# Patient Record
Sex: Male | Born: 1945 | Race: White | Hispanic: No | Marital: Married | Smoking: Never smoker
Health system: Southern US, Community
[De-identification: ages and names within clinical notes are randomized; demographics above are authoritative.]

## PROBLEM LIST (undated history)

## (undated) DIAGNOSIS — E039 Hypothyroidism, unspecified: Secondary | ICD-10-CM

## (undated) DIAGNOSIS — E079 Disorder of thyroid, unspecified: Secondary | ICD-10-CM

## (undated) DIAGNOSIS — M1990 Unspecified osteoarthritis, unspecified site: Secondary | ICD-10-CM

## (undated) DIAGNOSIS — E7800 Pure hypercholesterolemia, unspecified: Secondary | ICD-10-CM

## (undated) DIAGNOSIS — C679 Malignant neoplasm of bladder, unspecified: Secondary | ICD-10-CM

## (undated) DIAGNOSIS — I10 Essential (primary) hypertension: Secondary | ICD-10-CM

---
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
## 1982-08-30 HISTORY — PX: HERNIA REPAIR: SHX51

## 2012-08-30 HISTORY — PX: PENILE PROSTHESIS IMPLANT: SHX240

## 2012-08-30 HISTORY — PX: EYE SURGERY: SHX253

## 2016-08-30 DIAGNOSIS — C801 Malignant (primary) neoplasm, unspecified: Secondary | ICD-10-CM

## 2016-08-30 HISTORY — DX: Malignant (primary) neoplasm, unspecified: C80.1

## 2016-08-30 HISTORY — PX: TRANSURETHRAL RESECTION OF BLADDER TUMOR: SHX2575

## 2019-04-20 LAB — LIPID PANEL
Cholesterol: 152 (ref 0–200)
HDL: 42 (ref 35–70)
LDL Cholesterol: 86
Triglycerides: 138 (ref 40–160)

## 2019-04-20 LAB — HEPATIC FUNCTION PANEL
ALT: 13 (ref 10–40)
AST: 15 (ref 14–40)
Alkaline Phosphatase: 71 (ref 25–125)
Bilirubin, Total: 0.3

## 2019-04-20 LAB — BASIC METABOLIC PANEL
BUN: 17 (ref 4–21)
Creatinine: 1 (ref 0.6–1.3)
Glucose: 100
Potassium: 5.3 (ref 3.4–5.3)
Sodium: 142 (ref 137–147)

## 2019-04-20 LAB — CBC AND DIFFERENTIAL
HCT: 45 (ref 41–53)
Hemoglobin: 14.8 (ref 13.5–17.5)
Platelets: 137 — AB (ref 150–399)
WBC: 5.3

## 2019-04-20 LAB — TSH: TSH: 3.23 (ref 0.41–5.90)

## 2019-04-20 LAB — HEMOGLOBIN A1C: Hemoglobin A1C: 5.8

## 2019-04-26 DIAGNOSIS — N5231 Erectile dysfunction following radical prostatectomy: Secondary | ICD-10-CM | POA: Insufficient documentation

## 2019-04-26 DIAGNOSIS — E669 Obesity, unspecified: Secondary | ICD-10-CM | POA: Insufficient documentation

## 2019-04-26 DIAGNOSIS — E66811 Obesity, class 1: Secondary | ICD-10-CM | POA: Insufficient documentation

## 2019-04-30 NOTE — Progress Notes (Signed)
Uric acid: 6.0

## 2019-05-02 ENCOUNTER — Telehealth: Payer: Self-pay

## 2019-05-02 NOTE — Telephone Encounter (Signed)

## 2019-05-03 ENCOUNTER — Encounter: Payer: Self-pay | Admitting: Family Medicine

## 2019-05-03 ENCOUNTER — Ambulatory Visit (INDEPENDENT_AMBULATORY_CARE_PROVIDER_SITE_OTHER): Payer: Medicare Other | Admitting: Family Medicine

## 2019-05-03 VITALS — BP 130/80 | HR 62 | Ht 72.0 in | Wt 244.0 lb

## 2019-05-03 DIAGNOSIS — D696 Thrombocytopenia, unspecified: Secondary | ICD-10-CM | POA: Insufficient documentation

## 2019-05-03 DIAGNOSIS — Z1211 Encounter for screening for malignant neoplasm of colon: Secondary | ICD-10-CM | POA: Insufficient documentation

## 2019-05-03 DIAGNOSIS — M109 Gout, unspecified: Secondary | ICD-10-CM | POA: Insufficient documentation

## 2019-05-03 DIAGNOSIS — Z23 Encounter for immunization: Secondary | ICD-10-CM | POA: Insufficient documentation

## 2019-05-03 DIAGNOSIS — Z8546 Personal history of malignant neoplasm of prostate: Secondary | ICD-10-CM | POA: Diagnosis not present

## 2019-05-03 NOTE — Progress Notes (Signed)
Established Patient Office Visit  Subjective:  Patient ID: [name], male    DOB: Oct 02, 1945  Age: 73 y.o. MRN: [id number]  CC:  Chief Complaint  Patient presents with  . Establish Care    HPI [name] presents for establishment of care and follow-up for his gout, thrombocytopenia and need for colon cancer screening.  Prostate cancer diagnosed back in 2014 and treated with prostatectomy.  He recurrent covered well.  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  He is scheduled to see a physician in [place] who specializes in this issue.  Patient also has a pump that works well for him.  He has a history of hypertension well-controlled with carvedilol.  History of hypothyroidism controlled with levothyroxine.  He does take this medication in the morning on a fasting stomach.  His physician had ordered labs that I was able to view.  Small drop in the platelets were noted.  Patient has no issues with bleeding.  He is taking allopurinol without issue for his gout.  Has had no gouty attacks since starting this medication.  History reviewed. No pertinent past medical history.  History reviewed. No pertinent surgical history.  History reviewed. No pertinent family history.  Social History   Socioeconomic History  . Marital status: Married    Spouse name: Not on file  . Number of children: Not on file  . Years of education: Not on file  . Highest education level: Not on file  Occupational History  . Not on file  Social Needs  . Financial resource strain: Not on file  . Food insecurity    Worry: Not on file    Inability: Not on file  . Transportation needs    Medical: Not on file    Non-medical: Not on file  Tobacco Use  . Smoking status: Never Smoker  . Smokeless tobacco: Never Used  Substance and Sexual Activity  . Alcohol use: Not on file  . Drug use: Not on file  . Sexual  activity: Not on file  Lifestyle  . Physical activity    Days per week: Not on file    Minutes per session: Not on file  . Stress: Not on file  Relationships  . Social Herbalist on phone: Not on file    Gets together: Not on file    Attends religious service: Not on file    Active member of club or organization: Not on file    Attends meetings of clubs or organizations: Not on file    Relationship status: Not on file  . Intimate partner violence    Fear of current or ex partner: Not on file    Emotionally abused: Not on file    Physically abused: Not on file    Forced sexual activity: Not on file  Other Topics Concern  . Not on file  Social History Narrative  . Not on file    Outpatient Medications Prior to Visit  Medication Sig Dispense Refill  . allopurinol (ZYLOPRIM) 100 MG tablet Take 1 tablet by mouth 2 (two) times daily.    Marland Kitchen aspirin 81 MG chewable tablet Chew 1 tablet by mouth daily.    . carvedilol (COREG) 25 MG tablet Take 1 tablet by mouth 2 (two) times daily.    . Coenzyme Q10 (COQ10 PO) Take by mouth.    . folic acid (FOLVITE) 1 MG tablet  Take 1 tablet by mouth daily.    . indomethacin (INDOCIN) 25 MG capsule Take by mouth as needed.    Marland Kitchen levothyroxine (SYNTHROID) 75 MCG tablet Take 1 tablet by mouth daily.     No facility-administered medications prior to visit.     No Known Allergies  ROS Review of Systems  Constitutional: Negative.   HENT: Negative.   Respiratory: Negative.   Cardiovascular: Negative.   Gastrointestinal: Negative.  Negative for anal bleeding and blood in stool.  Endocrine: Negative for polyphagia and polyuria.  Genitourinary: Negative for decreased urine volume, difficulty urinating and hematuria.  Skin: Negative for pallor and rash.  Allergic/Immunologic: Negative for immunocompromised state.  Hematological: Does not bruise/bleed easily.  Psychiatric/Behavioral: Negative.       Objective:    Physical Exam   Constitutional: He is oriented to person, place, and time. He appears well-developed and well-nourished. No distress.  HENT:  Head: Normocephalic and atraumatic.  Right Ear: External ear normal.  Left Ear: External ear normal.  Mouth/Throat: Oropharynx is clear and moist. No oropharyngeal exudate.  Eyes: Pupils are equal, round, and reactive to light. Conjunctivae are normal. Right eye exhibits no discharge. Left eye exhibits no discharge. No scleral icterus.  Neck: Neck supple. No JVD present. No tracheal deviation present. No thyromegaly present.  Cardiovascular: Normal rate, regular rhythm and normal heart sounds.  Pulmonary/Chest: Effort normal and breath sounds normal. No stridor.  Abdominal: Bowel sounds are normal.  Musculoskeletal:        General: No edema.  Lymphadenopathy:    He has no cervical adenopathy.  Neurological: He is alert and oriented to person, place, and time.  Skin: Skin is warm and dry. He is not diaphoretic.  Psychiatric: He has a normal mood and affect.    BP 130/80   Pulse 62   Ht 6' (1.829 m)   Wt 244 lb (110.7 kg)   SpO2 97%   BMI 33.09 kg/m  Wt Readings from Last 3 Encounters:  05/03/19 244 lb (110.7 kg)   BP Readings from Last 3 Encounters:  05/03/19 130/80   Guideline developer:  UpToDate (see UpToDate for funding source) Date Released: June 2014  Health Maintenance Due  Topic Date Due  . Hepatitis C Screening  Jun 04, 1946  . TETANUS/TDAP  02/02/1965  . COLONOSCOPY  02/03/1996  . PNA vac Low Risk Adult (1 of 2 - PCV13) 02/03/2011    There are no preventive care reminders to display for this patient.  Lab Results  Component Value Date   TSH 3.23 04/20/2019   Lab Results  Component Value Date   WBC 5.3 04/20/2019   HGB 14.8 04/20/2019   HCT 45 04/20/2019   PLT 137 (A) 04/20/2019   Lab Results  Component Value Date   NA 142 04/20/2019   K 5.3 04/20/2019   BUN 17 04/20/2019   CREATININE 1.0 04/20/2019   ALKPHOS 71 04/20/2019    AST 15 04/20/2019   ALT 13 04/20/2019   Lab Results  Component Value Date   CHOL 152 04/20/2019   Lab Results  Component Value Date   HDL 42 04/20/2019   Lab Results  Component Value Date   LDLCALC 86 04/20/2019   Lab Results  Component Value Date   TRIG 138 04/20/2019   No results found for: [facility] Lab Results  Component Value Date   HGBA1C 5.8 04/20/2019      Assessment & Plan:   Problem List Items Addressed This Visit  Other   Thrombocytopenia ([place])   Relevant Orders   CBC   Gout - Primary   Relevant Orders   Uric acid   Screen for colon cancer   Relevant Orders   Ambulatory referral to Gastroenterology   Personal history of prostate cancer   Relevant Orders   PSA   Need for influenza vaccination   Relevant Orders   Flu Vaccine QUAD High Dose(Fluad) (Completed)      No orders of the defined types were placed in this encounter.   Follow-up: Return in about 3 months (around 08/02/2019).

## 2019-05-04 LAB — CBC
HCT: 43.1 % (ref 39.0–52.0)
Hemoglobin: 14.5 g/dL (ref 13.0–17.0)
MCHC: 33.6 g/dL (ref 30.0–36.0)
MCV: 90.5 fl (ref 78.0–100.0)
Platelets: 148 10*3/uL — ABNORMAL LOW (ref 150.0–400.0)
RBC: 4.76 Mil/uL (ref 4.22–5.81)
RDW: 15.5 % (ref 11.5–15.5)
WBC: 5.3 10*3/uL (ref 4.0–10.5)

## 2019-05-04 LAB — URIC ACID: Uric Acid, Serum: 6.4 mg/dL (ref 4.0–7.8)

## 2019-05-04 LAB — PSA: PSA: 0 ng/mL — ABNORMAL LOW (ref 0.10–4.00)

## 2019-05-09 ENCOUNTER — Other Ambulatory Visit: Payer: Self-pay

## 2019-05-09 MED ORDER — LEVOTHYROXINE SODIUM 75 MCG PO TABS: 75.0000 ug | ORAL_TABLET | Freq: Every day | ORAL | 2 refills | Status: DC

## 2019-05-09 MED ORDER — CARVEDILOL 25 MG PO TABS: 25.0000 mg | ORAL_TABLET | Freq: Two times a day (BID) | ORAL | 2 refills | Status: DC

## 2019-05-09 MED ORDER — ALLOPURINOL 100 MG PO TABS: 100.0000 mg | ORAL_TABLET | Freq: Two times a day (BID) | ORAL | 2 refills | Status: DC

## 2019-05-09 MED ORDER — FOLIC ACID 1 MG PO TABS: 1.0000 mg | ORAL_TABLET | Freq: Every day | ORAL | 2 refills | Status: DC

## 2019-05-17 ENCOUNTER — Telehealth: Payer: Self-pay

## 2019-05-17 MED ORDER — ROSUVASTATIN CALCIUM 10 MG PO TABS: 10.0000 mg | ORAL_TABLET | Freq: Every day | ORAL | 1 refills | Status: DC

## 2019-05-17 NOTE — Telephone Encounter (Signed)
Rx changed & sent to [facility] on [address].

## 2019-05-17 NOTE — Telephone Encounter (Signed)
Copied from [place] [id number]. Topic: General - Other >> May 17, 2019  3:40 PM Mcneil, Ja-Kwan wrote: Reason for CRM: Pt stated he needs a new Rx for Rosuvastatin 10 MG that he takes once daily sent to [facility].

## 2019-05-17 NOTE — Telephone Encounter (Signed)
Patient wanting script sent to [place], [place] [address], pharmacy instead. Please advise.

## 2019-06-18 ENCOUNTER — Other Ambulatory Visit: Payer: Self-pay

## 2019-06-18 DIAGNOSIS — Z20822 Contact with and (suspected) exposure to covid-19: Secondary | ICD-10-CM

## 2019-06-20 LAB — NOVEL CORONAVIRUS, NAA: SARS-CoV-2, NAA: NOT DETECTED

## 2019-09-02 ENCOUNTER — Emergency Department (HOSPITAL_COMMUNITY)
Admission: EM | Admit: 2019-09-02 | Discharge: 2019-09-02 | Discharge status: Against Medical Advice | Payer: Medicare Other | Attending: Emergency Medicine | Admitting: Emergency Medicine

## 2019-09-02 ENCOUNTER — Other Ambulatory Visit: Payer: Self-pay

## 2019-09-02 ENCOUNTER — Encounter (HOSPITAL_COMMUNITY): Payer: Self-pay | Admitting: Emergency Medicine

## 2019-09-02 DIAGNOSIS — Z5321 Procedure and treatment not carried out due to patient leaving prior to being seen by health care provider: Secondary | ICD-10-CM | POA: Insufficient documentation

## 2019-09-02 DIAGNOSIS — R319 Hematuria, unspecified: Secondary | ICD-10-CM | POA: Diagnosis present

## 2019-09-02 HISTORY — DX: Pure hypercholesterolemia, unspecified: E78.00

## 2019-09-02 HISTORY — DX: Essential (primary) hypertension: I10

## 2019-09-02 HISTORY — DX: Disorder of thyroid, unspecified: E07.9

## 2019-09-02 NOTE — ED Notes (Signed)
No response for room x3 

## 2019-09-02 NOTE — ED Notes (Signed)
Prior to inserting foley catheter pt was able to void unable able measure or obtain urine sample. Bladder scan preformed post void.

## 2019-09-02 NOTE — ED Notes (Signed)
Pt states he will wait for now in lobby.

## 2019-09-02 NOTE — ED Triage Notes (Signed)
C/o intermittent hematuria and urinary retention x 2 weeks. Worse this morning. Pt has appt with urologist on Friday.

## 2019-09-07 DIAGNOSIS — N3040 Irradiation cystitis without hematuria: Secondary | ICD-10-CM | POA: Insufficient documentation

## 2019-09-28 ENCOUNTER — Ambulatory Visit: Payer: Medicare Other

## 2019-10-05 ENCOUNTER — Encounter (HOSPITAL_BASED_OUTPATIENT_CLINIC_OR_DEPARTMENT_OTHER): Payer: Medicare Other | Admitting: Internal Medicine

## 2019-10-05 ENCOUNTER — Other Ambulatory Visit: Payer: Self-pay

## 2019-10-05 DIAGNOSIS — N3011 Interstitial cystitis (chronic) with hematuria: Secondary | ICD-10-CM | POA: Diagnosis not present

## 2019-10-05 DIAGNOSIS — N3041 Irradiation cystitis with hematuria: Secondary | ICD-10-CM | POA: Insufficient documentation

## 2019-10-05 DIAGNOSIS — Z8546 Personal history of malignant neoplasm of prostate: Secondary | ICD-10-CM | POA: Diagnosis not present

## 2019-10-05 NOTE — Progress Notes (Addendum)
HIRO, VIPOND ([id number]) Visit Report for 10/05/2019 Chief Complaint Document Details Patient Name: Date of Service: [name], [name] 10/05/2019 1:15 PM Medical Record [id number] Patient Account Number: [id number] Date of Birth/Sex: Treating RN: [birth year] (74 y.o. 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 M) Primary Care Provider: [name] Other Clinician: Referring Provider: Treating Provider/Extender:Letetia Romanello, [name], [name] in Treatment: 0 History of Present Illness HPI Description: ADMISSION 10/05/2019 This is a 74 year old man who was diagnosed with prostate cancer in 2000 and underwent a radical prostatectomy. Because of elevated prostate-specific antigen he required salvage radiation in 2004. His radiation therapy was at [facility] in [place] at the time of this dictation I do not have the exact treatment specifications. 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 According to the patient his problem began 60 days ago. He developed gross hematuria and more problematic than that blood clots with urinary retention. He  follows with Dr. Warrick Parisian at [facility] urology. I believe he required Foley catheter insertion for a period of time although in the last few weeks he has not noticed any further bleeding. He is incontinent for the most part during the day and wears incontinence garments. He does not have any dysuria. The patient has been worked up with a CT urogram on 09/20/2019. 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 There is intermediate nonocclusive filling defects within the left mid ureter nonobstructing bilateral renal calculi. The patient has had urodynamic studies he has a overactivity with a decent bladder capacity. The patient's last cystoscopy was on 07/04/2019 which did not show any evidence of stones or foreign bodies. Bilateral ureteral orifices were noted in the usual anatomic position without abnormalities. 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 The patient was also seen by Dr. Zigmund Daniel at the [facility] wound care and hyperbaric center. He was recommended for hyperbaric oxygen therapy over the patient lives in [place] and wishes to come here as it is much closer to his home. The patient does not have any known history of coronary artery disease or significant pulmonary disease. He is a non-smoker. Past medical history includes prostate CA is noted radiation cystitis is noted. hypertension and hyperlipidemia Electronic Signature(s) Signed: 10/05/2019 5:45:44 PM By: [name] MD Entered By: [name] on 10/05/2019 15:46:58 -------------------------------------------------------------------------------- Physical Exam Details Patient Name: Date of Service: [name], [name] 10/05/2019 1:15 PM Medical Record [id number] Patient Account Number: [id number] Date of Birth/Sex: Treating RN: 1945/09/06 (74 y.o. M) Primary Care  Provider: [name] Other Clinician: Referring Provider: Treating Provider/Extender:Tyriana Helmkamp, [name], [name] in Treatment: 0 Constitutional Patient is hypertensive.. Pulse regular and within target range for patient.Marland Kitchen Respirations regular, non-labored and within target range.. Temperature is normal and within the target range for the patient.Marland Kitchen Appears in no distress. Respiratory  work of breathing is normal. Cardiovascular Heart rhythm and rate regular, without murmur or gallop.. Genitourinary (GU) Bladder is not distended. Psychiatric appears at normal baseline. Electronic Signature(s) Signed: 10/05/2019 5:45:44 PM By: [name] MD Entered By: [name] on 10/05/2019 15:43:07 -------------------------------------------------------------------------------- Physician Orders Details Patient Name: Date of Service: [name], [name] 10/05/2019 1:15 PM Medical Record [id number] Patient Account Number: [id number] Date of Birth/Sex: Treating RN: 06-Oct-1945 (74 y.o. [name] Primary Care Provider: [name] Other Clinician: Referring Provider: Treating Provider/Extender:Getsemani Lindon, [name], [name] in Treatment: 0 Verbal / Phone Orders: No Diagnosis Coding Follow-up Appointments Other: - We will contact you to start Hyperbarics once we get approval from insurance Hyperbaric Oxygen Therapy Evaluate for HBO Therapy Indication: - Radiation Cystitis If appropriate for treatment, begin HBOT per protocol: 2.5 ATA for 90 Minutes with 2 Five (5) Minute Air Breaks Total Number of Treatments: - 40 One treatments per day (delivered Monday through Friday unless otherwise specified in Special Instructions below): Antihistamine 30 minutes prior to HBO Treatment, difficulty clearing ears. Electronic Signature(s) Signed: 10/05/2019 5:45:44 PM By: [name] MD Signed: 10/05/2019 5:49:59 PM By: Kela Millin Entered By: Kela Millin on 10/05/2019 14:54:33 -------------------------------------------------------------------------------- Problem List Details Patient Name: Date of Service: [name], [name] 10/05/2019 1:15 PM Medical Record [id number] Patient Account Number: [id number] Date of Birth/Sex: Treating RN: Apr 06, 1946 (74 y.o. M) Primary Care Provider: [name] Other Clinician: Referring Provider: Treating Provider/Extender:Arryanna Holquin, [name], [name] in Treatment: 0 Active Problems ICD-10 Evaluated Encounter Code Description Active Date Today Diagnosis N30.41 Irradiation cystitis with hematuria 10/05/2019 No Yes Z51.0 Encounter for antineoplastic radiation therapy 10/05/2019 No Yes Z85.46 Personal history of malignant neoplasm of prostate 10/05/2019 No Yes Inactive Problems Resolved Problems Electronic Signature(s) Signed: 10/05/2019 5:45:44 PM By: [name] MD Entered By: [name] on 10/05/2019 15:20:54 -------------------------------------------------------------------------------- Progress Note Details Patient Name: Date of Service: [name], [name] 10/05/2019 1:15 PM Medical Record [id number] Patient Account Number: [id number] Date of Birth/Sex: Treating RN: 1946-08-17 (74 y.o. 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 Because of elevated prostate-specific antigen he required salvage radiation in 2004. His radiation therapy was at [facility] in [place] at the time of this dictation I do not have the exact treatment  specifications. 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 According to the patient his problem began 60 days ago. He developed gross hematuria and more problematic than that blood clots with urinary retention. He follows with Dr. Warrick Parisian at [facility] urology. I believe he required Foley catheter insertion for a period of time although in the last few weeks he has not noticed any further bleeding. He is incontinent for the most part during the day and wears incontinence garments. He does not have any dysuria. The patient has been worked up with a CT urogram on 09/20/2019. 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 There is intermediate nonocclusive filling defects within the left mid ureter nonobstructing bilateral renal  calculi. The patient has had urodynamic studies he has a overactivity with a decent bladder capacity. The patient's last cystoscopy was on 07/04/2019 which did not show any evidence of stones or foreign bodies. Bilateral ureteral orifices were noted in the usual anatomic position without abnormalities. 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 The patient was also seen by Dr. Zigmund Daniel at the [facility] wound care and hyperbaric center. He was recommended for hyperbaric oxygen therapy over the patient lives in [place] and wishes to come here as it is much closer to his home. The patient does not have any known history of coronary artery disease or significant pulmonary disease. He is a non-smoker. Past medical history includes prostate CA is noted radiation cystitis is noted. hypertension and hyperlipidemia Patient History Information obtained from  Patient. Allergies No Known Allergies Family History Heart Disease - Father, Stroke - Mother, No family history of Cancer, Hereditary Spherocytosis, Hypertension, Kidney Disease, Lung Disease, Seizures, Thyroid Problems, Tuberculosis. Social History Never smoker, Marital Status - Married, Alcohol Use - Rarely, Caffeine Use - Never. Medical History Eyes Denies history of Cataracts, Glaucoma, Optic Neuritis Ear/Nose/Mouth/Throat Denies history of Chronic sinus problems/congestion, Middle ear problems Hematologic/Lymphatic Denies history of Anemia, Hemophilia, Human Immunodeficiency Virus, Lymphedema, Sickle Cell Disease Respiratory Denies history of Aspiration, Asthma, Chronic Obstructive Pulmonary Disease (COPD), Pneumothorax, Sleep Apnea, Tuberculosis Cardiovascular Patient has history of Hypertension Denies history of Angina, Arrhythmia, Congestive Heart Failure, Coronary Artery Disease, Deep Vein Thrombosis, Hypotension, Myocardial Infarction, Peripheral Arterial Disease, Peripheral Venous Disease, Phlebitis, Vasculitis Gastrointestinal Denies history of Cirrhosis , Colitis, Crohnoos, Hepatitis A, Hepatitis B, Hepatitis C Endocrine Denies history of Type I Diabetes, Type II Diabetes Genitourinary Denies history of End Stage Renal Disease Immunological Denies history of Lupus Erythematosus, Raynaudoos, Scleroderma Integumentary (Skin) Denies history of History of Burn Musculoskeletal Denies history of Gout, Rheumatoid Arthritis, Osteoarthritis, Osteomyelitis Neurologic Denies history of Dementia, Neuropathy, Quadriplegia, Paraplegia, Seizure Disorder Oncologic Patient has history of Received Radiation - 2003 Psychiatric Denies history of Anorexia/bulimia, Confinement Anxiety Review of Systems (ROS) Constitutional Symptoms (General Health) Denies complaints or symptoms of Fatigue, Fever, Chills, Marked Weight Change. Eyes Complains or has symptoms of Glasses /  Contacts. Denies complaints or symptoms of Dry Eyes, Vision Changes. Ear/Nose/Mouth/Throat Denies complaints or symptoms of Chronic sinus problems or rhinitis. Respiratory Denies complaints or symptoms of Chronic or frequent coughs, Shortness of Breath. Cardiovascular Denies complaints or symptoms of Chest pain. Gastrointestinal Denies complaints or symptoms of Frequent diarrhea, Nausea, Vomiting. Endocrine Denies complaints or symptoms of Heat/cold intolerance. Genitourinary Denies complaints or symptoms of Frequent urination. Integumentary (Skin) Denies complaints or symptoms of Wounds. Musculoskeletal Denies complaints or symptoms of Muscle Pain, Muscle Weakness. Neurologic Denies complaints or symptoms of Numbness/parasthesias. Psychiatric Denies complaints or symptoms of Claustrophobia, Suicidal. Objective Constitutional Patient is hypertensive.. Pulse regular and within target range for patient.Marland Kitchen Respirations regular, non-labored and within target range.. Temperature is normal and within the target range for the patient.Marland Kitchen Appears in no distress. Vitals Time Taken: 1:50 PM, Height: 72 in, Source: Stated, Weight: 240 lbs, Source: Stated, BMI: 32.5, Temperature: 98.5 F, Pulse: 60 bpm, Respiratory Rate: 18 breaths/min, Blood Pressure: 159/64 mmHg. Respiratory work of breathing is normal. Cardiovascular Heart rhythm and rate regular, without murmur or gallop.. Genitourinary (GU) Bladder is not distended. Psychiatric appears at normal baseline. Assessment Active Problems ICD-10 Irradiation cystitis with hematuria Encounter for antineoplastic radiation therapy Personal history of malignant neoplasm of prostate HBO Evaluation Hx of  prior radiation This patient has a history of a radical prostatectomy in 2000 for prostate cancer. He developed an elevated PSA and required salvage radiation in 2004. At the time of this dictation we do not have the specifics of his radiation  history which were done in [place]. Location of STRN Patient has radiation cystitis reasonably completely evaluated by his urologist Dr.Matvey Tsivian at [facility]. He has been referred for that purpose for hyperbaric oxygen therapy. Description of symptoms According to the patient he developed gross hematuria about 60 days ago. More problematic than that the he had blood clots with urinary retention. He required Foley catheter insertion for a period of time but that since been removed. The patient does not have any dysuria. He wears incontinence garments for incontinent voiding but that is not a new phenomenon CT scan results CT of the abdomen and pelvis done on 09/06/2019 showed circumferential bladder wall thickening without focality which could be related to radiation cystitis. Also noted nonobstructing bilateral renal calculi Other tests Patient underwent a cystoscopy on 07/04/2019. 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 There was no evidence of any stones or foreign bodies. Bilateral ureteral orifices were in the usual anatomic position with clear yellow urine. Plan of care/Summary This patient had recent onset of gross hematuria with blood clots and urinary obstruction. He has been evaluated with a cystoscopy and CT scan of the abdomen and pelvis [urogram] he has been referred for hyperbaric oxygen. We plan to initiate treatment with 2.5 atm 90-minute treatments with 5-minute air breaks for 40 treatments. Goal of this will be to eliminate gross hematuria and resultant symptoms of urinary obstruction. The patient voids incontinently and wears incontinence garments it is doubtful that we will see any improvement there. Plan Follow-up Appointments: Other: - We will contact you to start Hyperbarics once we get approval from insurance Hyperbaric Oxygen Therapy: Evaluate for HBO Therapy Indication: - Radiation Cystitis If appropriate for treatment, begin HBOT  per protocol: 2.5 ATA for 90 Minutes with 2 Five (5) Minute Air Breaks Total Number of Treatments: - 40 One treatments per day (delivered Monday through Friday unless otherwise specified in Special Instructions below): Antihistamine 30 minutes prior to HBO Treatment, difficulty clearing ears. 1. The patient is a good candidate for hyperbaric oxygen therapy for radiation cystitis of recent onset. He has been reasonably well evaluated with CT urogram and cystoscopy. He does not have another source of his symptomatology. 2. As such I recommend hyperbaric oxygen therapy at 2.5 atm 100% oxygen for 90-minute treatments x40 3. I have discussed possible side effects of hyperbaric oxygen including tympanic membrane barotrauma, minor of refractive issues for vision, rare complications including oxygen toxicity. The patient wishes to proceed 4. Goal of therapy will be eradication of hematuria. The patient voids incontinently so it will be difficult to determine urinary frequency and other lower urinary tract voiding symptomatology. 5. He will need a chest x-ray if he is not had one within the last 6 months. As he has no limitations and no history of coronary artery disease I do not necessarily think an EKG is necessary 6. The patient will continue to be followed closely by urology at [facility]. The consult to initiate hyperbaric oxygen treatment was initiated at their request I spent 35 minutes on the review of available records, face to face evaluation and preparation of this record Electronic Signature(s) Signed: 10/09/2019 5:11:57 PM By: [name] MD Previous Signature: 10/09/2019 8:06:10 AM Version By: [name]  MD Previous Signature: 10/05/2019 5:45:44 PM Version By: [name] MD Entered By: [name] on 10/09/2019 17:11:57 -------------------------------------------------------------------------------- HxROS Details Patient Name: Date of Service: [name], [name] 10/05/2019 1:15  PM Medical Record [id number] Patient Account Number: [id number] Date of Birth/Sex: Treating RN: 1946/07/30 (73 y.o. Oval Linsey Primary Care Provider: [name] Other Clinician: Referring Provider: Treating Provider/Extender:Brizeida Mcmurry, [name], [name] in Treatment: 0 Information Obtained From Patient Constitutional Symptoms (General Health) Complaints and Symptoms: Negative for: Fatigue; Fever; Chills; Marked Weight Change Eyes Complaints and Symptoms: Positive for: Glasses / Contacts Negative for: Dry Eyes; Vision Changes Medical History: Negative for: Cataracts; Glaucoma; Optic Neuritis Ear/Nose/Mouth/Throat Complaints and Symptoms: Negative for: Chronic sinus problems or rhinitis Medical History: Negative for: Chronic sinus problems/congestion; Middle ear problems Respiratory Complaints and Symptoms: Negative for: Chronic or frequent coughs; Shortness of Breath Medical History: Negative for: Aspiration; Asthma; Chronic Obstructive Pulmonary Disease (COPD); Pneumothorax; Sleep Apnea; Tuberculosis Cardiovascular Complaints and Symptoms: Negative for: Chest pain Medical History: Positive for: Hypertension Negative for: Angina; Arrhythmia; Congestive Heart Failure; Coronary Artery Disease; Deep Vein Thrombosis; Hypotension; Myocardial Infarction; Peripheral Arterial Disease; Peripheral Venous Disease; Phlebitis; Vasculitis Gastrointestinal Complaints and Symptoms: Negative for: Frequent diarrhea; Nausea; Vomiting Medical History: Negative for: Cirrhosis ; Colitis; Crohns; Hepatitis A; Hepatitis B; Hepatitis C Endocrine Complaints and Symptoms: Negative for: Heat/cold intolerance Medical History: Negative for: Type I Diabetes; Type II Diabetes Genitourinary Complaints and Symptoms: Negative for: Frequent urination Medical History: Negative for: End Stage Renal Disease Integumentary (Skin) Complaints and Symptoms: Negative for:  Wounds Medical History: Negative for: History of Burn Musculoskeletal Complaints and Symptoms: Negative for: Muscle Pain; Muscle Weakness Medical History: Negative for: Gout; Rheumatoid Arthritis; Osteoarthritis; Osteomyelitis Neurologic Complaints and Symptoms: Negative for: Numbness/parasthesias Medical History: Negative for: Dementia; Neuropathy; Quadriplegia; Paraplegia; Seizure Disorder Psychiatric Complaints and Symptoms: Negative for: Claustrophobia; Suicidal Medical History: Negative for: Anorexia/bulimia; Confinement Anxiety Hematologic/Lymphatic Medical History: Negative for: Anemia; Hemophilia; Human Immunodeficiency Virus; Lymphedema; Sickle Cell Disease Immunological Medical History: Negative for: Lupus Erythematosus; Raynauds; Scleroderma Oncologic Medical History: Positive for: Received Radiation - 2003 Immunizations Pneumococcal Vaccine: Received Pneumococcal Vaccination: No Implantable Devices None Family and Social History Cancer: No; Heart Disease: Yes - Father; Hereditary Spherocytosis: No; Hypertension: No; Kidney Disease: No; Lung Disease: No; Seizures: No; Stroke: Yes - Mother; Thyroid Problems: No; Tuberculosis: No; Never smoker; Marital Status - Married; Alcohol Use: Rarely; Caffeine Use: Never; Financial Concerns: No; Food, Clothing or Shelter Needs: No; Support System Lacking: No; Transportation Concerns: No Electronic Signature(s) Signed: 10/05/2019 5:25:54 PM By: [name] RN Signed: 10/05/2019 5:45:44 PM By: [name] MD Entered By: [name] on 10/05/2019 13:58:17 -------------------------------------------------------------------------------- SuperBill Details Patient Name: Date of Service: [name], [name] 10/05/2019 Medical Record [id number] Patient Account Number: [id number] Date of Birth/Sex: Treating RN: Dec 16, 1945 (74 y.o. [name] Primary Care Provider: [name] Other Clinician: Referring Provider:  Treating Provider/Extender:Zayveon Raschke, [name], [name] in Treatment: 0 Diagnosis Coding ICD-10 Codes Code Description N30.41 Irradiation cystitis with hematuria Z51.0 Encounter for antineoplastic radiation therapy Z85.46 Personal history of malignant neoplasm of prostate Facility Procedures CPT4 Code: [id number] Description: [place] VISIT-LEV 3 EST PT Modifier: Quantity: 1 Physician Procedures CPT4 Code: [id number] Description: WC PHYS LEVEL 3 NEW PT ICD-10 Diagnosis Description N30.41 Irradiation cystitis with hematuria Z51.0 Encounter for antineoplastic radiation therapy Z85.46 Personal history of malignant neoplasm of prost Modifier: ate Quantity: 1 Electronic Signature(s) Signed: 10/05/2019 5:45:44 PM By: [name] MD Entered By: [name] on 10/05/2019 15:45:45

## 2019-10-05 NOTE — Progress Notes (Signed)
[name], [name] ([id number]) Visit Report for 10/05/2019 Abuse/Suicide Risk Screen Details Patient Name: Date of Service: [name], [name] 10/05/2019 1:15 PM Medical Record [id number] Patient Account Number: [id number] Date of Birth/Sex: Treating RN: 05-04-1946 (73 y.o. [name]) [name] Primary Care Shakema Surita: [name] Other Clinician: Referring Quincy Boy: Treating Shavy Beachem/Extender:[name], [name], [name] in Treatment: 0 Abuse/Suicide Risk Screen Items Answer ABUSE RISK SCREEN: Has anyone close to you tried to hurt or harm you recentlyo No Do you feel uncomfortable with anyone in your familyo No Has anyone forced you do things that you didnt want to doo No Electronic Signature(s) Signed: 10/05/2019 5:25:54 PM By: [name] RN Entered By: [name] on 10/05/2019 13:58:28 -------------------------------------------------------------------------------- Activities of Daily Living Details Patient Name: Date of Service: [name], [name] 10/05/2019 1:15 PM Medical Record [id number] Patient Account Number: [id number] Date of Birth/Sex: Treating RN: 29-Jan-1946 (73 y.o. [name]) [name] Primary Care Naleigha Raimondi: [name] Other Clinician: Referring Hovanes Hymas: Treating Boneta Standre/Extender:[name], [name], [name] in Treatment: 0 Activities of Daily Living Items Answer Activities of Daily Living (Please select one for each item) Drive Automobile Completely Able Take Medications Completely Able Use Telephone Completely Able Care for Appearance Completely Able Use Toilet Completely Able Bath / Shower Completely Able Dress Self Completely Able Feed Self Completely Able Walk Completely Able Get In / Out Bed Completely Able Housework Completely Able Prepare Meals Completely Able Handle Money Completely Able Shop for Self Completely Able Electronic Signature(s) Signed: 10/05/2019 5:25:54 PM By: [name] RN Entered By: [name] on 10/05/2019  13:58:52 -------------------------------------------------------------------------------- Education Screening Details Patient Name: Date of Service: [name], [name] 10/05/2019 1:15 PM Medical Record [id number] Patient Account Number: [id number] Date of Birth/Sex: Treating RN: 27-Jul-1946 (73 y.o. [name]) [name] Primary Care Collin Rengel: [name] Other Clinician: Referring Aldeen Riga: Treating Lucrecia Mcphearson/Extender:[name], [name], [name] in Treatment: 0 Learning Preferences/Education Level/Primary Language Learning Preference: Explanation Highest Education Level: College or Above Preferred Language: English Cognitive Barrier Language Barrier: No Translator Needed: No Memory Deficit: No Emotional Barrier: No Cultural/Religious Beliefs Affecting Medical Care: No Physical Barrier Impaired Vision: Yes Glasses Impaired Hearing: No Decreased Hand dexterity: No Knowledge/Comprehension Knowledge Level: High Comprehension Level: High Ability to understand written High instructions: Ability to understand verbal High instructions: Motivation Anxiety Level: Calm Cooperation: Cooperative Education Importance: Acknowledges Need Interest in Health Problems: Asks Questions Perception: Coherent Willingness to Engage in Self- High Management Activities: Readiness to Engage in Self- High Management Activities: Electronic Signature(s) Signed: 10/05/2019 5:25:54 PM By: [name] RN Entered By: [name] on 10/05/2019 13:59:26 -------------------------------------------------------------------------------- Fall Risk Assessment Details Patient Name: Date of Service: [name], [name] 10/05/2019 1:15 PM Medical Record [id number] Patient Account Number: [id number] Date of Birth/Sex: Treating RN: [birth year] (73 y.o. [name]) [name] Primary Care Nema Oatley: [name] Other Clinician: Referring Greg Eckrich: Treating Indira Sorenson/Extender:[name], [name],  [name] in Treatment: 0 Fall Risk Assessment Items Have you had 2 or more falls in the last 12 monthso 0 No Have you had any fall that resulted in injury in the last 12 monthso 0 No FALLS RISK SCREEN History of falling - immediate or within 3 months 0 No Secondary diagnosis (Do you have 2 or more medical diagnoseso) 0 No Ambulatory aid None/bed rest/wheelchair/nurse 0 No Crutches/cane/walker 0 No Furniture 0 No Intravenous therapy Access/Saline/Heparin Lock 0 No Weak (short steps with or without shuffle, stooped but able to lift head 0 No while walking, may seek support from furniture) Impaired (short steps with shuffle, may have difficulty arising from chair, 0 No head down,  impaired balance) Mental Status Oriented to own ability 0 No Overestimates or forgets limitations 0 No Risk Level: Low Risk Score: 0 Electronic Signature(s) Signed: 10/05/2019 5:25:54 PM By: [name] RN Entered By: [name] on 10/05/2019 13:59:35 -------------------------------------------------------------------------------- Nutrition Risk Screening Details Patient Name: Date of Service: [name], [name] 10/05/2019 1:15 PM Medical Record [id number] Patient Account Number: [id number] Date of Birth/Sex: Treating RN: 09/22/1945 (73 y.o. [name]) [name] Primary Care Sallye Lunz: [name] Other Clinician: Referring Lani Mendiola: Treating Jacob Chamblee/Extender:[name], [name], [name] in Treatment: 0 Height (in): 72 Weight (lbs): 240 Body Mass Index (BMI): 32.5 Nutrition Risk Screening Items Score Screening NUTRITION RISK SCREEN: I have an illness or condition that made me change the kind and/or 0 No amount of food I eat I eat fewer than two meals per day 0 No I eat few fruits and vegetables, or milk products 0 No I have three or more drinks of beer, liquor or wine almost every day 0 No I have tooth or mouth problems that make it hard for me to eat 0 No I don't always have enough  money to buy the food I need 0 No I eat alone most of the time 0 No I take three or more different prescribed or over-the-counter drugs a day 1 Yes 0 No Without wanting to, I have lost or gained 10 pounds in the last six months I am not always physically able to shop, cook and/or feed myself 0 No Nutrition Protocols Good Risk Protocol 0 No interventions needed Moderate Risk Protocol High Risk Proctocol Risk Level: Good Risk Score: 1 Electronic Signature(s) Signed: 10/05/2019 5:25:54 PM By: [name] RN Entered By: [name] on 10/05/2019 14:00:10

## 2019-10-05 NOTE — Progress Notes (Signed)
[name], [name] ([id number]) Visit Report for 10/05/2019 Allergy List Details Patient Name: Date of Service: [name], [name] 10/05/2019 1:15 PM Medical Record [id number] Patient Account Number: [id number] Date of Birth/Sex: Treating RN: 08-24-1946 (73 y.o. [name]) [name] Primary Care Nanci Lakatos: [name] Other Clinician: Referring Nocole Zammit: Treating Zacheriah Stumpe/Extender:[name], [name], [name] in Treatment: 0 Allergies Active Allergies No Known Allergies Allergy Notes Electronic Signature(s) Signed: 10/05/2019 5:25:54 PM By: [name] RN Entered By: [name] on 10/05/2019 13:51:17 -------------------------------------------------------------------------------- [place] Details Patient Name: Date of Service: [name], [name] 10/05/2019 1:15 PM Medical Record [id number] Patient Account Number: [id number] Date of Birth/Sex: Treating RN: 14-Feb-1946 (73 y.o. [name]) [name] Primary Care Darcella Shiffman: [name] Other Clinician: Referring Reginaldo Hazard: Treating Ausencio Vaden/Extender:[name], [name], [name] in Treatment: 0 Visit Information Patient Arrived: Ambulatory Arrival Time: 13:49 Accompanied By: self Transfer Assistance: None Patient Identification Verified: Yes Secondary Verification Process Completed: Yes Patient Requires Transmission-Based No Precautions: Patient Has Alerts: No Electronic Signature(s) Signed: 10/05/2019 5:25:54 PM By: [name] RN Entered By: [name] on 10/05/2019 13:50:25 -------------------------------------------------------------------------------- Clinic Level of Care Assessment Details Patient Name: Date of Service: [name], [name] 10/05/2019 1:15 PM Medical Record [id number] Patient Account Number: [id number] Date of Birth/Sex: Treating RN: [birth year] (74 y.o. [name] Primary Care Breccan Galant: [name] Other Clinician: Referring Charnette Younkin: Treating Dyshawn Cangelosi/Extender:[name],  [name], [name] in Treatment: 0 Clinic Level of Care Assessment Items TOOL 4 Quantity Score X - Use when only an EandM is performed on FOLLOW-UP visit 1 0 ASSESSMENTS - Nursing Assessment / Reassessment X - Reassessment of Co-morbidities (includes updates in patient status) 1 10 X - Reassessment of Adherence to Treatment Plan 1 5 ASSESSMENTS - Wound and Skin Assessment / Reassessment []  - Simple Wound Assessment / Reassessment - one wound 0 []  - Complex Wound Assessment / Reassessment - multiple wounds 0 []  - Dermatologic / Skin Assessment (not related to wound area) 0 ASSESSMENTS - Focused Assessment []  - Circumferential Edema Measurements - multi extremities 0 []  - Nutritional Assessment / Counseling / Intervention 0 []  - Lower Extremity Assessment (monofilament, tuning fork, pulses) 0 []  - Peripheral Arterial Disease Assessment (using hand held doppler) 0 ASSESSMENTS - Ostomy and/or Continence Assessment and Care X - Incontinence Assessment and Management 1 10 []  - Ostomy Care Assessment and Management (repouching, etc.) 0 PROCESS - Coordination of Care []  - Simple Patient / Family Education for ongoing care 0 X - Complex (extensive) Patient / Family Education for ongoing care 1 20 X - Staff obtains Programmer, systems, Records, Test Results / Process Orders 1 10 []  - Staff telephones HHA, Nursing Homes / Clarify orders / etc 0 []  - Routine Transfer to another Facility (non-emergent condition) 0 []  - Routine Hospital Admission (non-emergent condition) 0 X - New Admissions / Biomedical engineer / Ordering NPWT, Apligraf, etc. 1 15 []  - Emergency Hospital Admission (emergent condition) 0 []  - Simple Discharge Coordination 0 X - Complex (extensive) Discharge Coordination 1 15 PROCESS - Special Needs []  - Pediatric / Minor Patient Management 0 []  - Isolation Patient Management 0 []  - Hearing / Language / Visual special needs 0 []  - Assessment of Community assistance  (transportation, D/C planning, etc.) 0 []  - Additional assistance / Altered mentation 0 []  - Support Surface(s) Assessment (bed, cushion, seat, etc.) 0 INTERVENTIONS - Wound Cleansing / Measurement []  - Simple Wound Cleansing - one wound 0 []  - Complex Wound Cleansing - multiple wounds 0 []  - Wound Imaging (photographs - any number of wounds) 0 []  -  Wound Tracing (instead of photographs) 0 []  - Simple Wound Measurement - one wound 0 []  - Complex Wound Measurement - multiple wounds 0 INTERVENTIONS - Wound Dressings []  - Small Wound Dressing one or multiple wounds 0 []  - Medium Wound Dressing one or multiple wounds 0 []  - Large Wound Dressing one or multiple wounds 0 []  - Application of Medications - topical 0 []  - Application of Medications - injection 0 INTERVENTIONS - Miscellaneous []  - External ear exam 0 []  - Specimen Collection (cultures, biopsies, blood, body fluids, etc.) 0 []  - Specimen(s) / Culture(s) sent or taken to Lab for analysis 0 []  - Patient Transfer (multiple staff / Civil Service fast streamer / Similar devices) 0 []  - Simple Staple / Suture removal (25 or less) 0 []  - Complex Staple / Suture removal (26 or more) 0 []  - Hypo / Hyperglycemic Management (close monitor of Blood Glucose) 0 []  - Ankle / Brachial Index (ABI) - do not check if billed separately 0 X - Vital Signs 1 5 Has the patient been seen at the hospital within the last three years: Yes Total Score: 90 Level Of Care: New/Established - Level 3 Electronic Signature(s) Signed: 10/05/2019 5:49:59 PM By: Kela Millin Entered By: Kela Millin on 10/05/2019 14:48:08 -------------------------------------------------------------------------------- Multi-Disciplinary Care Plan Details Patient Name: Date of Service: [name], [name] 10/05/2019 1:15 PM Medical Record [id number] Patient Account Number: [id number] Date of Birth/Sex: Treating RN: 1946/02/05 (74 y.o. [name] Primary Care Desirie Minteer: [name] Other Clinician: Referring Misha Antonini: Treating Marlyss Cissell/Extender:[name], [name], [name] in Treatment: 0 Active Inactive HBO Nursing Diagnoses: Discomfort related to temperature and humidity changes inside hyperbaric chamber Potential for barotraumas to ears, sinuses, teeth, and lungs or cerebral gas embolism related to changes in atmospheric pressure inside hyperbaric oxygen chamber Potential for oxygen toxicity seizures related to delivery of 100% oxygen at an increased atmospheric pressure Goals: Patient will tolerate the hyperbaric oxygen therapy treatment Date Initiated: 10/05/2019 Target Resolution Date: 11/09/2019 Goal Status: Active Patient/caregiver will verbalize understanding of HBO goals, rationale, procedures and potential hazards Date Initiated: 10/05/2019 Target Resolution Date: 11/09/2019 Goal Status: Active Interventions: Assess for signs and symptoms related to adverse events, including but not limited to confinement anxiety, pneumothorax, oxygen toxicity and baurotrauma Assess patient's knowledge and expectations regarding hyperbaric medicine and provide education related to the hyperbaric environment, goals of treatment and prevention of adverse events Notes: Electronic Signature(s) Signed: 10/05/2019 5:49:59 PM By: Kela Millin Entered By: Kela Millin on 10/05/2019 13:58:53 -------------------------------------------------------------------------------- Patient/Caregiver Education Details Patient Name: Date of Service: [name] 2/5/2021andnbsp1:15 PM Medical Record [id number] Patient Account Number: [id number] Date of Birth/Gender: Treating RN: Jan 04, 1946 (74 y.o. [name] Primary Care Physician: [name] Other Clinician: Referring Physician: Treating Physician/Extender:[name], [name], [name] in Treatment: 0 Education Assessment Education Provided To: Patient Education Topics  Provided Hyperbaric Oxygenation: Methods: Demonstration, Explain/Verbal Responses: State content correctly Electronic Signature(s) Signed: 10/05/2019 5:49:59 PM By: Kela Millin Entered By: Kela Millin on 10/05/2019 14:45:09 -------------------------------------------------------------------------------- Vitals Details Patient Name: Date of Service: KESHONE, YETTER 10/05/2019 1:15 PM Medical Record [id number] Patient Account Number: [id number] Date of Birth/Sex: Treating RN: 1946/03/31 (73 y.o. [name]) [name] Primary Care Eston Heslin: [name] Other Clinician: Referring Dajah Fischman: Treating Lesia Monica/Extender:[name], [name], [name] in Treatment: 0 Vital Signs Time Taken: 13:50 Temperature (F): 98.5 Height (in): 72 Pulse (bpm): 60 Source: Stated Respiratory Rate (breaths/min): 18 Weight (lbs): 240 Blood Pressure (mmHg): 159/64 Source: Stated Reference Range: 80 - 120 mg / dl Body Mass Index (BMI): 32.5  Electronic Signature(s) Signed: 10/05/2019 5:25:54 PM By: [name] RN Entered By: [name] on 10/05/2019 13:51:07

## 2019-10-06 ENCOUNTER — Ambulatory Visit: Payer: Medicare Other | Attending: Internal Medicine

## 2019-10-06 DIAGNOSIS — Z23 Encounter for immunization: Secondary | ICD-10-CM | POA: Insufficient documentation

## 2019-10-06 NOTE — Progress Notes (Signed)
   Covid-19 Vaccination Clinic  Name:  [name]    MRN: [id number] DOB: 09/09/1945  10/06/2019  [name] was observed post Covid-19 immunization for 15 minutes without incidence. He was provided with Vaccine Information Sheet and instruction to access the V-Safe system.   [name] was instructed to call 911 with any severe reactions post vaccine: Marland Kitchen Difficulty breathing  . Swelling of your face and throat  . A fast heartbeat  . A bad rash all over your body  . Dizziness and weakness    Immunizations Administered    Name Date Dose VIS Date Route   Pfizer COVID-19 Vaccine 10/06/2019  5:11 PM 0.3 mL 08/10/2019 Intramuscular   Manufacturer: [place]   Lot: [id number]   [place]: [id number]

## 2019-10-11 ENCOUNTER — Ambulatory Visit
Admission: RE | Admit: 2019-10-11 | Discharge: 2019-10-11 | Disposition: A | Discharge status: Home / Self Care | Payer: Medicare Other | Source: Ambulatory Visit | Attending: Internal Medicine | Admitting: Internal Medicine

## 2019-10-11 ENCOUNTER — Other Ambulatory Visit: Payer: Self-pay

## 2019-10-11 DIAGNOSIS — N3041 Irradiation cystitis with hematuria: Secondary | ICD-10-CM

## 2019-10-12 ENCOUNTER — Ambulatory Visit (INDEPENDENT_AMBULATORY_CARE_PROVIDER_SITE_OTHER): Payer: Medicare Other | Admitting: Family Medicine

## 2019-10-12 ENCOUNTER — Encounter: Payer: Self-pay | Admitting: Family Medicine

## 2019-10-12 VITALS — BP 122/60 | HR 58 | Temp 96.2°F | Ht 72.0 in | Wt 248.6 lb

## 2019-10-12 DIAGNOSIS — E039 Hypothyroidism, unspecified: Secondary | ICD-10-CM | POA: Diagnosis not present

## 2019-10-12 DIAGNOSIS — B351 Tinea unguium: Secondary | ICD-10-CM | POA: Diagnosis not present

## 2019-10-12 DIAGNOSIS — D696 Thrombocytopenia, unspecified: Secondary | ICD-10-CM | POA: Diagnosis not present

## 2019-10-12 DIAGNOSIS — M109 Gout, unspecified: Secondary | ICD-10-CM | POA: Diagnosis not present

## 2019-10-12 DIAGNOSIS — E785 Hyperlipidemia, unspecified: Secondary | ICD-10-CM | POA: Insufficient documentation

## 2019-10-12 DIAGNOSIS — E7800 Pure hypercholesterolemia, unspecified: Secondary | ICD-10-CM

## 2019-10-12 LAB — CBC
HCT: 41.3 % (ref 38.5–50.0)
Hemoglobin: 14 g/dL (ref 13.2–17.1)
MCH: 30.6 pg (ref 27.0–33.0)
MCHC: 33.9 g/dL (ref 32.0–36.0)
MCV: 90.4 fL (ref 80.0–100.0)
MPV: 12.8 fL — ABNORMAL HIGH (ref 7.5–12.5)
Platelets: 140 10*3/uL (ref 140–400)
RBC: 4.57 10*6/uL (ref 4.20–5.80)
RDW: 14.1 % (ref 11.0–15.0)
WBC: 5 10*3/uL (ref 3.8–10.8)

## 2019-10-12 NOTE — Progress Notes (Signed)
Established Patient Office Visit  Subjective:  Patient ID: [name], male    DOB: 1946/03/27  Age: 74 y.o. MRN: [id number]  CC:  Chief Complaint  Patient presents with  . Follow-up    medicine refills and possible bloodwork, tdap    HPI [name] presents for follow-up of his thrombocytopenia, gout, hypothyroidism.  Gout has been well controlled with low-dose allopurinol.  He has had no other attacks.  He is taking his levothyroxine in the morning an hour before eating.  Seen urology specialist in [place] for chronic hematuria associated with radiation cystitis.  Getting ready to start 5 weeks of daily hyper Barrick therapy for his bladder.  Podiatry had diagnosed onychomycosis and let him know that there would be a treatment for it.  Status post first Covid vaccine.  Past Medical History:  Diagnosis Date  . High cholesterol   . Hypertension   . Thyroid disease     History reviewed. No pertinent surgical history.  History reviewed. No pertinent family history.  Social History   Socioeconomic History  . Marital status: Married    Spouse name: Not on file  . Number of children: Not on file  . Years of education: Not on file  . Highest education level: Not on file  Occupational History  . Not on file  Tobacco Use  . Smoking status: Never Smoker  . Smokeless tobacco: Never Used  Substance and Sexual Activity  . Alcohol use: Not Currently  . Drug use: Never  . Sexual activity: Not on file  Other Topics Concern  . Not on file  Social History Narrative  . Not on file   Social Determinants of Health   Financial Resource Strain:   . Difficulty of Paying Living Expenses: Not on file  Food Insecurity:   . Worried About Charity fundraiser in the Last Year: Not on file  . Ran Out of Food in the Last Year: Not on file  Transportation Needs:   . Lack of Transportation (Medical): Not on file  . Lack of Transportation (Non-Medical): Not on file  Physical Activity:     . Days of Exercise per Week: Not on file  . Minutes of Exercise per Session: Not on file  Stress:   . Feeling of Stress : Not on file  Social Connections:   . Frequency of Communication with Friends and Family: Not on file  . Frequency of Social Gatherings with Friends and Family: Not on file  . Attends Religious Services: Not on file  . Active Member of Clubs or Organizations: Not on file  . Attends Archivist Meetings: Not on file  . Marital Status: Not on file  Intimate Partner Violence:   . Fear of Current or Ex-Partner: Not on file  . Emotionally Abused: Not on file  . Physically Abused: Not on file  . Sexually Abused: Not on file    Outpatient Medications Prior to Visit  Medication Sig Dispense Refill  . allopurinol (ZYLOPRIM) 100 MG tablet Take 1 tablet (100 mg total) by mouth 2 (two) times daily. 180 tablet 2  . aspirin 81 MG chewable tablet Chew 1 tablet by mouth daily.    . carvedilol (COREG) 25 MG tablet Take 1 tablet (25 mg total) by mouth 2 (two) times daily. 180 tablet 2  . Coenzyme Q10 (COQ10 PO) Take by mouth.    . folic acid (FOLVITE) 1 MG tablet Take 1 tablet (1 mg total) by mouth daily. [place]  tablet 2  . indomethacin (INDOCIN) 25 MG capsule Take by mouth as needed.    Marland Kitchen levothyroxine (SYNTHROID) 75 MCG tablet Take 1 tablet (75 mcg total) by mouth daily. 90 tablet 2  . rosuvastatin (CRESTOR) 10 MG tablet Take 1 tablet (10 mg total) by mouth daily. 90 tablet 1   No facility-administered medications prior to visit.    No Known Allergies  ROS Review of Systems  Constitutional: Negative.   Respiratory: Negative.   Cardiovascular: Negative.   Gastrointestinal: Negative.   Genitourinary: Negative for hematuria.  Neurological: Negative for light-headedness.  Psychiatric/Behavioral: Negative.       Objective:    Physical Exam  Constitutional: He is oriented to person, place, and time. He appears well-developed and well-nourished. No distress.   HENT:  Head: Normocephalic and atraumatic.  Right Ear: External ear normal.  Left Ear: External ear normal.  Eyes: Conjunctivae are normal. Right eye exhibits no discharge. Left eye exhibits no discharge. No scleral icterus.  Pulmonary/Chest: Effort normal.  Neurological: He is alert and oriented to person, place, and time.  Skin: Skin is warm and dry. He is not diaphoretic.  Psychiatric: He has a normal mood and affect. His behavior is normal.    BP 122/60   Pulse (!) 58   Temp (!) 96.2 F (35.7 C) (Tympanic)   Ht 6' (1.829 m)   Wt 248 lb 9.6 oz (112.8 kg)   SpO2 96%   BMI 33.72 kg/m  Wt Readings from Last 3 Encounters:  10/12/19 248 lb 9.6 oz (112.8 kg)  05/03/19 244 lb (110.7 kg)     Health Maintenance Due  Topic Date Due  . Hepatitis C Screening  09-04-1945  . TETANUS/TDAP  02/02/1965  . PNA vac Low Risk Adult (1 of 2 - PCV13) 02/03/2011    There are no preventive care reminders to display for this patient.  Lab Results  Component Value Date   TSH 3.23 04/20/2019   Lab Results  Component Value Date   WBC 5.3 05/03/2019   HGB 14.5 05/03/2019   HCT 43.1 05/03/2019   MCV 90.5 05/03/2019   PLT 148.0 (L) 05/03/2019   Lab Results  Component Value Date   NA 142 04/20/2019   K 5.3 04/20/2019   BUN 17 04/20/2019   CREATININE 1.0 04/20/2019   ALKPHOS 71 04/20/2019   AST 15 04/20/2019   ALT 13 04/20/2019   Lab Results  Component Value Date   CHOL 152 04/20/2019   Lab Results  Component Value Date   HDL 42 04/20/2019   Lab Results  Component Value Date   LDLCALC 86 04/20/2019   Lab Results  Component Value Date   TRIG 138 04/20/2019   No results found for: CHOLHDL Lab Results  Component Value Date   HGBA1C 5.8 04/20/2019      Assessment & Plan:   Problem List Items Addressed This Visit      Endocrine   Hypothyroidism     Musculoskeletal and Integument   Onychomycosis     Other   Thrombocytopenia (HCC)   Relevant Orders   CBC    Gout - Primary   Elevated cholesterol      No orders of the defined types were placed in this encounter.   Follow-up: Return in about 3 months (around 01/09/2020).  Return in 3 months we will check his cholesterol, allopurinol, TSH and discuss possible treatment of onychomycosis.  Libby Maw, MD

## 2019-10-15 ENCOUNTER — Ambulatory Visit: Payer: Medicare Other

## 2019-10-15 ENCOUNTER — Encounter (HOSPITAL_BASED_OUTPATIENT_CLINIC_OR_DEPARTMENT_OTHER): Payer: Medicare Other | Attending: Internal Medicine | Admitting: Internal Medicine

## 2019-10-15 ENCOUNTER — Other Ambulatory Visit: Payer: Self-pay

## 2019-10-15 DIAGNOSIS — N3041 Irradiation cystitis with hematuria: Secondary | ICD-10-CM | POA: Diagnosis not present

## 2019-10-15 NOTE — Progress Notes (Signed)
[name], [name] ([id number]) Visit Report for 10/15/2019 Arrival Information Details Patient Name: Date of Service: [name], [name] 10/15/2019 8:00 AM Medical Record [id number] Patient Account Number: [id number] Date of Birth/Sex: Treating RN: 01-Nov-1945 (74 y.o. [name], [name] Primary Care [name]: [name] Other Clinician: Mikeal [name] Referring [name]: Treating [name] in Treatment: 1 Visit Information History Since Last Visit Added or deleted any medications: No Patient Arrived: Ambulatory Any new allergies or adverse reactions: No Arrival Time: 07:45 Had a fall or experienced change in No Accompanied By: self activities of daily living that may affect Transfer Assistance: None risk of falls: Patient Identification Verified: Yes Signs or symptoms of abuse/neglect since last No Secondary Verification Process Yes visito Completed: Hospitalized since last visit: No Patient Requires Transmission-Based No Implantable device outside of the clinic excluding No Precautions: cellular tissue based products placed in the center Patient Has Alerts: No since last visit: Pain Present Now: No Electronic Signature(s) Signed: 10/15/2019 4:24:03 PM By: [name] EMT/HBOT Entered By: [name] on 10/15/2019 09:12:55 -------------------------------------------------------------------------------- Encounter Discharge Information Details Patient Name: Date of Service: [name], [name] 10/15/2019 8:00 AM Medical Record [id number] Patient Account Number: [id number] Date of Birth/Sex: Treating RN: 11-Mar-1946 (74 y.o. [name] Primary Care [name]: [name] Other Clinician: Mikeal [name] Referring [name]: Treating [name]/Extender:[name], [place] [name] in Treatment: 1 Encounter Discharge Information Items Discharge Condition: Stable Ambulatory Status: Ambulatory Discharge  Destination: Home Transportation: Private Auto Accompanied By: self Schedule Follow-up Appointment: Yes Clinical Summary of Care: Patient Declined Electronic Signature(s) Signed: 10/15/2019 4:24:03 PM By: [name] EMT/HBOT Entered By: [name] on 10/15/2019 11:33:31 -------------------------------------------------------------------------------- Patient/Caregiver Education Details Patient Name: Date of Service: [name], [name] 2/15/2021andnbsp8:00 AM Medical Record [id number] Patient Account Number: [id number] Date of Birth/Gender: Treating RN: Oct 13, 1945 (73 y.o. [name] Primary Care Physician: [name] Other Clinician: Mikeal [name] Referring Physician: Treating Physician/Extender:[name], [place] [name] in Treatment: 1 Education Assessment Education Provided To: Patient Education Topics Provided Hyperbaric Oxygenation: Methods: Explain/Verbal Responses: State content correctly Electronic Signature(s) Signed: 10/15/2019 4:24:03 PM By: [name] EMT/HBOT Entered By: [name] on 10/15/2019 11:33:07 -------------------------------------------------------------------------------- Vitals Details Patient Name: Date of Service: [name] 10/15/2019 8:00 AM Medical Record [id number] Patient Account Number: [id number] Date of Birth/Sex: Treating RN: 11-20-1945 (74 y.o. [name] Primary Care Dylin Ihnen: [name] Other Clinician: Mikeal [name] Referring Tanayah Squitieri: Treating Reef Achterberg/Extender:[name], [place] [name] in Treatment: 1 Vital Signs Time Taken: 07:50 Temperature (F): 98.2 Height (in): 72 Pulse (bpm): 53 Weight (lbs): 240 Respiratory Rate (breaths/min): 16 Body Mass Index (BMI): 32.5 Blood Pressure (mmHg): 114/91 Reference Range: 80 - 120 mg / dl Electronic Signature(s) Signed: 10/15/2019 4:24:03 PM By: [name] EMT/HBOT Entered By: [name] on 10/15/2019  09:13:16

## 2019-10-15 NOTE — Progress Notes (Signed)
DONYA, SHAHAN ([id number]) Visit Report for 10/15/2019 SuperBill Details Patient Name: Date of Service: [name], [name] 10/15/2019 Medical Record [id number] Patient Account Number: [id number] Date of Birth/Sex: Treating RN: 03-16-1946 (74 y.o. 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

## 2019-10-15 NOTE — Progress Notes (Addendum)
[name], [name] ([id number]) Visit Report for 10/15/2019 HBO Details Patient Name: Date of Service: [name], [name] 10/15/2019 8:00 AM Medical Record [id number] Patient Account Number: [id number] Date of Birth/Sex: Treating RN: [birth year] (74 y.o. [name] Primary Care Stephenson Cichy: [name] Other Clinician: [name] Referring Synethia Endicott: Treating Haylei Cobin/Extender:[name], [name], [name] HBO Treatment Course Details Treatment Course Number: 1 Ordering Vihan Santagata: [name] Total Treatments Ordered: 40 HBO Treatment Start Date: 10/15/2019 HBO Indication: Late Effect of Radiation HBO Treatment Details Treatment Number: 1 Patient Type: Outpatient Chamber Type: Monoplace Chamber Serial #: [id number] Treatment Protocol: 2.5 ATA with 90 minutes oxygen, with two 5 minute air breaks Treatment Details Compression Rate Down: 1.0 psi / minute De-Compression Rate Up: 2.0 psi / minute Air breaks and CompressTx Pressure breathing periods DecompressDecompress Begins Reached (leave unused spaces Begins Ends blank) Chamber Pressure (ATA)1 2.5 2.5 2.5 2.5 2.5 --2.5 1 Clock Time (24 hr) 08:12 08:27 [id number] 10:19 Treatment Length: 127 (minutes) Treatment Segments: 4 Vital Signs Capillary Blood Glucose Reference Range: 80 - 120 mg / dl HBO Diabetic Blood Glucose Intervention Range: <131 mg/dl or >249 mg/dl Time Vitals Blood Respiratory Capillary Blood Glucose Pulse Action Type: Pulse: Temperature: Taken: Pressure: Rate: Glucose (mg/dl): Meter #: Oximetry (%) Taken: Pre 07:50 114/91 53 16 98.2 Post 10:21 158/79 57 15 98.5 Treatment Response Treatment Toleration: Well Treatment Completion Treatment Completed without Adverse Event Status: Keeven Matty Notes The patient tolerates hyperbarics well. His first dive today. Physical exam clear air entry bilaterally. Cardiac heart sounds were normal. He had some cerumen in the right  ear that precluded visualization of the tympanic membrane but the left ear was clear. Again he had no issues Physician HBO Attestation: I certify that I supervised this HBO treatment in accordance with Medicare guidelines. A trained Yes emergency response team is readily available per hospital policies and procedures. Continue HBOT as ordered. Yes Electronic Signature(s) Signed: 10/15/2019 6:16:05 PM By: [name] MD Previous Signature: 10/15/2019 4:24:03 PM Version By: [name] EMT/HBOT Entered By: [name] on 10/15/2019 18:12:32 -------------------------------------------------------------------------------- HBO Safety Checklist Details Patient Name: Date of Service: [name], [name] 10/15/2019 8:00 AM Medical Record [id number] Patient Account Number: [id number] Date of Birth/Sex: Treating RN: Mar 04, 1946 (74 y.o. [name], [name] Primary Care Jarvis Knodel: [name] Other Clinician: [name] Referring Neamiah Sciarra: Treating Martika Egler/Extender:[name], [name], [name] HBO Safety Checklist Items Safety Checklist Consent Form Signed Patient voided / foley secured and emptied When did you last eato n/a Last dose of injectable or oral agent n/a NA Ostomy pouch emptied and vented if applicable NA All implantable devices assessed, documented and approved NA Intravenous access site secured and place Valuables secured Linens and cotton and cotton/polyester blend (less than 51% polyester) Personal oil-based products / skin lotions / body lotions removed NA Wigs or hairpieces removed NA Smoking or tobacco materials removed Books / newspapers / magazines / loose paper removed Cologne, aftershave, perfume and deodorant removed Jewelry removed (may wrap wedding band) NA Make-up removed Hair care products removed NA Battery operated devices (external) removed NA Heating patches and chemical warmers removed NA Titanium eyewear  removed NA Nail polish cured greater than 10 hours NA Casting material cured greater than 10 hours NA Hearing aids removed NA Loose dentures or partials removed NA Prosthetics have been removed Patient demonstrates correct use of air break device (if applicable) Patient concerns have been addressed Patient grounding bracelet on and cord attached to chamber Specifics for Inpatients (complete in addition to  above) Medication sheet sent with patient Intravenous medications needed or due during therapy sent with patient Drainage tubes (e.g. nasogastric tube or chest tube secured and vented) Endotracheal or Tracheotomy tube secured Cuff deflated of air and inflated with saline Airway suctioned Electronic Signature(s) Signed: 10/15/2019 9:14:08 AM By: [name] EMT/HBOT Entered By: [name] on 10/15/2019 09:14:08

## 2019-10-16 ENCOUNTER — Encounter (HOSPITAL_BASED_OUTPATIENT_CLINIC_OR_DEPARTMENT_OTHER): Payer: Medicare Other | Admitting: Internal Medicine

## 2019-10-16 DIAGNOSIS — N3041 Irradiation cystitis with hematuria: Secondary | ICD-10-CM | POA: Diagnosis not present

## 2019-10-16 NOTE — Progress Notes (Signed)
[name], [name] ([id number]) Visit Report for 10/16/2019 Arrival Information Details Patient Name: Date of Service: [name], [name] 10/16/2019 8:00 AM Medical Record [id number] Patient Account Number: [id number] Date of Birth/Sex: Treating RN: 1946-03-10 (73 y.o. [name]) [name] Primary Care [name]: [name] Other Clinician: Mikeal [name] Referring [name]: Treating [name]/Extender:[name], [name] Visit Information History Since Last Visit Added or deleted any medications: No Patient Arrived: Ambulatory Any new allergies or adverse reactions: No Arrival Time: 07:45 Had a fall or experienced change in No Accompanied By: self activities of daily living that may affect Transfer Assistance: None risk of falls: Patient Identification Verified: Yes Signs or symptoms of abuse/neglect since last No Secondary Verification Process Yes visito Completed: Hospitalized since last visit: No Patient Requires Transmission-Based No Implantable device outside of the clinic excluding No Precautions: cellular tissue based products placed in the center Patient Has Alerts: No since last visit: Pain Present Now: No Electronic Signature(s) Signed: 10/16/2019 4:25:10 PM By: [name] EMT/HBOT Entered By: [name] on 10/16/2019 08:34:00 -------------------------------------------------------------------------------- Encounter Discharge Information Details Patient Name: Date of Service: [name], [name] 10/16/2019 8:00 AM Medical Record [id number] Patient Account Number: [id number] Date of Birth/Sex: Treating RN: Jan 30, 1946 (73 y.o. [name]) [name] Primary Care [name]: [name] Other Clinician: Mikeal [name] Referring Genola Yuille: Treating [name]/Extender:[name], [name] Encounter Discharge Information Items Discharge Condition: Stable Ambulatory Status: Ambulatory Discharge  Destination: Home Transportation: Private Auto Accompanied By: self Schedule Follow-up Appointment: Yes Clinical Summary of Care: Patient Declined Electronic Signature(s) Signed: 10/16/2019 4:25:10 PM By: [name] EMT/HBOT Entered By: [name] on 10/16/2019 11:38:48 -------------------------------------------------------------------------------- Patient/Caregiver Education Details Patient Name: Date of Service: [name], [name] 2/16/2021andnbsp8:00 AM Medical Record [id number] Patient Account Number: [id number] Date of Birth/Gender: Treating RN: 1946-05-26 (73 y.o. [name] Primary Care Physician: [name] Other Clinician: Mikeal [name] Referring Physician: Treating Physician/Extender:[name], [name] Education Assessment Education Provided To: Patient Education Topics Provided Hyperbaric Oxygenation: Methods: Explain/Verbal Responses: State content correctly Electronic Signature(s) Signed: 10/16/2019 4:25:10 PM By: [name] EMT/HBOT Entered By: [name] on 10/16/2019 11:38:25 -------------------------------------------------------------------------------- Vitals Details Patient Name: Date of Service: [name] 10/16/2019 8:00 AM Medical Record [id number] Patient Account Number: [id number] Date of Birth/Sex: Treating RN: Apr 26, 1946 (73 y.o. [name]) [name] Primary Care Anderson Middlebrooks: [name] Other Clinician: Mikeal [name] Referring Tanysha Quant: Treating Equan Cogbill/Extender:[name], [name] Vital Signs Time Taken: 07:50 Temperature (F): 98.1 Height (in): 72 Pulse (bpm): 55 Weight (lbs): 240 Respiratory Rate (breaths/min): 16 Body Mass Index (BMI): 32.5 Blood Pressure (mmHg): 138/75 Reference Range: 80 - 120 mg / dl Electronic Signature(s) Signed: 10/16/2019 4:25:10 PM By: [name] EMT/HBOT Entered By: [name] on 10/16/2019  08:34:52

## 2019-10-16 NOTE — Progress Notes (Signed)
CHANSON, ASCHOFF ([id number]) Visit Report for 10/16/2019 SuperBill Details Patient Name: Date of Service: [name], [name] 10/16/2019 Medical Record [id number] Patient Account Number: [id number] Date of Birth/Sex: Treating RN: 08/01/1946 (73 y.o. 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

## 2019-10-16 NOTE — Progress Notes (Addendum)
MITCHEAL, RABBITT ([id number]) Visit Report for 10/16/2019 HBO Details Patient Name: Date of Service: OTHIE, [name] 10/16/2019 8:00 AM Medical Record [id number] Patient Account Number: [id number] Date of Birth/Sex: Treating RN: March 25, 1946 (74 y.o. [name]) [name] Primary Care Tyshay Adee: [name] Other Clinician: [name] Referring Hasna Stefanik: Treating Dorreen Valiente/Extender:[name], [name], [name] HBO Treatment Course Details Treatment Course Number: 1 Ordering Alice Burnside: [name] Total Treatments Ordered: 40 HBO Treatment Start Date: 10/15/2019 HBO Indication: Late Effect of Radiation HBO Treatment Details Treatment Number: 2 Patient Type: Outpatient Chamber Type: Monoplace Chamber Serial #: [id number] Treatment Protocol: 2.5 ATA with 90 minutes oxygen, with two 5 minute air breaks Treatment Details Compression Rate Down: 2.0 psi / minute De-Compression Rate Up: 2.0 psi / minute Air breaks and CompressTx Pressure breathing periods DecompressDecompress Begins Reached (leave unused spaces Begins Ends blank) Chamber Pressure (ATA)1 2.5 2.5 2.5 2.5 2.5 --2.5 1 Clock Time (24 hr) 08:05 08:17 [id number] 10:09 Treatment Length: 124 (minutes) Treatment Segments: 4 Vital Signs Capillary Blood Glucose Reference Range: 80 - 120 mg / dl HBO Diabetic Blood Glucose Intervention Range: <131 mg/dl or >249 mg/dl Time Vitals Blood Respiratory Capillary Blood Glucose Pulse Action Type: Pulse: Temperature: Taken: Pressure: Rate: Glucose (mg/dl): Meter #: Oximetry (%) Taken: Pre 07:50 138/75 55 16 98.1 Post 10:11 141/77 54 14 98.3 Treatment Response Treatment Toleration: Well Treatment Completion Treatment Completed without Adverse Event Status: Jourdin Gens Notes No concerns with treatment given Physician HBO Attestation: I certify that I supervised this HBO treatment in accordance with Medicare guidelines. A  trained Yes Yes emergency response team is readily available per hospital policies and procedures. Continue HBOT as ordered. Yes Electronic Signature(s) Signed: 10/16/2019 5:18:57 PM By: [name] MD Previous Signature: 10/16/2019 4:25:10 PM Version By: [name] EMT/HBOT Entered By: [name] on 10/16/2019 17:03:52 -------------------------------------------------------------------------------- HBO Safety Checklist Details Patient Name: Date of Service: [name], [name] 10/16/2019 8:00 AM Medical Record [id number] Patient Account Number: [id number] Date of Birth/Sex: Treating RN: Oct 11, 1945 (74 y.o. [name]) [name] Primary Care Jovaughn Wojtaszek: [name] Other Clinician: [name] Referring Yvonna Brun: Treating Laressa Bolinger/Extender:[name], [name], [name] HBO Safety Checklist Items Safety Checklist Consent Form Signed Patient voided / foley secured and emptied When did you last eato n/a Last dose of injectable or oral agent n/a NA Ostomy pouch emptied and vented if applicable NA All implantable devices assessed, documented and approved NA Intravenous access site secured and place Valuables secured Linens and cotton and cotton/polyester blend (less than 51% polyester) Personal oil-based products / skin lotions / body lotions removed NA Wigs or hairpieces removed NA Smoking or tobacco materials removed Books / newspapers / magazines / loose paper removed Cologne, aftershave, perfume and deodorant removed Jewelry removed (may wrap wedding band) NA Make-up removed Hair care products removed NA Battery operated devices (external) removed NA Heating patches and chemical warmers removed NA Titanium eyewear removed NA Nail polish cured greater than 10 hours NA Casting material cured greater than 10 hours NA Hearing aids removed NA Loose dentures or partials removed NA Prosthetics have been removed Patient demonstrates correct use  of air break device (if applicable) Patient concerns have been addressed Patient grounding bracelet on and cord attached to chamber Specifics for Inpatients (complete in addition to above) Medication sheet sent with patient Intravenous medications needed or due during therapy sent with patient Drainage tubes (e.g. nasogastric tube or chest tube secured and vented) Endotracheal or Tracheotomy tube secured Cuff deflated of air and inflated with saline  Airway suctioned Electronic Signature(s) Signed: 10/16/2019 8:35:53 AM By: [name] EMT/HBOT Entered By: [name] on 10/16/2019 08:35:53

## 2019-10-17 ENCOUNTER — Other Ambulatory Visit: Payer: Self-pay

## 2019-10-17 ENCOUNTER — Encounter (HOSPITAL_BASED_OUTPATIENT_CLINIC_OR_DEPARTMENT_OTHER): Payer: Medicare Other | Admitting: Physician Assistant

## 2019-10-17 DIAGNOSIS — N3041 Irradiation cystitis with hematuria: Secondary | ICD-10-CM | POA: Diagnosis not present

## 2019-10-17 NOTE — Progress Notes (Signed)
[name], [name] ([id number]) Visit Report for 10/17/2019 Arrival Information Details Patient Name: Date of Service: [name], [name] 10/17/2019 8:00 AM Medical Record [id number] Patient Account Number: [id number] Date of Birth/Sex: Treating RN: 14-Jan-1946 (74 y.o. [name] Primary Care Beldon Nowling: [name] Other Clinician: [name] Referring Damichael Hofman: Treating Lerlene Treadwell/Extender:Stone III, [name], [name] Visit Information History Since Last Visit Added or deleted any medications: No Patient Arrived: Ambulatory Any new allergies or adverse reactions: No Arrival Time: 07:50 Had a fall or experienced change in No Accompanied By: self activities of daily living that may affect Transfer Assistance: None risk of falls: Patient Identification Verified: Yes Signs or symptoms of abuse/neglect since last No Secondary Verification Process Yes visito Completed: Hospitalized since last visit: No Patient Requires Transmission-Based No Implantable device outside of the clinic excluding No Precautions: cellular tissue based products placed in the center Patient Has Alerts: No since last visit: Pain Present Now: No Electronic Signature(s) Signed: 10/17/2019 4:27:12 PM By: [name] EMT/HBOT Entered By: [name] on 10/17/2019 08:19:52 -------------------------------------------------------------------------------- Encounter Discharge Information Details Patient Name: Date of Service: [name], [name] 10/17/2019 8:00 AM Medical Record [id number] Patient Account Number: [id number] Date of Birth/Sex: Treating RN: Dec 07, 1945 (74 y.o. [name] Primary Care Jailyn Leeson: [name] Other Clinician: [name] Referring Imara Standiford: Treating Breana Litts/Extender:Stone III, [name], [name] Encounter Discharge Information Items Discharge Condition: Stable Ambulatory Status: Ambulatory Discharge  Destination: Home Transportation: Private Auto Accompanied By: self Schedule Follow-up Appointment: Yes Clinical Summary of Care: Patient Declined Electronic Signature(s) Signed: 10/17/2019 4:27:12 PM By: [name] EMT/HBOT Entered By: [name] on 10/17/2019 11:11:23 -------------------------------------------------------------------------------- Patient/Caregiver Education Details Patient Name: Date of Service: [name], [name] 2/17/2021andnbsp8:00 AM Medical Record [id number] Patient Account Number: [id number] Date of Birth/Gender: Treating RN: 11-03-1945 (73 y.o. [name] Primary Care Physician: [name] Other Clinician: [name] Referring Physician: Treating Physician/Extender:Stone III, [name], [name] Education Assessment Education Provided To: Patient Education Topics Provided Hyperbaric Oxygenation: Methods: Explain/Verbal Responses: State content correctly Electronic Signature(s) Signed: 10/17/2019 4:27:12 PM By: [name] EMT/HBOT Entered By: [name] on 10/17/2019 11:11:10 -------------------------------------------------------------------------------- Vitals Details Patient Name: Date of Service: [name] 10/17/2019 8:00 AM Medical Record [id number] Patient Account Number: [id number] Date of Birth/Sex: Treating RN: [birth year] (74 y.o. [name] Primary Care Durwood Dittus: [name] Other Clinician: [name] Referring Nataliyah Packham: Treating Devony Mcgrady/Extender:Stone III, [name], [name] Vital Signs Time Taken: 07:55 Temperature (F): 98 Height (in): 72 Pulse (bpm): 50 Weight (lbs): 240 Respiratory Rate (breaths/min): 17 Body Mass Index (BMI): 32.5 Blood Pressure (mmHg): 143/75 Reference Range: 80 - 120 mg / dl Electronic Signature(s) Signed: 10/17/2019 4:27:12 PM By: [name] EMT/HBOT Entered By: [name] on 10/17/2019  08:20:06

## 2019-10-17 NOTE — Progress Notes (Addendum)
DARLE, NEWBERG ([id number]) Visit Report for 10/17/2019 HBO Details Patient Name: Date of Service: [name], [name] 10/17/2019 8:00 AM Medical Record [id number] Patient Account Number: [id number] Date of Birth/Sex: Treating RN: Aug 05, 1946 (74 y.o. [name] Primary Care Avid Guillette: [name] Other Clinician: [name] Referring Clemencia Helzer: Treating Dajon Rowe/Extender:Stone III, [name], [name] HBO Treatment Course Details Treatment Course Number: 1 Ordering Dalisa Forrer: [name] Total Treatments Ordered: 40 HBO Treatment Start Date: 10/15/2019 HBO Indication: Late Effect of Radiation HBO Treatment Details Treatment Number: 3 Patient Type: Outpatient Chamber Type: Monoplace Chamber Serial #: [id number] Treatment Protocol: 2.5 ATA with 90 minutes oxygen, with two 5 minute air breaks Treatment Details Compression Rate Down: 2.0 psi / minute De-Compression Rate Up: 2.0 psi / minute Air breaks and CompressTx Pressure breathing periods DecompressDecompress Begins Reached (leave unused spaces Begins Ends blank) Chamber Pressure (ATA)1 2.5 2.5 2.5 2.5 2.5 --2.5 1 Clock Time (24 hr) 08:05 08:17 [id number] 10:09 Treatment Length: 124 (minutes) Treatment Segments: 4 Vital Signs Capillary Blood Glucose Reference Range: 80 - 120 mg / dl HBO Diabetic Blood Glucose Intervention Range: <131 mg/dl or >249 mg/dl Time Vitals Blood Respiratory Capillary Blood Glucose Pulse Action Type: Pulse: Temperature: Taken: Pressure: Rate: Glucose (mg/dl): Meter #: Oximetry (%) Taken: Pre 07:55 143/75 50 17 98 Post 10:11 153/74 55 15 98.4 Treatment Response Treatment Toleration: Well Treatment Completion Treatment Completed without Adverse Event Status: Physician HBO Attestation: I certify that I supervised this HBO treatment in accordance with Medicare guidelines. A trained Yes emergency response team is readily available  per hospital policies and procedures. Continue HBOT as ordered. Yes Electronic Signature(s) Signed: 10/18/2019 12:37:21 PM By: Worthy Keeler PA-C Previous Signature: 10/17/2019 4:27:12 PM Version By: [name] EMT/HBOT Entered By: Worthy Keeler on 10/18/2019 11:45:14 -------------------------------------------------------------------------------- HBO Safety Checklist Details Patient Name: Date of Service: [name], [name] 10/17/2019 8:00 AM Medical Record [id number] Patient Account Number: [id number] Date of Birth/Sex: Treating RN: 17-Jan-1946 (74 y.o. [name] Primary Care Lovett Coffin: [name] Other Clinician: [name] Referring Malania Gawthrop: Treating Otis Burress/Extender:Stone III, [name], [name] HBO Safety Checklist Items Safety Checklist Consent Form Signed Patient voided / foley secured and emptied When did you last eato n/a Last dose of injectable or oral agent n/a NA Ostomy pouch emptied and vented if applicable NA All implantable devices assessed, documented and approved NA Intravenous access site secured and place Valuables secured Linens and cotton and cotton/polyester blend (less than 51% polyester) Personal oil-based products / skin lotions / body lotions removed NA Wigs or hairpieces removed NA Smoking or tobacco materials removed Books / newspapers / magazines / loose paper removed Cologne, aftershave, perfume and deodorant removed Jewelry removed (may wrap wedding band) NA Make-up removed Hair care products removed NA Battery operated devices (external) removed NA Heating patches and chemical warmers removed NA Titanium eyewear removed NA Nail polish cured greater than 10 hours NA Casting material cured greater than 10 hours NA Hearing aids removed NA Loose dentures or partials removed NA Prosthetics have been removed Patient demonstrates correct use of air break device (if applicable) Patient concerns have  been addressed Patient grounding bracelet on and cord attached to chamber Specifics for Inpatients (complete in addition to above) Medication sheet sent with patient Intravenous medications needed or due during therapy sent with patient Drainage tubes (e.g. nasogastric tube or chest tube secured and vented) Endotracheal or Tracheotomy tube secured Cuff deflated of air and inflated with saline Airway suctioned Electronic Signature(s)  Signed: 10/17/2019 8:20:54 AM By: [name] EMT/HBOT Entered By: [name] on 10/17/2019 08:20:54

## 2019-10-18 ENCOUNTER — Encounter (HOSPITAL_BASED_OUTPATIENT_CLINIC_OR_DEPARTMENT_OTHER): Payer: Medicare Other | Admitting: Internal Medicine

## 2019-10-18 NOTE — Progress Notes (Signed)
[name], [name] ([id number]) Visit Report for 10/17/2019 Problem List Details Patient Name: Date of Service: [name], [name] 10/17/2019 8:00 AM Medical Record [id number] Patient Account Number: [id number] Date of Birth/Sex: Treating RN: [birth year] (74 y.o. 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

## 2019-10-19 ENCOUNTER — Encounter (HOSPITAL_BASED_OUTPATIENT_CLINIC_OR_DEPARTMENT_OTHER): Payer: Medicare Other | Admitting: Internal Medicine

## 2019-10-19 ENCOUNTER — Other Ambulatory Visit: Payer: Self-pay

## 2019-10-19 DIAGNOSIS — N3041 Irradiation cystitis with hematuria: Secondary | ICD-10-CM | POA: Diagnosis not present

## 2019-10-19 NOTE — Progress Notes (Signed)
[name], [name] ([id number]) Visit Report for 10/19/2019 Arrival Information Details Patient Name: Date of Service: [name], [name] 10/19/2019 8:00 AM Medical Record [id number] Patient Account Number: [id number] Date of Birth/Sex: Treating RN: 10-05-1945 (74 y.o. [name] Primary Care Destin Kittler: [name] Other Clinician: [name] Referring Keishana Klinger: Treating Romano Stigger/Extender:[name], [name], [name] in Treatment: 2 Visit Information History Since Last Visit Added or deleted any medications: No Patient Arrived: Ambulatory Any new allergies or adverse reactions: No Arrival Time: 07:55 Had a fall or experienced change in No Accompanied By: self activities of daily living that may affect Transfer Assistance: None risk of falls: Patient Identification Verified: Yes Signs or symptoms of abuse/neglect since last No Secondary Verification Process Yes visito Completed: Hospitalized since last visit: No Patient Requires Transmission-Based No Implantable device outside of the clinic excluding No Precautions: cellular tissue based products placed in the center Patient Has Alerts: No since last visit: Pain Present Now: No Electronic Signature(s) Signed: 10/19/2019 5:06:31 PM By: [name] EMT/HBOT Entered By: [name] on 10/19/2019 08:26:56 -------------------------------------------------------------------------------- Encounter Discharge Information Details Patient Name: Date of Service: [name], [name] 10/19/2019 8:00 AM Medical Record [id number] Patient Account Number: [id number] Date of Birth/Sex: Treating RN: 1945/12/07 (74 y.o. [name] Primary Care Bernadetta Roell: [name] Other Clinician: [name] Referring Mardella Nuckles: Treating Netasha Wehrli/Extender:[name], [name], [name] in Treatment: 2 Encounter Discharge Information Items Discharge Condition: Stable Ambulatory Status: Ambulatory Discharge  Destination: Home Transportation: Private Auto Accompanied By: self Schedule Follow-up Appointment: Yes Clinical Summary of Care: Patient Declined Electronic Signature(s) Signed: 10/19/2019 5:06:31 PM By: [name] EMT/HBOT Entered By: [name] on 10/19/2019 10:32:11 -------------------------------------------------------------------------------- Patient/Caregiver Education Details Patient Name: Date of Service: [name], [name] 2/19/2021andnbsp8:00 AM Medical Record [id number] Patient Account Number: [id number] Date of Birth/Gender: Treating RN: 02/27/1946 (73 y.o. [name] Primary Care Physician: [name] Other Clinician: [name] Referring Physician: Treating Physician/Extender:[name], [name], [name] in Treatment: 2 Education Assessment Education Provided To: Patient Education Topics Provided Hyperbaric Oxygenation: Methods: Explain/Verbal Responses: State content correctly Electronic Signature(s) Signed: 10/19/2019 5:06:31 PM By: [name] EMT/HBOT Entered By: [name] on 10/19/2019 10:31:58 -------------------------------------------------------------------------------- Vitals Details Patient Name: Date of Service: [name], [name] 10/19/2019 8:00 AM Medical Record [id number] Patient Account Number: [id number] Date of Birth/Sex: Treating RN: Nov 16, 1945 (74 y.o. [name] Primary Care Silviano Neuser: [name] Other Clinician: [name] Referring Lamesha Tibbits: Treating Mariangela Heldt/Extender:[name], [name], [name] in Treatment: 2 Vital Signs Time Taken: 08:00 Temperature (F): 98.4 Height (in): 72 Pulse (bpm): 52 Weight (lbs): 240 Respiratory Rate (breaths/min): 16 Body Mass Index (BMI): 32.5 Blood Pressure (mmHg): 143/71 Reference Range: 80 - 120 mg / dl Electronic Signature(s) Signed: 10/19/2019 5:06:31 PM By: [name] EMT/HBOT Entered By: [name] on 10/19/2019  08:27:12

## 2019-10-19 NOTE — Progress Notes (Addendum)
ANVIT, PASQUAL ([id number]) Visit Report for 10/19/2019 HBO Details Patient Name: Date of Service: KACI, LASSLEY 10/19/2019 8:00 AM Medical Record [id number] Patient Account Number: [id number] Date of Birth/Sex: Treating RN: 1945/10/09 (74 y.o. [name] Primary Care Valta Dillon: [name] Other Clinician: [name] Referring Marelin Tat: Treating Alexa Golebiewski/Extender:[name], [name], [name] in Treatment: 2 HBO Treatment Course Details Treatment Course Number: 1 Ordering Reshaun Briseno: [name] Total Treatments Ordered: 40 HBO Treatment Start Date: 10/15/2019 HBO Indication: Late Effect of Radiation HBO Treatment Details Treatment Number: 4 Patient Type: Outpatient Chamber Type: Monoplace Chamber Serial #: [id number] Treatment Protocol: 2.5 ATA with 90 minutes oxygen, with two 5 minute air breaks Treatment Details Compression Rate Down: 2.0 psi / minute De-Compression Rate Up: 2.0 psi / minute Air breaks and CompressTx Pressure breathing periods DecompressDecompress Begins Reached (leave unused spaces Begins Ends blank) Chamber Pressure (ATA)1 2.5 2.5 2.5 2.5 2.5 --2.5 1 Clock Time (24 hr) 08:04 08:16 [id number] 10:08 Treatment Length: 124 (minutes) Treatment Segments: 4 Vital Signs Capillary Blood Glucose Reference Range: 80 - 120 mg / dl HBO Diabetic Blood Glucose Intervention Range: <131 mg/dl or >249 mg/dl Time Vitals Blood Respiratory Capillary Blood Glucose Pulse Action Type: Pulse: Temperature: Taken: Pressure: Rate: Glucose (mg/dl): Meter #: Oximetry (%) Taken: Pre 08:00 143/71 52 16 98.4 Post 10:10 161/70 55 14 98 Treatment Response Treatment Toleration: Well Treatment Completion Treatment Completed without Adverse Event Status: Chonda Baney Notes No concerns with treatment given Physician HBO Attestation: I certify that I supervised this HBO treatment in accordance with Medicare guidelines. A  trained Yes Yes emergency response team is readily available per hospital policies and procedures. Continue HBOT as ordered. Yes Electronic Signature(s) Signed: 10/19/2019 6:29:59 PM By: [name] MD Previous Signature: 10/19/2019 5:06:31 PM Version By: [name] EMT/HBOT Entered By: [name] on 10/19/2019 18:22:56 -------------------------------------------------------------------------------- HBO Safety Checklist Details Patient Name: Date of Service: LEODIS, DILEONARDO 10/19/2019 8:00 AM Medical Record [id number] Patient Account Number: [id number] Date of Birth/Sex: Treating RN: 11-06-1945 (74 y.o. [name] Primary Care Graycen Degan: [name] Other Clinician: [name] Referring Jamela Cumbo: Treating Jeffree Cazeau/Extender:[name], [name], [name] in Treatment: 2 HBO Safety Checklist Items Safety Checklist Consent Form Signed Patient voided / foley secured and emptied When did you last eato n/a Last dose of injectable or oral agent n/a NA Ostomy pouch emptied and vented if applicable NA All implantable devices assessed, documented and approved NA Intravenous access site secured and place Valuables secured Linens and cotton and cotton/polyester blend (less than 51% polyester) Personal oil-based products / skin lotions / body lotions removed NA Wigs or hairpieces removed NA Smoking or tobacco materials removed Books / newspapers / magazines / loose paper removed Cologne, aftershave, perfume and deodorant removed Jewelry removed (may wrap wedding band) NA Make-up removed Hair care products removed NA Battery operated devices (external) removed NA Heating patches and chemical warmers removed NA Titanium eyewear removed NA Nail polish cured greater than 10 hours NA Casting material cured greater than 10 hours NA Hearing aids removed NA Loose dentures or partials removed NA Prosthetics have been removed Patient demonstrates correct  use of air break device (if applicable) Patient concerns have been addressed Patient grounding bracelet on and cord attached to chamber Specifics for Inpatients (complete in addition to above) Medication sheet sent with patient Intravenous medications needed or due during therapy sent with patient Drainage tubes (e.g. nasogastric tube or chest tube secured and vented) Endotracheal or Tracheotomy tube secured Cuff deflated of air and inflated with saline  Airway suctioned Electronic Signature(s) Signed: 10/19/2019 8:27:51 AM By: [name] EMT/HBOT Entered By: [name] on 10/19/2019 08:27:50

## 2019-10-19 NOTE — Progress Notes (Signed)
MARV, BOCKUS ([id number]) Visit Report for 10/19/2019 SuperBill Details Patient Name: Date of Service: [name], APA 10/19/2019 Medical Record [id number] Patient Account Number: [id number] Date of Birth/Sex: Treating RN: 1946-05-10 (74 y.o. 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

## 2019-10-22 ENCOUNTER — Encounter (HOSPITAL_BASED_OUTPATIENT_CLINIC_OR_DEPARTMENT_OTHER): Payer: Medicare Other | Admitting: Internal Medicine

## 2019-10-22 ENCOUNTER — Other Ambulatory Visit: Payer: Self-pay

## 2019-10-22 DIAGNOSIS — N3041 Irradiation cystitis with hematuria: Secondary | ICD-10-CM | POA: Diagnosis not present

## 2019-10-22 NOTE — Progress Notes (Signed)
ESAI, THAUT ([id number]) Visit Report for 10/22/2019 HBO Safety Checklist Details Patient Name: Date of Service: KOLBI, SHETTY 10/22/2019 8:00 AM Medical Record [id number] Patient Account Number: [id number] Date of Birth/Sex: Treating RN: 05-08-1946 (74 y.o. [name], [name] Primary Care Terrica Duecker: [name] Other Clinician: [name] Referring Estee Yohe: Treating Alylah Blakney/Extender:[name], [name], [name] in Treatment: 2 HBO Safety Checklist Items Safety Checklist Consent Form Signed Patient voided / foley secured and emptied When did you last eato n/a Last dose of injectable or oral agent n/a NA Ostomy pouch emptied and vented if applicable NA All implantable devices assessed, documented and approved NA Intravenous access site secured and place Valuables secured Linens and cotton and cotton/polyester blend (less than 51% polyester) Personal oil-based products / skin lotions / body lotions removed NA Wigs or hairpieces removed NA Smoking or tobacco materials removed Books / newspapers / magazines / loose paper removed Cologne, aftershave, perfume and deodorant removed Jewelry removed (may wrap wedding band) NA Make-up removed Hair care products removed NA Battery operated devices (external) removed NA Heating patches and chemical warmers removed NA Titanium eyewear removed NA Nail polish cured greater than 10 hours NA Casting material cured greater than 10 hours NA Hearing aids removed NA Loose dentures or partials removed NA Prosthetics have been removed Patient demonstrates correct use of air break device (if applicable) Patient concerns have been addressed Patient grounding bracelet on and cord attached to chamber Specifics for Inpatients (complete in addition to above) Medication sheet sent with patient Intravenous medications needed or due during therapy sent with patient Drainage tubes (e.g. nasogastric tube or chest tube secured  and vented) Endotracheal or Tracheotomy tube secured Cuff deflated of air and inflated with saline Airway suctioned Electronic Signature(s) Signed: 10/22/2019 8:45:07 AM By: [name] EMT/HBOT Entered By: [name] on 10/22/2019 08:45:07

## 2019-10-22 NOTE — Progress Notes (Signed)
[name], [name] ([id number]) Visit Report for 10/22/2019 Arrival Information Details Patient Name: Date of Service: [name], [name] 10/22/2019 8:00 AM Medical Record [id number] Patient Account Number: [id number] Date of Birth/Sex: Treating RN: 01-02-1946 (74 y.o. [name], [name] Primary Care Kambrie Eddleman: [name] Other Clinician: [name] Referring Kaylan Friedmann: Treating Tarita Deshmukh/Extender:[name], [name], [name] in Treatment: 2 Visit Information History Since Last Visit Added or deleted any medications: No Patient Arrived: Ambulatory Any new allergies or adverse reactions: No Arrival Time: 07:55 Had a fall or experienced change in No Accompanied By: self activities of daily living that may affect Transfer Assistance: None risk of falls: Patient Identification Verified: Yes Signs or symptoms of abuse/neglect since last No Secondary Verification Process Yes visito Completed: Hospitalized since last visit: No Patient Requires Transmission-Based No Implantable device outside of the clinic excluding No Precautions: cellular tissue based products placed in the center Patient Has Alerts: No since last visit: Pain Present Now: No Electronic Signature(s) Signed: 10/22/2019 4:33:06 PM By: [name] EMT/HBOT Entered By: [name] on 10/22/2019 08:44:07 -------------------------------------------------------------------------------- Encounter Discharge Information Details Patient Name: Date of Service: [name], [name] 10/22/2019 8:00 AM Medical Record [id number] Patient Account Number: [id number] Date of Birth/Sex: Treating RN: 03-22-1946 (74 y.o. [name] Primary Care Malinda Mayden: [name] Other Clinician: [name] Referring Karlo Goeden: Treating Kaidan Spengler/Extender:[name], [name], [name] in Treatment: 2 Encounter Discharge Information Items Discharge Condition: Stable Ambulatory Status: Ambulatory Discharge  Destination: Home Transportation: Private Auto Accompanied By: self Schedule Follow-up Appointment: Yes Clinical Summary of Care: Patient Declined Electronic Signature(s) Signed: 10/22/2019 4:33:06 PM By: [name] EMT/HBOT Entered By: [name] on 10/22/2019 12:52:36 -------------------------------------------------------------------------------- Patient/Caregiver Education Details Patient Name: Date of Service: [name], [name] 2/22/2021andnbsp8:00 AM Medical Record [id number] Patient Account Number: [id number] Date of Birth/Gender: Treating RN: 1946-02-09 (73 y.o. [name] Primary Care Physician: [name] Other Clinician: [name] Referring Physician: Treating Physician/Extender:[name], [name], [name] in Treatment: 2 Education Assessment Education Provided To: Patient Education Topics Provided Hyperbaric Oxygenation: Methods: Explain/Verbal Responses: State content correctly Electronic Signature(s) Signed: 10/22/2019 4:33:06 PM By: [name] EMT/HBOT Entered By: [name] on 10/22/2019 12:52:22 -------------------------------------------------------------------------------- Vitals Details Patient Name: Date of Service: [name], [name] 10/22/2019 8:00 AM Medical Record [id number] Patient Account Number: [id number] Date of Birth/Sex: Treating RN: [birth year] (73 y.o. [name] Primary Care Aiesha Leland: [name] Other Clinician: [name] Referring Khiry Pasquariello: Treating Aryaa Bunting/Extender:[name], [name], [name] in Treatment: 2 Vital Signs Time Taken: 08:00 Temperature (F): 98.2 Height (in): 72 Pulse (bpm): 52 Weight (lbs): 240 Respiratory Rate (breaths/min): 14 Body Mass Index (BMI): 32.5 Blood Pressure (mmHg): 147/74 Reference Range: 80 - 120 mg / dl Electronic Signature(s) Signed: 10/22/2019 4:33:06 PM By: [name] EMT/HBOT Entered By: [name] on 10/22/2019  08:44:31

## 2019-10-23 ENCOUNTER — Encounter (HOSPITAL_BASED_OUTPATIENT_CLINIC_OR_DEPARTMENT_OTHER): Payer: Medicare Other | Admitting: Internal Medicine

## 2019-10-23 DIAGNOSIS — N3041 Irradiation cystitis with hematuria: Secondary | ICD-10-CM | POA: Diagnosis not present

## 2019-10-23 NOTE — Progress Notes (Addendum)
TARRANT, NYGAARD ([id number]) Visit Report for 10/23/2019 HBO Details Patient Name: Date of Service: [name], [name] 10/23/2019 8:00 AM Medical Record [id number] Patient Account Number: [id number] Date of Birth/Sex: Treating RN: [birth year] (73 y.o. [name]) [name] Primary Care Eh Sesay: [name] Other Clinician: [name] Referring Marton Malizia: Treating Keirsten Matuska/Extender:[name], [name], [name] in Treatment: 2 HBO Treatment Course Details Treatment Course Number: 1 Ordering Curly Mackowski: [name] Total Treatments Ordered: 40 HBO Treatment Start Date: 10/15/2019 HBO Indication: Late Effect of Radiation HBO Treatment Details Treatment Number: 6 Patient Type: Outpatient Chamber Type: Monoplace Chamber Serial #: [id number] Treatment Protocol: 2.5 ATA with 90 minutes oxygen, with two 5 minute air breaks Treatment Details Compression Rate Down: 2.0 psi / minute De-Compression Rate Up: 2.0 psi / minute Air breaks and CompressTx Pressure breathing periods DecompressDecompress Begins Reached (leave unused spaces Begins Ends blank) Chamber Pressure (ATA)1 2.5 2.5 2.5 2.5 2.5 --2.5 1 Clock Time (24 hr) 08:26 08:38 [id number] 10:30 Treatment Length: 124 (minutes) Treatment Segments: 4 Vital Signs Capillary Blood Glucose Reference Range: 80 - 120 mg / dl HBO Diabetic Blood Glucose Intervention Range: <131 mg/dl or >249 mg/dl Time Vitals Blood Respiratory Capillary Blood Glucose Pulse Action Type: Pulse: Temperature: Taken: Pressure: Rate: Glucose (mg/dl): Meter #: Oximetry (%) Taken: Pre 08:10 144/59 52 15 98.5 Post 10:32 148/68 55 16 98 Treatment Response Treatment Toleration: Well Treatment Completion Treatment Completed without Adverse Event Status: Arlyn Buerkle Notes No concerns with treatment given Physician HBO Attestation: I certify that I supervised this HBO treatment in accordance with Medicare guidelines. A  trained Yes Yes emergency response team is readily available per hospital policies and procedures. Continue HBOT as ordered. Yes Electronic Signature(s) Signed: 10/23/2019 6:00:34 PM By: [name] MD Previous Signature: 10/23/2019 5:16:59 PM Version By: [name] EMT/HBOT Entered By: [name] on 10/23/2019 17:26:51 -------------------------------------------------------------------------------- HBO Safety Checklist Details Patient Name: Date of Service: [name], [name] 10/23/2019 8:00 AM Medical Record [id number] Patient Account Number: [id number] Date of Birth/Sex: Treating RN: 1946/04/06 (73 y.o. [name]) [name] Primary Care Everet Flagg: [name] Other Clinician: [name] Referring Durga Saldarriaga: Treating Cleven Jansma/Extender:[name], [name], [name] in Treatment: 2 HBO Safety Checklist Items Safety Checklist Consent Form Signed Patient voided / foley secured and emptied When did you last eato n/a Last dose of injectable or oral agent n/a NA Ostomy pouch emptied and vented if applicable NA All implantable devices assessed, documented and approved NA Intravenous access site secured and place Valuables secured Linens and cotton and cotton/polyester blend (less than 51% polyester) Personal oil-based products / skin lotions / body lotions removed NA Wigs or hairpieces removed NA Smoking or tobacco materials removed Books / newspapers / magazines / loose paper removed Cologne, aftershave, perfume and deodorant removed Jewelry removed (may wrap wedding band) NA Make-up removed Hair care products removed NA Battery operated devices (external) removed NA Heating patches and chemical warmers removed NA Titanium eyewear removed NA Nail polish cured greater than 10 hours NA Casting material cured greater than 10 hours NA Hearing aids removed NA Loose dentures or partials removed NA Prosthetics have been removed Patient demonstrates correct use  of air break device (if applicable) Patient concerns have been addressed Patient grounding bracelet on and cord attached to chamber Specifics for Inpatients (complete in addition to above) Medication sheet sent with patient Intravenous medications needed or due during therapy sent with patient Drainage tubes (e.g. nasogastric tube or chest tube secured and vented) Endotracheal or Tracheotomy tube secured Cuff deflated of air and inflated with saline  Airway suctioned Electronic Signature(s) Signed: 10/23/2019 8:32:19 AM By: [name] EMT/HBOT Entered By: [name] on 10/23/2019 08:32:18

## 2019-10-23 NOTE — Progress Notes (Signed)
DRELON, PRINTY ([id number]) Visit Report for 10/23/2019 SuperBill Details Patient Name: Date of Service: [name], [name] 10/23/2019 Medical Record [id number] Patient Account Number: [id number] Date of Birth/Sex: Treating RN: May 31, 1946 (73 y.o. 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

## 2019-10-23 NOTE — Progress Notes (Signed)
LEELYND, ROMUALDO ([id number]) Visit Report for 10/23/2019 Arrival Information Details Patient Name: Date of Service: ERMIL, SAXMAN 10/23/2019 8:00 AM Medical Record [id number] Patient Account Number: [id number] Date of Birth/Sex: Treating RN: Jan 11, 1946 (73 y.o. [name]) [name] Primary Care Macari Zalesky: [name] Other Clinician: [name] Referring Fard Borunda: Treating Mailynn Everly/Extender:[name], [name], [name] in Treatment: 2 Visit Information History Since Last Visit Added or deleted any medications: No Patient Arrived: Ambulatory Any new allergies or adverse reactions: No Arrival Time: 08:05 Had a fall or experienced change in No Accompanied By: self activities of daily living that may affect Transfer Assistance: None risk of falls: Patient Identification Verified: Yes Signs or symptoms of abuse/neglect since last No Secondary Verification Process Yes visito Completed: Hospitalized since last visit: No Patient Requires Transmission-Based No Implantable device outside of the clinic excluding No Precautions: cellular tissue based products placed in the center Patient Has Alerts: No since last visit: Pain Present Now: No Electronic Signature(s) Signed: 10/23/2019 5:16:59 PM By: [name] EMT/HBOT Entered By: [name] on 10/23/2019 08:31:16 -------------------------------------------------------------------------------- Encounter Discharge Information Details Patient Name: Date of Service: WORDEN, SCHULTS 10/23/2019 8:00 AM Medical Record [id number] Patient Account Number: [id number] Date of Birth/Sex: Treating RN: 09/22/1945 (73 y.o. [name]) [name] Primary Care Dameon Soltis: [name] Other Clinician: [name] Referring Erie Sica: Treating Zailyn Rowser/Extender:[name], [name], [name] in Treatment: 2 Encounter Discharge Information Items Discharge Condition: Stable Ambulatory Status: Ambulatory Discharge  Destination: Home Transportation: Private Auto Accompanied By: self Schedule Follow-up Appointment: Yes Clinical Summary of Care: Patient Declined Electronic Signature(s) Signed: 10/23/2019 5:16:59 PM By: [name] EMT/HBOT Entered By: [name] on 10/23/2019 11:35:36 -------------------------------------------------------------------------------- Patient/Caregiver Education Details Patient Name: Date of Service: Sloane, Vikas 2/23/2021andnbsp8:00 AM Medical Record [id number] Patient Account Number: [id number] Date of Birth/Gender: Treating RN: Sep 18, 1945 (73 y.o. Oval Linsey Primary Care Physician: [name] Other Clinician: [name] Referring Physician: Treating Physician/Extender:[name], [name], [name] in Treatment: 2 Education Assessment Education Provided To: Patient Education Topics Provided Hyperbaric Oxygenation: Methods: Explain/Verbal Responses: State content correctly Electronic Signature(s) Signed: 10/23/2019 5:16:59 PM By: [name] EMT/HBOT Entered By: [name] on 10/23/2019 11:35:21 -------------------------------------------------------------------------------- Vitals Details Patient Name: Date of Service: [name] 10/23/2019 8:00 AM Medical Record [id number] Patient Account Number: [id number] Date of Birth/Sex: Treating RN: [birth year] (73 y.o. [name]) [name] Primary Care Belinda Bringhurst: [name] Other Clinician: [name] Referring Peaches Vanoverbeke: Treating Valencia Kassa/Extender:[name], [name], [name] in Treatment: 2 Vital Signs Time Taken: 08:10 Temperature (F): 98.5 Height (in): 72 Pulse (bpm): 52 Weight (lbs): 240 Respiratory Rate (breaths/min): 15 Body Mass Index (BMI): 32.5 Blood Pressure (mmHg): 144/59 Reference Range: 80 - 120 mg / dl Electronic Signature(s) Signed: 10/23/2019 5:16:59 PM By: [name] EMT/HBOT Entered By: [name] on 10/23/2019  08:31:37

## 2019-10-24 ENCOUNTER — Encounter (HOSPITAL_BASED_OUTPATIENT_CLINIC_OR_DEPARTMENT_OTHER): Payer: Medicare Other | Admitting: Physician Assistant

## 2019-10-24 ENCOUNTER — Other Ambulatory Visit: Payer: Self-pay

## 2019-10-24 DIAGNOSIS — N3041 Irradiation cystitis with hematuria: Secondary | ICD-10-CM | POA: Diagnosis not present

## 2019-10-24 NOTE — Progress Notes (Signed)
KLINT, KAAIHUE ([id number]) Visit Report for 10/24/2019 Arrival Information Details Patient Name: Date of Service: [name], [name] 10/24/2019 8:00 AM Medical Record [id number] Patient Account Number: [id number] Date of Birth/Sex: Treating RN: 07/12/1946 (74 y.o. [name] Primary Care Sunset Joshi: [name] Other Clinician: [name] Referring Lealon Vanputten: Treating Mariadel Mruk/Extender:Stone III, [name], [name] in Treatment: 2 Visit Information History Since Last Visit Added or deleted any medications: No Patient Arrived: Ambulatory Any new allergies or adverse reactions: No Arrival Time: 07:50 Had a fall or experienced change in No Accompanied By: self activities of daily living that may affect Transfer Assistance: None risk of falls: Patient Identification Verified: Yes Signs or symptoms of abuse/neglect since last No Secondary Verification Process Yes visito Completed: Hospitalized since last visit: No Patient Requires Transmission-Based No Implantable device outside of the clinic excluding No Precautions: cellular tissue based products placed in the center Patient Has Alerts: No since last visit: Pain Present Now: No Electronic Signature(s) Signed: 10/24/2019 5:09:01 PM By: [name] EMT/HBOT Entered By: [name] on 10/24/2019 08:25:01 -------------------------------------------------------------------------------- Encounter Discharge Information Details Patient Name: Date of Service: [name], [name] 10/24/2019 8:00 AM Medical Record [id number] Patient Account Number: [id number] Date of Birth/Sex: Treating RN: [birth year] (74 y.o. [name] Primary Care Khalfani Weideman: [name] Other Clinician: [name] Referring Neftaly Swiss: Treating Ki Luckman/Extender:Stone III, [name], [name] in Treatment: 2 Encounter Discharge Information Items Discharge Condition: Stable Ambulatory Status: Ambulatory Discharge  Destination: Home Transportation: Private Auto Accompanied By: self Schedule Follow-up Appointment: Yes Clinical Summary of Care: Patient Declined Electronic Signature(s) Signed: 10/24/2019 5:09:01 PM By: [name] EMT/HBOT Entered By: [name] on 10/24/2019 11:16:10 -------------------------------------------------------------------------------- Patient/Caregiver Education Details Patient Name: Date of Service: [name], [name] 2/24/2021andnbsp8:00 AM Medical Record [id number] Patient Account Number: [id number] Date of Birth/Gender: Treating RN: May 25, 1946 (74 y.o. [name] Primary Care Physician: [name] Other Clinician: [name] Referring Physician: Treating Physician/Extender:Stone III, [name], [name] in Treatment: 2 Education Assessment Education Provided To: Patient Education Topics Provided Hyperbaric Oxygenation: Methods: Explain/Verbal Responses: State content correctly Electronic Signature(s) Signed: 10/24/2019 5:09:01 PM By: [name] EMT/HBOT Entered By: [name] on 10/24/2019 11:15:52 -------------------------------------------------------------------------------- Vitals Details Patient Name: Date of Service: [name] 10/24/2019 8:00 AM Medical Record [id number] Patient Account Number: [id number] Date of Birth/Sex: Treating RN: 20-Oct-1945 (74 y.o. [name] Primary Care Marda Breidenbach: [name] Other Clinician: [name] Referring Nina Mondor: Treating Delyla Sandeen/Extender:Stone III, [name], [name] in Treatment: 2 Vital Signs Time Taken: 07:55 Temperature (F): 97.8 Height (in): 72 Pulse (bpm): 65 Weight (lbs): 240 Respiratory Rate (breaths/min): 14 Body Mass Index (BMI): 32.5 Blood Pressure (mmHg): 124/57 Reference Range: 80 - 120 mg / dl Electronic Signature(s) Signed: 10/24/2019 5:09:01 PM By: [name] EMT/HBOT Entered By: [name] on 10/24/2019  08:25:20

## 2019-10-24 NOTE — Progress Notes (Addendum)
PREM, JACOBUCCI ([id number]) Visit Report for 10/24/2019 HBO Details Patient Name: Date of Service: [name], [name] 10/24/2019 8:00 AM Medical Record [id number] Patient Account Number: [id number] Date of Birth/Sex: Treating RN: December 03, 1945 (74 y.o. [name] Primary Care [name]: [name] Other Clinician: [name] Referring [name]: Treating [name]/Extender:Stone III, [name], [name] in Treatment: 2 HBO Treatment Course Details Treatment Course Number: 1 Ordering [name]: [name] Total Treatments Ordered: 40 HBO Treatment Start Date: 10/15/2019 HBO Indication: Late Effect of Radiation HBO Treatment Details Treatment Number: 7 Patient Type: Outpatient Chamber Type: Monoplace Chamber Serial #: [id number] Treatment Protocol: 2.5 ATA with 90 minutes oxygen, with two 5 minute air breaks Treatment Details Compression Rate Down: 2.0 psi / minute De-Compression Rate Up: 2.0 psi / minute Air breaks and CompressTx Pressure breathing periods DecompressDecompress Begins Reached (leave unused spaces Begins Ends blank) Chamber Pressure (ATA)1 2.5 2.5 2.5 2.5 2.5 --2.5 1 Clock Time (24 hr) 08:02 08:14 [id number] 10:06 Treatment Length: 124 (minutes) Treatment Segments: 4 Vital Signs Capillary Blood Glucose Reference Range: 80 - 120 mg / dl HBO Diabetic Blood Glucose Intervention Range: <131 mg/dl or >249 mg/dl Time Vitals Blood Respiratory Capillary Blood Glucose Pulse Action Type: Pulse: Temperature: Taken: Pressure: Rate: Glucose (mg/dl): Meter #: Oximetry (%) Taken: Pre 07:55 124/57 65 14 97.8 Post 10:08 139/72 54 14 98.2 Treatment Response Treatment Toleration: Well Treatment Completion Treatment Completed without Adverse Event Status: Physician HBO Attestation: I certify that I supervised this HBO treatment in accordance with Medicare guidelines. A trained Yes emergency response team is readily available  per hospital policies and procedures. Continue HBOT as ordered. Yes Electronic Signature(s) Signed: 10/24/2019 5:46:44 PM By: Worthy Keeler PA-C Previous Signature: 10/24/2019 5:09:01 PM Version By: [name] EMT/HBOT Entered By: Worthy Keeler on 10/24/2019 17:46:44 -------------------------------------------------------------------------------- HBO Safety Checklist Details Patient Name: Date of Service: [name], [name] 10/24/2019 8:00 AM Medical Record [id number] Patient Account Number: [id number] Date of Birth/Sex: Treating RN: 11-16-1945 (74 y.o. [name] Primary Care Jaree Trinka: [name] Other Clinician: [name] Referring Richie Vadala: Treating Shelagh Rayman/Extender:Stone III, [name], [name] in Treatment: 2 HBO Safety Checklist Items Safety Checklist Consent Form Signed Patient voided / foley secured and emptied When did you last eato n/a Last dose of injectable or oral agent n/a NA Ostomy pouch emptied and vented if applicable NA All implantable devices assessed, documented and approved NA Intravenous access site secured and place Valuables secured Linens and cotton and cotton/polyester blend (less than 51% polyester) Personal oil-based products / skin lotions / body lotions removed NA Wigs or hairpieces removed NA Smoking or tobacco materials removed Books / newspapers / magazines / loose paper removed Cologne, aftershave, perfume and deodorant removed Jewelry removed (may wrap wedding band) NA Make-up removed Hair care products removed NA Battery operated devices (external) removed NA Heating patches and chemical warmers removed NA Titanium eyewear removed NA Nail polish cured greater than 10 hours NA Casting material cured greater than 10 hours NA Hearing aids removed NA Loose dentures or partials removed NA Prosthetics have been removed Patient demonstrates correct use of air break device (if applicable) Patient concerns have  been addressed Patient grounding bracelet on and cord attached to chamber Specifics for Inpatients (complete in addition to above) Medication sheet sent with patient Intravenous medications needed or due during therapy sent with patient Drainage tubes (e.g. nasogastric tube or chest tube secured and vented) Endotracheal or Tracheotomy tube secured Cuff deflated of air and inflated with saline Airway suctioned Electronic Signature(s)  Signed: 10/24/2019 8:25:59 AM By: [name] EMT/HBOT Entered By: [name] on 10/24/2019 08:25:59

## 2019-10-24 NOTE — Progress Notes (Signed)
CURTEZ, HUBLEY ([id number]) Visit Report for 10/24/2019 Problem List Details Patient Name: Date of Service: [name], [name] 10/24/2019 8:00 AM Medical Record [id number] Patient Account Number: [id number] Date of Birth/Sex: Treating RN: 1945/10/22 (74 y.o. 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

## 2019-10-25 ENCOUNTER — Encounter (HOSPITAL_BASED_OUTPATIENT_CLINIC_OR_DEPARTMENT_OTHER): Payer: Medicare Other | Admitting: Internal Medicine

## 2019-10-25 DIAGNOSIS — N3041 Irradiation cystitis with hematuria: Secondary | ICD-10-CM | POA: Diagnosis not present

## 2019-10-25 NOTE — Progress Notes (Signed)
BABACAR, BELL ([id number]) Visit Report for 10/25/2019 Arrival Information Details Patient Name: Date of Service: [name], [name] 10/25/2019 8:00 AM Medical Record [id number] Patient Account Number: [id number] Date of Birth/Sex: Treating RN: [birth year] (74 y.o. [name], [name] Primary Care Harless Molinari: [name] Other Clinician: [name] Referring Elgar Scoggins: Treating Callista Hoh/Extender:[name], [name], [name] in Treatment: 2 Visit Information History Since Last Visit Added or deleted any medications: No Patient Arrived: Ambulatory Any new allergies or adverse reactions: No Arrival Time: 07:55 Had a fall or experienced change in No Accompanied By: self activities of daily living that may affect Transfer Assistance: None risk of falls: Patient Identification Verified: Yes Signs or symptoms of abuse/neglect since last No Secondary Verification Process Yes visito Completed: Hospitalized since last visit: No Patient Requires Transmission-Based No Implantable device outside of the clinic excluding No Precautions: cellular tissue based products placed in the center Patient Has Alerts: No since last visit: Pain Present Now: No Electronic Signature(s) Signed: 10/25/2019 5:25:06 PM By: [name] EMT/HBOT Entered By: [name] on 10/25/2019 08:14:58 -------------------------------------------------------------------------------- Encounter Discharge Information Details Patient Name: Date of Service: [name], [name] 10/25/2019 8:00 AM Medical Record [id number] Patient Account Number: [id number] Date of Birth/Sex: Treating RN: 12-Nov-1945 (74 y.o. [name] Primary Care Maleeyah Mccaughey: [name] Other Clinician: [name] Referring Athenia Rys: Treating Soua Caltagirone/Extender:[name], [name], [name] in Treatment: 2 Encounter Discharge Information Items Discharge Condition: Stable Ambulatory Status: Ambulatory Discharge  Destination: Home Transportation: Private Auto Accompanied By: self Schedule Follow-up Appointment: Yes Clinical Summary of Care: Patient Declined Electronic Signature(s) Signed: 10/25/2019 5:25:06 PM By: [name] EMT/HBOT Entered By: [name] on 10/25/2019 10:44:48 -------------------------------------------------------------------------------- Patient/Caregiver Education Details Patient Name: Date of Service: [name], [name] 2/25/2021andnbsp8:00 AM Medical Record [id number] Patient Account Number: [id number] Date of Birth/Gender: Treating RN: 05-May-1946 (73 y.o. [name] Primary Care Physician: [name] Other Clinician: [name] Referring Physician: Treating Physician/Extender:[name], [name], [name] in Treatment: 2 Education Assessment Education Provided To: Patient Education Topics Provided Hyperbaric Oxygenation: Methods: Explain/Verbal Responses: State content correctly Electronic Signature(s) Signed: 10/25/2019 5:25:06 PM By: [name] EMT/HBOT Entered By: [name] on 10/25/2019 10:44:36 -------------------------------------------------------------------------------- Vitals Details Patient Name: Date of Service: [name] 10/25/2019 8:00 AM Medical Record [id number] Patient Account Number: [id number] Date of Birth/Sex: Treating RN: 1946-04-09 (73 y.o. [name], [name] Primary Care Sriya Kroeze: [name] Other Clinician: [name] Referring Chrisean Kloth: Treating Francesca Strome/Extender:[name], [name], [name] in Treatment: 2 Vital Signs Time Taken: 08:00 Temperature (F): 98.1 Height (in): 72 Pulse (bpm): 52 Weight (lbs): 240 Respiratory Rate (breaths/min): 17 Body Mass Index (BMI): 32.5 Blood Pressure (mmHg): 146/62 Reference Range: 80 - 120 mg / dl Electronic Signature(s) Signed: 10/25/2019 5:25:06 PM By: [name] EMT/HBOT Entered By: [name] on 10/25/2019  08:15:13

## 2019-10-25 NOTE — Progress Notes (Signed)
[name], [name] ([id number]) Visit Report for 10/25/2019 SuperBill Details Patient Name: Date of Service: [name], [name] 10/25/2019 Medical Record [id number] Patient Account Number: [id number] Date of Birth/Sex: Treating RN: Dec 07, 1945 (74 y.o. 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

## 2019-10-25 NOTE — Progress Notes (Addendum)
[name], [name] ([id number]) Visit Report for 10/25/2019 HBO Details Patient Name: Date of Service: [name], [name] 10/25/2019 8:00 AM Medical Record [id number] Patient Account Number: [id number] Date of Birth/Sex: Treating RN: 13-Mar-1946 (74 y.o. [name], [name] Primary Care Nuria Phebus: [name] Other Clinician: [name] Referring Tangela Dolliver: Treating Anquinette Pierro/Extender:[name], [name], [name] in Treatment: 2 HBO Treatment Course Details Treatment Course Number: 1 Ordering [name] Kirk: [name] Total Treatments Ordered: 40 HBO Treatment Start Date: 10/15/2019 HBO Indication: Late Effect of Radiation HBO Treatment Details Treatment Number: 8 Patient Type: Outpatient Chamber Type: Monoplace Chamber Serial #: [id number] Treatment Protocol: 2.5 ATA with 90 minutes oxygen, with two 5 minute air breaks Treatment Details Compression Rate Down: 2.0 psi / minute De-Compression Rate Up: 2.0 psi / minute Air breaks and CompressTx Pressure breathing periods DecompressDecompress Begins Reached (leave unused spaces Begins Ends blank) Chamber Pressure (ATA)1 2.5 2.5 2.5 2.5 2.5 --2.5 1 Clock Time (24 hr) 08:06 08:18 [id number] 10:10 Treatment Length: 124 (minutes) Treatment Segments: 4 Vital Signs Capillary Blood Glucose Reference Range: 80 - 120 mg / dl HBO Diabetic Blood Glucose Intervention Range: <131 mg/dl or >249 mg/dl Time Vitals Blood Respiratory Capillary Blood Glucose Pulse Action Type: Pulse: Temperature: Taken: Pressure: Rate: Glucose (mg/dl): Meter #: Oximetry (%) Taken: Pre 08:00 146/62 52 17 98.1 Post 10:12 163/73 55 15 97.8 Treatment Response Treatment Toleration: Well Treatment Completion Treatment Completed without Adverse Event Status: [name] Notes No concerns with treatment given Physician HBO Attestation: I certify that I supervised this HBO treatment in accordance with Medicare guidelines. A  trained Yes Yes emergency response team is readily available per hospital policies and procedures. Continue HBOT as ordered. Yes Electronic Signature(s) Signed: 10/25/2019 5:41:09 PM By: [name] MD Previous Signature: 10/25/2019 5:25:06 PM Version By: [name] EMT/HBOT Entered By: [name] on 10/25/2019 17:38:13 -------------------------------------------------------------------------------- HBO Safety Checklist Details Patient Name: Date of Service: [name] 10/25/2019 8:00 AM Medical Record [id number] Patient Account Number: [id number] Date of Birth/Sex: Treating RN: 05/22/1946 (74 y.o. [name], [name] Primary Care Coretha Creswell: [name] Other Clinician: [name] Referring Detroit Frieden: Treating Smita Lesh/Extender:[name], [name], [name] in Treatment: 2 HBO Safety Checklist Items Safety Checklist Consent Form Signed Patient voided / foley secured and emptied When did you last eato n/a Last dose of injectable or oral agent n/a NA Ostomy pouch emptied and vented if applicable NA All implantable devices assessed, documented and approved NA Intravenous access site secured and place Valuables secured Linens and cotton and cotton/polyester blend (less than 51% polyester) Personal oil-based products / skin lotions / body lotions removed NA Wigs or hairpieces removed NA Smoking or tobacco materials removed Books / newspapers / magazines / loose paper removed Cologne, aftershave, perfume and deodorant removed Jewelry removed (may wrap wedding band) NA Make-up removed Hair care products removed NA Battery operated devices (external) removed NA Heating patches and chemical warmers removed NA Titanium eyewear removed NA Nail polish cured greater than 10 hours NA Casting material cured greater than 10 hours NA Hearing aids removed NA Loose dentures or partials removed NA Prosthetics have been removed Patient demonstrates correct use  of air break device (if applicable) Patient concerns have been addressed Patient grounding bracelet on and cord attached to chamber Specifics for Inpatients (complete in addition to above) Medication sheet sent with patient Intravenous medications needed or due during therapy sent with patient Drainage tubes (e.g. nasogastric tube or chest tube secured and vented) Endotracheal or Tracheotomy tube secured Cuff deflated of air and inflated with saline  Airway suctioned Electronic Signature(s) Signed: 10/25/2019 8:15:49 AM By: [name] EMT/HBOT Entered By: [name] on 10/25/2019 08:15:49

## 2019-10-26 ENCOUNTER — Other Ambulatory Visit: Payer: Self-pay

## 2019-10-26 ENCOUNTER — Encounter (HOSPITAL_BASED_OUTPATIENT_CLINIC_OR_DEPARTMENT_OTHER): Payer: Medicare Other | Admitting: Internal Medicine

## 2019-10-26 DIAGNOSIS — N3041 Irradiation cystitis with hematuria: Secondary | ICD-10-CM | POA: Diagnosis not present

## 2019-10-26 NOTE — Progress Notes (Signed)
XERXES, NASON ([id number]) Visit Report for 10/26/2019 SuperBill Details Patient Name: Date of Service: [name], [name] 10/26/2019 Medical Record [id number] Patient Account Number: [id number] Date of Birth/Sex: Treating RN: 1945-11-17 (74 y.o. 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

## 2019-10-26 NOTE — Progress Notes (Signed)
HALEN, KILBY ([id number]) Visit Report for 10/26/2019 Arrival Information Details Patient Name: Date of Service: [name], [name] 10/26/2019 8:00 AM Medical Record [id number] Patient Account Number: [id number] Date of Birth/Sex: Treating RN: April 10, 1946 (74 y.o. [name] Primary Care Lear Carstens: [name] Other Clinician: [name] Referring Reinaldo Helt: Treating Gleen Ripberger/Extender:[name], [name], [name] in Treatment: 3 Visit Information History Since Last Visit Added or deleted any medications: No Patient Arrived: Ambulatory Any new allergies or adverse reactions: No Arrival Time: 07:50 Had a fall or experienced change in No Accompanied By: self activities of daily living that may affect Transfer Assistance: None risk of falls: Patient Identification Verified: Yes Signs or symptoms of abuse/neglect since last No Secondary Verification Process Yes visito Completed: Hospitalized since last visit: No Patient Requires Transmission-Based No Implantable device outside of the clinic excluding No Precautions: cellular tissue based products placed in the center Patient Has Alerts: No since last visit: Pain Present Now: No Electronic Signature(s) Signed: 10/26/2019 5:12:28 PM By: [name] EMT/HBOT Entered By: [name] on 10/26/2019 08:16:58 -------------------------------------------------------------------------------- Encounter Discharge Information Details Patient Name: Date of Service: [name], [name] 10/26/2019 8:00 AM Medical Record [id number] Patient Account Number: [id number] Date of Birth/Sex: Treating RN: 11-25-1945 (74 y.o. [name] Primary Care Yatzary Merriweather: [name] Other Clinician: [name] Referring Arrin Pintor: Treating Maudine Kluesner/Extender:[name], [name], [name] in Treatment: 3 Encounter Discharge Information Items Discharge Condition: Stable Ambulatory Status: Ambulatory Discharge  Destination: Home Transportation: Private Auto Accompanied By: self Schedule Follow-up Appointment: Yes Clinical Summary of Care: Patient Declined Electronic Signature(s) Signed: 10/26/2019 5:12:28 PM By: [name] EMT/HBOT Entered By: [name] on 10/26/2019 11:21:42 -------------------------------------------------------------------------------- Patient/Caregiver Education Details Patient Name: Date of Service: [name], [name] 2/26/2021andnbsp8:00 AM Medical Record [id number] Patient Account Number: [id number] Date of Birth/Gender: Treating RN: [birth year] (73 y.o. [name] Primary Care Physician: [name] Other Clinician: [name] Referring Physician: Treating Physician/Extender:[name], [name], [name] in Treatment: 3 Education Assessment Education Provided To: Patient Education Topics Provided Hyperbaric Oxygenation: Methods: Explain/Verbal Responses: State content correctly Electronic Signature(s) Signed: 10/26/2019 5:12:28 PM By: [name] EMT/HBOT Entered By: [name] on 10/26/2019 11:20:50 -------------------------------------------------------------------------------- Vitals Details Patient Name: Date of Service: [name] 10/26/2019 8:00 AM Medical Record [id number] Patient Account Number: [id number] Date of Birth/Sex: Treating RN: April 09, 1946 (73 y.o. [name] Primary Care Renad Jenniges: [name] Other Clinician: [name] Referring Shell Blanchette: Treating Cinzia Devos/Extender:[name], [name], [name] in Treatment: 3 Vital Signs Time Taken: 07:55 Temperature (F): 97.9 Height (in): 72 Pulse (bpm): 57 Weight (lbs): 240 Respiratory Rate (breaths/min): 18 Body Mass Index (BMI): 32.5 Blood Pressure (mmHg): 153/69 Reference Range: 80 - 120 mg / dl Electronic Signature(s) Signed: 10/26/2019 5:12:28 PM By: [name] EMT/HBOT Entered By: [name] on 10/26/2019  08:17:19

## 2019-10-26 NOTE — Progress Notes (Addendum)
ABDALLA, ADORNETTO ([id number]) Visit Report for 10/26/2019 HBO Details Patient Name: Date of Service: [name], [name] 10/26/2019 8:00 AM Medical Record [id number] Patient Account Number: [id number] Date of Birth/Sex: Treating RN: 10-28-1945 (74 y.o. [name] Primary Care Yuta Cipollone: [name] Other Clinician: [name] Referring Maryagnes Carrasco: Treating Dayanira Giovannetti/Extender:[name], [name], [name] in Treatment: 3 HBO Treatment Course Details Treatment Course Number: 1 Ordering Rob Mciver: [name] Total Treatments Ordered: 40 HBO Treatment Start Date: 10/15/2019 HBO Indication: Late Effect of Radiation HBO Treatment Details Treatment Number: 9 Patient Type: Outpatient Chamber Type: Monoplace Chamber Serial #: [id number] Treatment Protocol: 2.5 ATA with 90 minutes oxygen, with two 5 minute air breaks Treatment Details Compression Rate Down: 2.0 psi / minute De-Compression Rate Up: 2.0 psi / minute Air breaks and CompressTx Pressure breathing periods DecompressDecompress Begins Reached (leave unused spaces Begins Ends blank) Chamber Pressure (ATA)1 2.5 2.5 2.5 2.5 2.5 --2.5 1 Clock Time (24 hr) 08:03 08:15 08:4508:5009:2009:25--09:55 10:07 Treatment Length: 124 (minutes) Treatment Segments: 4 Vital Signs Capillary Blood Glucose Reference Range: 80 - 120 mg / dl HBO Diabetic Blood Glucose Intervention Range: <131 mg/dl or >249 mg/dl Time Vitals Blood Respiratory Capillary Blood Glucose Pulse Action Type: Pulse: Temperature: Taken: Pressure: Rate: Glucose (mg/dl): Meter #: Oximetry (%) Taken: Pre 07:55 153/69 57 18 97.9 Post 10:10 150/82 55 17 98.4 Treatment Response Treatment Toleration: Well Treatment Completion Treatment Completed without Adverse Event Status: Coleta Grosshans Notes No concerns with treatment given Physician HBO Attestation: I certify that I supervised this HBO treatment in accordance with Medicare guidelines. A  trained Yes Yes emergency response team is readily available per hospital policies and procedures. Continue HBOT as ordered. Yes Electronic Signature(s) Signed: 10/26/2019 5:26:28 PM By: [name] MD Previous Signature: 10/26/2019 5:12:28 PM Version By: [name] EMT/HBOT Entered By: [name] on 10/26/2019 17:24:09 -------------------------------------------------------------------------------- HBO Safety Checklist Details Patient Name: Date of Service: [name], [name] 10/26/2019 8:00 AM Medical Record [id number] Patient Account Number: [id number] Date of Birth/Sex: Treating RN: 04-18-1946 (74 y.o. [name] Primary Care Collins Kerby: [name] Other Clinician: [name] Referring George Haggart: Treating Sneha Willig/Extender:[name], [name], [name] in Treatment: 3 HBO Safety Checklist Items Safety Checklist Consent Form Signed Patient voided / foley secured and emptied When did you last eato n/a Last dose of injectable or oral agent n/a NA Ostomy pouch emptied and vented if applicable NA All implantable devices assessed, documented and approved NA Intravenous access site secured and place Valuables secured Linens and cotton and cotton/polyester blend (less than 51% polyester) Personal oil-based products / skin lotions / body lotions removed NA Wigs or hairpieces removed NA Smoking or tobacco materials removed Books / newspapers / magazines / loose paper removed Cologne, aftershave, perfume and deodorant removed Jewelry removed (may wrap wedding band) NA Make-up removed Hair care products removed NA Battery operated devices (external) removed NA Heating patches and chemical warmers removed NA Titanium eyewear removed NA Nail polish cured greater than 10 hours NA Casting material cured greater than 10 hours NA Hearing aids removed NA Loose dentures or partials removed NA Prosthetics have been removed Patient demonstrates correct  use of air break device (if applicable) Patient concerns have been addressed Patient grounding bracelet on and cord attached to chamber Specifics for Inpatients (complete in addition to above) Medication sheet sent with patient Intravenous medications needed or due during therapy sent with patient Drainage tubes (e.g. nasogastric tube or chest tube secured and vented) Endotracheal or Tracheotomy tube secured Cuff deflated of air and inflated with saline  Airway suctioned Electronic Signature(s) Signed: 10/26/2019 8:18:01 AM By: [name] EMT/HBOT Entered By: [name] on 10/26/2019 08:18:00

## 2019-10-29 ENCOUNTER — Other Ambulatory Visit: Payer: Self-pay

## 2019-10-29 ENCOUNTER — Encounter (HOSPITAL_BASED_OUTPATIENT_CLINIC_OR_DEPARTMENT_OTHER): Payer: Medicare Other | Attending: Internal Medicine | Admitting: Internal Medicine

## 2019-10-29 DIAGNOSIS — Z510 Encounter for antineoplastic radiation therapy: Secondary | ICD-10-CM | POA: Diagnosis not present

## 2019-10-29 DIAGNOSIS — K520 Gastroenteritis and colitis due to radiation: Secondary | ICD-10-CM | POA: Insufficient documentation

## 2019-10-29 DIAGNOSIS — Z8546 Personal history of malignant neoplasm of prostate: Secondary | ICD-10-CM | POA: Diagnosis not present

## 2019-10-29 DIAGNOSIS — N3041 Irradiation cystitis with hematuria: Secondary | ICD-10-CM | POA: Diagnosis present

## 2019-10-29 NOTE — Progress Notes (Signed)
[name], [name] ([id number]) Visit Report for 10/29/2019 HBO Details Patient Name: Date of Service: [name], [name] 10/29/2019 8:00 AM Medical Record [id number] Patient Account Number: [id number] Date of Birth/Sex: Treating RN: 1946-06-22 (74 y.o. [name], [name] Primary Care Aniceto Kyser: [name] Other Clinician: Referring Hamna Asa: Treating Kyrra Prada/Extender:[name], [name], [name] in Treatment: 3 HBO Treatment Course Details Treatment Course Number: 1 Ordering Monifah Freehling: [name] Total Treatments Ordered: 40 HBO Treatment Start Date: 10/15/2019 HBO Indication: Late Effect of Radiation HBO Treatment Details Treatment Number: 10 Patient Type: Outpatient Chamber Type: Monoplace Chamber Serial #: [id number] Treatment Protocol: 2.5 ATA with 90 minutes oxygen, with two 5 minute air breaks Treatment Details Compression Rate Down: 2.5 psi / minute De-Compression Rate Up: 2.5 psi / minute Air breaks and CompressTx Pressure breathing periods DecompressDecompress Begins Reached (leave unused spaces Begins Ends blank) Chamber Pressure (ATA)1 2.5 2.5 2.5 2.5 2.5 --2.5 1 Clock Time (24 hr) 07:58 08:09 [id number] 09:59 Treatment Length: 121 (minutes) Treatment Segments: 4 Vital Signs Capillary Blood Glucose Reference Range: 80 - 120 mg / dl HBO Diabetic Blood Glucose Intervention Range: <131 mg/dl or >249 mg/dl Time Vitals Blood Respiratory Capillary Blood Glucose Pulse Action Type: Pulse: Temperature: Taken: Pressure: Rate: Glucose (mg/dl): Meter #: Oximetry (%) Taken: Pre 07:47 137/93 48 18 98.4 Post 10:01 145/67 45 16 98 Treatment Response Treatment Toleration: Well Treatment Completion Treatment Completed without Adverse Event Status: Jenaro Souder Notes No concerns with treatment given Physician HBO Attestation: I certify that I supervised this HBO treatment in accordance with Medicare guidelines. A trained Yes Yes emergency response  team is readily available per hospital policies and procedures. Continue HBOT as ordered. Yes Electronic Signature(s) Signed: 10/29/2019 5:45:55 PM By: [name] MD Entered By: [name] on 10/29/2019 16:42:35 -------------------------------------------------------------------------------- HBO Safety Checklist Details Patient Name: Date of Service: [name], [name] 10/29/2019 8:00 AM Medical Record [id number] Patient Account Number: [id number] Date of Birth/Sex: Treating RN: 01-May-1946 (74 y.o. [name], [name] Primary Care Ludia Gartland: [name] Other Clinician: Referring Tekela Garguilo: Treating Artrell Lawless/Extender:[name], [name], [name] in Treatment: 3 HBO Safety Checklist Items Safety Checklist Consent Form Signed Patient voided / foley secured and emptied When did you last eato this morning Last dose of injectable or oral agent n/a NA Ostomy pouch emptied and vented if applicable NA All implantable devices assessed, documented and approved NA Intravenous access site secured and place Valuables secured Linens and cotton and cotton/polyester blend (less than 51% polyester) Personal oil-based products / skin lotions / body lotions removed NA Wigs or hairpieces removed NA Smoking or tobacco materials removed Books / newspapers / magazines / loose paper removed Cologne, aftershave, perfume and deodorant removed Jewelry removed (may wrap wedding band) NA Make-up removed NA Hair care products removed NA Battery operated devices (external) removed NA Heating patches and chemical warmers removed Titanium eyewear removed NA Nail polish cured greater than 10 hours NA Casting material cured greater than 10 hours NA Hearing aids removed NA Loose dentures or partials removed NA Prosthetics have been removed Patient demonstrates correct use of air break device (if applicable) Patient concerns have been addressed Patient grounding bracelet on and cord attached  to chamber Specifics for Inpatients (complete in addition to above) Medication sheet sent with patient Intravenous medications needed or due during therapy sent with patient Drainage tubes (e.g. nasogastric tube or chest tube secured and vented) Endotracheal or Tracheotomy tube secured Cuff deflated of air and inflated with saline Airway suctioned Electronic Signature(s) Signed: 10/29/2019 5:15:23 PM By: Deon Pilling Entered By:  Deon Pilling on 10/29/2019 09:02:20

## 2019-10-29 NOTE — Progress Notes (Signed)
CEPEDA, FRANQUI ([id number]) Visit Report for 10/29/2019 Arrival Information Details Patient Name: Date of Service: [name], [name] 10/29/2019 8:00 AM Medical Record [id number] Patient Account Number: [id number] Date of Birth/Sex: Treating RN: 1946-05-20 (74 y.o. [name], [name] Primary Care Cloy Cozzens: [name] Other Clinician: Referring Zigmund Linse: Treating Callan Yontz/Extender:[name], [name], [name] in Treatment: 3 Visit Information History Since Last Visit Added or deleted any medications: No Patient Arrived: Ambulatory Any new allergies or adverse reactions: No Arrival Time: 07:45 Had a fall or experienced change in No Accompanied By: self activities of daily living that may affect Transfer Assistance: None risk of falls: Patient Identification Verified: Yes Signs or symptoms of abuse/neglect since last No Secondary Verification Process Yes visito Completed: Hospitalized since last visit: No Patient Requires Transmission-Based No Implantable device outside of the clinic excluding No Precautions: cellular tissue based products placed in the center Patient Has Alerts: No since last visit: Pain Present Now: No Electronic Signature(s) Signed: 10/29/2019 5:15:23 PM By: Deon Pilling Entered By: Deon Pilling on 10/29/2019 09:01:10 -------------------------------------------------------------------------------- Encounter Discharge Information Details Patient Name: Date of Service: [name], [name] 10/29/2019 8:00 AM Medical Record [id number] Patient Account Number: [id number] Date of Birth/Sex: Treating RN: 19-Nov-1945 (74 y.o. [name] Primary Care Aydden Cumpian: [name] Other Clinician: Referring Sanjna Haskew: Treating Zavia Pullen/Extender:[name], [name], [name] in Treatment: 3 Encounter Discharge Information Items Discharge Condition: Stable Ambulatory Status: Ambulatory Discharge Destination: Home Transportation: Private  Auto Accompanied By: self Schedule Follow-up Appointment: Yes Clinical Summary of Care: Electronic Signature(s) Signed: 10/29/2019 5:15:23 PM By: Deon Pilling Entered By: Deon Pilling on 10/29/2019 10:33:51 -------------------------------------------------------------------------------- Patient/Caregiver Education Details Patient Name: Date of Service: [name], [name] 3/1/2021andnbsp8:00 AM Medical Record [id number] Patient Account Number: [id number] Date of Birth/Gender: Treating RN: 07/23/1946 (74 y.o. [name] Primary Care Physician: [name] Other Clinician: Referring Physician: Treating Physician/Extender:[name], [name], [name] in Treatment: 3 Education Assessment Education Provided To: Patient Education Topics Provided Hyperbaric Oxygenation: Handouts: Hyperbaric Oxygen Methods: Explain/Verbal Responses: Reinforcements needed Electronic Signature(s) Signed: 10/29/2019 5:15:23 PM By: Deon Pilling Entered By: Deon Pilling on 10/29/2019 10:33:42 -------------------------------------------------------------------------------- Vitals Details Patient Name: Date of Service: [name], [name] 10/29/2019 8:00 AM Medical Record [id number] Patient Account Number: [id number] Date of Birth/Sex: Treating RN: [birth year] (74 y.o. [name], [name] Primary Care Khiley Lieser: [name] Other Clinician: Referring Zalayah Pizzuto: Treating Adalia Pettis/Extender:[name], [name], [name] in Treatment: 3 Vital Signs Time Taken: 07:47 Temperature (F): 98.4 Height (in): 72 Pulse (bpm): 48 Weight (lbs): 240 Respiratory Rate (breaths/min): 18 Body Mass Index (BMI): 32.5 Blood Pressure (mmHg): 137/93 Reference Range: 80 - 120 mg / dl Electronic Signature(s) Signed: 10/29/2019 5:15:23 PM By: Deon Pilling Entered By: Deon Pilling on 10/29/2019 09:01:25

## 2019-10-29 NOTE — Progress Notes (Signed)
EROL, ODAM ([id number]) Visit Report for 10/29/2019 SuperBill Details Patient Name: Date of Service: [name], [name] 10/29/2019 Medical Record [id number] Patient Account Number: [id number] Date of Birth/Sex: Treating RN: 08-Sep-1945 (74 y.o. 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

## 2019-10-30 ENCOUNTER — Encounter (HOSPITAL_BASED_OUTPATIENT_CLINIC_OR_DEPARTMENT_OTHER): Payer: Medicare Other | Admitting: Internal Medicine

## 2019-10-30 DIAGNOSIS — K520 Gastroenteritis and colitis due to radiation: Secondary | ICD-10-CM | POA: Diagnosis not present

## 2019-10-31 ENCOUNTER — Other Ambulatory Visit: Payer: Self-pay

## 2019-10-31 ENCOUNTER — Encounter (HOSPITAL_BASED_OUTPATIENT_CLINIC_OR_DEPARTMENT_OTHER): Payer: Medicare Other | Admitting: Physician Assistant

## 2019-10-31 ENCOUNTER — Ambulatory Visit: Payer: Medicare Other

## 2019-10-31 ENCOUNTER — Ambulatory Visit: Payer: Medicare Other | Attending: Internal Medicine

## 2019-10-31 DIAGNOSIS — K520 Gastroenteritis and colitis due to radiation: Secondary | ICD-10-CM | POA: Diagnosis not present

## 2019-10-31 DIAGNOSIS — Z23 Encounter for immunization: Secondary | ICD-10-CM

## 2019-10-31 NOTE — Progress Notes (Signed)
[name], [name] ([id number]) Visit Report for 10/30/2019 Arrival Information Details Patient Name: Date of Service: [name], [name] 10/30/2019 8:00 AM Medical Record [id number] Patient Account Number: [id number] Date of Birth/Sex: Treating RN: Mar 07, 1946 (74 y.o. [name] Primary Care Jshon Ibe: [name] Other Clinician: Referring Sheli Dorin: Treating Mena Simonis/Extender:[name], [name], [name] in Treatment: 3 Visit Information History Since Last Visit Added or deleted any medications: No Patient Arrived: Ambulatory Any new allergies or adverse reactions: No Arrival Time: 07:50 Had a fall or experienced change in No Accompanied By: alone activities of daily living that may affect Transfer Assistance: None risk of falls: Patient Identification Verified: Yes Signs or symptoms of abuse/neglect since last No Secondary Verification Process Yes visito Completed: Hospitalized since last visit: No Patient Requires Transmission-Based No Implantable device outside of the clinic excluding No Precautions: cellular tissue based products placed in the center Patient Has Alerts: No since last visit: Pain Present Now: No Electronic Signature(s) Signed: 10/31/2019 5:54:02 PM By: Levan Hurst RN, BSN Entered By: Levan Hurst on 10/30/2019 10:21:14 -------------------------------------------------------------------------------- Encounter Discharge Information Details Patient Name: Date of Service: [name], [name] 10/30/2019 8:00 AM Medical Record [id number] Patient Account Number: [id number] Date of Birth/Sex: Treating RN: 14-Oct-1945 (74 y.o. [name] Primary Care Jacilyn Sanpedro: [name] Other Clinician: Referring Quaniyah Bugh: Treating Jacarra Bobak/Extender:[name], [name], [name] in Treatment: 3 Encounter Discharge Information Items Discharge Condition: Stable Ambulatory Status: Ambulatory Discharge Destination: Home Transportation:  Private Auto Accompanied By: alone Schedule Follow-up Appointment: Yes Clinical Summary of Care: Patient Declined Electronic Signature(s) Signed: 10/31/2019 5:54:02 PM By: Levan Hurst RN, BSN Entered By: Levan Hurst on 10/30/2019 10:25:35 -------------------------------------------------------------------------------- [place] Details Patient Name: Date of Service: [name] 10/30/2019 8:00 AM Medical Record [id number] Patient Account Number: [id number] Date of Birth/Sex: Treating RN: 26-Mar-1946 (74 y.o. [name] Primary Care Lavana Huckeba: [name] Other Clinician: Referring Sherrilynn Gudgel: Treating Bertran Zeimet/Extender:[name], [name], [name] in Treatment: 3 Vital Signs Time Taken: 07:54 Temperature (F): 98.2 Height (in): 72 Pulse (bpm): 51 Weight (lbs): 240 Respiratory Rate (breaths/min): 18 Body Mass Index (BMI): 32.5 Blood Pressure (mmHg): 153/73 Reference Range: 80 - 120 mg / dl Electronic Signature(s) Signed: 10/31/2019 5:54:02 PM By: Levan Hurst RN, BSN Entered By: Levan Hurst on 10/30/2019 10:21:37

## 2019-10-31 NOTE — Progress Notes (Signed)
[name], NICK ([id number]) Visit Report for 10/31/2019 Problem List Details Patient Name: Date of Service: DUPRI, WESTERMAN 10/31/2019 8:00 AM Medical Record [id number] Patient Account Number: [id number] Date of Birth/Sex: Treating RN: [birth year] (74 y.o. 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

## 2019-10-31 NOTE — Progress Notes (Signed)
DVANTE, ROGIER ([id number]) Visit Report for 10/30/2019 SuperBill Details Patient Name: Date of Service: DATAVIOUS, KYGER 10/30/2019 Medical Record [id number] Patient Account Number: [id number] Date of Birth/Sex: Treating RN: 14-Apr-1946 (74 y.o. 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

## 2019-10-31 NOTE — Progress Notes (Signed)
CURLEE, ROZZI ([id number]) Visit Report for 10/31/2019 Arrival Information Details Patient Name: Date of Service: XAI, MUSCATO 10/31/2019 8:00 AM Medical Record [id number] Patient Account Number: [id number] Date of Birth/Sex: Treating RN: 1945/12/30 (74 y.o. [name] Primary Care Bekki Tavenner: [name] Other Clinician: [name] Referring Aysiah Jurado: Treating Graham Doukas/Extender:Stone III, [name], [name] in Treatment: 3 Visit Information History Since Last Visit Added or deleted any medications: No Patient Arrived: Ambulatory Any new allergies or adverse reactions: No Arrival Time: 07:55 Had a fall or experienced change in No Accompanied By: self activities of daily living that may affect Transfer Assistance: None risk of falls: Patient Identification Verified: Yes Signs or symptoms of abuse/neglect since last No Secondary Verification Process Yes visito Completed: Hospitalized since last visit: No Patient Requires Transmission-Based No Implantable device outside of the clinic excluding No Precautions: cellular tissue based products placed in the center Patient Has Alerts: No since last visit: Pain Present Now: No Electronic Signature(s) Signed: 10/31/2019 4:03:04 PM By: [name] EMT/HBOT Entered By: [name] on 10/31/2019 08:38:48 -------------------------------------------------------------------------------- Encounter Discharge Information Details Patient Name: Date of Service: ZEPHYR, OSEGUEDA 10/31/2019 8:00 AM Medical Record [id number] Patient Account Number: [id number] Date of Birth/Sex: Treating RN: 27-May-1946 (74 y.o. [name] Primary Care Tarryn Bogdan: [name] Other Clinician: [name] Referring Erykah Lippert: Treating Josemaria Brining/Extender:Stone III, [name], [name] in Treatment: 3 Encounter Discharge Information Items Discharge Condition: Stable Ambulatory Status: Ambulatory Discharge  Destination: Home Transportation: Private Auto Accompanied By: self Schedule Follow-up Appointment: Yes Clinical Summary of Care: Patient Declined Electronic Signature(s) Signed: 10/31/2019 4:03:04 PM By: [name] EMT/HBOT Entered By: [name] on 10/31/2019 11:34:04 -------------------------------------------------------------------------------- Patient/Caregiver Education Details Patient Name: Date of Service: [name], Fermon 3/3/2021andnbsp8:00 AM Medical Record [id number] Patient Account Number: [id number] Date of Birth/Gender: Treating RN: 04/25/1946 (74 y.o. [name] Primary Care Physician: [name] Other Clinician: [name] Referring Physician: Treating Physician/Extender:Stone III, [name], [name] in Treatment: 3 Education Assessment Education Provided To: Patient Education Topics Provided Hyperbaric Oxygenation: Methods: Explain/Verbal Responses: State content correctly Electronic Signature(s) Signed: 10/31/2019 4:03:04 PM By: [name] EMT/HBOT Entered By: [name] on 10/31/2019 11:33:52 -------------------------------------------------------------------------------- Vitals Details Patient Name: Date of Service: [name] 10/31/2019 8:00 AM Medical Record [id number] Patient Account Number: [id number] Date of Birth/Sex: Treating RN: 1946-07-21 (74 y.o. [name] Primary Care Milaina Sher: [name] Other Clinician: [name] Referring Romello Hoehn: Treating Rudransh Bellanca/Extender:Stone III, [name], [name] in Treatment: 3 Vital Signs Time Taken: 08:00 Temperature (F): 98.2 Height (in): 72 Pulse (bpm): 59 Weight (lbs): 240 Respiratory Rate (breaths/min): 16 Body Mass Index (BMI): 32.5 Blood Pressure (mmHg): 127/71 Reference Range: 80 - 120 mg / dl Electronic Signature(s) Signed: 10/31/2019 4:03:04 PM By: [name] EMT/HBOT Entered By: [name] on 10/31/2019 08:39:04

## 2019-10-31 NOTE — Progress Notes (Addendum)
MAZEN, PHO ([id number]) Visit Report for 10/31/2019 HBO Details Patient Name: Date of Service: [name], [name] 10/31/2019 8:00 AM Medical Record [id number] Patient Account Number: [id number] Date of Birth/Sex: Treating RN: 1946/03/22 (74 y.o. [name] Primary Care Diana Armijo: [name] Other Clinician: [name] Referring Birtha Hatler: Treating Viren Lebeau/Extender:Stone III, [name], [name] in Treatment: 3 HBO Treatment Course Details Treatment Course Number: 1 Ordering Trinidad Ingle: [name] Total Treatments Ordered: 40 HBO Treatment Start Date: 10/15/2019 HBO Indication: Late Effect of Radiation HBO Treatment Details Treatment Number: 12 Patient Type: Outpatient Chamber Type: Monoplace Chamber Serial #: [id number] Treatment Protocol: 2.5 ATA with 90 minutes oxygen, with two 5 minute air breaks Treatment Details Compression Rate Down: 2.0 psi / minute De-Compression Rate Up: 2.0 psi / minute Air breaks and CompressTx Pressure breathing periods DecompressDecompress Begins Reached (leave unused spaces Begins Ends blank) Chamber Pressure (ATA)1 2.5 2.5 2.5 2.5 2.5 --2.5 1 Clock Time (24 hr) 08:10 08:22 [id number] 10:14 Treatment Length: 124 (minutes) Treatment Segments: 4 Vital Signs Capillary Blood Glucose Reference Range: 80 - 120 mg / dl HBO Diabetic Blood Glucose Intervention Range: <131 mg/dl or >249 mg/dl Time Vitals Blood Respiratory Capillary Blood Glucose Pulse Action Type: Pulse: Temperature: Taken: Pressure: Rate: Glucose (mg/dl): Meter #: Oximetry (%) Taken: Pre 08:00 127/71 59 16 98.2 Post 10:17 149/71 53 14 98 Treatment Response Treatment Toleration: Well Treatment Completion Treatment Completed without Adverse Event Status: Physician HBO Attestation: I certify that I supervised this HBO treatment in accordance with Medicare guidelines. A trained Yes emergency response team is readily available per hospital  policies and procedures. Continue HBOT as ordered. Yes Electronic Signature(s) Signed: 10/31/2019 4:18:42 PM By: Worthy Keeler PA-C Previous Signature: 10/31/2019 4:03:04 PM Version By: [name] EMT/HBOT Entered By: Worthy Keeler on 10/31/2019 16:18:41 -------------------------------------------------------------------------------- HBO Safety Checklist Details Patient Name: Date of Service: [name], [name] 10/31/2019 8:00 AM Medical Record [id number] Patient Account Number: [id number] Date of Birth/Sex: Treating RN: 10/15/1945 (74 y.o. [name] Primary Care Tibor Lemmons: [name] Other Clinician: [name] Referring Anessa Charley: Treating Kevin Space/Extender:Stone III, [name], [name] in Treatment: 3 HBO Safety Checklist Items Safety Checklist Consent Form Signed Patient voided / foley secured and emptied When did you last eato n/a Last dose of injectable or oral agent n/a NA Ostomy pouch emptied and vented if applicable NA All implantable devices assessed, documented and approved NA Intravenous access site secured and place Valuables secured Linens and cotton and cotton/polyester blend (less than 51% polyester) Personal oil-based products / skin lotions / body lotions removed NA Wigs or hairpieces removed NA Smoking or tobacco materials removed Books / newspapers / magazines / loose paper removed Cologne, aftershave, perfume and deodorant removed Jewelry removed (may wrap wedding band) NA Make-up removed Hair care products removed NA Battery operated devices (external) removed NA Heating patches and chemical warmers removed NA Titanium eyewear removed NA Nail polish cured greater than 10 hours NA Casting material cured greater than 10 hours NA Hearing aids removed NA Loose dentures or partials removed NA Prosthetics have been removed Patient demonstrates correct use of air break device (if applicable) Patient concerns have been  addressed Patient grounding bracelet on and cord attached to chamber Specifics for Inpatients (complete in addition to above) Medication sheet sent with patient Intravenous medications needed or due during therapy sent with patient Drainage tubes (e.g. nasogastric tube or chest tube secured and vented) Endotracheal or Tracheotomy tube secured Cuff deflated of air and inflated with saline Airway suctioned Electronic Signature(s)  Signed: 10/31/2019 8:39:45 AM By: [name] EMT/HBOT Entered By: [name] on 10/31/2019 08:39:45

## 2019-10-31 NOTE — Progress Notes (Signed)
SANTE, NAGLE ([id number]) Visit Report for 10/30/2019 HBO Details Patient Name: Date of Service: [name], [name] 10/30/2019 8:00 AM Medical Record [id number] Patient Account Number: [id number] Date of Birth/Sex: Treating RN: May 14, 1946 (74 y.o. [name] Primary Care [name]: [name] Other Clinician: Referring [name]: Treating Krystian Ferrentino/Extender:[name], [name], [name] in Treatment: 3 HBO Treatment Course Details Treatment Course Number: 1 Ordering Avarie Tavano: [name] Total Treatments Ordered: 40 HBO Treatment Start Date: 10/15/2019 HBO Indication: Late Effect of Radiation HBO Treatment Details Treatment Number: 11 Patient Type: Outpatient Chamber Type: Monoplace Chamber Serial #: [id number] Treatment Protocol: 2.5 ATA with 90 minutes oxygen, with two 5 minute air breaks Treatment Details Compression Rate Down: 2.0 psi / minute De-Compression Rate Up: 2.0 psi / minute Air breaks and CompressTx Pressure breathing periods DecompressDecompress Begins Reached (leave unused spaces Begins Ends blank) Chamber Pressure (ATA)1 2.5 2.5 2.5 2.5 2.5 --2.5 1 Clock Time (24 hr) 07:58 08:09 08:3908:4409:1509:20--09:50 10:02 Treatment Length: 124 (minutes) Treatment Segments: 4 Vital Signs Capillary Blood Glucose Reference Range: 80 - 120 mg / dl HBO Diabetic Blood Glucose Intervention Range: <131 mg/dl or >249 mg/dl Time Vitals Blood Respiratory Capillary Blood Glucose Pulse Action Type: Pulse: Temperature: Taken: Pressure: Rate: Glucose (mg/dl): Meter #: Oximetry (%) Taken: Pre 07:54 153/73 51 18 98.2 Post 10:05 154/73 45 16 98 Treatment Response Treatment Completion Status: Treatment Completed without Adverse Event Acadia Thammavong Notes No concerns with treatment given Physician HBO Attestation: I certify that I supervised this HBO treatment in accordance with Medicare guidelines. A trained Yes emergency response team is readily available  per hospital policies and procedures. Continue HBOT as ordered. Yes Electronic Signature(s) Signed: 10/30/2019 5:14:23 PM By: [name] MD Entered By: [name] on 10/30/2019 17:11:22 -------------------------------------------------------------------------------- HBO Safety Checklist Details Patient Name: Date of Service: [name], [name] 10/30/2019 8:00 AM Medical Record [id number] Patient Account Number: [id number] Date of Birth/Sex: Treating RN: 10/03/1945 (74 y.o. [name] Primary Care Jalan Bodi: [name] Other Clinician: Referring Janie Strothman: Treating Cage Gupton/Extender:[name], [name], [name] in Treatment: 3 HBO Safety Checklist Items Safety Checklist Consent Form Signed Patient voided / foley secured and emptied When did you last eato 0700 Last dose of injectable or oral agent NA NA Ostomy pouch emptied and vented if applicable NA All implantable devices assessed, documented and approved NA Intravenous access site secured and place Valuables secured Linens and cotton and cotton/polyester blend (less than 51% polyester) Personal oil-based products / skin lotions / body lotions removed Wigs or hairpieces removed Smoking or tobacco materials removed Books / newspapers / magazines / loose paper removed Cologne, aftershave, perfume and deodorant removed Jewelry removed (may wrap wedding band) NA Make-up removed Hair care products removed Battery operated devices (external) removed NA Heating patches and chemical warmers removed NA Titanium eyewear removed NA Nail polish cured greater than 10 hours NA Casting material cured greater than 10 hours NA Hearing aids removed NA Loose dentures or partials removed NA Prosthetics have been removed Patient demonstrates correct use of air break device (if applicable) Patient concerns have been addressed Patient grounding bracelet on and cord attached to chamber Specifics for Inpatients  (complete in addition to above) NA Medication sheet sent with patient NA Intravenous medications needed or due during therapy sent with patient NA Drainage tubes (e.g. nasogastric tube or chest tube secured and vented) NA Endotracheal or Tracheotomy tube secured NA Cuff deflated of air and inflated with saline NA Airway suctioned Electronic Signature(s) Signed: 10/31/2019 5:54:02 PM By: Levan Hurst RN, BSN Entered By:  Levan Hurst on 10/30/2019 10:22:20

## 2019-10-31 NOTE — Progress Notes (Signed)
   Covid-19 Vaccination Clinic  Name:  [name]    MRN: [id number] DOB: Jul 16, 1946  10/31/2019  [name] was observed post Covid-19 immunization for 15 minutes without incident. He was provided with Vaccine Information Sheet and instruction to access the V-Safe system.   [name] was instructed to call 911 with any severe reactions post vaccine: Marland Kitchen Difficulty breathing  . Swelling of face and throat  . A fast heartbeat  . A bad rash all over body  . Dizziness and weakness   Immunizations Administered    Name Date Dose VIS Date Route   Pfizer COVID-19 Vaccine 10/31/2019  1:49 PM 0.3 mL 08/10/2019 Intramuscular   Manufacturer: [place]   Lot: [id number]   [place]: [id number]

## 2019-11-01 ENCOUNTER — Encounter (HOSPITAL_BASED_OUTPATIENT_CLINIC_OR_DEPARTMENT_OTHER): Payer: Medicare Other | Admitting: Internal Medicine

## 2019-11-01 DIAGNOSIS — K520 Gastroenteritis and colitis due to radiation: Secondary | ICD-10-CM | POA: Diagnosis not present

## 2019-11-01 NOTE — Progress Notes (Addendum)
LUAL, [name] Visit Report for 11/01/2019 HBO Details Patient Name: Date of Service: [name], [name] 11/01/2019 8:00 AM Medical Record [id number] Patient Account Number: [id number] Date of Birth/Sex: Treating RN: [birth year] (74 y.o. [name], [name] Primary Care Audie Wieser: [name] Other Clinician: [name] Referring Gorge Almanza: Treating Derika Eckles/Extender:[name], [name], [name] in Treatment: 3 HBO Treatment Course Details Treatment Course Number: 1 Ordering Sabrine Patchen: [name] Total Treatments Ordered: 40 HBO Treatment Start Date: 10/15/2019 HBO Indication: Late Effect of Radiation HBO Treatment Details Treatment Number: 13 Patient Type: Outpatient Chamber Type: Monoplace Chamber Serial #: [id number] Treatment Protocol: 2.5 ATA with 90 minutes oxygen, with two 5 minute air breaks Treatment Details Compression Rate Down: 2.0 psi / minute De-Compression Rate Up: 2.0 psi / minute Air breaks and CompressTx Pressure breathing periods DecompressDecompress Begins Reached (leave unused spaces Begins Ends blank) Chamber Pressure (ATA)1 2.5 2.5 2.5 2.5 2.5 --2.5 1 Clock Time (24 hr) 08:09 08:21 [id number] 10:13 Treatment Length: 124 (minutes) Treatment Segments: 4 Vital Signs Capillary Blood Glucose Reference Range: 80 - 120 mg / dl HBO Diabetic Blood Glucose Intervention Range: <131 mg/dl or >249 mg/dl Time Vitals Blood Respiratory Capillary Blood Glucose Pulse Action Type: Pulse: Temperature: Taken: Pressure: Rate: Glucose (mg/dl): Meter #: Oximetry (%) Taken: Pre 08:05 127/58 51 16 97.8 Post 10:15 147/78 53 15 98.4 Treatment Response Treatment Toleration: Well Treatment Completion Treatment Completed without Adverse Event Status: Ulla Mckiernan Notes No concerns concerns with treatment given Physician HBO Attestation: I certify that I supervised this HBO treatment in accordance with Medicare guidelines. A  trained Yes Yes emergency response team is readily available per hospital policies and procedures. Continue HBOT as ordered. Yes Electronic Signature(s) Signed: 11/01/2019 5:41:03 PM By: [name] MD Previous Signature: 11/01/2019 3:59:23 PM Version By: [name] EMT/HBOT Entered By: [name] on 11/01/2019 17:39:39 -------------------------------------------------------------------------------- HBO Safety Checklist Details Patient Name: Date of Service: [name], [name] 11/01/2019 8:00 AM Medical Record [id number] Patient Account Number: [id number] Date of Birth/Sex: Treating RN: 12-14-1945 (74 y.o. [name], [name] Primary Care Cana Mignano: [name] Other Clinician: [name] Referring Ahlayah Tarkowski: Treating Avital Dancy/Extender:[name], [name], [name] in Treatment: 3 HBO Safety Checklist Items Safety Checklist Consent Form Signed Patient voided / foley secured and emptied When did you last eato n/a Last dose of injectable or oral agent n/a NA Ostomy pouch emptied and vented if applicable NA All implantable devices assessed, documented and approved NA Intravenous access site secured and place Valuables secured Linens and cotton and cotton/polyester blend (less than 51% polyester) Personal oil-based products / skin lotions / body lotions removed NA Wigs or hairpieces removed NA Smoking or tobacco materials removed Books / newspapers / magazines / loose paper removed Cologne, aftershave, perfume and deodorant removed Jewelry removed (may wrap wedding band) NA Make-up removed Hair care products removed NA Battery operated devices (external) removed NA Heating patches and chemical warmers removed NA Titanium eyewear removed NA Nail polish cured greater than 10 hours NA Casting material cured greater than 10 hours NA Hearing aids removed NA Loose dentures or partials removed NA Prosthetics have been removed Patient demonstrates correct use of  air break device (if applicable) Patient concerns have been addressed Patient grounding bracelet on and cord attached to chamber Specifics for Inpatients (complete in addition to above) Medication sheet sent with patient Intravenous medications needed or due during therapy sent with patient Drainage tubes (e.g. nasogastric tube or chest tube secured and vented) Endotracheal or Tracheotomy tube secured Cuff deflated of air and inflated with  saline Airway suctioned Electronic Signature(s) Signed: 11/01/2019 8:14:22 AM By: [name] EMT/HBOT Entered By: [name] on 11/01/2019 08:14:21

## 2019-11-01 NOTE — Progress Notes (Signed)
SIDI, COTTRILL ([id number]) Visit Report for 11/01/2019 SuperBill Details Patient Name: Date of Service: [name], [name] 11/01/2019 Medical Record [id number] Patient Account Number: [id number] Date of Birth/Sex: Treating RN: 05/09/1946 (74 y.o. 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

## 2019-11-01 NOTE — Progress Notes (Signed)
[name], [name] ([id number]) Visit Report for 11/01/2019 Arrival Information Details Patient Name: Date of Service: [name], [name] 11/01/2019 8:00 AM Medical Record [id number] Patient Account Number: [id number] Date of Birth/Sex: Treating RN: June 06, 1946 (74 y.o. [name] [name], [name] [name] Primary Care Lynzie Cliburn: [name] Other Clinician: [name] Referring Haleem Hanner: Treating Arvine Clayburn/Extender:[name], [name], [name] in Treatment: 3 Visit Information History Since Last Visit Added or deleted any medications: No Patient Arrived: Ambulatory Any new allergies or adverse reactions: No Arrival Time: 08:00 Had a fall or experienced change in No Accompanied By: self activities of daily living that may affect Transfer Assistance: None risk of falls: Patient Identification Verified: Yes Signs or symptoms of abuse/neglect since last No Secondary Verification Process Yes visito Completed: Hospitalized since last visit: No Patient Requires Transmission-Based No Implantable device outside of the clinic excluding No Precautions: cellular tissue based products placed in the center Patient Has Alerts: No since last visit: Pain Present Now: No Electronic Signature(s) Signed: 11/01/2019 3:59:23 PM By: [name] EMT/HBOT Entered By: [name] on 11/01/2019 08:13:26 -------------------------------------------------------------------------------- Encounter Discharge Information Details Patient Name: Date of Service: [name], [name] 11/01/2019 8:00 AM Medical Record [id number] Patient Account Number: [id number] Date of Birth/Sex: Treating RN: September 20, 1945 (74 y.o. [name] [name] Primary Care Maddilyn Campus: [name] Other Clinician: [name] Referring Jossalin Chervenak: Treating Naydeen Speirs/Extender:[name], [name], [name] in Treatment: 3 Encounter Discharge Information Items Discharge Condition: Stable Ambulatory Status: Ambulatory Discharge Destination:  Home Transportation: Private Auto Accompanied By: self Schedule Follow-up Appointment: Yes Clinical Summary of Care: Patient Declined Electronic Signature(s) Signed: 11/01/2019 3:59:23 PM By: [name] EMT/HBOT Entered By: [name] on 11/01/2019 10:36:11 -------------------------------------------------------------------------------- Patient/Caregiver Education Details Patient Name: Date of Service: [name], [name] 3/4/2021andnbsp8:00 AM Medical Record [id number] Patient Account Number: [id number] Date of Birth/Gender: Treating RN: 12/28/1945 (73 y.o. [name] [name] Primary Care Physician: [name] Other Clinician: [name] Referring Physician: Treating Physician/Extender:[name], [name], [name] in Treatment: 3 Education Assessment Education Provided To: Patient Education Topics Provided Hyperbaric Oxygenation: Methods: Explain/Verbal Responses: State content correctly Electronic Signature(s) Signed: 11/01/2019 3:59:23 PM By: [name] EMT/HBOT Entered By: [name] on 11/01/2019 10:35:57 -------------------------------------------------------------------------------- Vitals Details Patient Name: Date of Service: [name], [name] 11/01/2019 8:00 AM Medical Record [id number] Patient Account Number: [id number] Date of Birth/Sex: Treating RN: 10/10/1945 (74 y.o. [name] [name], [name] [name] Primary Care Symeon Puleo: [name] Other Clinician: [name] Referring Daviyon Widmayer: Treating Alinda Egolf/Extender:[name], [name], [name] in Treatment: 3 Vital Signs Time Taken: 08:05 Temperature (F): 97.8 Height (in): 72 Pulse (bpm): 51 Weight (lbs): 240 Respiratory Rate (breaths/min): 16 Body Mass Index (BMI): 32.5 Blood Pressure (mmHg): 127/58 Reference Range: 80 - 120 mg / dl Electronic Signature(s) Signed: 11/01/2019 3:59:23 PM By: [name] EMT/HBOT Entered By: [name] on 11/01/2019 08:13:43

## 2019-11-02 ENCOUNTER — Other Ambulatory Visit: Payer: Self-pay

## 2019-11-02 ENCOUNTER — Encounter (HOSPITAL_BASED_OUTPATIENT_CLINIC_OR_DEPARTMENT_OTHER): Payer: Medicare Other | Admitting: Internal Medicine

## 2019-11-02 DIAGNOSIS — K520 Gastroenteritis and colitis due to radiation: Secondary | ICD-10-CM | POA: Diagnosis not present

## 2019-11-02 NOTE — Progress Notes (Signed)
[name] ([id number]) Visit Report for 11/02/2019 Arrival Information Details Patient Name: Date of Service: [name], [name] 11/02/2019 8:00 AM Medical Record [id number] Patient Account Number: [id number] Date of Birth/Sex: Treating RN: [birth year] (74 y.o. [name] Primary Care Rondell Pardon: [name] Other Clinician: [name] Referring Romayne Ticas: Treating Anntionette Madkins/Extender:[name], [name], [name] in Treatment: 4 Visit Information History Since Last Visit Added or deleted any medications: No Patient Arrived: Ambulatory Any new allergies or adverse reactions: No Arrival Time: 07:45 Had a fall or experienced change in No Accompanied By: self activities of daily living that may affect Transfer Assistance: None risk of falls: Patient Identification Verified: Yes Signs or symptoms of abuse/neglect since last No Secondary Verification Process Yes visito Completed: Hospitalized since last visit: No Patient Requires Transmission-Based No Implantable device outside of the clinic excluding No Precautions: cellular tissue based products placed in the center Patient Has Alerts: No since last visit: Pain Present Now: No Electronic Signature(s) Signed: 11/02/2019 4:01:29 PM By: [name] EMT/HBOT Entered By: [name] on 11/02/2019 08:04:17 -------------------------------------------------------------------------------- Encounter Discharge Information Details Patient Name: Date of Service: [name] 11/02/2019 8:00 AM Medical Record [id number] Patient Account Number: [id number] Date of Birth/Sex: Treating RN: May 01, 1946 (74 y.o. [name] Primary Care Suzi Hernan: [name] Other Clinician: [name] Referring Sharine Cadle: Treating Arionna Hoggard/Extender:[name], [name], [name] in Treatment: 4 Encounter Discharge Information Items Discharge Condition: Stable Ambulatory Status: Ambulatory Discharge  Destination: Home Transportation: Private Auto Accompanied By: self Schedule Follow-up Appointment: Yes Clinical Summary of Care: Patient Declined Electronic Signature(s) Signed: 11/02/2019 4:01:29 PM By: [name] EMT/HBOT Entered By: [name] on 11/02/2019 10:57:02 -------------------------------------------------------------------------------- Patient/Caregiver Education Details Patient Name: Date of Service: [name] 3/5/2021andnbsp8:00 AM Medical Record [id number] Patient Account Number: [id number] Date of Birth/Gender: Treating RN: 01-28-1946 (73 y.o. [name] Primary Care Physician: [name] Other Clinician: [name] Referring Physician: Treating Physician/Extender:[name], [name], [name] in Treatment: 4 Education Assessment Education Provided To: Patient Education Topics Provided Hyperbaric Oxygenation: Methods: Explain/Verbal Responses: State content correctly Electronic Signature(s) Signed: 11/02/2019 4:01:29 PM By: [name] EMT/HBOT Entered By: [name] on 11/02/2019 10:56:44 -------------------------------------------------------------------------------- Vitals Details Patient Name: Date of Service: [name] 11/02/2019 8:00 AM Medical Record [id number] Patient Account Number: [id number] Date of Birth/Sex: Treating RN: 1945-10-13 (74 y.o. [name] Primary Care Wilder Kurowski: [name] Other Clinician: [name] Referring Gwenivere Hiraldo: Treating Harvard Zeiss/Extender:[name], [name], [name] in Treatment: 4 Vital Signs Time Taken: 07:50 Temperature (F): 98.6 Height (in): 72 Pulse (bpm): 57 Weight (lbs): 240 Respiratory Rate (breaths/min): 16 Body Mass Index (BMI): 32.5 Blood Pressure (mmHg): 134/67 Reference Range: 80 - 120 mg / dl Electronic Signature(s) Signed: 11/02/2019 4:01:29 PM By: [name] EMT/HBOT Entered By: [name] on 11/02/2019  08:04:34

## 2019-11-02 NOTE — Progress Notes (Signed)
RONDIE, PARTEE ([id number]) Visit Report for 11/02/2019 SuperBill Details Patient Name: Date of Service: RYSHAWN, WIEGAND 11/02/2019 Medical Record [id number] Patient Account Number: [id number] Date of Birth/Sex: Treating RN: 1946-05-31 (74 y.o. 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

## 2019-11-02 NOTE — Progress Notes (Addendum)
[name], [name] ([id number]) Visit Report for 11/02/2019 HBO Details Patient Name: Date of Service: [name], [name] 11/02/2019 8:00 AM Medical Record [id number] Patient Account Number: [id number] Date of Birth/Sex: Treating RN: 1946/02/04 (74 y.o. [name] Primary Care Rayshad Riviello: [name] Other Clinician: [name] Referring Erandi Lemma: Treating Latiesha Harada/Extender:[name], [name], [name] in Treatment: 4 HBO Treatment Course Details Treatment Course Number: 1 Ordering Laraine Samet: [name] Total Treatments Ordered: 40 HBO Treatment Start Date: 10/15/2019 HBO Indication: Late Effect of Radiation HBO Treatment Details Treatment Number: 14 Patient Type: Outpatient Chamber Type: Monoplace Chamber Serial #: [id number] Treatment Protocol: 2.5 ATA with 90 minutes oxygen, with two 5 minute air breaks Treatment Details Compression Rate Down: 2.0 psi / minute De-Compression Rate Up: 2.0 psi / minute Air breaks and CompressTx Pressure breathing periods DecompressDecompress Begins Reached (leave unused spaces Begins Ends blank) Chamber Pressure (ATA)1 2.5 2.5 2.5 2.5 2.5 --2.5 1 Clock Time (24 hr) 07:59 08:11 [id number] 10:03 Treatment Length: 124 (minutes) Treatment Segments: 4 Vital Signs Capillary Blood Glucose Reference Range: 80 - 120 mg / dl HBO Diabetic Blood Glucose Intervention Range: <131 mg/dl or >249 mg/dl Time Vitals Blood Respiratory Capillary Blood Glucose Pulse Action Type: Pulse: Temperature: Taken: Pressure: Rate: Glucose (mg/dl): Meter #: Oximetry (%) Taken: Pre 07:50 134/67 57 16 98.6 Post 10:05 146/68 53 14 98.2 Treatment Response Treatment Toleration: Well Treatment Completion Treatment Completed without Adverse Event Status: Teyah Rossy Notes No concerns with treatment given Physician HBO Attestation: I certify that I supervised this HBO treatment in accordance with Medicare guidelines. A  trained Yes Yes emergency response team is readily available per hospital policies and procedures. Continue HBOT as ordered. Yes Electronic Signature(s) Signed: 11/02/2019 5:21:33 PM By: [name] MD Previous Signature: 11/02/2019 4:01:29 PM Version By: [name] EMT/HBOT Entered By: [name] on 11/02/2019 17:19:31 -------------------------------------------------------------------------------- HBO Safety Checklist Details Patient Name: Date of Service: [name], [name] 11/02/2019 8:00 AM Medical Record [id number] Patient Account Number: [id number] Date of Birth/Sex: Treating RN: [birth year] (74 y.o. [name] Primary Care Jaila Schellhorn: [name] Other Clinician: [name] Referring Satonya Lux: Treating Arelyn Gauer/Extender:[name], [name], [name] in Treatment: 4 HBO Safety Checklist Items Safety Checklist Consent Form Signed Patient voided / foley secured and emptied When did you last eato n/a Last dose of injectable or oral agent n/a NA Ostomy pouch emptied and vented if applicable NA All implantable devices assessed, documented and approved NA Intravenous access site secured and place Valuables secured Linens and cotton and cotton/polyester blend (less than 51% polyester) Personal oil-based products / skin lotions / body lotions removed NA Wigs or hairpieces removed NA Smoking or tobacco materials removed Books / newspapers / magazines / loose paper removed Cologne, aftershave, perfume and deodorant removed Jewelry removed (may wrap wedding band) NA Make-up removed Hair care products removed NA Battery operated devices (external) removed NA Heating patches and chemical warmers removed NA Titanium eyewear removed NA Nail polish cured greater than 10 hours NA Casting material cured greater than 10 hours NA Hearing aids removed NA Loose dentures or partials removed NA Prosthetics have been removed Patient demonstrates correct  use of air break device (if applicable) Patient concerns have been addressed Patient grounding bracelet on and cord attached to chamber Specifics for Inpatients (complete in addition to above) Medication sheet sent with patient Intravenous medications needed or due during therapy sent with patient Drainage tubes (e.g. nasogastric tube or chest tube secured and vented) Endotracheal or Tracheotomy tube secured Cuff deflated of air and inflated with saline  Airway suctioned Electronic Signature(s) Signed: 11/02/2019 8:05:10 AM By: [name] EMT/HBOT Entered By: [name] on 11/02/2019 08:05:09

## 2019-11-05 ENCOUNTER — Encounter (HOSPITAL_BASED_OUTPATIENT_CLINIC_OR_DEPARTMENT_OTHER): Payer: Medicare Other | Admitting: Internal Medicine

## 2019-11-05 ENCOUNTER — Other Ambulatory Visit: Payer: Self-pay

## 2019-11-05 DIAGNOSIS — K520 Gastroenteritis and colitis due to radiation: Secondary | ICD-10-CM | POA: Diagnosis not present

## 2019-11-05 NOTE — Progress Notes (Signed)
[name] ([id number]) Visit Report for 11/05/2019 Arrival Information Details Patient Name: Date of Service: [name], [name] 11/05/2019 8:00 AM Medical Record [id number] Patient Account Number: [id number] Date of Birth/Sex: Treating RN: 1946-07-25 (74 y.o. [name], [name] Primary Care Alassane Kalafut: [name] Other Clinician: [name] Referring Lekendrick Alpern: Treating Belia Febo/Extender:[name], [name], [name] in Treatment: 4 Visit Information History Since Last Visit Added or deleted any medications: No Patient Arrived: Ambulatory Any new allergies or adverse reactions: No Arrival Time: 07:50 Had a fall or experienced change in No Accompanied By: self activities of daily living that may affect Transfer Assistance: None risk of falls: Patient Identification Verified: Yes Signs or symptoms of abuse/neglect since last No Secondary Verification Process Yes visito Completed: Hospitalized since last visit: No Patient Requires Transmission-Based No Implantable device outside of the clinic excluding No Precautions: cellular tissue based products placed in the center Patient Has Alerts: No since last visit: Pain Present Now: No Electronic Signature(s) Signed: 11/05/2019 4:54:41 PM By: [name] EMT/HBOT Entered By: [name] on 11/05/2019 09:12:06 -------------------------------------------------------------------------------- Encounter Discharge Information Details Patient Name: Date of Service: [name], [name] 11/05/2019 8:00 AM Medical Record [id number] Patient Account Number: [id number] Date of Birth/Sex: Treating RN: Apr 20, 1946 (74 y.o. [name] Primary Care Hayslee Casebolt: [name] Other Clinician: [name] Referring Aireana Ryland: Treating Azariel Banik/Extender:[name], [name], [name] in Treatment: 4 Encounter Discharge Information Items Discharge Condition: Stable Ambulatory Status: Ambulatory Discharge  Destination: Home Transportation: Private Auto Accompanied By: self Schedule Follow-up Appointment: Yes Clinical Summary of Care: Patient Declined Electronic Signature(s) Signed: 11/05/2019 4:54:41 PM By: [name] EMT/HBOT Entered By: [name] on 11/05/2019 10:14:24 -------------------------------------------------------------------------------- Patient/Caregiver Education Details Patient Name: Date of Service: [name], [name] 3/8/2021andnbsp8:00 AM Medical Record [id number] Patient Account Number: [id number] Date of Birth/Gender: Treating RN: 1946/03/02 (73 y.o. [name] Primary Care Physician: [name] Other Clinician: [name] Referring Physician: Treating Physician/Extender:[name], [name], [name] in Treatment: 4 Education Assessment Education Provided To: Patient Education Topics Provided Hyperbaric Oxygenation: Methods: Explain/Verbal Responses: State content correctly Electronic Signature(s) Signed: 11/05/2019 4:54:41 PM By: [name] EMT/HBOT Entered By: [name] on 11/05/2019 10:14:08 -------------------------------------------------------------------------------- Vitals Details Patient Name: Date of Service: [name], [name] 11/05/2019 8:00 AM Medical Record [id number] Patient Account Number: [id number] Date of Birth/Sex: Treating RN: September 16, 1945 (74 y.o. [name] Primary Care Nikie Cid: [name] Other Clinician: [name] Referring Danahi Reddish: Treating Tasia Liz/Extender:[name], [name], [name] in Treatment: 4 Vital Signs Time Taken: 07:55 Temperature (F): 98.2 Height (in): 72 Pulse (bpm): 50 Weight (lbs): 240 Respiratory Rate (breaths/min): 16 Body Mass Index (BMI): 32.5 Blood Pressure (mmHg): 124/68 Reference Range: 80 - 120 mg / dl Electronic Signature(s) Signed: 11/05/2019 4:54:41 PM By: [name] EMT/HBOT Entered By: [name] on 11/05/2019 09:12:30

## 2019-11-05 NOTE — Progress Notes (Addendum)
[name] ([id number]) Visit Report for 11/05/2019 HBO Details Patient Name: Date of Service: [name], [name] 11/05/2019 8:00 AM Medical Record [id number] Patient Account Number: [id number] Date of Birth/Sex: Treating RN: October 03, 1945 (74 y.o. [name] Primary Care Cassidy Tabet: [name] Other Clinician: [name] Referring Lattie Cervi: Treating Overton Boggus/Extender:[name], [name], [name] in Treatment: 4 HBO Treatment Course Details Treatment Course Number: 1 Ordering Srihari Shellhammer: [name] Total Treatments Ordered: 40 HBO Treatment Start Date: 10/15/2019 HBO Indication: Late Effect of Radiation HBO Treatment Details Treatment Number: 15 Patient Type: Outpatient Chamber Type: Monoplace Chamber Serial #: [id number] Treatment Protocol: 2.5 ATA with 90 minutes oxygen, with two 5 minute air breaks Treatment Details Compression Rate Down: 2.0 psi / minute De-Compression Rate Up: 2.0 psi / minute Air breaks CompressTx Pressure and breathing DecompressDecompress periods Begins Reached (leave unused Begins Ends spaces blank) Chamber Pressure (ATA)1 2.5 2.52.52.52.5--2.5 1 Clock Time (24 hr) 08:05 - - - - - --08:15 08:23 Treatment Length: 18 (minutes) Treatment Segments: 1 Vital Signs Capillary Blood Glucose Reference Range: 80 - 120 mg / dl HBO Diabetic Blood Glucose Intervention Range: <131 mg/dl or >249 mg/dl Time Vitals Blood Respiratory Capillary Blood Glucose Pulse Action Type: Pulse: Temperature: Taken: Pressure: Rate: Glucose (mg/dl): Meter #: Oximetry (%) Taken: Pre 07:55 124/68 50 16 98.2 Post 08:25 139/78 52 15 97.8 Treatment Response Treatment Toleration: Poor Adverse Events: 1:Barotrauma - Ear Treatment Completion Treatment Aborted/Not Restarted Status: Reason: Adverse Event Treatment Notes Pt compressed in chamber to 10 psi (44fsw) and began experiencing severe inner ear pain. Upon assessment, HBOT decompressed chamber and  notified Jeshawn Melucci. Kalenna Millett Notes Patient was brought up early from treatment complaining of right ear pain. He said he could not equilibrate the pressure. Exam revealed occlusive cerumen in the right ear I could not see his drum. I told him that we would try Afrin tomorrow but if he cannot tolerate this he is likely to need to see ENT Physician HBO Attestation: I certify that I supervised this HBO treatment in accordance with Medicare guidelines. A trained Yes emergency response team is readily available per hospital policies and procedures. Continue HBOT as ordered. Yes Electronic Signature(s) Signed: 11/05/2019 5:29:15 PM By: [name] MD Previous Signature: 11/05/2019 10:10:51 AM Version By: [name] EMT/HBOT Entered By: [name] on 11/05/2019 17:25:41 -------------------------------------------------------------------------------- HBO Safety Checklist Details Patient Name: Date of Service: [name] 11/05/2019 8:00 AM Medical Record [id number] Patient Account Number: [id number] Date of Birth/Sex: Treating RN: 04/20/1946 (74 y.o. [name], [name] Primary Care Cristino Degroff: [name] Other Clinician: [name] Referring Tanyah Debruyne: Treating Darriel Sinquefield/Extender:[name], [name], [name] in Treatment: 4 HBO Safety Checklist Items Safety Checklist Consent Form Signed Patient voided / foley secured and emptied When did you last eato n/a Last dose of injectable or oral agent n/a NA Ostomy pouch emptied and vented if applicable NA All implantable devices assessed, documented and approved NA Intravenous access site secured and place Valuables secured Linens and cotton and cotton/polyester blend (less than 51% polyester) Personal oil-based products / skin lotions / body lotions removed NA Wigs or hairpieces removed NA Smoking or tobacco materials removed Books / newspapers / magazines / loose paper removed Cologne, aftershave, perfume and  deodorant removed Jewelry removed (may wrap wedding band) NA Make-up removed Hair care products removed NA Battery operated devices (external) removed NA Heating patches and chemical warmers removed NA Titanium eyewear removed Nail polish cured greater than 10 hours NA Casting material cured greater than 10 hours NA Hearing aids removed NA  Loose dentures or partials removed NA Prosthetics have been removed NA Patient demonstrates correct use of air break device (if applicable) Patient concerns have been addressed Patient grounding bracelet on and cord attached to chamber Specifics for Inpatients (complete in addition to above) Medication sheet sent with patient Intravenous medications needed or due during therapy sent with patient Drainage tubes (e.g. nasogastric tube or chest tube secured and vented) Endotracheal or Tracheotomy tube secured Cuff deflated of air and inflated with saline Airway suctioned Electronic Signature(s) Signed: 11/05/2019 9:13:06 AM By: [name] EMT/HBOT Entered By: [name] on 11/05/2019 09:13:05

## 2019-11-05 NOTE — Progress Notes (Signed)
IBHAN, MARKUNAS ([id number]) Visit Report for 11/05/2019 SuperBill Details Patient Name: Date of Service: KEELEY, SILVAN 11/05/2019 Medical Record [id number] Patient Account Number: [id number] Date of Birth/Sex: Treating RN: 08/09/1946 (74 y.o. 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

## 2019-11-06 ENCOUNTER — Encounter (HOSPITAL_BASED_OUTPATIENT_CLINIC_OR_DEPARTMENT_OTHER): Payer: Medicare Other | Admitting: Internal Medicine

## 2019-11-06 DIAGNOSIS — K520 Gastroenteritis and colitis due to radiation: Secondary | ICD-10-CM | POA: Diagnosis not present

## 2019-11-06 NOTE — Progress Notes (Addendum)
[name], [name] ([id number]) Visit Report for 11/06/2019 HBO Details Patient Name: Date of Service: [name], [name] 11/06/2019 8:00 AM Medical Record [id number] Patient Account Number: [id number] Date of Birth/Sex: Treating RN: [birth year] (74 y.o. [name]) [name] Primary Care Gwendalynn Eckstrom: [name] Other Clinician: [name] Referring Amare Kontos: Treating Javaria Knapke/Extender:[name], [name], [name] in Treatment: 4 HBO Treatment Course Details Treatment Course Number: 1 Ordering Kamarrion Stfort: [name] Total Treatments Ordered: 40 HBO Treatment Start Date: 10/15/2019 HBO Indication: Late Effect of Radiation HBO Treatment Details Treatment Number: 16 Patient Type: Outpatient Chamber Type: Monoplace Chamber Serial #: [id number] Treatment Protocol: 2.5 ATA with 90 minutes oxygen, with two 5 minute air breaks Treatment Details Compression Rate Down: 1.5 psi / minute De-Compression Rate Up: 2.0 psi / minute Air breaks and CompressTx Pressure breathing periods DecompressDecompress Begins Reached (leave unused spaces Begins Ends blank) Chamber Pressure (ATA)1 2.5 2.5 2.5 2.5 2.5 --2.5 1 Clock Time (24 hr) 08:12 08:25 08:5509:0009:3009:35--10:05 10:17 Treatment Length: 125 (minutes) Treatment Segments: 4 Vital Signs Capillary Blood Glucose Reference Range: 80 - 120 mg / dl HBO Diabetic Blood Glucose Intervention Range: <131 mg/dl or >249 mg/dl Time Vitals Blood Respiratory Capillary Blood Glucose Pulse Action Type: Pulse: Temperature: Taken: Pressure: Rate: Glucose (mg/dl): Meter #: Oximetry (%) Taken: Pre 08:05 155/79 59 17 98.6 Post 10:20 154/88 52 15 98.2 Treatment Response Treatment Toleration: Well Treatment Completion Treatment Completed without Adverse Event Status: Wrangler Penning Notes No concerns with treatment given Physician HBO Attestation: I certify that I supervised this HBO treatment in accordance with Medicare guidelines. A  trained Yes Yes emergency response team is readily available per hospital policies and procedures. Continue HBOT as ordered. Yes Electronic Signature(s) Signed: 11/06/2019 5:48:31 PM By: [name] MD Previous Signature: 11/06/2019 3:29:05 PM Version By: [name] EMT/HBOT Entered By: [name] on 11/06/2019 17:44:25 -------------------------------------------------------------------------------- HBO Safety Checklist Details Patient Name: Date of Service: [name], [name] 11/06/2019 8:00 AM Medical Record [id number] Patient Account Number: [id number] Date of Birth/Sex: Treating RN: [birth year] (74 y.o. [name]) [name] Primary Care Thayer Embleton: [name] Other Clinician: [name] Referring Deanthony Maull: Treating Graham Doukas/Extender:[name], [name], [name] in Treatment: 4 HBO Safety Checklist Items Safety Checklist Consent Form Signed Patient voided / foley secured and emptied When did you last eato n/a Last dose of injectable or oral agent n/a NA Ostomy pouch emptied and vented if applicable NA All implantable devices assessed, documented and approved NA Intravenous access site secured and place Valuables secured Linens and cotton and cotton/polyester blend (less than 51% polyester) Personal oil-based products / skin lotions / body lotions removed NA Wigs or hairpieces removed NA Smoking or tobacco materials removed Books / newspapers / magazines / loose paper removed Cologne, aftershave, perfume and deodorant removed Jewelry removed (may wrap wedding band) NA Make-up removed Hair care products removed NA Battery operated devices (external) removed NA Heating patches and chemical warmers removed NA Titanium eyewear removed NA Nail polish cured greater than 10 hours NA Casting material cured greater than 10 hours NA Hearing aids removed NA Loose dentures or partials removed NA Prosthetics have been removed Patient demonstrates correct use of  air break device (if applicable) Patient concerns have been addressed Patient grounding bracelet on and cord attached to chamber Specifics for Inpatients (complete in addition to above) Medication sheet sent with patient Intravenous medications needed or due during therapy sent with patient Drainage tubes (e.g. nasogastric tube or chest tube secured and vented) Endotracheal or Tracheotomy tube secured Cuff deflated of air and inflated with saline  Airway suctioned Electronic Signature(s) Signed: 11/06/2019 8:19:23 AM By: [name] EMT/HBOT Entered By: [name] on 11/06/2019 08:19:23

## 2019-11-06 NOTE — Progress Notes (Signed)
[name], [name] ([id number]) Visit Report for 11/06/2019 SuperBill Details Patient Name: Date of Service: [name], [name] 11/06/2019 Medical Record [id number] Patient Account Number: [id number] Date of Birth/Sex: Treating RN: [birth year] (73 y.o. 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

## 2019-11-06 NOTE — Progress Notes (Signed)
[name] [name] [name] [name] ([id number]) Visit Report for 11/06/2019 Arrival Information Details Patient Name: Date of Service: [name] [name] [name] [name] 11/06/2019 8:00 AM Medical Record [id number] Patient Account Number: [id number] Date of Birth/Sex: Treating RN: 09/15/1945 (73 y.o. [name] [name]) [name] Primary Care Ailsa Mireles: [name] Other Clinician: [name] Referring Darleny Sem: Treating Fox Salminen/Extender:[name], [name], [name] in Treatment: 4 Visit Information History Since Last Visit Added or deleted any medications: No Patient Arrived: Ambulatory Any new allergies or adverse reactions: No Arrival Time: 08:00 Had a fall or experienced change in No Accompanied By: self activities of daily living that may affect Transfer Assistance: None risk of falls: Patient Identification Verified: Yes Signs or symptoms of abuse/neglect since last No Secondary Verification Process Yes visito Completed: Hospitalized since last visit: No Patient Requires Transmission-Based No Implantable device outside of the clinic excluding No Precautions: cellular tissue based products placed in the center Patient Has Alerts: No since last visit: Pain Present Now: No Electronic Signature(s) Signed: 11/06/2019 3:29:05 PM By: [name] EMT/HBOT Entered By: [name] on 11/06/2019 08:18:25 -------------------------------------------------------------------------------- Encounter Discharge Information Details Patient Name: Date of Service: [name] [name] 11/06/2019 8:00 AM Medical Record [id number] Patient Account Number: [id number] Date of Birth/Sex: Treating RN: Jul 26, 1946 (73 y.o. [name] [name]) [name] Primary Care Fadia Marlar: [name] Other Clinician: [name] Referring Marcellene Shivley: Treating Caitlen Worth/Extender:[name], [name], [name] in Treatment: 4 Encounter Discharge Information Items Discharge Condition: Stable Ambulatory Status: Ambulatory Discharge Destination:  Home Transportation: Private Auto Accompanied By: self Schedule Follow-up Appointment: Yes Clinical Summary of Care: Patient Declined Electronic Signature(s) Signed: 11/06/2019 3:29:05 PM By: [name] EMT/HBOT Entered By: [name] on 11/06/2019 10:30:32 -------------------------------------------------------------------------------- Patient/Caregiver Education Details Patient Name: Date of Service: [name] [name] 3/9/2021andnbsp8:00 AM Medical Record [id number] Patient Account Number: [id number] Date of Birth/Gender: Treating RN: Nov 19, 1945 (73 y.o. [name] [name] Primary Care Physician: [name] Other Clinician: [name] Referring Physician: Treating Physician/Extender:[name], [name], [name] in Treatment: 4 Education Assessment Education Provided To: Patient Education Topics Provided Hyperbaric Oxygenation: Methods: Explain/Verbal Responses: State content correctly Electronic Signature(s) Signed: 11/06/2019 3:29:05 PM By: [name] EMT/HBOT Entered By: [name] on 11/06/2019 10:30:14 -------------------------------------------------------------------------------- Vitals Details Patient Name: Date of Service: [name] [name] 11/06/2019 8:00 AM Medical Record [id number] Patient Account Number: [id number] Date of Birth/Sex: Treating RN: 1945/12/01 (73 y.o. [name] [name]) [name] Primary Care Ensley Blas: [name] Other Clinician: [name] Referring Samreet Edenfield: Treating Veniamin Kincaid/Extender:[name], [name], [name] in Treatment: 4 Vital Signs Time Taken: 08:05 Temperature (F): 98.6 Height (in): 72 Pulse (bpm): 59 Weight (lbs): 240 Respiratory Rate (breaths/min): 17 Body Mass Index (BMI): 32.5 Blood Pressure (mmHg): 155/79 Reference Range: 80 - 120 mg / dl Electronic Signature(s) Signed: 11/06/2019 3:29:05 PM By: [name] EMT/HBOT Entered By: [name] on 11/06/2019 08:18:44

## 2019-11-07 ENCOUNTER — Other Ambulatory Visit: Payer: Self-pay

## 2019-11-07 ENCOUNTER — Encounter (HOSPITAL_BASED_OUTPATIENT_CLINIC_OR_DEPARTMENT_OTHER): Payer: Medicare Other | Admitting: Physician Assistant

## 2019-11-07 DIAGNOSIS — K520 Gastroenteritis and colitis due to radiation: Secondary | ICD-10-CM | POA: Diagnosis not present

## 2019-11-07 NOTE — Progress Notes (Signed)
[name], [name] ([id number]) Visit Report for 11/07/2019 Arrival Information Details Patient Name: Date of Service: [name], [name] 11/07/2019 8:00 AM Medical Record [id number] Patient Account Number: [id number] Date of Birth/Sex: Treating RN: [birth year] (74 y.o. [name] Primary Care Vieno Tarrant: [name] Other Clinician: [name] Referring Ivanna Kocak: Treating Gentle Hoge/Extender:Stone III, [name], [name] in Treatment: 4 Visit Information History Since Last Visit Added or deleted any medications: No Patient Arrived: Ambulatory Any new allergies or adverse reactions: No Arrival Time: 07:50 Had a fall or experienced change in No Accompanied By: self activities of daily living that may affect Transfer Assistance: None risk of falls: Patient Identification Verified: Yes Signs or symptoms of abuse/neglect since last No Secondary Verification Process Yes visito Completed: Hospitalized since last visit: No Patient Requires Transmission-Based No Implantable device outside of the clinic excluding No Precautions: cellular tissue based products placed in the center Patient Has Alerts: No since last visit: Pain Present Now: No Electronic Signature(s) Signed: 11/07/2019 2:27:25 PM By: [name] EMT/HBOT Entered By: [name] on 11/07/2019 08:36:38 -------------------------------------------------------------------------------- Encounter Discharge Information Details Patient Name: Date of Service: [name], [name] 11/07/2019 8:00 AM Medical Record [id number] Patient Account Number: [id number] Date of Birth/Sex: Treating RN: [birth year] (74 y.o. [name] Primary Care Gennie Eisinger: [name] Other Clinician: [name] Referring Anav Lammert: Treating Hussien Greenblatt/Extender:Stone III, [name], [name] in Treatment: 4 Encounter Discharge Information Items Discharge Condition: Stable Ambulatory Status: Ambulatory Discharge  Destination: Home Transportation: Private Auto Accompanied By: self Schedule Follow-up Appointment: Yes Clinical Summary of Care: Patient Declined Electronic Signature(s) Signed: 11/07/2019 2:27:25 PM By: [name] EMT/HBOT Entered By: [name] on 11/07/2019 10:21:06 -------------------------------------------------------------------------------- Patient/Caregiver Education Details Patient Name: Date of Service: [name], [name] 3/10/2021andnbsp8:00 AM Medical Record [id number] Patient Account Number: [id number] Date of Birth/Gender: Treating RN: 1945-12-20 (73 y.o. [name] Primary Care Physician: [name] Other Clinician: [name] Referring Physician: Treating Physician/Extender:Stone III, [name], [name] in Treatment: 4 Education Assessment Education Provided To: Patient Education Topics Provided Hyperbaric Oxygenation: Methods: Explain/Verbal Responses: State content correctly Electronic Signature(s) Signed: 11/07/2019 2:27:25 PM By: [name] EMT/HBOT Entered By: [name] on 11/07/2019 10:20:52 -------------------------------------------------------------------------------- Vitals Details Patient Name: Date of Service: [name], [name] 11/07/2019 8:00 AM Medical Record [id number] Patient Account Number: [id number] Date of Birth/Sex: Treating RN: 1945-12-26 (74 y.o. [name] Primary Care Shaquavia Whisonant: [name] Other Clinician: [name] Referring Izrael Peak: Treating Ustin Cruickshank/Extender:Stone III, [name], [name] in Treatment: 4 Vital Signs Time Taken: 07:55 Temperature (F): 97.5 Height (in): 72 Pulse (bpm): 59 Weight (lbs): 240 Respiratory Rate (breaths/min): 16 Body Mass Index (BMI): 32.5 Blood Pressure (mmHg): 132/65 Reference Range: 80 - 120 mg / dl Electronic Signature(s) Signed: 11/07/2019 2:27:25 PM By: [name] EMT/HBOT Entered By: [name] on 11/07/2019  08:36:51

## 2019-11-07 NOTE — Progress Notes (Addendum)
[name] ([id number]) Visit Report for 11/07/2019 HBO Details Patient Name: Date of Service: [name] 11/07/2019 8:00 AM Medical Record [id number] Patient Account Number: [id number] Date of Birth/Sex: Treating RN: 02/26/1946 (74 y.o. [name] Primary Care Brittnae Aschenbrenner: [name] Other Clinician: [name] Referring Ova Meegan: Treating Norris Bodley/Extender:Stone III, [name], [name] in Treatment: 4 HBO Treatment Course Details Treatment Course Number: 1 Ordering Ruberta Holck: [name] Total Treatments Ordered: 40 HBO Treatment Start Date: 10/15/2019 HBO Indication: Late Effect of Radiation HBO Treatment Details Treatment Number: 17 Patient Type: Outpatient Chamber Type: Monoplace Chamber Serial #: [id number] Treatment Protocol: 2.5 ATA with 90 minutes oxygen, with two 5 minute air breaks Treatment Details Compression Rate Down: 2.0 psi / minute De-Compression Rate Up: 2.0 psi / minute Air breaks and CompressTx Pressure breathing periods DecompressDecompress Begins Reached (leave unused spaces Begins Ends blank) Chamber Pressure (ATA)1 2.5 2.5 2.5 2.5 2.5 --2.5 1 Clock Time (24 hr) 08:06 08:18 [id number] 10:10 Treatment Length: 124 (minutes) Treatment Segments: 4 Vital Signs Capillary Blood Glucose Reference Range: 80 - 120 mg / dl HBO Diabetic Blood Glucose Intervention Range: <131 mg/dl or >249 mg/dl Time Vitals Blood Respiratory Capillary Blood Glucose Pulse Action Type: Pulse: Temperature: Taken: Pressure: Rate: Glucose (mg/dl): Meter #: Oximetry (%) Taken: Pre 07:55 132/65 59 16 97.5 Post 10:12 147/69 54 14 98.2 Treatment Response Treatment Toleration: Well Treatment Completion Treatment Completed without Adverse Event Status: Physician HBO Attestation: I certify that I supervised this HBO treatment in accordance with Medicare guidelines. A trained Yes emergency response team is readily available  per hospital policies and procedures. Continue HBOT as ordered. Yes Electronic Signature(s) Signed: 11/07/2019 5:26:47 PM By: Worthy Keeler PA-C Previous Signature: 11/07/2019 2:27:25 PM Version By: [name] EMT/HBOT Entered By: Worthy Keeler on 11/07/2019 17:26:47 -------------------------------------------------------------------------------- HBO Safety Checklist Details Patient Name: Date of Service: [name], [name] 11/07/2019 8:00 AM Medical Record [id number] Patient Account Number: [id number] Date of Birth/Sex: Treating RN: December 22, 1945 (74 y.o. [name] Primary Care [name]: [name] Other Clinician: [name] Referring [name]: Treating Graceanna Theissen/Extender:Stone III, [name], [name] in Treatment: 4 HBO Safety Checklist Items Safety Checklist Consent Form Signed Patient voided / foley secured and emptied When did you last eato n/a Last dose of injectable or oral agent n/a NA Ostomy pouch emptied and vented if applicable NA All implantable devices assessed, documented and approved NA Intravenous access site secured and place Valuables secured Linens and cotton and cotton/polyester blend (less than 51% polyester) Personal oil-based products / skin lotions / body lotions removed NA Wigs or hairpieces removed NA Smoking or tobacco materials removed Books / newspapers / magazines / loose paper removed Cologne, aftershave, perfume and deodorant removed Jewelry removed (may wrap wedding band) NA Make-up removed Hair care products removed NA Battery operated devices (external) removed NA Heating patches and chemical warmers removed NA Titanium eyewear removed NA Nail polish cured greater than 10 hours NA Casting material cured greater than 10 hours NA Hearing aids removed NA Loose dentures or partials removed NA Prosthetics have been removed Patient demonstrates correct use of air break device (if applicable) Patient concerns have  been addressed Patient grounding bracelet on and cord attached to chamber Specifics for Inpatients (complete in addition to above) Medication sheet sent with patient Intravenous medications needed or due during therapy sent with patient Drainage tubes (e.g. nasogastric tube or chest tube secured and vented) Endotracheal or Tracheotomy tube secured Cuff deflated of air and inflated with saline Airway suctioned Electronic Signature(s)  Signed: 11/07/2019 8:37:25 AM By: [name] EMT/HBOT Entered By: [name] on 11/07/2019 08:37:25

## 2019-11-07 NOTE — Progress Notes (Signed)
LOAY, ILES ([id number]) Visit Report for 11/07/2019 Problem List Details Patient Name: Date of Service: [name], [name] 11/07/2019 8:00 AM Medical Record [id number] Patient Account Number: [id number] Date of Birth/Sex: Treating RN: 31-May-1946 (74 y.o. 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

## 2019-11-08 ENCOUNTER — Encounter (HOSPITAL_BASED_OUTPATIENT_CLINIC_OR_DEPARTMENT_OTHER): Payer: Medicare Other | Admitting: Internal Medicine

## 2019-11-08 DIAGNOSIS — K520 Gastroenteritis and colitis due to radiation: Secondary | ICD-10-CM | POA: Diagnosis not present

## 2019-11-08 NOTE — Progress Notes (Addendum)
[name] ([id number]) Visit Report for 11/08/2019 HBO Details Patient Name: Date of Service: [name], [name] 11/08/2019 8:00 AM Medical Record [id number] Patient Account Number: [id number] Date of Birth/Sex: Treating RN: 03-16-1946 (74 y.o. M) Primary Care Yahsir Wickens: [name] Other Clinician: [name] Referring Analyse Angst: Treating Lynore Coscia/Extender:[name], [name], [name] in Treatment: 4 HBO Treatment Course Details Treatment Course Number: 1 Ordering Ronnell Clinger: [name] Total Treatments Ordered: 40 HBO Treatment Start Date: 10/15/2019 HBO Indication: Late Effect of Radiation HBO Treatment Details Treatment Number: 18 Patient Type: Outpatient Chamber Type: Monoplace Chamber Serial #: [id number] Treatment Protocol: 2.5 ATA with 90 minutes oxygen, with two 5 minute air breaks Treatment Details Compression Rate Down: 2.0 psi / minute De-Compression Rate Up: 2.0 psi / minute Air breaks and CompressTx Pressure breathing periods DecompressDecompress Begins Reached (leave unused spaces Begins Ends blank) Chamber Pressure (ATA)1 2.5 2.5 2.5 2.5 2.5 --2.5 1 Clock Time (24 hr) 08:05 08:17 [id number] 10:09 Treatment Length: 124 (minutes) Treatment Segments: 4 Vital Signs Capillary Blood Glucose Reference Range: 80 - 120 mg / dl HBO Diabetic Blood Glucose Intervention Range: <131 mg/dl or >249 mg/dl Time Vitals Blood Respiratory Capillary Blood Glucose Pulse Action Type: Pulse: Temperature: Taken: Pressure: Rate: Glucose (mg/dl): Meter #: Oximetry (%) Taken: Pre 07:55 135/59 56 17 98.2 Post 10:11 177/87 53 15 97.8 Treatment Response Treatment Toleration: Well Treatment Completion Treatment Completed without Adverse Event Status: Marveline Profeta Notes No concerns with treatment given Physician HBO Attestation: I certify that I supervised this HBO treatment in accordance with Medicare guidelines. A trained Yes Yes emergency  response team is readily available per hospital policies and procedures. Continue HBOT as ordered. Yes Electronic Signature(s) Signed: 11/10/2019 7:03:15 AM By: [name] MD Previous Signature: 11/08/2019 4:27:59 PM Version By: [name] EMT/HBOT Entered By: [name] on 11/09/2019 07:24:56 -------------------------------------------------------------------------------- HBO Safety Checklist Details Patient Name: Date of Service: [name] 11/08/2019 8:00 AM Medical Record [id number] Patient Account Number: [id number] Date of Birth/Sex: Treating RN: 10/17/1945 (74 y.o. M) Primary Care Leenah Seidner: [name] Other Clinician: [name] Referring Venice Liz: Treating Kaylen Nghiem/Extender:[name], [name], [name] in Treatment: 4 HBO Safety Checklist Items Safety Checklist Consent Form Signed Patient voided / foley secured and emptied When did you last eato n/a Last dose of injectable or oral agent n/a NA Ostomy pouch emptied and vented if applicable NA All implantable devices assessed, documented and approved NA Intravenous access site secured and place Valuables secured Linens and cotton and cotton/polyester blend (less than 51% polyester) Personal oil-based products / skin lotions / body lotions removed NA Wigs or hairpieces removed NA Smoking or tobacco materials removed Books / newspapers / magazines / loose paper removed Cologne, aftershave, perfume and deodorant removed Jewelry removed (may wrap wedding band) NA Make-up removed Hair care products removed NA Battery operated devices (external) removed NA Heating patches and chemical warmers removed NA Titanium eyewear removed NA Nail polish cured greater than 10 hours NA Casting material cured greater than 10 hours NA Hearing aids removed NA Loose dentures or partials removed NA Prosthetics have been removed Patient demonstrates correct use of air break device (if  applicable) Patient concerns have been addressed Patient grounding bracelet on and cord attached to chamber Specifics for Inpatients (complete in addition to above) Medication sheet sent with patient Intravenous medications needed or due during therapy sent with patient Drainage tubes (e.g. nasogastric tube or chest tube secured and vented) Endotracheal or Tracheotomy tube secured Cuff deflated of air and inflated with saline Airway suctioned Electronic Signature(s)  Signed: 11/08/2019 8:22:27 AM By: [name] EMT/HBOT Entered By: [name] on 11/08/2019 08:22:26

## 2019-11-08 NOTE — Progress Notes (Signed)
KAINON, DOONEY ([id number]) Visit Report for 11/08/2019 Arrival Information Details Patient Name: Date of Service: STANISLAV, CZERWONKA 11/08/2019 8:00 AM Medical Record [id number] Patient Account Number: [id number] Date of Birth/Sex: Treating RN: Oct 25, 1945 (74 y.o. M) Primary Care Charbel Los: [name] Other Clinician: [name] Referring Stephanine Reas: Treating Claudett Bayly/Extender:[name], [name], [name] in Treatment: 4 Visit Information History Since Last Visit Added or deleted any medications: No Patient Arrived: Ambulatory Any new allergies or adverse reactions: No Arrival Time: 07:50 Had a fall or experienced change in No Accompanied By: self activities of daily living that may affect Transfer Assistance: None risk of falls: Patient Identification Verified: Yes Signs or symptoms of abuse/neglect since last No Secondary Verification Process Yes visito Completed: Hospitalized since last visit: No Patient Requires Transmission-Based No Implantable device outside of the clinic excluding No Precautions: cellular tissue based products placed in the center Patient Has Alerts: No since last visit: Pain Present Now: No Electronic Signature(s) Signed: 11/08/2019 4:27:59 PM By: [name] EMT/HBOT Entered By: [name] on 11/08/2019 08:21:24 -------------------------------------------------------------------------------- Encounter Discharge Information Details Patient Name: Date of Service: ASKIA, ZENK 11/08/2019 8:00 AM Medical Record [id number] Patient Account Number: [id number] Date of Birth/Sex: Treating RN: [birth year] (74 y.o. M) Primary Care Kariya Lavergne: [name] Other Clinician: [name] Referring Keyana Guevara: Treating Keirsten Matuska/Extender:[name], [name], [name] in Treatment: 4 Encounter Discharge Information Items Discharge Condition: Stable Ambulatory Status: Ambulatory Discharge Destination: Home Transportation:  Private Auto Accompanied By: self Schedule Follow-up Appointment: Yes Clinical Summary of Care: Patient Declined Electronic Signature(s) Signed: 11/08/2019 4:27:59 PM By: [name] EMT/HBOT Entered By: [name] on 11/08/2019 11:32:34 -------------------------------------------------------------------------------- Patient/Caregiver Education Details Patient Name: Date of Service: MARKEAL, DELCONTE 3/11/2021andnbsp8:00 AM Medical Record [id number] Patient Account Number: [id number] Date of Birth/Gender: Treating RN: 1946-03-19 (73 y.o. M) Primary Care Physician: [name] Other Clinician: [name] Referring Physician: Treating Physician/Extender:[name], [name], [name] in Treatment: 4 Education Assessment Education Provided To: Patient Education Topics Provided Hyperbaric Oxygenation: Methods: Explain/Verbal Responses: State content correctly Electronic Signature(s) Signed: 11/08/2019 4:27:59 PM By: [name] EMT/HBOT Entered By: [name] on 11/08/2019 11:32:23 -------------------------------------------------------------------------------- Vitals Details Patient Name: Date of Service: ANDONY, CARONA 11/08/2019 8:00 AM Medical Record [id number] Patient Account Number: [id number] Date of Birth/Sex: Treating RN: [birth year] (74 y.o. M) Primary Care Anamae Rochelle: [name] Other Clinician: [name] Referring Rayshawn Visconti: Treating Nicolae Vasek/Extender:[name], [name], [name] in Treatment: 4 Vital Signs Time Taken: 07:55 Temperature (F): 98.2 Height (in): 72 Pulse (bpm): 56 Weight (lbs): 240 Respiratory Rate (breaths/min): 17 Body Mass Index (BMI): 32.5 Blood Pressure (mmHg): 135/59 Reference Range: 80 - 120 mg / dl Electronic Signature(s) Signed: 11/08/2019 4:27:59 PM By: [name] EMT/HBOT Entered By: [name] on 11/08/2019 08:21:42

## 2019-11-09 ENCOUNTER — Other Ambulatory Visit: Payer: Self-pay

## 2019-11-09 ENCOUNTER — Encounter (HOSPITAL_BASED_OUTPATIENT_CLINIC_OR_DEPARTMENT_OTHER): Payer: Medicare Other | Admitting: Internal Medicine

## 2019-11-09 DIAGNOSIS — K520 Gastroenteritis and colitis due to radiation: Secondary | ICD-10-CM | POA: Diagnosis not present

## 2019-11-09 NOTE — Progress Notes (Signed)
[name], [name] ([id number]) Visit Report for 11/09/2019 Arrival Information Details Patient Name: Date of Service: [name], [name] 11/09/2019 8:00 AM Medical Record [id number] Patient Account Number: [id number] Date of Birth/Sex: Treating RN: 04-01-1946 (74 y.o. [name] Primary Care Troy Hartzog: [name] Other Clinician: [name] Referring Beyonka Pitney: Treating Alika Saladin/Extender:[name], [name], [name] in Treatment: 5 Visit Information History Since Last Visit Added or deleted any medications: No Patient Arrived: Ambulatory Any new allergies or adverse reactions: No Arrival Time: 07:50 Had a fall or experienced change in No Accompanied By: self activities of daily living that may affect Transfer Assistance: None risk of falls: Patient Identification Verified: Yes Signs or symptoms of abuse/neglect since last No Secondary Verification Process Yes visito Completed: Hospitalized since last visit: No Patient Requires Transmission-Based No Implantable device outside of the clinic excluding No Precautions: cellular tissue based products placed in the center Patient Has Alerts: No since last visit: Pain Present Now: No Electronic Signature(s) Signed: 11/09/2019 4:47:30 PM By: [name] EMT/HBOT Entered By: [name] on 11/09/2019 08:19:44 -------------------------------------------------------------------------------- Encounter Discharge Information Details Patient Name: Date of Service: [name], [name] 11/09/2019 8:00 AM Medical Record [id number] Patient Account Number: [id number] Date of Birth/Sex: Treating RN: 03/21/1946 (74 y.o. [name] Primary Care Amaria Mundorf: [name] Other Clinician: [name] Referring Harol Shabazz: Treating Irmalee Riemenschneider/Extender:[name], [name], [name] in Treatment: 5 Encounter Discharge Information Items Discharge Condition: Stable Ambulatory Status: Ambulatory Discharge  Destination: Home Transportation: Private Auto Accompanied By: self Schedule Follow-up Appointment: Yes Clinical Summary of Care: Patient Declined Electronic Signature(s) Signed: 11/09/2019 4:47:30 PM By: [name] EMT/HBOT Entered By: [name] on 11/09/2019 14:03:35 -------------------------------------------------------------------------------- Patient/Caregiver Education Details Patient Name: Date of Service: [name], [name] 3/12/2021andnbsp8:00 AM Medical Record [id number] Patient Account Number: [id number] Date of Birth/Gender: Treating RN: January 29, 1946 (73 y.o. [name] Primary Care Physician: [name] Other Clinician: [name] Referring Physician: Treating Physician/Extender:[name], [name], [name] in Treatment: 5 Education Assessment Education Provided To: Patient Education Topics Provided Hyperbaric Oxygenation: Methods: Explain/Verbal Responses: State content correctly Electronic Signature(s) Signed: 11/09/2019 4:47:30 PM By: [name] EMT/HBOT Entered By: [name] on 11/09/2019 14:03:10 -------------------------------------------------------------------------------- Vitals Details Patient Name: Date of Service: [name], [name] 11/09/2019 8:00 AM Medical Record [id number] Patient Account Number: [id number] Date of Birth/Sex: Treating RN: 06-Oct-1945 (74 y.o. [name] Primary Care Terisha Losasso: [name] Other Clinician: [name] Referring Kimmie Doren: Treating Markeia Harkless/Extender:[name], [name], [name] in Treatment: 5 Vital Signs Time Taken: 07:55 Temperature (F): 98.4 Height (in): 72 Pulse (bpm): 56 Weight (lbs): 240 Respiratory Rate (breaths/min): 17 Body Mass Index (BMI): 32.5 Blood Pressure (mmHg): 159/69 Reference Range: 80 - 120 mg / dl Electronic Signature(s) Signed: 11/09/2019 4:47:30 PM By: [name] EMT/HBOT Entered By: [name] on 11/09/2019  08:20:01

## 2019-11-09 NOTE — Progress Notes (Addendum)
[name], [name] ([id number]) Visit Report for 11/09/2019 HBO Details Patient Name: Date of Service: [name], [name] 11/09/2019 8:00 AM Medical Record [id number] Patient Account Number: [id number] Date of Birth/Sex: Treating RN: Nov 15, 1945 (74 y.o. [name] Primary Care Ronald Vinsant: [name] Other Clinician: [name] Referring Lovenia Debruler: Treating Tashiya Souders/Extender:[name], [name], [name] in Treatment: 5 HBO Treatment Course Details Treatment Course Number: 1 Ordering Milagros Middendorf: [name] Total Treatments Ordered: 40 HBO Treatment Start Date: 10/15/2019 HBO Indication: Late Effect of Radiation HBO Treatment Details Treatment Number: 19 Patient Type: Outpatient Chamber Type: Monoplace Chamber Serial #: [id number] Treatment Protocol: 2.5 ATA with 90 minutes oxygen, with two 5 minute air breaks Treatment Details Compression Rate Down: 2.0 psi / minute De-Compression Rate Up: 2.0 psi / minute Air breaks and CompressTx Pressure breathing periods DecompressDecompress Begins Reached (leave unused spaces Begins Ends blank) Chamber Pressure (ATA)1 2.5 2.5 2.5 2.5 2.5 --2.5 1 Clock Time (24 hr) 08:06 08:18 [id number] 10:10 Treatment Length: 124 (minutes) Treatment Segments: 4 Vital Signs Capillary Blood Glucose Reference Range: 80 - 120 mg / dl HBO Diabetic Blood Glucose Intervention Range: <131 mg/dl or >249 mg/dl Time Vitals Blood Respiratory Capillary Blood Glucose Pulse Action Type: Pulse: Temperature: Taken: Pressure: Rate: Glucose (mg/dl): Meter #: Oximetry (%) Taken: Pre 07:55 159/69 56 17 98.4 Post 10:12 167/72 55 14 98 Treatment Response Treatment Toleration: Well Treatment Completion Treatment Completed without Adverse Event Status: Alexxus Sobh Notes No concerns for treatment given. Patient spoke to me about wanting something to remove earwax I suggested Debrox otic over-the-counter. Physician HBO Attestation: I  certify that I supervised this HBO treatment in accordance with Medicare guidelines. A trained Yes emergency response team is readily available per hospital policies and procedures. Continue HBOT as ordered. Yes Electronic Signature(s) Signed: 11/12/2019 8:43:01 AM By: [name] MD Previous Signature: 11/09/2019 4:47:30 PM Version By: [name] EMT/HBOT Entered By: [name] on 11/09/2019 17:27:49 -------------------------------------------------------------------------------- HBO Safety Checklist Details Patient Name: Date of Service: [name], [name] 11/09/2019 8:00 AM Medical Record [id number] Patient Account Number: [id number] Date of Birth/Sex: Treating RN: Jun 18, 1946 (74 y.o. [name] Primary Care Adylynn Hertenstein: [name] Other Clinician: [name] Referring Malakie Balis: Treating Vasti Yagi/Extender:[name], [name], [name] in Treatment: 5 HBO Safety Checklist Items Safety Checklist Consent Form Signed Patient voided / foley secured and emptied When did you last eato n/a Last dose of injectable or oral agent n/a NA Ostomy pouch emptied and vented if applicable NA All implantable devices assessed, documented and approved NA Intravenous access site secured and place Valuables secured Linens and cotton and cotton/polyester blend (less than 51% polyester) Personal oil-based products / skin lotions / body lotions removed NA Wigs or hairpieces removed NA Smoking or tobacco materials removed Books / newspapers / magazines / loose paper removed Cologne, aftershave, perfume and deodorant removed Jewelry removed (may wrap wedding band) NA Make-up removed Hair care products removed NA Battery operated devices (external) removed NA Heating patches and chemical warmers removed NA Titanium eyewear removed NA Nail polish cured greater than 10 hours NA Casting material cured greater than 10 hours NA Hearing aids removed NA Loose dentures  or partials removed NA Prosthetics have been removed Patient demonstrates correct use of air break device (if applicable) Patient concerns have been addressed Patient grounding bracelet on and cord attached to chamber Specifics for Inpatients (complete in addition to above) Medication sheet sent with patient Intravenous medications needed or due during therapy sent with patient Drainage tubes (e.g. nasogastric tube or chest tube secured and  vented) Endotracheal or Tracheotomy tube secured Cuff deflated of air and inflated with saline Airway suctioned Electronic Signature(s) Signed: 11/09/2019 8:20:35 AM By: [name] EMT/HBOT Entered By: [name] on 11/09/2019 08:20:35

## 2019-11-10 NOTE — Progress Notes (Signed)
PACER, WEHR ([id number]) Visit Report for 11/08/2019 SuperBill Details Patient Name: Date of Service: [name], [name] 11/08/2019 Medical Record [id number] Patient Account Number: [id number] Date of Birth/Sex: Treating RN: Jul 19, 1946 (74 y.o. 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

## 2019-11-12 ENCOUNTER — Encounter (HOSPITAL_BASED_OUTPATIENT_CLINIC_OR_DEPARTMENT_OTHER): Payer: Medicare Other | Admitting: Internal Medicine

## 2019-11-12 ENCOUNTER — Other Ambulatory Visit: Payer: Self-pay

## 2019-11-12 DIAGNOSIS — K520 Gastroenteritis and colitis due to radiation: Secondary | ICD-10-CM | POA: Diagnosis not present

## 2019-11-12 NOTE — Progress Notes (Signed)
LIM, JANET ([id number]) Visit Report for 11/12/2019 Arrival Information Details Patient Name: Date of Service: [name], [name] 11/12/2019 8:00 AM Medical Record [id number] Patient Account Number: [id number] Date of Birth/Sex: Treating RN: 1946-07-24 (74 y.o. M) Primary Care Analayah Brooke: [name] Other Clinician: [name] Referring Maisie Hauser: Treating Arsh Feutz/Extender:[name], [name], [name] in Treatment: 5 Visit Information History Since Last Visit Added or deleted any medications: No Patient Arrived: Ambulatory Any new allergies or adverse reactions: No Arrival Time: 08:00 Had a fall or experienced change in No Accompanied By: self activities of daily living that may affect Transfer Assistance: None risk of falls: Patient Identification Verified: Yes Signs or symptoms of abuse/neglect since last No Secondary Verification Process Yes visito Completed: Hospitalized since last visit: No Patient Requires Transmission-Based No Implantable device outside of the clinic excluding No Precautions: cellular tissue based products placed in the center Patient Has Alerts: No since last visit: Pain Present Now: No Electronic Signature(s) Signed: 11/12/2019 5:11:29 PM By: [name] EMT/HBOT Entered By: [name] on 11/12/2019 08:25:23 -------------------------------------------------------------------------------- Encounter Discharge Information Details Patient Name: Date of Service: [name], [name] 11/12/2019 8:00 AM Medical Record [id number] Patient Account Number: [id number] Date of Birth/Sex: Treating RN: 1945/09/12 (74 y.o. [name] Primary Care Semaja Lymon: [name] Other Clinician: [name] Referring Zeeshan Korte: Treating Falisha Osment/Extender:[name], [name], [name] in Treatment: 5 Encounter Discharge Information Items Discharge Condition: Stable Ambulatory Status: Ambulatory Discharge Destination:  Home Transportation: Private Auto Accompanied By: self Schedule Follow-up Appointment: Yes Clinical Summary of Care: Patient Declined Electronic Signature(s) Signed: 11/12/2019 5:11:29 PM By: [name] EMT/HBOT Entered By: [name] on 11/12/2019 11:04:58 -------------------------------------------------------------------------------- Patient/Caregiver Education Details Patient Name: Date of Service: [name], [name] 3/15/2021andnbsp8:00 AM Medical Record [id number] Patient Account Number: [id number] Date of Birth/Gender: Treating RN: [birth year] (74 y.o. [name] Primary Care Physician: [name] Other Clinician: [name] Referring Physician: Treating Physician/Extender:[name], [name], [name] in Treatment: 5 Education Assessment Education Provided To: Patient Education Topics Provided Hyperbaric Oxygenation: Methods: Explain/Verbal Responses: State content correctly Electronic Signature(s) Signed: 11/12/2019 5:11:29 PM By: [name] EMT/HBOT Entered By: [name] on 11/12/2019 11:04:39 -------------------------------------------------------------------------------- Vitals Details Patient Name: Date of Service: [name], [name] 11/12/2019 8:00 AM Medical Record [id number] Patient Account Number: [id number] Date of Birth/Sex: Treating RN: April 16, 1946 (74 y.o. M) Primary Care Acelyn Basham: [name] Other Clinician: [name] Referring Adrienne Trombetta: Treating Mikaiah Stoffer/Extender:[name], [name], [name] in Treatment: 5 Vital Signs Time Taken: 08:05 Temperature (F): 98.3 Height (in): 72 Pulse (bpm): 50 Weight (lbs): 240 Respiratory Rate (breaths/min): 17 Body Mass Index (BMI): 32.5 Blood Pressure (mmHg): 164/82 Reference Range: 80 - 120 mg / dl Electronic Signature(s) Signed: 11/12/2019 5:11:29 PM By: [name] EMT/HBOT Entered By: [name] on 11/12/2019 08:26:31

## 2019-11-12 NOTE — Progress Notes (Addendum)
[name], [name] ([id number]) Visit Report for 11/12/2019 HBO Details Patient Name: Date of Service: [name], [name] 11/12/2019 8:00 AM Medical Record [id number] Patient Account Number: [id number] Date of Birth/Sex: Treating RN: [birth year] (74 y.o. [name] Primary Care [name]: [name] Other Clinician: Mikeal [name] Referring [name]: Treating [name]/Extender:[name], [name], [name] in Treatment: 5 HBO Treatment Course Details Treatment Course Number: 1 Ordering [name]: [name] Total Treatments Ordered: 40 HBO Treatment Start Date: 10/15/2019 HBO Indication: Late Effect of Radiation HBO Treatment Details Treatment Number: 20 Patient Type: Outpatient Chamber Type: Monoplace Chamber Serial #: [id number] Treatment Protocol: 2.5 ATA with 90 minutes oxygen, with two 5 minute air breaks Treatment Details Compression Rate Down: 2.0 psi / minute De-Compression Rate Up: 2.0 psi / minute Air breaks and CompressTx Pressure breathing periods DecompressDecompress Begins Reached (leave unused spaces Begins Ends blank) Chamber Pressure (ATA)1 2.5 2.5 2.5 2.5 2.5 --2.5 1 Clock Time (24 hr) 08:11 08:23 [id number] 10:15 Treatment Length: 124 (minutes) Treatment Segments: 4 Vital Signs Capillary Blood Glucose Reference Range: 80 - 120 mg / dl HBO Diabetic Blood Glucose Intervention Range: <131 mg/dl or >249 mg/dl Time Vitals Blood Respiratory Capillary Blood Glucose Pulse Action Type: Pulse: Temperature: Taken: Pressure: Rate: Glucose (mg/dl): Meter #: Oximetry (%) Taken: Pre 08:05 164/82 50 17 98.3 Post 10:17 154/74 55 15 98 Treatment Response Treatment Toleration: Well Treatment Completion Treatment Completed without Adverse Event Status: [name] Notes No concerns with treatment given Physician HBO Attestation: I certify that I supervised this HBO treatment in accordance with Medicare guidelines. A  trained Yes Yes emergency response team is readily available per hospital policies and procedures. Continue HBOT as ordered. Yes Electronic Signature(s) Signed: 11/13/2019 10:32:20 AM By: [name] Ham MD Previous Signature: 11/12/2019 5:11:29 PM Version By: [name] EMT/HBOT Entered By: [name] on 11/12/2019 17:49:06 -------------------------------------------------------------------------------- HBO Safety Checklist Details Patient Name: Date of Service: [name], [name] 11/12/2019 8:00 AM Medical Record [id number] Patient Account Number: [id number] Date of Birth/Sex: Treating RN: [birth year] (74 y.o. M) Primary Care Dyane Broberg: [name] Other Clinician: Mikeal [name] Referring Acire Tang: Treating [name] Seckinger/Extender:[name], [name], [name] in Treatment: 5 HBO Safety Checklist Items Safety Checklist Consent Form Signed Patient voided / foley secured and emptied When did you last eato n/a Last dose of injectable or oral agent n/a NA Ostomy pouch emptied and vented if applicable NA All implantable devices assessed, documented and approved NA Intravenous access site secured and place Valuables secured Linens and cotton and cotton/polyester blend (less than 51% polyester) Personal oil-based products / skin lotions / body lotions removed NA Wigs or hairpieces removed NA Smoking or tobacco materials removed Books / newspapers / magazines / loose paper removed Cologne, aftershave, perfume and deodorant removed Jewelry removed (may wrap wedding band) NA Make-up removed Hair care products removed NA Battery operated devices (external) removed NA Heating patches and chemical warmers removed NA Titanium eyewear removed NA Nail polish cured greater than 10 hours NA Casting material cured greater than 10 hours NA Hearing aids removed NA Loose dentures or partials removed NA Prosthetics have been removed Patient demonstrates correct use of air break  device (if applicable) Patient concerns have been addressed Patient grounding bracelet on and cord attached to chamber Specifics for Inpatients (complete in addition to above) Medication sheet sent with patient Intravenous medications needed or due during therapy sent with patient Drainage tubes (e.g. nasogastric tube or chest tube secured and vented) Endotracheal or Tracheotomy tube secured Cuff deflated of air and inflated with saline Airway suctioned  Electronic Signature(s) Signed: 11/12/2019 8:27:42 AM By: [name] EMT/HBOT Entered By: [name] on 11/12/2019 08:27:41

## 2019-11-12 NOTE — Progress Notes (Signed)
ONESIMO, SAVINO ([id number]) Visit Report for 11/09/2019 SuperBill Details Patient Name: Date of Service: [name], [name] 11/09/2019 Medical Record [id number] Patient Account Number: [id number] Date of Birth/Sex: Treating RN: 19-Jun-1946 (74 y.o. 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

## 2019-11-13 ENCOUNTER — Encounter (HOSPITAL_BASED_OUTPATIENT_CLINIC_OR_DEPARTMENT_OTHER): Payer: Medicare Other | Admitting: Internal Medicine

## 2019-11-13 DIAGNOSIS — K520 Gastroenteritis and colitis due to radiation: Secondary | ICD-10-CM | POA: Diagnosis not present

## 2019-11-13 NOTE — Progress Notes (Signed)
JONQUEZ, COURIER ([id number]) Visit Report for 11/13/2019 SuperBill Details Patient Name: Date of Service: JAREL, FEULNER 11/13/2019 Medical Record [id number] Patient Account Number: [id number] Date of Birth/Sex: Treating RN: December 04, 1945 (73 y.o. 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

## 2019-11-13 NOTE — Progress Notes (Signed)
[name], [name] ([id number]) Visit Report for 11/13/2019 Arrival Information Details Patient Name: Date of Service: [name], [name] 11/13/2019 8:00 AM Medical Record [id number] Patient Account Number: [id number] Date of Birth/Sex: Treating RN: 04-15-1946 (73 y.o. [name]) [name] Primary Care August Gosser: [name] Other Clinician: [name] Referring Charan Prieto: Treating Orvetta Danielski/Extender:[name], [name], [name] in Treatment: 5 Visit Information History Since Last Visit Added or deleted any medications: No Patient Arrived: Ambulatory Any new allergies or adverse reactions: No Arrival Time: 08:00 Had a fall or experienced change in No Accompanied By: self activities of daily living that may affect Transfer Assistance: None risk of falls: Patient Identification Verified: Yes Signs or symptoms of abuse/neglect since last No Secondary Verification Process Yes visito Completed: Hospitalized since last visit: No Patient Requires Transmission-Based No Implantable device outside of the clinic excluding No Precautions: cellular tissue based products placed in the center Patient Has Alerts: No since last visit: Pain Present Now: No Electronic Signature(s) Signed: 11/13/2019 4:45:30 PM By: [name] EMT/HBOT Entered By: [name] on 11/13/2019 08:27:24 -------------------------------------------------------------------------------- Encounter Discharge Information Details Patient Name: Date of Service: [name], [name] 11/13/2019 8:00 AM Medical Record [id number] Patient Account Number: [id number] Date of Birth/Sex: Treating RN: April 29, 1946 (73 y.o. [name]) [name] Primary Care Ildefonso Keaney: [name] Other Clinician: [name] Referring Dequan Kindred: Treating Dontavion Noxon/Extender:[name], [name], [name] in Treatment: 5 Encounter Discharge Information Items Discharge Condition: Stable Ambulatory Status: Ambulatory Discharge  Destination: Home Transportation: Private Auto Accompanied By: self Schedule Follow-up Appointment: Yes Clinical Summary of Care: Patient Declined Electronic Signature(s) Signed: 11/13/2019 4:45:30 PM By: [name] EMT/HBOT Entered By: [name] on 11/13/2019 10:40:25 -------------------------------------------------------------------------------- Patient/Caregiver Education Details Patient Name: Date of Service: [name], [name] 3/16/2021andnbsp8:00 AM Medical Record [id number] Patient Account Number: [id number] Date of Birth/Gender: Treating RN: 12-15-1945 (73 y.o. Oval Linsey Primary Care Physician: [name] Other Clinician: [name] Referring Physician: Treating Physician/Extender:[name], [name], [name] in Treatment: 5 Education Assessment Education Provided To: Patient Education Topics Provided Hyperbaric Oxygenation: Methods: Explain/Verbal Responses: State content correctly Electronic Signature(s) Signed: 11/13/2019 4:45:30 PM By: [name] EMT/HBOT Entered By: [name] on 11/13/2019 10:40:12 -------------------------------------------------------------------------------- Vitals Details Patient Name: Date of Service: [name] 11/13/2019 8:00 AM Medical Record [id number] Patient Account Number: [id number] Date of Birth/Sex: Treating RN: [birth year] (73 y.o. [name]) [name] Primary Care Sarya Linenberger: [name] Other Clinician: [name] Referring Nitasha Jewel: Treating Cyril Woodmansee/Extender:[name], [name], [name] in Treatment: 5 Vital Signs Time Taken: 08:05 Temperature (F): 97.7 Height (in): 72 Pulse (bpm): 59 Weight (lbs): 240 Respiratory Rate (breaths/min): 14 Body Mass Index (BMI): 32.5 Blood Pressure (mmHg): 144/65 Reference Range: 80 - 120 mg / dl Electronic Signature(s) Signed: 11/13/2019 4:45:30 PM By: [name] EMT/HBOT Entered By: [name] on 11/13/2019  08:27:38

## 2019-11-13 NOTE — Progress Notes (Addendum)
[name], [name] ([id number]) Visit Report for 11/13/2019 HBO Details Patient Name: Date of Service: [name], [name] 11/13/2019 8:00 AM Medical Record [id number] Patient Account Number: [id number] Date of Birth/Sex: Treating RN: 1946-05-10 (73 y.o. [name]) [name] Primary Care [name]: [name] Other Clinician: [name] Referring [name]: Treating [name]/Extender:[name], [name], [name] in Treatment: 5 HBO Treatment Course Details Treatment Course Number: 1 Ordering [name]: [name] Total Treatments Ordered: 40 HBO Treatment Start Date: 10/15/2019 HBO Indication: Late Effect of Radiation HBO Treatment Details Treatment Number: 21 Patient Type: Outpatient Chamber Type: Monoplace Chamber Serial #: [id number] Treatment Protocol: 2.5 ATA with 90 minutes oxygen, with two 5 minute air breaks Treatment Details Compression Rate Down: 2.0 psi / minute De-Compression Rate Up: 2.0 psi / minute Air breaks and CompressTx Pressure breathing periods DecompressDecompress Begins Reached (leave unused spaces Begins Ends blank) Chamber Pressure (ATA)1 2.5 2.5 2.5 2.5 2.5 --2.5 1 Clock Time (24 hr) 08:14 08:26 [id number] 10:18 Treatment Length: 124 (minutes) Treatment Segments: 4 Vital Signs Capillary Blood Glucose Reference Range: 80 - 120 mg / dl HBO Diabetic Blood Glucose Intervention Range: <131 mg/dl or >249 mg/dl Time Vitals Blood Respiratory Capillary Blood Glucose Pulse Action Type: Pulse: Temperature: Taken: Pressure: Rate: Glucose (mg/dl): Meter #: Oximetry (%) Taken: Pre 08:05 144/65 59 14 97.7 Post 10:20 158/80 56 16 98.1 Treatment Response Treatment Toleration: Well Treatment Completion Treatment Completed without Adverse Event Status: [name] Notes no concerns with treatment given Physician HBO Attestation: I certify that I supervised this HBO treatment in accordance with Medicare guidelines. A  trained Yes Yes emergency response team is readily available per hospital policies and procedures. Continue HBOT as ordered. Yes Electronic Signature(s) Signed: 11/13/2019 5:29:05 PM By: [name] MD Previous Signature: 11/13/2019 4:45:30 PM Version By: [name] EMT/HBOT Entered By: [name] on 11/13/2019 17:26:29 -------------------------------------------------------------------------------- HBO Safety Checklist Details Patient Name: Date of Service: [name], [name] 11/13/2019 8:00 AM Medical Record [id number] Patient Account Number: [id number] Date of Birth/Sex: Treating RN: 13-Jun-1946 (73 y.o. [name]) [name] Primary Care Cason Luffman: [name] Other Clinician: [name] Referring Memorie Yokoyama: Treating [name]/Extender:[name], [name], [name] in Treatment: 5 HBO Safety Checklist Items Safety Checklist Consent Form Signed Patient voided / foley secured and emptied When did you last eato n/a Last dose of injectable or oral agent n/a NA Ostomy pouch emptied and vented if applicable NA All implantable devices assessed, documented and approved NA Intravenous access site secured and place Valuables secured Linens and cotton and cotton/polyester blend (less than 51% polyester) Personal oil-based products / skin lotions / body lotions removed NA Wigs or hairpieces removed NA Smoking or tobacco materials removed Books / newspapers / magazines / loose paper removed Cologne, aftershave, perfume and deodorant removed Jewelry removed (may wrap wedding band) NA Make-up removed Hair care products removed NA Battery operated devices (external) removed NA Heating patches and chemical warmers removed NA Titanium eyewear removed NA Nail polish cured greater than 10 hours NA Casting material cured greater than 10 hours NA Hearing aids removed NA Loose dentures or partials removed NA Prosthetics have been removed Patient demonstrates correct use  of air break device (if applicable) Patient concerns have been addressed Patient grounding bracelet on and cord attached to chamber Specifics for Inpatients (complete in addition to above) Medication sheet sent with patient Intravenous medications needed or due during therapy sent with patient Drainage tubes (e.g. nasogastric tube or chest tube secured and vented) Endotracheal or Tracheotomy tube secured Cuff deflated of air and inflated with saline  Airway suctioned Electronic Signature(s) Signed: 11/13/2019 8:28:11 AM By: [name] EMT/HBOT Entered By: [name] on 11/13/2019 08:28:11

## 2019-11-13 NOTE — Progress Notes (Signed)
TILMON, BOSARGE ([id number]) Visit Report for 11/12/2019 SuperBill Details Patient Name: Date of Service: [name], [name] 11/12/2019 Medical Record [id number] Patient Account Number: [id number] Date of Birth/Sex: Treating RN: [birth year] (74 y.o. 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

## 2019-11-14 ENCOUNTER — Ambulatory Visit (HOSPITAL_BASED_OUTPATIENT_CLINIC_OR_DEPARTMENT_OTHER): Payer: Medicare Other | Admitting: Physician Assistant

## 2019-11-14 ENCOUNTER — Other Ambulatory Visit: Payer: Self-pay

## 2019-11-14 ENCOUNTER — Encounter (HOSPITAL_BASED_OUTPATIENT_CLINIC_OR_DEPARTMENT_OTHER): Payer: Self-pay

## 2019-11-14 ENCOUNTER — Encounter (HOSPITAL_BASED_OUTPATIENT_CLINIC_OR_DEPARTMENT_OTHER): Payer: Medicare Other | Admitting: Physician Assistant

## 2019-11-15 ENCOUNTER — Encounter (HOSPITAL_BASED_OUTPATIENT_CLINIC_OR_DEPARTMENT_OTHER): Payer: Medicare Other | Admitting: Internal Medicine

## 2019-11-15 DIAGNOSIS — K520 Gastroenteritis and colitis due to radiation: Secondary | ICD-10-CM | POA: Diagnosis not present

## 2019-11-15 NOTE — Progress Notes (Addendum)
[name] ([name]) Visit Report for 11/15/2019 HBO Details Patient Name: Date of Service: [name], [name] 11/15/2019 8:00 AM Medical Record [id number] Patient Account Number: [id number] Date of Birth/Sex: Treating RN: 14-Mar-1946 (74 y.o. [name], [name] Primary Care [name]: [name] Other Clinician: Mikeal [name] Referring [name]: Treating [name]/Extender:[name], [name] in Treatment: 5 HBO Treatment Course Details Treatment Course Number: 1 Ordering [name]: [name] Total Treatments Ordered: 40 HBO Treatment Start Date: 10/15/2019 HBO Indication: Late Effect of Radiation HBO Treatment Details Treatment Number: 22 Patient Type: Outpatient Chamber Type: Monoplace Chamber Serial #: [id number] Treatment Protocol: 2.5 ATA with 90 minutes oxygen, with two 5 minute air breaks Treatment Details Compression Rate Down: 2.0 psi / minute De-Compression Rate Up: 2.0 psi / minute Air breaks and CompressTx Pressure breathing periods DecompressDecompress Begins Reached (leave unused spaces Begins Ends blank) Chamber Pressure (ATA)1 2.5 2.5 2.5 2.5 2.5 --2.5 1 Clock Time (24 hr) 08:12 08:24 [id number] 10:16 Treatment Length: 124 (minutes) Treatment Segments: 4 Vital Signs Capillary Blood Glucose Reference Range: 80 - 120 mg / dl HBO Diabetic Blood Glucose Intervention Range: <131 mg/dl or >249 mg/dl Time Vitals Blood Respiratory Capillary Blood Glucose Pulse Action Type: Pulse: Temperature: Taken: Pressure: Rate: Glucose (mg/dl): Meter #: Oximetry (%) Taken: Pre 08:05 135/68 59 14 98.3 Post 10:18 175/83 58 16 98 Treatment Response Treatment Toleration: Well Treatment Completion Treatment Completed without Adverse Event Status: [name] Notes no concerns with treatment given. Patient was also seen for HBO evaluation Physician HBO Attestation: I certify that I supervised this HBO treatment in accordance with  Medicare guidelines. A trained Yes Yes emergency response team is readily available per hospital policies and procedures. Continue HBOT as ordered. Yes Electronic Signature(s) Signed: 11/15/2019 7:02:26 PM By: [name] Ham MD Previous Signature: 11/15/2019 4:26:43 PM Version By: [name] EMT/HBOT Entered By: [name] on 11/15/2019 18:58:25 -------------------------------------------------------------------------------- HBO Safety Checklist Details Patient Name: Date of Service: [name], [name] 11/15/2019 8:00 AM Medical Record [id number] Patient Account Number: [id number] Date of Birth/Sex: Treating RN: 1945-11-07 (74 y.o. [name], [name] Primary Care Angelgabriel Willmore: [name] Other Clinician: Mikeal [name] Referring Dellamae Rosamilia: Treating Osby Sweetin/Extender:[name], [name] in Treatment: 5 HBO Safety Checklist Items Safety Checklist Consent Form Signed Patient voided / foley secured and emptied When did you last eato n/a Last dose of injectable or oral agent n/a NA Ostomy pouch emptied and vented if applicable NA All implantable devices assessed, documented and approved NA Intravenous access site secured and place Valuables secured Linens and cotton and cotton/polyester blend (less than 51% polyester) Personal oil-based products / skin lotions / body lotions removed NA Wigs or hairpieces removed NA Smoking or tobacco materials removed Books / newspapers / magazines / loose paper removed Cologne, aftershave, perfume and deodorant removed Jewelry removed (may wrap wedding band) NA Make-up removed Hair care products removed NA Battery operated devices (external) removed NA Heating patches and chemical warmers removed NA Titanium eyewear removed NA Nail polish cured greater than 10 hours NA Casting material cured greater than 10 hours NA Hearing aids removed NA Loose dentures or partials removed NA Prosthetics have been removed Patient  demonstrates correct use of air break device (if applicable) Patient concerns have been addressed Patient grounding bracelet on and cord attached to chamber Specifics for Inpatients (complete in addition to above) Medication sheet sent with patient Intravenous medications needed or due during therapy sent with patient Drainage tubes (e.g. nasogastric tube or chest tube secured and vented) Endotracheal or Tracheotomy tube secured Cuff  deflated of air and inflated with saline Airway suctioned Electronic Signature(s) Signed: 11/15/2019 8:40:41 AM By: [name] EMT/HBOT Entered By: [name] on 11/15/2019 08:40:40

## 2019-11-15 NOTE — Progress Notes (Signed)
TYON, LIBERT ([id number]) Visit Report for 11/15/2019 Physician Orders Details Patient Name: Date of Service: [name], [name] 11/15/2019 11:00 AM Medical Record [id number] Patient Account Number: [id number] Date of Birth/Sex: Treating RN: 07/28/1946 (74 y.o. [name] Primary Care Provider: [name] Other Clinician: Referring Provider: Treating Provider/Extender:Samatha Anspach, [name], [name] in Treatment: 5 Verbal / Phone Orders: No Diagnosis Coding Follow-up Appointments Other: - Hyberbarics daily as scheduled. Hyperbaric Oxygen Therapy Other - Continue HBO as scheduled. Electronic Signature(s) Signed: 11/15/2019 4:55:35 PM By: Deon Pilling Signed: 11/15/2019 7:02:26 PM By: [name] MD Entered By: Deon Pilling on 11/15/2019 10:31:36 -------------------------------------------------------------------------------- Problem List Details Patient Name: Date of Service: [name], [name] 11/15/2019 11:00 AM Medical Record [id number] Patient Account Number: [id number] Date of Birth/Sex: Treating RN: Aug 24, 1946 (74 y.o. 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 [name], [name] Primary Care Provider: [name] Other Clinician: Referring Provider: Treating Provider/Extender:Choya Tornow, [name], [name] in Treatment: 5 Subjective Objective Constitutional Vitals Time Taken: 10:30 AM, Height: 72 in, Weight: 240 lbs, BMI: 32.5, Temperature: 98.3 F, Pulse: 58 bpm, Respiratory Rate: 16 breaths/min, Blood Pressure: 175/83 mmHg. Assessment Active Problems ICD-10 Irradiation cystitis with hematuria Encounter for antineoplastic radiation therapy Personal history of malignant neoplasm of prostate Plan Follow-up Appointments: Other: - Hyberbarics daily as scheduled. Hyperbaric Oxygen Therapy: Other - Continue HBO as scheduled. 1. Patient is undergoing treatment with hyperbaric oxygen currently on treatment 22 out of 40. He is tolerating this well except for 1 day where he had some nasal congestion. 2. He has not had any visible bleeding or evidence of urinary obstruction. He does have urinary frequency urgency and incontinence but this is not changed. 3. The patient seems to be getting good benefit from hyperbaric oxygen. He is satisfied with his recovery from the gross hematuria. Electronic Signature(s) Signed: 11/15/2019 7:02:26 PM By: [name] MD Entered By: [name] on 11/15/2019 12:45:14 -------------------------------------------------------------------------------- SuperBill Details Patient Name: Date of Service: [name], [name] 11/15/2019 Medical Record [id number] Patient Account Number: [id number] Date of Birth/Sex: Treating RN: June 21, 1946 (74 y.o. 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

## 2019-11-15 NOTE — Progress Notes (Signed)
[name] ([id number]) Visit Report for 11/15/2019 Arrival Information Details Patient Name: Date of Service: [name] 11/15/2019 8:00 AM Medical Record [id number] Patient Account Number: [id number] Date of Birth/Sex: Treating RN: 1945-10-09 (74 y.o. [name], [name] Primary Care Lovett Coffin: [name] Other Clinician: [name] Referring Myasia Sinatra: Treating Bomani Oommen/Extender:[name], [name], [name] in Treatment: 5 Visit Information History Since Last Visit Added or deleted any medications: No Patient Arrived: Ambulatory Any new allergies or adverse reactions: No Arrival Time: 08:00 Had a fall or experienced change in No Accompanied By: self activities of daily living that may affect Transfer Assistance: None risk of falls: Patient Identification Verified: Yes Signs or symptoms of abuse/neglect since last No Secondary Verification Process Yes visito Completed: Hospitalized since last visit: No Patient Requires Transmission-Based No Implantable device outside of the clinic excluding No Precautions: cellular tissue based products placed in the center Patient Has Alerts: No since last visit: Pain Present Now: No Electronic Signature(s) Signed: 11/15/2019 4:26:43 PM By: [name] EMT/HBOT Entered By: [name] on 11/15/2019 08:39:52 -------------------------------------------------------------------------------- Encounter Discharge Information Details Patient Name: Date of Service: [name] 11/15/2019 8:00 AM Medical Record [id number] Patient Account Number: [id number] Date of Birth/Sex: Treating RN: 01-Oct-1945 (74 y.o. [name] Primary Care Kaidence Callaway: [name] Other Clinician: [name] Referring Heber Hoog: Treating Marrion Accomando/Extender:[name], [name], [name] in Treatment: 5 Encounter Discharge Information Items Discharge Condition: Stable Ambulatory Status: Ambulatory Discharge  Destination: Home Transportation: Private Auto Accompanied By: self Schedule Follow-up Appointment: Yes Clinical Summary of Care: Patient Declined Electronic Signature(s) Signed: 11/15/2019 4:26:43 PM By: [name] EMT/HBOT Entered By: [name] on 11/15/2019 11:21:57 -------------------------------------------------------------------------------- Patient/Caregiver Education Details Patient Name: Date of Service: [name] 3/18/2021andnbsp8:00 AM Medical Record [id number] Patient Account Number: [id number] Date of Birth/Gender: Treating RN: [birth year] (73 y.o. [name] Primary Care Physician: [name] Other Clinician: [name] Referring Physician: Treating Physician/Extender:[name], [name], [name] in Treatment: 5 Education Assessment Education Provided To: Patient Education Topics Provided Hyperbaric Oxygenation: Methods: Explain/Verbal Responses: State content correctly Electronic Signature(s) Signed: 11/15/2019 4:26:43 PM By: [name] EMT/HBOT Entered By: [name] on 11/15/2019 11:21:43 -------------------------------------------------------------------------------- Vitals Details Patient Name: Date of Service: [name] 11/15/2019 8:00 AM Medical Record [id number] Patient Account Number: [id number] Date of Birth/Sex: Treating RN: 1945/10/22 (74 y.o. [name], [name] Primary Care Vennie Salsbury: [name] Other Clinician: [name] Referring Zeva Leber: Treating Nicasio Barlowe/Extender:[name], [name], [name] in Treatment: 5 Vital Signs Time Taken: 08:05 Temperature (F): 98.3 Height (in): 72 Pulse (bpm): 59 Weight (lbs): 240 Respiratory Rate (breaths/min): 14 Body Mass Index (BMI): 32.5 Blood Pressure (mmHg): 135/68 Reference Range: 80 - 120 mg / dl Electronic Signature(s) Signed: 11/15/2019 4:26:43 PM By: [name] EMT/HBOT Entered By: [name] on 11/15/2019  08:40:06

## 2019-11-15 NOTE — Progress Notes (Signed)
HICKS, KRIZMAN ([id number]) Visit Report for 11/15/2019 Arrival Information Details Patient Name: Date of Service: [name], [name] 11/15/2019 11:00 AM Medical Record [id number] Patient Account Number: [id number] Date of Birth/Sex: Treating RN: 03-29-1946 (74 y.o. [name], [name] Primary Care Karalynn Cottone: [name] Other Clinician: Referring [name]: Treating [name]/Extender:[name], [name]: 5 Visit Information History Since Last Visit Added or deleted any medications: No Patient Arrived: Ambulatory Any new allergies or adverse reactions: No Arrival Time: 10:29 Had a fall or experienced change in No Accompanied By: self activities of daily living that may affect Transfer Assistance: None risk of falls: Patient Identification Verified: Yes Signs or symptoms of abuse/neglect since last No Secondary Verification Process Yes visito Completed: Hospitalized since last visit: No Patient Requires Transmission-Based No Implantable device outside of the clinic excluding No Precautions: cellular tissue based products placed in the center Patient Has Alerts: No since last visit: Pain Present Now: No Electronic Signature(s) Signed: 11/15/2019 4:55:35 PM By: [name] Entered By: [name] on 11/15/2019 10:30:11 -------------------------------------------------------------------------------- Clinic Level of Care Assessment Details Patient Name: Date of Service: [name], [name] 11/15/2019 11:00 AM Medical Record [id number] Patient Account Number: [id number] Date of Birth/Sex: Treating RN: [birth year] (74 y.o. [name], [name] Primary Care Jahquez Steffler: [name] Other Clinician: Referring Icie Kuznicki: Treating [name]/Extender:[name], [name]: 5 Clinic Level of Care Assessment Items TOOL 4 Quantity Score X - Use when only an EandM is performed on FOLLOW-UP visit 1 0 ASSESSMENTS - Nursing  Assessment / Reassessment X - Reassessment of Co-morbidities (includes updates in patient status) 1 10 X - Reassessment of Adherence to [name] Plan 1 5 ASSESSMENTS - Wound and Skin Assessment / Reassessment []  - Simple Wound Assessment / Reassessment - one wound 0 []  - Complex Wound Assessment / Reassessment - multiple wounds 0 []  - Dermatologic / Skin Assessment (not related to wound area) 0 ASSESSMENTS - Focused Assessment []  - Circumferential Edema Measurements - multi extremities 0 X - Nutritional Assessment / Counseling / Intervention 1 10 []  - Lower Extremity Assessment (monofilament, tuning fork, pulses) 0 []  - Peripheral Arterial Disease Assessment (using hand held doppler) 0 ASSESSMENTS - Ostomy and/or Continence Assessment and Care []  - Incontinence Assessment and Management 0 []  - Ostomy Care Assessment and Management (repouching, etc.) 0 PROCESS - Coordination of Care X - Simple Patient / Family Education for ongoing care 1 15 []  - Complex (extensive) Patient / Family Education for ongoing care 0 []  - Staff obtains Programmer, systems, Records, Test Results / Process Orders 0 []  - Staff telephones HHA, Nursing Homes / Clarify orders / etc 0 []  - Routine Transfer to another Facility (non-emergent condition) 0 []  - Routine Hospital Admission (non-emergent condition) 0 []  - New Admissions / Biomedical engineer / Ordering NPWT, Apligraf, etc. 0 []  - Emergency Hospital Admission (emergent condition) 0 X - Simple Discharge Coordination 1 10 []  - Complex (extensive) Discharge Coordination 0 PROCESS - Special Needs []  - Pediatric / Minor Patient Management 0 []  - Isolation Patient Management 0 []  - Hearing / Language / Visual special needs 0 []  - Assessment of Community assistance (transportation, D/C planning, etc.) 0 []  - Additional assistance / Altered mentation 0 []  - Support Surface(s) Assessment (bed, cushion, seat, etc.) 0 INTERVENTIONS - Wound Cleansing / Measurement []  -  Simple Wound Cleansing - one wound 0 []  - Complex Wound Cleansing - multiple wounds 0 []  - Wound Imaging (photographs - any number of wounds) 0 []  - Wound Tracing (instead of photographs)  0 []  - Simple Wound Measurement - one wound 0 []  - Complex Wound Measurement - multiple wounds 0 INTERVENTIONS - Wound Dressings []  - Small Wound Dressing one or multiple wounds 0 []  - Medium Wound Dressing one or multiple wounds 0 []  - Large Wound Dressing one or multiple wounds 0 []  - Application of Medications - topical 0 []  - Application of Medications - injection 0 INTERVENTIONS - Miscellaneous []  - External ear exam 0 []  - Specimen Collection (cultures, biopsies, blood, body fluids, etc.) 0 []  - Specimen(s) / Culture(s) sent or taken to Lab for analysis 0 []  - Patient Transfer (multiple staff / Civil Service fast streamer / Similar devices) 0 []  - Simple Staple / Suture removal (25 or less) 0 []  - Complex Staple / Suture removal (26 or more) 0 []  - Hypo / Hyperglycemic Management (close monitor of Blood Glucose) 0 []  - Ankle / Brachial Index (ABI) - do not check if billed separately 0 X - Vital Signs 1 5 Has the patient been seen at the hospital within the last three years: Yes Total Score: 55 Level Of Care: New/Established - Level 2 Electronic Signature(s) Signed: 11/15/2019 4:55:35 PM By: [name] Entered By: [name] on 11/15/2019 10:32:24 -------------------------------------------------------------------------------- Encounter Discharge Information Details Patient Name: Date of Service: [name], [name] 11/15/2019 11:00 AM Medical Record [id number] Patient Account Number: [id number] Date of Birth/Sex: Treating RN: 1946-03-31 (74 y.o. [name] Primary Care [name]: [name] Other Clinician: Referring [name]: Treating [name]/Extender:[name], [name]: 5 Encounter Discharge Information Items Discharge Condition: Stable Ambulatory  Status: Ambulatory Discharge Destination: Home Transportation: Private Auto Accompanied By: self Schedule Follow-up Appointment: Yes Clinical Summary of Care: Electronic Signature(s) Signed: 11/15/2019 4:55:35 PM By: [name] Entered By: [name] on 11/15/2019 10:32:57 -------------------------------------------------------------------------------- Lower Extremity Assessment Details Patient Name: Date of Service: [name], [name] 11/15/2019 11:00 AM Medical Record [id number] Patient Account Number: [id number] Date of Birth/Sex: Treating RN: March 27, 1946 (74 y.o. [name] Primary Care Chevy Virgo: [name] Other Clinician: Referring Delilah Mulgrew: Treating Laylynn Campanella/Extender:[name], [name]: 5 Electronic Signature(s) Signed: 11/15/2019 4:55:35 PM By: [name] Entered By: [name] on 11/15/2019 10:30:39 -------------------------------------------------------------------------------- [place] Details Patient Name: Date of Service: [name], [name] 11/15/2019 11:00 AM Medical Record [id number] Patient Account Number: [id number] Date of Birth/Sex: Treating RN: [birth year] (74 y.o. [name], [name] Primary Care Bartolo Montanye: [name] Other Clinician: Referring Illyana Schorsch: Treating Edmund Rick/Extender:[name], [name]: 5 Active Inactive HBO Nursing Diagnoses: Discomfort related to temperature and humidity changes inside hyperbaric chamber Potential for barotraumas to ears, sinuses, teeth, and lungs or cerebral gas embolism related to changes in atmospheric pressure inside hyperbaric oxygen chamber Potential for oxygen toxicity seizures related to delivery of 100% oxygen at an increased atmospheric pressure Goals: Patient will tolerate the hyperbaric oxygen therapy [name] Date Initiated: 10/05/2019 Target Resolution Date: 11/30/2019 Goal Status: Active Patient/caregiver  will verbalize understanding of HBO goals, rationale, procedures and potential hazards Date Initiated: 10/05/2019 Target Resolution Date: 11/30/2019 Goal Status: Active Interventions: Assess for signs and symptoms related to adverse events, including but not limited to confinement anxiety, pneumothorax, oxygen toxicity and baurotrauma Assess patient's knowledge and expectations regarding hyperbaric medicine and provide education related to the hyperbaric environment, goals of [name] and prevention of adverse events Notes: Electronic Signature(s) Signed: 11/15/2019 4:55:35 PM By: [name] Entered By: [name] on 11/15/2019 10:31:45 -------------------------------------------------------------------------------- Pain Assessment Details Patient Name: Date of Service: [name], [name] 11/15/2019 11:00 AM Medical Record [id number] Patient  Account Number: [id number] Date of Birth/Sex: Treating RN: September 22, 1945 (74 y.o. [name] Primary Care Crystie Yanko: [name] Other Clinician: Referring Myan Suit: Treating Gaelan Glennon/Extender:[name], [name]: 5 Active Problems Location of Pain Severity and Description of Pain Patient Has Paino No Site Locations Rate the pain. Current Pain Level: 0 Pain Management and Medication Current Pain Management: Medication: No Cold Application: No Rest: No Massage: No Activity: No T.E.N.S.: No Heat Application: No Leg drop or elevation: No Is the Current Pain Management Adequate: Adequate How does your wound impact your activities of daily livingo Sleep: No Bathing: No Appetite: No Relationship With Others: No Bladder Continence: No Emotions: No Bowel Continence: No Work: No Toileting: No Drive: No Dressing: No Hobbies: No Electronic Signature(s) Signed: 11/15/2019 4:55:35 PM By: [name] Entered By: [name] on 11/15/2019  10:30:36 -------------------------------------------------------------------------------- Patient/Caregiver Education Details Patient Name: Date of Service: [name], [name] 3/18/2021andnbsp11:00 AM Medical Record [id number] Patient Account Number: [id number] Date of Birth/Gender: Treating RN: September 24, 1945 (74 y.o. [name] Primary Care Physician: [name] Other Clinician: Referring Physician: Treating Physician/Extender:[name], [name]: 5 Education Assessment Education Provided To: Patient Education Topics Provided Wound/Skin Impairment: Handouts: Skin Care Do's and Dont's Methods: Explain/Verbal Responses: Reinforcements needed Electronic Signature(s) Signed: 11/15/2019 4:55:35 PM By: [name] Entered By: [name] on 11/15/2019 10:32:00 -------------------------------------------------------------------------------- Vitals Details Patient Name: Date of Service: [name], [name] 11/15/2019 11:00 AM Medical Record [id number] Patient Account Number: [id number] Date of Birth/Sex: Treating RN: [birth year] (74 y.o. [name], [name] Primary Care Harjit Leider: [name] Other Clinician: Referring Luccia Reinheimer: Treating Kaija Kovacevic/Extender:[name], [name]: 5 Vital Signs Time Taken: 10:30 Temperature (F): 98.3 Height (in): 72 Pulse (bpm): 58 Weight (lbs): 240 Respiratory Rate (breaths/min): 16 Body Mass Index (BMI): 32.5 Blood Pressure (mmHg): 175/83 Reference Range: 80 - 120 mg / dl Electronic Signature(s) Signed: 11/15/2019 4:55:35 PM By: [name] Entered By: [name] on 11/15/2019 10:30:28

## 2019-11-15 NOTE — Progress Notes (Signed)
NASAIR, IWEN ([id number]) Visit Report for 11/15/2019 SuperBill Details Patient Name: Date of Service: XAYVION, BERQUIST 11/15/2019 Medical Record [id number] Patient Account Number: [id number] Date of Birth/Sex: Treating RN: 1946-02-17 (74 y.o. 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.46 Personal history of malignant neoplasm of prostate Facility Procedures The patient participates with Medicare or their insurance follows the Medicare Facility Guidelines CPT4 Code Description Modifier Quantity [id number] G0277-(Facility Use Only) HBOT, full body chamber, 71min 4 Physician Procedures CPT4 Code Description Modifier Quantity [id number] - WC PHYS HYPERBARIC OXYGEN THERAPY 1 ICD-10 Diagnosis Description N30.41 Irradiation cystitis with hematuria Electronic Signature(s) Signed: 11/15/2019 4:26:43 PM By: [name] EMT/HBOT Signed: 11/15/2019 7:02:26 PM By: [name] MD Entered By: [name] on 11/15/2019 11:21:26

## 2019-11-16 ENCOUNTER — Encounter (HOSPITAL_BASED_OUTPATIENT_CLINIC_OR_DEPARTMENT_OTHER): Payer: Medicare Other | Admitting: Internal Medicine

## 2019-11-16 ENCOUNTER — Other Ambulatory Visit: Payer: Self-pay

## 2019-11-16 DIAGNOSIS — K520 Gastroenteritis and colitis due to radiation: Secondary | ICD-10-CM | POA: Diagnosis not present

## 2019-11-16 NOTE — Progress Notes (Signed)
KASSON, MULHERN ([id number]) Visit Report for 11/16/2019 Arrival Information Details Patient Name: Date of Service: STINSON, RASER 11/16/2019 8:00 AM Medical Record [id number] Patient Account Number: [id number] Date of Birth/Sex: Treating RN: Dec 18, 1945 (74 y.o. [name] Primary Care Angelys Yetman: [name] Other Clinician: [name] Referring Serria Sloma: Treating Mariajose Mow/Extender:[name], [name], [name] in Treatment: 6 Visit Information History Since Last Visit Added or deleted any medications: No Patient Arrived: Ambulatory Any new allergies or adverse reactions: No Arrival Time: 07:45 Had a fall or experienced change in No Accompanied By: self activities of daily living that may affect Transfer Assistance: None risk of falls: Patient Identification Verified: Yes Signs or symptoms of abuse/neglect since last No Secondary Verification Process Yes visito Completed: Hospitalized since last visit: No Patient Requires Transmission-Based No Implantable device outside of the clinic excluding No Precautions: cellular tissue based products placed in the center Patient Has Alerts: No since last visit: Pain Present Now: No Electronic Signature(s) Signed: 11/16/2019 4:38:27 PM By: [name] EMT/HBOT Entered By: [name] on 11/16/2019 09:10:43 -------------------------------------------------------------------------------- Encounter Discharge Information Details Patient Name: Date of Service: AADYN, MCKINSTRY 11/16/2019 8:00 AM Medical Record [id number] Patient Account Number: [id number] Date of Birth/Sex: Treating RN: 07/24/1946 (74 y.o. [name] Primary Care Christoffer Currier: [name] Other Clinician: [name] Referring Cyncere Ruhe: Treating Elber Galyean/Extender:[name], [name], [name] in Treatment: 6 Encounter Discharge Information Items Discharge Condition: Stable Ambulatory Status: Ambulatory Discharge  Destination: Home Transportation: Private Auto Accompanied By: self Schedule Follow-up Appointment: Yes Clinical Summary of Care: Patient Declined Electronic Signature(s) Signed: 11/16/2019 4:38:27 PM By: [name] EMT/HBOT Entered By: [name] on 11/16/2019 14:04:17 -------------------------------------------------------------------------------- Patient/Caregiver Education Details Patient Name: Date of Service: ELLISON, SACHDEVA 3/19/2021andnbsp8:00 AM Medical Record [id number] Patient Account Number: [id number] Date of Birth/Gender: Treating RN: 07-23-1946 (74 y.o. [name] Primary Care Physician: [name] Other Clinician: [name] Referring Physician: Treating Physician/Extender:[name], [name], [name] in Treatment: 6 Education Assessment Education Provided To: Patient Education Topics Provided Hyperbaric Oxygenation: Methods: Explain/Verbal Responses: State content correctly Electronic Signature(s) Signed: 11/16/2019 4:38:27 PM By: [name] EMT/HBOT Entered By: [name] on 11/16/2019 14:04:06 -------------------------------------------------------------------------------- Vitals Details Patient Name: Date of Service: BRITON, TIFFIN 11/16/2019 8:00 AM Medical Record [id number] Patient Account Number: [id number] Date of Birth/Sex: Treating RN: 1946/02/12 (74 y.o. [name] Primary Care Briyanna Billingham: [name] Other Clinician: [name] Referring Calianna Kim: Treating Tryphena Perkovich/Extender:[name], [name], [name] in Treatment: 6 Vital Signs Time Taken: 07:50 Temperature (F): 98.4 Height (in): 72 Pulse (bpm): 53 Weight (lbs): 240 Respiratory Rate (breaths/min): 14 Body Mass Index (BMI): 32.5 Blood Pressure (mmHg): 126/64 Reference Range: 80 - 120 mg / dl Electronic Signature(s) Signed: 11/16/2019 4:38:27 PM By: [name] EMT/HBOT Entered By: [name] on 11/16/2019  09:11:00

## 2019-11-16 NOTE — Progress Notes (Signed)
MANNING, WILBANKS ([id number]) Visit Report for 11/16/2019 SuperBill Details Patient Name: Date of Service: [name], [name] 11/16/2019 Medical Record [id number] Patient Account Number: [id number] Date of Birth/Sex: Treating RN: Jul 03, 1946 (74 y.o. 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 Encounter for antineoplastic radiation therapy Z85.46 Personal history of malignant neoplasm of prostate Facility Procedures The patient participates with Medicare or their insurance follows the Medicare Facility Guidelines CPT4 Code Description Modifier Quantity [id number] G0277-(Facility Use Only) HBOT, full body chamber, 65min 4 Physician Procedures CPT4 Code Description Modifier Quantity [id number] - WC PHYS HYPERBARIC OXYGEN THERAPY 1 ICD-10 Diagnosis Description N30.41 Irradiation cystitis with hematuria Electronic Signature(s) Signed: 11/16/2019 4:38:27 PM By: [name] EMT/HBOT Signed: 11/16/2019 5:09:02 PM By: [name] MD Entered By: [name] on 11/16/2019 14:03:53

## 2019-11-16 NOTE — Progress Notes (Addendum)
BAHE, SANKEY ([id number]) Visit Report for 11/16/2019 HBO Details Patient Name: Date of Service: [name], [name] 11/16/2019 8:00 AM Medical Record [id number] Patient Account Number: [id number] Date of Birth/Sex: Treating RN: [birth year] (74 y.o. [name] Primary Care [name]: [name] Other Clinician: Mikeal [name] Referring [name]: Treating [name]/Extender:[name], [name], [name] in Treatment: 6 HBO Treatment Course Details Treatment Course Number: 1 Ordering Analee Montee: [name] Total Treatments Ordered: 40 HBO Treatment Start Date: 10/15/2019 HBO Indication: Late Effect of Radiation HBO Treatment Details Treatment Number: 23 Patient Type: Outpatient Chamber Type: Monoplace Chamber Serial #: [id number] Treatment Protocol: 2.5 ATA with 90 minutes oxygen, with two 5 minute air breaks Treatment Details Compression Rate Down: 2.0 psi / minute De-Compression Rate Up: 2.0 psi / minute Air breaks and CompressTx Pressure breathing periods DecompressDecompress Begins Reached (leave unused spaces Begins Ends blank) Chamber Pressure (ATA)1 2.5 2.5 2.5 2.5 2.5 --2.5 1 Clock Time (24 hr) 08:05 08:17 [id number] 10:09 Treatment Length: 124 (minutes) Treatment Segments: 4 Vital Signs Capillary Blood Glucose Reference Range: 80 - 120 mg / dl HBO Diabetic Blood Glucose Intervention Range: <131 mg/dl or >249 mg/dl Time Vitals Blood Respiratory Capillary Blood Glucose Pulse Action Type: Pulse: Temperature: Taken: Pressure: Rate: Glucose (mg/dl): Meter #: Oximetry (%) Taken: Pre 07:50 126/64 53 14 98.4 Post 10:11 164/80 56 16 98 Treatment Response Treatment Toleration: Well Treatment Completion Treatment Completed without Adverse Event Status: [name] Notes No concerns with treatment given Physician HBO Attestation: I certify that I supervised this HBO treatment in accordance with Medicare guidelines. A  trained Yes Yes emergency response team is readily available per hospital policies and procedures. Continue HBOT as ordered. Yes Electronic Signature(s) Signed: 11/16/2019 5:09:02 PM By: [name] Ham MD Previous Signature: 11/16/2019 4:38:27 PM Version By: [name] EMT/HBOT Entered By: [name] on 11/16/2019 16:39:11 -------------------------------------------------------------------------------- HBO Safety Checklist Details Patient Name: Date of Service: [name], [name] 11/16/2019 8:00 AM Medical Record [id number] Patient Account Number: [id number] Date of Birth/Sex: Treating RN: [birth year] (74 y.o. [name] Primary Care Byers Grosser: [name] Other Clinician: Mikeal [name] Referring [name]: Treating [name]/Extender:[name], [name], [name] in Treatment: 6 HBO Safety Checklist Items Safety Checklist Consent Form Signed Patient voided / foley secured and emptied When did you last eato n/a Last dose of injectable or oral agent n/a NA Ostomy pouch emptied and vented if applicable NA All implantable devices assessed, documented and approved NA Intravenous access site secured and place Valuables secured Linens and cotton and cotton/polyester blend (less than 51% polyester) Personal oil-based products / skin lotions / body lotions removed NA Wigs or hairpieces removed NA Smoking or tobacco materials removed Books / newspapers / magazines / loose paper removed Cologne, aftershave, perfume and deodorant removed Jewelry removed (may wrap wedding band) NA Make-up removed Hair care products removed NA Battery operated devices (external) removed NA Heating patches and chemical warmers removed NA Titanium eyewear removed NA Nail polish cured greater than 10 hours NA Casting material cured greater than 10 hours NA Hearing aids removed NA Loose dentures or partials removed NA Prosthetics have been removed Patient demonstrates correct  use of air break device (if applicable) Patient concerns have been addressed Patient grounding bracelet on and cord attached to chamber Specifics for Inpatients (complete in addition to above) Medication sheet sent with patient Intravenous medications needed or due during therapy sent with patient Drainage tubes (e.g. nasogastric tube or chest tube secured and vented) Endotracheal or Tracheotomy tube secured Cuff deflated of air and inflated with saline  Airway suctioned Electronic Signature(s) Signed: 11/16/2019 9:12:13 AM By: [name] EMT/HBOT Entered By: [name] on 11/16/2019 09:12:13

## 2019-11-19 ENCOUNTER — Encounter (HOSPITAL_BASED_OUTPATIENT_CLINIC_OR_DEPARTMENT_OTHER): Payer: Medicare Other | Admitting: Internal Medicine

## 2019-11-19 ENCOUNTER — Other Ambulatory Visit: Payer: Self-pay

## 2019-11-19 DIAGNOSIS — K520 Gastroenteritis and colitis due to radiation: Secondary | ICD-10-CM | POA: Diagnosis not present

## 2019-11-19 NOTE — Progress Notes (Addendum)
[name], [name] ([id number]) Visit Report for 11/19/2019 HBO Details Patient Name: Date of Service: [name], [name] 11/19/2019 8:00 AM Medical Record [id number] Patient Account Number: [id number] Date of Birth/Sex: Treating RN: 09-19-1945 (74 y.o. [name] Primary Care Jenene Kauffmann: [name] Other Clinician: [name] Referring Anah Billard: Treating Jovonne Wilton/Extender:[name], [name], [name] in Treatment: 6 HBO Treatment Course Details Treatment Course Number: 1 Ordering Freddi Schrager: [name] Total Treatments Ordered: 40 HBO Treatment Start Date: 10/15/2019 HBO Indication: Late Effect of Radiation HBO Treatment Details Treatment Number: 24 Patient Type: Outpatient Chamber Type: Monoplace Chamber Serial #: [id number] Treatment Protocol: 2.5 ATA with 90 minutes oxygen, with two 5 minute air breaks Treatment Details Compression Rate Down: 2.0 psi / minute De-Compression Rate Up: 2.0 psi / minute Air breaks and CompressTx Pressure breathing periods DecompressDecompress Begins Reached (leave unused spaces Begins Ends blank) Chamber Pressure (ATA)1 2.5 2.5 2.5 2.5 2.5 --2.5 1 Clock Time (24 hr) 08:03 08:15 [id number]:4508:5009:2009:25--09:55 10:07 Treatment Length: 124 (minutes) Treatment Segments: 4 Vital Signs Capillary Blood Glucose Reference Range: 80 - 120 mg / dl HBO Diabetic Blood Glucose Intervention Range: <131 mg/dl or >249 mg/dl Time Vitals Blood Respiratory Capillary Blood Glucose Pulse Action Type: Pulse: Temperature: Taken: Pressure: Rate: Glucose (mg/dl): Meter #: Oximetry (%) Taken: Pre 07:55 139/65 54 16 98.2 Post 10:10 160/81 53 14 98.5 Treatment Response Treatment Toleration: Well Treatment Completion Treatment Completed without Adverse Event Status: Keiyana Stehr Notes No concerns with treatment given Physician HBO Attestation: I certify that I supervised this HBO treatment in accordance with Medicare guidelines. A  trained Yes Yes emergency response team is readily available per hospital policies and procedures. Continue HBOT as ordered. Yes Electronic Signature(s) Signed: 11/19/2019 5:34:39 PM By: [name] MD Previous Signature: 11/19/2019 4:17:21 PM Version By: [name] EMT/HBOT Entered By: [name] on 11/19/2019 17:32:34 -------------------------------------------------------------------------------- HBO Safety Checklist Details Patient Name: Date of Service: [name], [name] 11/19/2019 8:00 AM Medical Record [id number] Patient Account Number: [id number] Date of Birth/Sex: Treating RN: [birth year] (74 y.o. [name], [name] Primary Care Nichele Slawson: [name] Other Clinician: [name] Referring Roald Lukacs: Treating Rhoda Waldvogel/Extender:[name], [name], [name] in Treatment: 6 HBO Safety Checklist Items Safety Checklist Consent Form Signed Patient voided / foley secured and emptied When did you last eato n/a Last dose of injectable or oral agent n/a NA Ostomy pouch emptied and vented if applicable NA All implantable devices assessed, documented and approved NA Intravenous access site secured and place Valuables secured Linens and cotton and cotton/polyester blend (less than 51% polyester) Personal oil-based products / skin lotions / body lotions removed NA Wigs or hairpieces removed NA Smoking or tobacco materials removed Books / newspapers / magazines / loose paper removed Cologne, aftershave, perfume and deodorant removed Jewelry removed (may wrap wedding band) NA Make-up removed Hair care products removed NA Battery operated devices (external) removed NA Heating patches and chemical warmers removed NA Titanium eyewear removed NA Nail polish cured greater than 10 hours NA Casting material cured greater than 10 hours NA Hearing aids removed NA Loose dentures or partials removed NA Prosthetics have been removed Patient demonstrates correct  use of air break device (if applicable) Patient concerns have been addressed Patient grounding bracelet on and cord attached to chamber Specifics for Inpatients (complete in addition to above) Medication sheet sent with patient Intravenous medications needed or due during therapy sent with patient Drainage tubes (e.g. nasogastric tube or chest tube secured and vented) Endotracheal or Tracheotomy tube secured Cuff deflated of air and inflated with saline  Airway suctioned Electronic Signature(s) Signed: 11/19/2019 8:19:14 AM By: [name] EMT/HBOT Entered By: [name] on 11/19/2019 08:19:14

## 2019-11-19 NOTE — Progress Notes (Signed)
[name], [name] ([id number]) Visit Report for 11/19/2019 SuperBill Details Patient Name: Date of Service: [name], [name] 11/19/2019 Medical Record [id number] Patient Account Number: [id number] Date of Birth/Sex: Treating RN: [birth year] (74 y.o. 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 Encounter for antineoplastic radiation therapy Z85.46 Personal history of malignant neoplasm of prostate Facility Procedures The patient participates with Medicare or their insurance follows the Medicare Facility Guidelines CPT4 Code Description Modifier Quantity [id number] G0277-(Facility Use Only) HBOT, full body chamber, 16min 4 Physician Procedures CPT4 Code Description Modifier Quantity [id number] - WC PHYS HYPERBARIC OXYGEN THERAPY 1 ICD-10 Diagnosis Description N30.41 Irradiation cystitis with hematuria Electronic Signature(s) Signed: 11/19/2019 4:17:21 PM By: [name] EMT/HBOT Signed: 11/19/2019 5:34:39 PM By: [name] MD Entered By: [name] on 11/19/2019 12:02:40

## 2019-11-19 NOTE — Progress Notes (Signed)
DEMETRES, DARRAGH ([id number]) Visit Report for 11/19/2019 Arrival Information Details Patient Name: Date of Service: CALLIS, ADAMIC 11/19/2019 8:00 AM Medical Record [id number] Patient Account Number: [id number] Date of Birth/Sex: Treating RN: March 31, 1946 (74 y.o. [name], [name] Primary Care Sohail Capraro: [name] Other Clinician: [name] Referring Alyssah Algeo: Treating Kalliopi Coupland/Extender:[name], [name], [name] in Treatment: 6 Visit Information History Since Last Visit Added or deleted any medications: No Patient Arrived: Ambulatory Any new allergies or adverse reactions: No Arrival Time: 07:50 Had a fall or experienced change in No Accompanied By: self activities of daily living that may affect Transfer Assistance: None risk of falls: Patient Identification Verified: Yes Signs or symptoms of abuse/neglect since last No Secondary Verification Process Yes visito Completed: Hospitalized since last visit: No Patient Requires Transmission-Based No Implantable device outside of the clinic excluding No Precautions: cellular tissue based products placed in the center Patient Has Alerts: No since last visit: Pain Present Now: No Electronic Signature(s) Signed: 11/19/2019 4:17:21 PM By: [name] EMT/HBOT Entered By: [name] on 11/19/2019 08:18:03 -------------------------------------------------------------------------------- Encounter Discharge Information Details Patient Name: Date of Service: BEE, HEFEL 11/19/2019 8:00 AM Medical Record [id number] Patient Account Number: [id number] Date of Birth/Sex: Treating RN: 1946/06/09 (74 y.o. [name] Primary Care Quida Glasser: [name] Other Clinician: [name] Referring Sherian Valenza: Treating Agnieszka Newhouse/Extender:[name], [name], [name] in Treatment: 6 Encounter Discharge Information Items Discharge Condition: Stable Ambulatory Status: Ambulatory Discharge  Destination: Home Transportation: Private Auto Accompanied By: self Schedule Follow-up Appointment: Yes Clinical Summary of Care: Patient Declined Electronic Signature(s) Signed: 11/19/2019 4:17:21 PM By: [name] EMT/HBOT Entered By: [name] on 11/19/2019 12:03:09 -------------------------------------------------------------------------------- Patient/Caregiver Education Details Patient Name: Date of Service: RAWSON, RUPINSKI 3/22/2021andnbsp8:00 AM Medical Record [id number] Patient Account Number: [id number] Date of Birth/Gender: Treating RN: 11-12-1945 (73 y.o. [name] Primary Care Physician: [name] Other Clinician: [name] Referring Physician: Treating Physician/Extender:[name], [name], [name] in Treatment: 6 Education Assessment Education Provided To: Patient Education Topics Provided Hyperbaric Oxygenation: Methods: Explain/Verbal Responses: State content correctly Electronic Signature(s) Signed: 11/19/2019 4:17:21 PM By: [name] EMT/HBOT Entered By: [name] on 11/19/2019 12:02:52 -------------------------------------------------------------------------------- Vitals Details Patient Name: Date of Service: ELAINE, BASORE 11/19/2019 8:00 AM Medical Record [id number] Patient Account Number: [id number] Date of Birth/Sex: Treating RN: [birth year] (73 y.o. [name] Primary Care Willaim Mode: [name] Other Clinician: [name] Referring Tyrus Wilms: Treating Zury Fazzino/Extender:[name], [name], [name] in Treatment: 6 Vital Signs Time Taken: 07:55 Temperature (F): 98.2 Height (in): 72 Pulse (bpm): 54 Weight (lbs): 240 Respiratory Rate (breaths/min): 16 Body Mass Index (BMI): 32.5 Blood Pressure (mmHg): 139/65 Reference Range: 80 - 120 mg / dl Electronic Signature(s) Signed: 11/19/2019 4:17:21 PM By: [name] EMT/HBOT Entered By: [name] on 11/19/2019  08:18:33

## 2019-11-20 ENCOUNTER — Encounter (HOSPITAL_BASED_OUTPATIENT_CLINIC_OR_DEPARTMENT_OTHER): Payer: Medicare Other | Admitting: Internal Medicine

## 2019-11-20 DIAGNOSIS — K520 Gastroenteritis and colitis due to radiation: Secondary | ICD-10-CM | POA: Diagnosis not present

## 2019-11-20 NOTE — Progress Notes (Signed)
AMNER, KNOEDLER ([id number]) Visit Report for 11/20/2019 SuperBill Details Patient Name: Date of Service: [name], [name] 11/20/2019 Medical Record [id number] Patient Account Number: [id number] Date of Birth/Sex: Treating RN: 03-21-1946 (73 y.o. 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 Encounter for antineoplastic radiation therapy Z85.46 Personal history of malignant neoplasm of prostate Facility Procedures The patient participates with Medicare or their insurance follows the Medicare Facility Guidelines CPT4 Code Description Modifier Quantity [id number] G0277-(Facility Use Only) HBOT, full body chamber, 88min 4 Physician Procedures CPT4 Code Description Modifier Quantity [id number] - WC PHYS HYPERBARIC OXYGEN THERAPY 1 ICD-10 Diagnosis Description N30.41 Irradiation cystitis with hematuria Electronic Signature(s) Signed: 11/20/2019 3:59:58 PM By: [name] EMT/HBOT Signed: 11/20/2019 5:53:09 PM By: [name] MD Entered By: [name] on 11/20/2019 10:55:24

## 2019-11-20 NOTE — Progress Notes (Addendum)
[name] ([id number]) Visit Report for 11/20/2019 HBO Details Patient Name: Date of Service: [name] 11/20/2019 8:00 AM Medical Record [id number] Patient Account Number: [id number] Date of Birth/Sex: Treating RN: February 21, 1946 (73 y.o. [name]) [name] Primary Care Travontae Freiberger: [name] Other Clinician: [name] Referring Anyelin Mogle: Treating Kaiser Belluomini/Extender:[name], [name], [name] in Treatment: 6 HBO Treatment Course Details Treatment Course Number: 1 Ordering Jewelz Ricklefs: [name] Total Treatments Ordered: 40 HBO Treatment Start Date: 10/15/2019 HBO Indication: Late Effect of Radiation HBO Treatment Details Treatment Number: 25 Patient Type: Outpatient Chamber Type: Monoplace Chamber Serial #: [id number] Treatment Protocol: 2.5 ATA with 90 minutes oxygen, with two 5 minute air breaks Treatment Details Air breaks and CompressTx Pressure breathing periods DecompressDecompress Begins Reached (leave unused spaces Begins Ends blank) Chamber Pressure (ATA)1 2.5 2.5 2.5 2.5 2.5 --2.5 1 Clock Time (24 hr) 07:59 08:11 [id number] 10:03 Treatment Length: 124 (minutes) Treatment Segments: 4 Vital Signs Capillary Blood Glucose Reference Range: 80 - 120 mg / dl HBO Diabetic Blood Glucose Intervention Range: <131 mg/dl or >249 mg/dl Time Vitals Blood Respiratory Capillary Blood Glucose Pulse Action Type: Pulse: Temperature: Taken: Pressure: Rate: Glucose (mg/dl): Meter #: Oximetry (%) Taken: Pre 07:50 151/76 55 14 98.6 Post 10:05 144/82 51 16 98.1 Treatment Response Treatment Toleration: Well Treatment Completion Treatment Completed without Adverse Event Status: [name] Notes No concerns with treatment given Physician HBO Attestation: I certify that I supervised this HBO treatment in accordance with Medicare guidelines. A trained Yes emergency response team is readily available per hospital policies and  procedures. Continue HBOT as ordered. Yes Electronic Signature(s) Signed: 11/20/2019 5:53:09 PM By: [name] MD Previous Signature: 11/20/2019 3:59:58 PM Version By: [name] EMT/HBOT Entered By: [name] on 11/20/2019 17:51:05 -------------------------------------------------------------------------------- HBO Safety Checklist Details Patient Name: Date of Service: [name] 11/20/2019 8:00 AM Medical Record [id number] Patient Account Number: [id number] Date of Birth/Sex: Treating RN: 1946/02/24 (73 y.o. [name]) [name] Primary Care Dorn Hartshorne: [name] Other Clinician: [name] Referring Sarita Hakanson: Treating Lyndsie Wallman/Extender:[name], [name], [name] in Treatment: 6 HBO Safety Checklist Items Safety Checklist Consent Form Signed Patient voided / foley secured and emptied When did you last eato n/a Last dose of injectable or oral agent n/a NA Ostomy pouch emptied and vented if applicable NA All implantable devices assessed, documented and approved NA Intravenous access site secured and place Valuables secured Linens and cotton and cotton/polyester blend (less than 51% polyester) Personal oil-based products / skin lotions / body lotions removed NA Wigs or hairpieces removed NA Smoking or tobacco materials removed Books / newspapers / magazines / loose paper removed Cologne, aftershave, perfume and deodorant removed Jewelry removed (may wrap wedding band) NA Make-up removed Hair care products removed NA Battery operated devices (external) removed NA Heating patches and chemical warmers removed NA Titanium eyewear removed NA Nail polish cured greater than 10 hours NA Casting material cured greater than 10 hours NA Hearing aids removed NA Loose dentures or partials removed NA Prosthetics have been removed Patient demonstrates correct use of air break device (if applicable) Patient concerns have been addressed Patient  grounding bracelet on and cord attached to chamber Specifics for Inpatients (complete in addition to above) Medication sheet sent with patient Intravenous medications needed or due during therapy sent with patient Drainage tubes (e.g. nasogastric tube or chest tube secured and vented) Endotracheal or Tracheotomy tube secured Cuff deflated of air and inflated with saline Airway suctioned Electronic Signature(s) Signed: 11/20/2019 8:25:09 AM By: [name] EMT/HBOT Entered By: Ronnald Ramp,  Dedrick on 11/20/2019 08:25:09

## 2019-11-20 NOTE — Progress Notes (Signed)
[name], [name] ([id number]) Visit Report for 11/20/2019 Arrival Information Details Patient Name: Date of Service: [name], [name] 11/20/2019 8:00 AM Medical Record [id number] Patient Account Number: [id number] Date of Birth/Sex: Treating RN: 1945-11-09 (73 y.o. [name]) [name] Primary Care Boniface Goffe: [name] Other Clinician: [name] Referring Eliz Nigg: Treating Nahsir Venezia/Extender:[name], [name], [name] in Treatment: 6 Visit Information History Since Last Visit Added or deleted any medications: No Patient Arrived: Ambulatory Any new allergies or adverse reactions: No Arrival Time: 07:45 Had a fall or experienced change in No Accompanied By: self activities of daily living that may affect Transfer Assistance: None risk of falls: Patient Identification Verified: Yes Signs or symptoms of abuse/neglect since last No Secondary Verification Process Yes visito Completed: Hospitalized since last visit: No Patient Requires Transmission-Based No Implantable device outside of the clinic excluding No Precautions: cellular tissue based products placed in the center Patient Has Alerts: No since last visit: Pain Present Now: No Electronic Signature(s) Signed: 11/20/2019 3:59:58 PM By: [name] EMT/HBOT Entered By: [name] on 11/20/2019 08:23:28 -------------------------------------------------------------------------------- Encounter Discharge Information Details Patient Name: Date of Service: [name], [name] 11/20/2019 8:00 AM Medical Record [id number] Patient Account Number: [id number] Date of Birth/Sex: Treating RN: Jul 12, 1946 (73 y.o. [name]) [name] Primary Care Alaia Lordi: [name] Other Clinician: [name] Referring Ben Habermann: Treating Lashika Erker/Extender:[name], [name], [name] in Treatment: 6 Encounter Discharge Information Items Discharge Condition: Stable Ambulatory Status: Ambulatory Discharge  Destination: Home Transportation: Private Auto Accompanied By: self Schedule Follow-up Appointment: Yes Clinical Summary of Care: Patient Declined Electronic Signature(s) Signed: 11/20/2019 3:59:58 PM By: [name] EMT/HBOT Entered By: [name] on 11/20/2019 10:55:52 -------------------------------------------------------------------------------- Patient/Caregiver Education Details Patient Name: Date of Service: [name], [name] 3/23/2021andnbsp8:00 AM Medical Record [id number] Patient Account Number: [id number] Date of Birth/Gender: Treating RN: [birth year] (73 y.o. Oval Linsey Primary Care Physician: [name] Other Clinician: [name] Referring Physician: Treating Physician/Extender:[name], [name], [name] in Treatment: 6 Education Assessment Education Provided To: Patient Education Topics Provided Hyperbaric Oxygenation: Methods: Explain/Verbal Responses: State content correctly Electronic Signature(s) Signed: 11/20/2019 3:59:58 PM By: [name] EMT/HBOT Entered By: [name] on 11/20/2019 10:55:38 -------------------------------------------------------------------------------- Vitals Details Patient Name: Date of Service: [name] 11/20/2019 8:00 AM Medical Record [id number] Patient Account Number: [id number] Date of Birth/Sex: Treating RN: [birth year] (73 y.o. [name]) [name] Primary Care Dezzie Badilla: [name] Other Clinician: [name] Referring Fanchon Papania: Treating Denali Sharma/Extender:[name], [name], [name] in Treatment: 6 Vital Signs Time Taken: 07:50 Temperature (F): 98.6 Height (in): 72 Pulse (bpm): 55 Weight (lbs): 240 Respiratory Rate (breaths/min): 14 Body Mass Index (BMI): 32.5 Blood Pressure (mmHg): 151/76 Reference Range: 80 - 120 mg / dl Electronic Signature(s) Signed: 11/20/2019 3:59:58 PM By: [name] EMT/HBOT Entered By: [name] on 11/20/2019  08:24:33

## 2019-11-21 ENCOUNTER — Encounter (HOSPITAL_BASED_OUTPATIENT_CLINIC_OR_DEPARTMENT_OTHER): Payer: Medicare Other | Admitting: Physician Assistant

## 2019-11-21 ENCOUNTER — Other Ambulatory Visit: Payer: Self-pay

## 2019-11-21 DIAGNOSIS — K520 Gastroenteritis and colitis due to radiation: Secondary | ICD-10-CM | POA: Diagnosis not present

## 2019-11-21 NOTE — Progress Notes (Addendum)
[name], [name] ([id number]) Visit Report for 11/21/2019 HBO Details Patient Name: Date of Service: [name], [name] 11/21/2019 8:00 AM Medical Record [id number] Patient Account Number: [id number] Date of Birth/Sex: Treating RN: 1945/11/26 (74 y.o. M) Primary Care Dmarco Baldus: [name] Other Clinician: [name] Referring Truly Stankiewicz: Treating Lanyia Jewel/Extender:Stone III, [name], [name] in Treatment: 6 HBO Treatment Course Details Treatment Course Number: 1 Ordering Amarius Toto: [name] Total Treatments Ordered: 40 HBO Treatment Start Date: 10/15/2019 HBO Indication: Late Effect of Radiation HBO Treatment Details Treatment Number: 26 Patient Type: Outpatient Chamber Type: Monoplace Chamber Serial #: [id number] Treatment Protocol: 2.5 ATA with 90 minutes oxygen, with two 5 minute air breaks Treatment Details Compression Rate Down: 2.0 psi / minute De-Compression Rate Up: 2.0 psi / minute Air breaks and CompressTx Pressure breathing periods DecompressDecompress Begins Reached (leave unused spaces Begins Ends blank) Chamber Pressure (ATA)1 2.5 2.5 2.5 2.5 2.5 --2.5 1 Clock Time (24 hr) 08:04 08:16 [id number] 10:08 Treatment Length: 124 (minutes) Treatment Segments: 4 Vital Signs Capillary Blood Glucose Reference Range: 80 - 120 mg / dl HBO Diabetic Blood Glucose Intervention Range: <131 mg/dl or >249 mg/dl Time Vitals Blood Respiratory Capillary Blood Glucose Pulse Action Type: Pulse: Temperature: Taken: Pressure: Rate: Glucose (mg/dl): Meter #: Oximetry (%) Taken: Pre 07:55 136/77 56 18 98.1 Post 10:10 141/75 53 15 98.4 Treatment Response Treatment Toleration: Well Treatment Completion Treatment Completed without Adverse Event Status: Physician HBO Attestation: I certify that I supervised this HBO treatment in accordance with Medicare guidelines. A trained Yes emergency response team is readily available per hospital policies  and procedures. Continue HBOT as ordered. Yes Electronic Signature(s) Signed: 11/21/2019 4:52:43 PM By: Worthy Keeler PA-C Previous Signature: 11/21/2019 3:49:18 PM Version By: [name] EMT/HBOT Entered By: Worthy Keeler on 11/21/2019 16:52:42 -------------------------------------------------------------------------------- HBO Safety Checklist Details Patient Name: Date of Service: [name], [name] 11/21/2019 8:00 AM Medical Record [id number] Patient Account Number: [id number] Date of Birth/Sex: Treating RN: Jan 08, 1946 (74 y.o. M) Primary Care Jatorian Renault: [name] Other Clinician: [name] Referring Raivyn Kabler: Treating Kaile Bixler/Extender:Stone III, [name], [name] in Treatment: 6 HBO Safety Checklist Items Safety Checklist Consent Form Signed Patient voided / foley secured and emptied When did you last eato n/a Last dose of injectable or oral agent n/a NA Ostomy pouch emptied and vented if applicable NA All implantable devices assessed, documented and approved NA Intravenous access site secured and place Valuables secured Linens and cotton and cotton/polyester blend (less than 51% polyester) Personal oil-based products / skin lotions / body lotions removed NA Wigs or hairpieces removed NA Smoking or tobacco materials removed Books / newspapers / magazines / loose paper removed Cologne, aftershave, perfume and deodorant removed Jewelry removed (may wrap wedding band) NA Make-up removed Hair care products removed NA Battery operated devices (external) removed NA Heating patches and chemical warmers removed NA Titanium eyewear removed NA Nail polish cured greater than 10 hours NA Casting material cured greater than 10 hours NA Hearing aids removed NA Loose dentures or partials removed NA Prosthetics have been removed Patient demonstrates correct use of air break device (if applicable) Patient concerns have been addressed Patient grounding  bracelet on and cord attached to chamber Specifics for Inpatients (complete in addition to above) Medication sheet sent with patient Intravenous medications needed or due during therapy sent with patient Drainage tubes (e.g. nasogastric tube or chest tube secured and vented) Endotracheal or Tracheotomy tube secured Cuff deflated of air and inflated with saline Airway suctioned Electronic Signature(s) Signed: 11/21/2019 8:10:54 AM  By: [name] EMT/HBOT Entered By: [name] on 11/21/2019 08:10:53

## 2019-11-21 NOTE — Progress Notes (Signed)
MILO, CAPEL ([id number]) Visit Report for 11/21/2019 Arrival Information Details Patient Name: Date of Service: [name], [name] 11/21/2019 8:00 AM Medical Record [id number] Patient Account Number: [id number] Date of Birth/Sex: Treating RN: [birth year] (74 y.o. M) Primary Care Harbert Fitterer: [name] Other Clinician: [name] Referring Kahiau Schewe: Treating Raeya Merritts/Extender:Stone III, [name], [name] in Treatment: 6 Visit Information History Since Last Visit Added or deleted any medications: No Patient Arrived: Ambulatory Any new allergies or adverse reactions: No Arrival Time: 07:50 Had a fall or experienced change in No Accompanied By: self activities of daily living that may affect Transfer Assistance: None risk of falls: Patient Identification Verified: Yes Signs or symptoms of abuse/neglect since last No Secondary Verification Process Yes visito Completed: Hospitalized since last visit: No Patient Requires Transmission-Based No Implantable device outside of the clinic excluding No Precautions: cellular tissue based products placed in the center Patient Has Alerts: No since last visit: Pain Present Now: No Electronic Signature(s) Signed: 11/21/2019 3:49:18 PM By: [name] EMT/HBOT Entered By: [name] on 11/21/2019 08:09:55 -------------------------------------------------------------------------------- Encounter Discharge Information Details Patient Name: Date of Service: [name], [name] 11/21/2019 8:00 AM Medical Record [id number] Patient Account Number: [id number] Date of Birth/Sex: Treating RN: 1946/08/10 (74 y.o. M) Primary Care Marquiz Sotelo: [name] Other Clinician: [name] Referring Jazzmyne Rasnick: Treating Everlina Gotts/Extender:Stone III, [name], [name] in Treatment: 6 Encounter Discharge Information Items Discharge Condition: Stable Ambulatory Status: Ambulatory Discharge Destination: Home Transportation:  Private Auto Accompanied By: self Schedule Follow-up Appointment: Yes Clinical Summary of Care: Patient Declined Electronic Signature(s) Signed: 11/21/2019 3:49:18 PM By: [name] EMT/HBOT Entered By: [name] on 11/21/2019 11:25:07 -------------------------------------------------------------------------------- Patient/Caregiver Education Details Patient Name: Date of Service: [name], [name] 3/24/2021andnbsp8:00 AM Medical Record [id number] Patient Account Number: [id number] Date of Birth/Gender: Treating RN: [birth year] (73 y.o. M) Primary Care Physician: [name] Other Clinician: [name] Referring Physician: Treating Physician/Extender:Stone III, [name], [name] in Treatment: 6 Education Assessment Education Provided To: Patient Education Topics Provided Hyperbaric Oxygenation: Methods: Explain/Verbal Responses: State content correctly Electronic Signature(s) Signed: 11/21/2019 3:49:18 PM By: [name] EMT/HBOT Entered By: [name] on 11/21/2019 11:24:53 -------------------------------------------------------------------------------- Vitals Details Patient Name: Date of Service: [name] 11/21/2019 8:00 AM Medical Record [id number] Patient Account Number: [id number] Date of Birth/Sex: Treating RN: [birth year] (74 y.o. M) Primary Care Eman Rynders: [name] Other Clinician: [name] Referring Masey Scheiber: Treating Jahmai Finelli/Extender:Stone III, [name], [name] in Treatment: 6 Vital Signs Time Taken: 07:55 Temperature (F): 98.1 Height (in): 72 Pulse (bpm): 56 Weight (lbs): 240 Respiratory Rate (breaths/min): 18 Body Mass Index (BMI): 32.5 Blood Pressure (mmHg): 136/77 Reference Range: 80 - 120 mg / dl Electronic Signature(s) Signed: 11/21/2019 3:49:18 PM By: [name] EMT/HBOT Entered By: [name] on 11/21/2019 08:10:19

## 2019-11-21 NOTE — Progress Notes (Signed)
EIZAN, DEBARDELABEN ([id number]) Visit Report for 11/21/2019 Problem List Details Patient Name: Date of Service: [name], [name] 11/21/2019 8:00 AM Medical Record [id number] Patient Account Number: [id number] Date of Birth/Sex: Treating RN: July 03, 1946 (74 y.o. 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 Facility Procedures The patient participates with Medicare or their insurance follows the Medicare Facility Guidelines: CPT4 Code Description Modifier Quantity [id number]-(Facility Use Only) HBOT, full body chamber, 54min 4 Physician Procedures CPT4 Code Description: [id number] - WC PHYS HYPERBARIC OXYGEN THERAPY ICD-10 Diagnosis Description N30.41 Irradiation cystitis with  hematuria Modifier: Quantity: 1 Electronic Signature(s) Signed: 11/21/2019 4:52:49 PM By: Worthy Keeler PA-C Previous Signature: 11/21/2019 3:49:18 PM Version By: [name] EMT/HBOT Entered By: Worthy Keeler on 11/21/2019 16:52:49

## 2019-11-22 ENCOUNTER — Encounter (HOSPITAL_BASED_OUTPATIENT_CLINIC_OR_DEPARTMENT_OTHER): Payer: Medicare Other | Admitting: Internal Medicine

## 2019-11-23 ENCOUNTER — Encounter (HOSPITAL_BASED_OUTPATIENT_CLINIC_OR_DEPARTMENT_OTHER): Payer: Medicare Other | Admitting: Internal Medicine

## 2019-11-23 ENCOUNTER — Other Ambulatory Visit: Payer: Self-pay

## 2019-11-23 DIAGNOSIS — K520 Gastroenteritis and colitis due to radiation: Secondary | ICD-10-CM | POA: Diagnosis not present

## 2019-11-23 NOTE — Progress Notes (Signed)
TREAVON, [name] ([id number]) Visit Report for 11/23/2019 Arrival Information Details Patient Name: Date of Service: [name], [name] 11/23/2019 8:00 AM Medical Record [id number] Patient Account Number: [id number] Date of Birth/Sex: Treating RN: 25-Jun-1946 (74 y.o. M) Primary Care [name]: [name] Other Clinician: Mikeal [name] Referring [name]: Treating [name]/Extender:[name], [name], [name] in Treatment: 7 Visit Information History Since Last Visit Added or deleted any medications: No Patient Arrived: Ambulatory Any new allergies or adverse reactions: No Arrival Time: 07:45 Had a fall or experienced change in No Accompanied By: self activities of daily living that may affect Transfer Assistance: None risk of falls: Patient Identification Verified: Yes Signs or symptoms of abuse/neglect since last No Secondary Verification Process Yes visito Completed: Hospitalized since last visit: No Patient Requires Transmission-Based No Implantable device outside of the clinic excluding No Precautions: cellular tissue based products placed in the center Patient Has Alerts: No since last visit: Pain Present Now: No Electronic Signature(s) Signed: 11/23/2019 4:01:45 PM By: [name] EMT/HBOT Entered By: [name] on 11/23/2019 08:20:07 -------------------------------------------------------------------------------- Encounter Discharge Information Details Patient Name: Date of Service: [name], [name] 11/23/2019 8:00 AM Medical Record [id number] Patient Account Number: [id number] Date of Birth/Sex: Treating RN: 07-18-1946 (74 y.o. M) Primary Care [name]: [name] Other Clinician: Mikeal [name] Referring [name]: Treating [name]/Extender:[name], [name], [name] in Treatment: 7 Encounter Discharge Information Items Discharge Condition: Stable Ambulatory Status: Ambulatory Discharge Destination: Home Transportation:  Private Auto Accompanied By: self Schedule Follow-up Appointment: Yes Clinical Summary of Care: Patient Declined Electronic Signature(s) Signed: 11/23/2019 4:01:45 PM By: [name] EMT/HBOT Entered By: [name] on 11/23/2019 12:46:29 -------------------------------------------------------------------------------- Patient/Caregiver Education Details Patient Name: Date of Service: [name] 3/26/2021andnbsp8:00 AM Medical Record [id number] Patient Account Number: [id number] Date of Birth/Gender: Treating RN: 1946/07/21 (73 y.o. M) Primary Care Physician: [name] Other Clinician: Mikeal [name] Referring Physician: Treating Physician/Extender:[name], [name], [name] in Treatment: 7 Education Assessment Education Provided To: Patient Education Topics Provided Hyperbaric Oxygenation: Methods: Explain/Verbal Responses: State content correctly Electronic Signature(s) Signed: 11/23/2019 4:01:45 PM By: [name] EMT/HBOT Entered By: [name] on 11/23/2019 12:46:16 -------------------------------------------------------------------------------- Vitals Details Patient Name: Date of Service: [name] 11/23/2019 8:00 AM Medical Record [id number] Patient Account Number: [id number] Date of Birth/Sex: Treating RN: 04/13/1946 (74 y.o. M) Primary Care [name]: [name] Other Clinician: Mikeal [name] Referring [name]: Treating [name]/Extender:[name], [name], [name] in Treatment: 7 Vital Signs Time Taken: 07:50 Temperature (F): 98 Height (in): 72 Pulse (bpm): 54 Weight (lbs): 240 Respiratory Rate (breaths/min): 17 Body Mass Index (BMI): 32.5 Blood Pressure (mmHg): 147/65 Reference Range: 80 - 120 mg / dl Electronic Signature(s) Signed: 11/23/2019 4:01:45 PM By: [name] EMT/HBOT Entered By: [name] on 11/23/2019 08:20:29

## 2019-11-23 NOTE — Progress Notes (Addendum)
MARTINO, BAJOR ([id number]) Visit Report for 11/23/2019 HBO Details Patient Name: Date of Service: [name], [name] 11/23/2019 8:00 AM Medical Record [id number] Patient Account Number: [id number] Date of Birth/Sex: Treating RN: May 04, 1946 (74 y.o. M) Primary Care [name]: [name] Other Clinician: [name] Referring Tevita Gomer: Treating Jyden Kromer/Extender:[name], [name], [name] in Treatment: 7 HBO Treatment Course Details Treatment Course Number: 1 Ordering Rossanna Spitzley: [name] Total Treatments Ordered: 40 HBO Treatment Start Date: 10/15/2019 HBO Indication: Late Effect of Radiation HBO Treatment Details Treatment Number: 27 Patient Type: Outpatient Chamber Type: Monoplace Chamber Serial #: [id number] Treatment Protocol: 2.5 ATA with 90 minutes oxygen, with two 5 minute air breaks Treatment Details Compression Rate Down: 2.0 psi / minute De-Compression Rate Up: 2.0 psi / minute Air breaks and CompressTx Pressure breathing periods DecompressDecompress Begins Reached (leave unused spaces Begins Ends blank) Chamber Pressure (ATA)1 2.5 2.5 2.5 2.5 2.5 --2.5 1 Clock Time (24 hr) 08:00 08:12 [id number] 10:04 Treatment Length: 124 (minutes) Treatment Segments: 4 Vital Signs Capillary Blood Glucose Reference Range: 80 - 120 mg / dl HBO Diabetic Blood Glucose Intervention Range: <131 mg/dl or >249 mg/dl Time Vitals Blood Respiratory Capillary Blood Glucose Pulse Action Type: Pulse: Temperature: Taken: Pressure: Rate: Glucose (mg/dl): Meter #: Oximetry (%) Taken: Pre 07:50 147/65 54 17 98 Post 10:07 181/73 55 18 98.2 Treatment Response Treatment Toleration: Well Treatment Completion Treatment Completed without Adverse Event Status: Milady Fleener Notes No concerns with treatment given Physician HBO Attestation: I certify that I supervised this HBO treatment in accordance with Medicare guidelines. A trained Yes Yes emergency  response team is readily available per hospital policies and procedures. Continue HBOT as ordered. Yes Electronic Signature(s) Signed: 11/24/2019 8:02:09 AM By: [name] MD Previous Signature: 11/23/2019 4:01:45 PM Version By: [name] EMT/HBOT Entered By: [name] on 11/23/2019 17:38:32 -------------------------------------------------------------------------------- HBO Safety Checklist Details Patient Name: Date of Service: [name], [name] 11/23/2019 8:00 AM Medical Record [id number] Patient Account Number: [id number] Date of Birth/Sex: Treating RN: [birth year] (74 y.o. M) Primary Care Brynlei Klausner: [name] Other Clinician: [name] Referring Maddex Garlitz: Treating Maleak Brazzel/Extender:[name], [name], [name] in Treatment: 7 HBO Safety Checklist Items Safety Checklist Consent Form Signed Patient voided / foley secured and emptied When did you last eato n/a Last dose of injectable or oral agent n/a NA Ostomy pouch emptied and vented if applicable NA All implantable devices assessed, documented and approved NA Intravenous access site secured and place Valuables secured Linens and cotton and cotton/polyester blend (less than 51% polyester) Personal oil-based products / skin lotions / body lotions removed NA Wigs or hairpieces removed NA Smoking or tobacco materials removed Books / newspapers / magazines / loose paper removed Cologne, aftershave, perfume and deodorant removed Jewelry removed (may wrap wedding band) NA Make-up removed Hair care products removed NA Battery operated devices (external) removed NA Heating patches and chemical warmers removed NA Titanium eyewear removed NA Nail polish cured greater than 10 hours NA Casting material cured greater than 10 hours NA Hearing aids removed NA Loose dentures or partials removed NA Prosthetics have been removed Patient demonstrates correct use of air break device (if  applicable) Patient concerns have been addressed Patient grounding bracelet on and cord attached to chamber Specifics for Inpatients (complete in addition to above) Medication sheet sent with patient Intravenous medications needed or due during therapy sent with patient Drainage tubes (e.g. nasogastric tube or chest tube secured and vented) Endotracheal or Tracheotomy tube secured Cuff deflated of air and inflated with saline Airway suctioned Electronic Signature(s)  Signed: 11/23/2019 8:21:05 AM By: [name] EMT/HBOT Entered By: [name] on 11/23/2019 08:21:04

## 2019-11-24 NOTE — Progress Notes (Signed)
ARYASH, VANOUS ([id number]) Visit Report for 11/23/2019 SuperBill Details Patient Name: Date of Service: [name], [name] 11/23/2019 Medical Record [id number] Patient Account Number: [id number] Date of Birth/Sex: Treating RN: 24-Sep-1945 (74 y.o. 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.46 Personal history of malignant neoplasm of prostate Facility Procedures The patient participates with Medicare or their insurance follows the Medicare Facility Guidelines CPT4 Code Description Modifier Quantity [id number] G0277-(Facility Use Only) HBOT, full body chamber, 43min 4 Physician Procedures CPT4 Code Description Modifier Quantity [id number] - WC PHYS HYPERBARIC OXYGEN THERAPY 1 ICD-10 Diagnosis Description N30.41 Irradiation cystitis with hematuria Electronic Signature(s) Signed: 11/23/2019 4:01:45 PM By: [name] EMT/HBOT Signed: 11/24/2019 8:02:09 AM By: [name] MD Entered By: [name] on 11/23/2019 12:45:59

## 2019-11-26 ENCOUNTER — Other Ambulatory Visit: Payer: Self-pay

## 2019-11-26 ENCOUNTER — Encounter (HOSPITAL_BASED_OUTPATIENT_CLINIC_OR_DEPARTMENT_OTHER): Payer: Medicare Other | Admitting: Internal Medicine

## 2019-11-26 DIAGNOSIS — K520 Gastroenteritis and colitis due to radiation: Secondary | ICD-10-CM | POA: Diagnosis not present

## 2019-11-26 NOTE — Progress Notes (Addendum)
EFFIE, KOCHANSKI ([id number]) Visit Report for 11/26/2019 HBO Details Patient Name: Date of Service: [name], [name] 11/26/2019 8:00 AM Medical Record [id number] Patient Account Number: [id number] Date of Birth/Sex: Treating RN: 1946/07/20 (74 y.o. M) Primary Care Lucely Leard: [name] Other Clinician: [name] Referring Doniesha Landau: Treating Rodriquez Thorner/Extender:[name], [name], [name] in Treatment: 7 HBO Treatment Course Details Treatment Course Number: 1 Ordering Andera Cranmer: [name] Total Treatments Ordered: 40 HBO Treatment Start Date: 10/15/2019 HBO Indication: Late Effect of Radiation HBO Treatment Details Treatment Number: 28 Patient Type: Outpatient Chamber Type: Monoplace Chamber Serial #: [id number] Treatment Protocol: 2.5 ATA with 90 minutes oxygen, with two 5 minute air breaks Treatment Details Compression Rate Down: 2.0 psi / minute De-Compression Rate Up: 2.0 psi / minute Air breaks and CompressTx Pressure breathing periods DecompressDecompress Begins Reached (leave unused spaces Begins Ends blank) Chamber Pressure (ATA)1 2.5 2.5 2.5 2.5 2.5 --2.5 1 Clock Time (24 hr) 08:10 08:22 [id number] 10:14 Treatment Length: 124 (minutes) Treatment Segments: 4 Vital Signs Capillary Blood Glucose Reference Range: 80 - 120 mg / dl HBO Diabetic Blood Glucose Intervention Range: <131 mg/dl or >249 mg/dl Time Vitals Blood Respiratory Capillary Blood Glucose Pulse Action Type: Pulse: Temperature: Taken: Pressure: Rate: Glucose (mg/dl): Meter #: Oximetry (%) Taken: Pre 08:05 146/82 58 17 97.6 Post 10:17 156/75 55 15 97.6 Treatment Response Treatment Toleration: Well Treatment Completion Treatment Completed without Adverse Event Status: King Pinzon Notes No concerns with treatment given Physician HBO Attestation: I certify that I supervised this HBO treatment in accordance with Medicare guidelines. A trained Yes Yes emergency  response team is readily available per hospital policies and procedures. Continue HBOT as ordered. Yes Electronic Signature(s) Signed: 11/26/2019 5:31:36 PM By: [name] MD Entered By: [name] on 11/26/2019 17:21:35 -------------------------------------------------------------------------------- HBO Safety Checklist Details Patient Name: Date of Service: [name], [name] 11/26/2019 8:00 AM Medical Record [id number] Patient Account Number: [id number] Date of Birth/Sex: Treating RN: [birth year] (74 y.o. M) Primary Care Hiro Vipond: [name] Other Clinician: [name] Referring Frayda Egley: Treating Trigg [name]/Extender:[name], [name], [name] in Treatment: 7 HBO Safety Checklist Items Safety Checklist Consent Form Signed Patient voided / foley secured and emptied When did you last eato n/a Last dose of injectable or oral agent n/a NA Ostomy pouch emptied and vented if applicable NA All implantable devices assessed, documented and approved NA Intravenous access site secured and place Valuables secured Linens and cotton and cotton/polyester blend (less than 51% polyester) Personal oil-based products / skin lotions / body lotions removed NA Wigs or hairpieces removed NA Smoking or tobacco materials removed Books / newspapers / magazines / loose paper removed Cologne, aftershave, perfume and deodorant removed Jewelry removed (may wrap wedding band) NA Make-up removed Hair care products removed NA Battery operated devices (external) removed NA Heating patches and chemical warmers removed NA Titanium eyewear removed NA Nail polish cured greater than 10 hours NA Casting material cured greater than 10 hours NA Hearing aids removed NA Loose dentures or partials removed NA Prosthetics have been removed Patient demonstrates correct use of air break device (if applicable) Patient concerns have been addressed Patient grounding bracelet on and cord  attached to chamber Specifics for Inpatients (complete in addition to above) Medication sheet sent with patient Intravenous medications needed or due during therapy sent with patient Drainage tubes (e.g. nasogastric tube or chest tube secured and vented) Endotracheal or Tracheotomy tube secured Cuff deflated of air and inflated with saline Airway suctioned Electronic Signature(s) Signed: 11/26/2019 9:16:50 AM By: [name] EMT/HBOT Entered By:  [name] on 11/26/2019 09:16:49

## 2019-11-27 ENCOUNTER — Encounter (HOSPITAL_BASED_OUTPATIENT_CLINIC_OR_DEPARTMENT_OTHER): Payer: Medicare Other | Admitting: Internal Medicine

## 2019-11-27 DIAGNOSIS — K520 Gastroenteritis and colitis due to radiation: Secondary | ICD-10-CM | POA: Diagnosis not present

## 2019-11-27 NOTE — Progress Notes (Addendum)
[name], [name] ([id number]) Visit Report for 11/27/2019 HBO Details Patient Name: Date of Service: [name], [name] 11/27/2019 8:00 AM Medical Record [id number] Patient Account Number: [id number] Date of Birth/Sex: Treating RN: Oct 07, 1945 (74 y.o. M) Primary Care Adley Castello: [name] Other Clinician: [name] Referring Jarelyn Bambach: Treating Shealynn Saulnier/Extender:[name], [name], [name] in Treatment: 7 HBO Treatment Course Details Treatment Course Number: 1 Ordering Janai Brannigan: [name] Total Treatments Ordered: 40 HBO Treatment Start Date: 10/15/2019 HBO Indication: Late Effect of Radiation HBO Treatment Details Treatment Number: 29 Patient Type: Outpatient Chamber Type: Monoplace Chamber Serial #: [id number] Treatment Protocol: 2.5 ATA with 90 minutes oxygen, with two 5 minute air breaks Treatment Details Compression Rate Down: 2.0 psi / minute De-Compression Rate Up: 2.0 psi / minute Air breaks and CompressTx Pressure breathing periods DecompressDecompress Begins Reached (leave unused spaces Begins Ends blank) Chamber Pressure (ATA)1 2.5 2.5 2.5 2.5 2.5 --2.5 1 Clock Time (24 hr) 08:03 08:15 [id number]:4508:5009:2009:25--09:55 10:07 Treatment Length: 124 (minutes) Treatment Segments: 4 Vital Signs Capillary Blood Glucose Reference Range: 80 - 120 mg / dl HBO Diabetic Blood Glucose Intervention Range: <131 mg/dl or >249 mg/dl Time Vitals Blood Respiratory Capillary Blood Glucose Pulse Action Type: Pulse: Temperature: Taken: Pressure: Rate: Glucose (mg/dl): Meter #: Oximetry (%) Taken: Pre 07:50 139/60 55 15 98.6 Post 10:10 156/79 51 15 98.4 Treatment Response Treatment Toleration: Well Treatment Completion Treatment Completed without Adverse Event Status: Edris Friedt Notes no concerns with treatment Physician HBO Attestation: I certify that I supervised this HBO treatment in accordance with Medicare guidelines. A trained Yes Yes emergency response  team is readily available per hospital policies and procedures. Continue HBOT as ordered. Yes Electronic Signature(s) Signed: 12/03/2019 7:44:34 AM By: [name] MD Entered By: [name] on 11/28/2019 05:34:32 -------------------------------------------------------------------------------- HBO Safety Checklist Details Patient Name: Date of Service: [name], [name] 11/27/2019 8:00 AM Medical Record [id number] Patient Account Number: [id number] Date of Birth/Sex: Treating RN: [birth year] (74 y.o. M) Primary Care Dasan Hardman: [name] Other Clinician: [name] Referring Cobey Raineri: Treating Alejos Reinhardt/Extender:[name], [name], [name] in Treatment: 7 HBO Safety Checklist Items Safety Checklist Consent Form Signed Patient voided / foley secured and emptied When did you last eato n/a Last dose of injectable or oral agent n/a NA Ostomy pouch emptied and vented if applicable NA All implantable devices assessed, documented and approved NA Intravenous access site secured and place Valuables secured Linens and cotton and cotton/polyester blend (less than 51% polyester) Personal oil-based products / skin lotions / body lotions removed NA Wigs or hairpieces removed NA Smoking or tobacco materials removed Books / newspapers / magazines / loose paper removed Cologne, aftershave, perfume and deodorant removed Jewelry removed (may wrap wedding band) NA Make-up removed Hair care products removed NA Battery operated devices (external) removed NA Heating patches and chemical warmers removed NA Titanium eyewear removed NA Nail polish cured greater than 10 hours NA Casting material cured greater than 10 hours NA Hearing aids removed NA Loose dentures or partials removed NA Prosthetics have been removed Patient demonstrates correct use of air break device (if applicable) Patient concerns have been addressed Patient grounding bracelet on and cord attached to  chamber Specifics for Inpatients (complete in addition to above) Medication sheet sent with patient Intravenous medications needed or due during therapy sent with patient Drainage tubes (e.g. nasogastric tube or chest tube secured and vented) Endotracheal or Tracheotomy tube secured Cuff deflated of air and inflated with saline Airway suctioned Electronic Signature(s) Signed: 11/27/2019 8:18:40 AM By: [name] EMT/HBOT Entered By: Ronnald Ramp,  Dedrick on 11/27/2019 08:18:40

## 2019-11-28 ENCOUNTER — Other Ambulatory Visit: Payer: Self-pay

## 2019-11-28 ENCOUNTER — Encounter (HOSPITAL_BASED_OUTPATIENT_CLINIC_OR_DEPARTMENT_OTHER): Payer: Medicare Other | Admitting: Physician Assistant

## 2019-11-28 DIAGNOSIS — K520 Gastroenteritis and colitis due to radiation: Secondary | ICD-10-CM | POA: Diagnosis not present

## 2019-11-28 NOTE — Progress Notes (Signed)
[name], [name] ([id number]) Visit Report for 11/28/2019 Problem List Details Patient Name: Date of Service: [name], [name] 11/28/2019 8:00 AM Medical Record [id number] Patient Account Number: [id number] Date of Birth/Sex: Treating RN: [birth year] (74 y.o. 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.0 Encounter for antineoplastic radiation therapy Z85.46 Personal history of malignant neoplasm of prostate Facility Procedures The patient participates with Medicare or their insurance follows the Medicare Facility Guidelines: CPT4 Code Description Modifier Quantity [id number] G0277-(Facility Use Only) HBOT, full body chamber, 58min 4 Physician Procedures CPT4 Code Description: [id number] - WC PHYS HYPERBARIC OXYGEN THERAPY ICD-10 Diagnosis Description  N30.41 Irradiation cystitis with hematuria Modifier: Quantity: 1 Electronic Signature(s) Signed: 11/28/2019 10:03:04 PM By: Worthy Keeler PA-C Previous Signature: 11/28/2019 4:28:52 PM Version By: [name] EMT/HBOT Entered By: Worthy Keeler on 11/28/2019 22:03:04

## 2019-11-28 NOTE — Progress Notes (Signed)
[name] ([id number]) Visit Report for 11/28/2019 Arrival Information Details Patient Name: Date of Service: [name], [name] 11/28/2019 8:00 AM Medical Record [id number] Patient Account Number: [id number] Date of Birth/Sex: Treating RN: [birth year] (74 y.o. [name] Primary Care Krishawn Vanderweele: [name] Other Clinician: [name] Referring Mckenzee Beem: Treating Lexx Monte/Extender:Stone III, [name], [name] in Treatment: 7 Visit Information History Since Last Visit Added or deleted any medications: No Patient Arrived: Ambulatory Any new allergies or adverse reactions: No Arrival Time: 07:55 Had a fall or experienced change in No Accompanied By: self activities of daily living that may affect Transfer Assistance: None risk of falls: Patient Identification Verified: Yes Signs or symptoms of abuse/neglect since last No Secondary Verification Process Yes visito Completed: Hospitalized since last visit: No Patient Requires Transmission-Based No Implantable device outside of the clinic excluding No Precautions: cellular tissue based products placed in the center Patient Has Alerts: No since last visit: Pain Present Now: No Electronic Signature(s) Signed: 11/28/2019 4:28:52 PM By: [name] EMT/HBOT Entered By: [name] on 11/28/2019 10:39:57 -------------------------------------------------------------------------------- Encounter Discharge Information Details Patient Name: Date of Service: [name], [name] 11/28/2019 8:00 AM Medical Record [id number] Patient Account Number: [id number] Date of Birth/Sex: Treating RN: [birth year] (74 y.o. [name] Primary Care Avelardo Reesman: [name] Other Clinician: [name] Referring Loris Seelye: Treating Eugena Rhue/Extender:Stone III, [name], [name] in Treatment: 7 Encounter Discharge Information Items Discharge Condition: Stable Ambulatory Status: Ambulatory Discharge  Destination: Home Transportation: Private Auto Accompanied By: self Schedule Follow-up Appointment: Yes Clinical Summary of Care: Patient Declined Electronic Signature(s) Signed: 11/28/2019 4:28:52 PM By: [name] EMT/HBOT Entered By: [name] on 11/28/2019 10:42:43 -------------------------------------------------------------------------------- Patient/Caregiver Education Details Patient Name: Date of Service: [name], [name] 3/31/2021andnbsp8:00 AM Medical Record [id number] Patient Account Number: [id number] Date of Birth/Gender: Treating RN: 1945-11-15 (73 y.o. [name] Primary Care Physician: [name] Other Clinician: [name] Referring Physician: Treating Physician/Extender:Stone III, [name], [name] in Treatment: 7 Education Assessment Education Provided To: Patient Education Topics Provided Hyperbaric Oxygenation: Methods: Explain/Verbal Responses: State content correctly Electronic Signature(s) Signed: 11/28/2019 4:28:52 PM By: [name] EMT/HBOT Entered By: [name] on 11/28/2019 10:42:31 -------------------------------------------------------------------------------- Vitals Details Patient Name: Date of Service: [name] 11/28/2019 8:00 AM Medical Record [id number] Patient Account Number: [id number] Date of Birth/Sex: Treating RN: [birth year] (74 y.o. [name] Primary Care Seena Ritacco: [name] Other Clinician: [name] Referring Trixie Maclaren: Treating Reford Olliff/Extender:Stone III, [name], [name] in Treatment: 7 Vital Signs Time Taken: 08:00 Temperature (F): 98.2 Height (in): 72 Pulse (bpm): 56 Weight (lbs): 240 Respiratory Rate (breaths/min): 17 Body Mass Index (BMI): 32.5 Blood Pressure (mmHg): 122/63 Reference Range: 80 - 120 mg / dl Electronic Signature(s) Signed: 11/28/2019 4:28:52 PM By: [name] EMT/HBOT Entered By: [name] on 11/28/2019  10:40:41

## 2019-11-28 NOTE — Progress Notes (Signed)
[name] ([id number]) Visit Report for 11/27/2019 Arrival Information Details Patient Name: Date of Service: [name], [name] 11/27/2019 8:00 AM Medical Record [id number] Patient Account Number: [id number] Date of Birth/Sex: Treating RN: 08-09-1946 (74 y.o. M) Primary Care Sharnese Heath: [name] Other Clinician: [name] Referring Linsie Lupo: Treating Kaydyn Chism/Extender:[name], [name], [name] in Treatment: 7 Visit Information History Since Last Visit Added or deleted any medications: No Patient Arrived: Ambulatory Any new allergies or adverse reactions: No Arrival Time: 07:50 Had a fall or experienced change in No Accompanied By: self activities of daily living that may affect Transfer Assistance: None risk of falls: Patient Identification Verified: Yes Signs or symptoms of abuse/neglect since last No Secondary Verification Process Yes visito Completed: Hospitalized since last visit: No Patient Requires Transmission-Based No Implantable device outside of the clinic excluding No Precautions: cellular tissue based products placed in the center Patient Has Alerts: No since last visit: Pain Present Now: No Electronic Signature(s) Signed: 11/28/2019 4:28:52 PM By: [name] EMT/HBOT Entered By: [name] on 11/27/2019 08:16:30 -------------------------------------------------------------------------------- Encounter Discharge Information Details Patient Name: Date of Service: [name] 11/27/2019 8:00 AM Medical Record [id number] Patient Account Number: [id number] Date of Birth/Sex: Treating RN: May 11, 1946 (74 y.o. M) Primary Care Shron Ozer: [name] Other Clinician: [name] Referring Herley Bernardini: Treating Xochil Shanker/Extender:[name], [name], [name] in Treatment: 7 Encounter Discharge Information Items Discharge Condition: Stable Ambulatory Status: Ambulatory Discharge Destination: Home Transportation:  Private Auto Accompanied By: self Schedule Follow-up Appointment: Yes Clinical Summary of Care: Patient Declined Electronic Signature(s) Signed: 11/28/2019 4:28:52 PM By: [name] EMT/HBOT Entered By: [name] on 11/27/2019 13:44:39 -------------------------------------------------------------------------------- Patient/Caregiver Education Details Patient Name: Date of Service: [name], [name] 3/30/2021andnbsp8:00 AM Medical Record [id number] Patient Account Number: [id number] Date of Birth/Gender: Treating RN: May 29, 1946 (73 y.o. M) Primary Care Physician: [name] Other Clinician: [name] Referring Physician: Treating Physician/Extender:[name], [name], [name] in Treatment: 7 Education Assessment Education Provided To: Patient Education Topics Provided Hyperbaric Oxygenation: Methods: Explain/Verbal Responses: State content correctly Electronic Signature(s) Signed: 11/28/2019 4:28:52 PM By: [name] EMT/HBOT Entered By: [name] on 11/27/2019 13:44:28 -------------------------------------------------------------------------------- Vitals Details Patient Name: Date of Service: [name], [name] 11/27/2019 8:00 AM Medical Record [id number] Patient Account Number: [id number] Date of Birth/Sex: Treating RN: 1946/05/31 (74 y.o. M) Primary Care Loribeth Katich: [name] Other Clinician: [name] Referring Jb Dulworth: Treating Savreen Gebhardt/Extender:[name], [name], [name] in Treatment: 7 Vital Signs Time Taken: 07:50 Temperature (F): 98.6 Height (in): 72 Pulse (bpm): 55 Weight (lbs): 240 Respiratory Rate (breaths/min): 15 Body Mass Index (BMI): 32.5 Blood Pressure (mmHg): 139/60 Reference Range: 80 - 120 mg / dl Electronic Signature(s) Signed: 11/28/2019 4:28:52 PM By: [name] EMT/HBOT Entered By: [name] on 11/27/2019 08:17:24

## 2019-11-28 NOTE — Progress Notes (Addendum)
[name], [name] ([id number]) Visit Report for 11/28/2019 HBO Details Patient Name: Date of Service: [name], [name] 11/28/2019 8:00 AM Medical Record [id number] Patient Account Number: [id number] Date of Birth/Sex: Treating RN: 22-Apr-1946 (74 y.o. [name] Primary Care Joyanne Eddinger: [name] Other Clinician: [name] Referring Senon Nixon: Treating Miyako Oelke/Extender:Stone III, [name], [name] in Treatment: 7 HBO Treatment Course Details Treatment Course Number: 1 Ordering Jermany Rimel: [name] Total Treatments Ordered: 40 HBO Treatment Start Date: 10/15/2019 HBO Indication: Late Effect of Radiation HBO Treatment Details Treatment Number: 30 Patient Type: Outpatient Chamber Type: Monoplace Chamber Serial #: [id number] Treatment Protocol: 2.5 ATA with 90 minutes oxygen, with two 5 minute air breaks Treatment Details Compression Rate Down: 2.0 psi / minute De-Compression Rate Up: 2.0 psi / minute Air breaks and CompressTx Pressure breathing periods DecompressDecompress Begins Reached (leave unused spaces Begins Ends blank) Chamber Pressure (ATA)1 2.5 2.5 2.5 2.5 2.5 --2.5 1 Clock Time (24 hr) 08:08 08:20 [id number] 10:12 Treatment Length: 124 (minutes) Treatment Segments: 4 Vital Signs Capillary Blood Glucose Reference Range: 80 - 120 mg / dl HBO Diabetic Blood Glucose Intervention Range: <131 mg/dl or >249 mg/dl Time Vitals Blood Respiratory Capillary Blood Glucose Pulse Action Type: Pulse: Temperature: Taken: Pressure: Rate: Glucose (mg/dl): Meter #: Oximetry (%) Taken: Pre 08:00 122/63 56 17 98.2 Post 10:15 160/70 52 14 97.8 Treatment Response Treatment Toleration: Well Treatment Completion Treatment Completed without Adverse Event Status: Physician HBO Attestation: I certify that I supervised this HBO treatment in accordance with Medicare guidelines. A trained Yes emergency response team is readily available  per hospital policies and procedures. Continue HBOT as ordered. Yes Electronic Signature(s) Signed: 11/28/2019 10:02:55 PM By: Worthy Keeler PA-C Previous Signature: 11/28/2019 4:28:52 PM Version By: [name] EMT/HBOT Entered By: Worthy Keeler on 11/28/2019 22:02:55 -------------------------------------------------------------------------------- HBO Safety Checklist Details Patient Name: Date of Service: [name], [name] 11/28/2019 8:00 AM Medical Record [id number] Patient Account Number: [id number] Date of Birth/Sex: Treating RN: 07-Apr-1946 (74 y.o. [name] Primary Care Renada Cronin: [name] Other Clinician: [name] Referring Tarnisha Kachmar: Treating Kandiss Ihrig/Extender:Stone III, [name], [name] in Treatment: 7 HBO Safety Checklist Items Safety Checklist Consent Form Signed Patient voided / foley secured and emptied When did you last eato n/a Last dose of injectable or oral agent n/a NA Ostomy pouch emptied and vented if applicable NA All implantable devices assessed, documented and approved NA Intravenous access site secured and place Valuables secured Linens and cotton and cotton/polyester blend (less than 51% polyester) Personal oil-based products / skin lotions / body lotions removed NA Wigs or hairpieces removed NA Smoking or tobacco materials removed Books / newspapers / magazines / loose paper removed Cologne, aftershave, perfume and deodorant removed Jewelry removed (may wrap wedding band) NA Make-up removed Hair care products removed NA Battery operated devices (external) removed NA Heating patches and chemical warmers removed NA Titanium eyewear removed NA Nail polish cured greater than 10 hours NA Casting material cured greater than 10 hours NA Hearing aids removed NA Loose dentures or partials removed NA Prosthetics have been removed Patient demonstrates correct use of air break device (if applicable) Patient concerns have  been addressed Patient grounding bracelet on and cord attached to chamber Specifics for Inpatients (complete in addition to above) Medication sheet sent with patient Intravenous medications needed or due during therapy sent with patient Drainage tubes (e.g. nasogastric tube or chest tube secured and vented) Endotracheal or Tracheotomy tube secured Cuff deflated of air and inflated with saline Airway suctioned Electronic Signature(s)  Signed: 11/28/2019 10:41:17 AM By: [name] EMT/HBOT Entered By: [name] on 11/28/2019 10:41:17

## 2019-11-28 NOTE — Progress Notes (Signed)
INFINITE, NAYLOR ([id number]) Visit Report for 11/26/2019 SuperBill Details Patient Name: Date of Service: [name], [name] 11/26/2019 Medical Record [id number] Patient Account Number: [id number] Date of Birth/Sex: Treating RN: [birth year] (74 y.o. 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 Encounter for antineoplastic radiation therapy Z85.46 Personal history of malignant neoplasm of prostate Facility Procedures The patient participates with Medicare or their insurance follows the Medicare Facility Guidelines CPT4 Code Description Modifier Quantity [id number] G0277-(Facility Use Only) HBOT, full body chamber, 34min 4 Physician Procedures CPT4 Code Description Modifier Quantity [id number] - WC PHYS HYPERBARIC OXYGEN THERAPY 1 ICD-10 Diagnosis Description N30.41 Irradiation cystitis with hematuria Electronic Signature(s) Signed: 11/26/2019 5:31:36 PM By: [name] MD Signed: 11/28/2019 4:28:52 PM By: [name] EMT/HBOT Entered By: [name] on 11/26/2019 11:29:15

## 2019-11-28 NOTE — Progress Notes (Signed)
[name] ([id number]) Visit Report for 11/26/2019 Arrival Information Details Patient Name: Date of Service: [name], [name] 11/26/2019 8:00 AM Medical Record [id number] Patient Account Number: [id number] Date of Birth/Sex: Treating RN: 1945/12/24 (74 y.o. M) Primary Care Shrey Boike: [name] Other Clinician: [name] Referring Charlis Harner: Treating Shiron Whetsel/Extender:[name], [name], [name] in Treatment: 7 Visit Information History Since Last Visit Added or deleted any medications: No Patient Arrived: Ambulatory Any new allergies or adverse reactions: No Arrival Time: 08:00 Had a fall or experienced change in No Accompanied By: self activities of daily living that may affect Transfer Assistance: None risk of falls: Patient Identification Verified: Yes Signs or symptoms of abuse/neglect since last No Secondary Verification Process Yes visito Completed: Hospitalized since last visit: No Patient Requires Transmission-Based No Implantable device outside of the clinic excluding No Precautions: cellular tissue based products placed in the center Patient Has Alerts: No since last visit: Pain Present Now: No Electronic Signature(s) Signed: 11/28/2019 4:28:52 PM By: [name] EMT/HBOT Entered By: [name] on 11/26/2019 09:14:57 -------------------------------------------------------------------------------- Encounter Discharge Information Details Patient Name: Date of Service: [name] 11/26/2019 8:00 AM Medical Record [id number] Patient Account Number: [id number] Date of Birth/Sex: Treating RN: 01-06-1946 (74 y.o. M) Primary Care Reiko Vinje: [name] Other Clinician: [name] Referring Nerine Pulse: Treating Stephan Draughn/Extender:[name], [name], [name] in Treatment: 7 Encounter Discharge Information Items Discharge Condition: Stable Ambulatory Status: Ambulatory Discharge Destination: Home Transportation:  Private Auto Accompanied By: self Schedule Follow-up Appointment: Yes Clinical Summary of Care: Patient Declined Electronic Signature(s) Signed: 11/28/2019 4:28:52 PM By: [name] EMT/HBOT Entered By: [name] on 11/26/2019 11:29:44 -------------------------------------------------------------------------------- Patient/Caregiver Education Details Patient Name: Date of Service: [name] 3/29/2021andnbsp8:00 AM Medical Record [id number] Patient Account Number: [id number] Date of Birth/Gender: Treating RN: 08/22/1946 (73 y.o. M) Primary Care Physician: [name] Other Clinician: [name] Referring Physician: Treating Physician/Extender:[name], [name], [name] in Treatment: 7 Education Assessment Education Provided To: Patient Education Topics Provided Hyperbaric Oxygenation: Methods: Explain/Verbal Responses: State content correctly Electronic Signature(s) Signed: 11/28/2019 4:28:52 PM By: [name] EMT/HBOT Entered By: [name] on 11/26/2019 11:29:31 -------------------------------------------------------------------------------- Vitals Details Patient Name: Date of Service: [name] 11/26/2019 8:00 AM Medical Record [id number] Patient Account Number: [id number] Date of Birth/Sex: Treating RN: 25-Nov-1945 (74 y.o. M) Primary Care Nephi Savage: [name] Other Clinician: [name] Referring Valentina Alcoser: Treating Nyomi Howser/Extender:[name], [name], [name] in Treatment: 7 Vital Signs Time Taken: 08:05 Temperature (F): 97.6 Height (in): 72 Pulse (bpm): 58 Weight (lbs): 240 Respiratory Rate (breaths/min): 17 Body Mass Index (BMI): 32.5 Blood Pressure (mmHg): 146/82 Reference Range: 80 - 120 mg / dl Electronic Signature(s) Signed: 11/28/2019 4:28:52 PM By: [name] EMT/HBOT Entered By: [name] on 11/26/2019 09:15:13

## 2019-11-29 ENCOUNTER — Encounter (HOSPITAL_BASED_OUTPATIENT_CLINIC_OR_DEPARTMENT_OTHER): Payer: Medicare Other | Admitting: Internal Medicine

## 2019-11-30 ENCOUNTER — Other Ambulatory Visit: Payer: Self-pay

## 2019-11-30 ENCOUNTER — Encounter (HOSPITAL_BASED_OUTPATIENT_CLINIC_OR_DEPARTMENT_OTHER): Payer: Medicare Other | Attending: Internal Medicine | Admitting: Physician Assistant

## 2019-11-30 DIAGNOSIS — N3041 Irradiation cystitis with hematuria: Secondary | ICD-10-CM | POA: Insufficient documentation

## 2019-11-30 DIAGNOSIS — Y842 Radiological procedure and radiotherapy as the cause of abnormal reaction of the patient, or of later complication, without mention of misadventure at the time of the procedure: Secondary | ICD-10-CM | POA: Diagnosis not present

## 2019-11-30 DIAGNOSIS — Z8546 Personal history of malignant neoplasm of prostate: Secondary | ICD-10-CM | POA: Diagnosis not present

## 2019-11-30 NOTE — Progress Notes (Addendum)
KASSEN, FINE ([id number]) Visit Report for 11/30/2019 HBO Details Patient Name: Date of Service: [name], [name] 11/30/2019 2:00 PM Medical Record [id number] Patient Account Number: [id number] Date of Birth/Sex: Treating RN: [birth year] (74 y.o. Male) Primary Care Mckoy Bhakta: [name] Other Clinician: [name] Referring Obaloluwa Delatte: Treating Taejah Ohalloran/Extender:Stone III, [name], [name] in Treatment: 8 HBO Treatment Course Details Treatment Course Number: 1 Ordering Irianna Gilday: [name] Total Treatments Ordered: 40 HBO Treatment Start Date: 10/15/2019 HBO Indication: Late Effect of Radiation HBO Treatment Details Treatment Number: 31 Patient Type: Outpatient Chamber Type: Monoplace Chamber Serial #: [id number] Treatment Protocol: 2.5 ATA with 90 minutes oxygen, with two 5 minute air breaks Treatment Details Compression Rate Down: 2.0 psi / minute De-Compression Rate Up: 2.0 psi / minute Air breaks and CompressTx Pressure breathing periods DecompressDecompress Begins Reached (leave unused spaces Begins Ends blank) Chamber Pressure (ATA)1 2.5 2.5 2.5 2.5 2.5 --2.5 1 Clock Time (24 hr) 14:44 14:56 [id number] 16:48 Treatment Length: 124 (minutes) Treatment Segments: 4 Vital Signs Capillary Blood Glucose Reference Range: 80 - 120 mg / dl HBO Diabetic Blood Glucose Intervention Range: <131 mg/dl or >249 mg/dl Time Vitals Blood Respiratory Capillary Blood Glucose Pulse Action Type: Pulse: Temperature: Taken: Pressure: Rate: Glucose (mg/dl): Meter #: Oximetry (%) Taken: Pre 14:20 140/54 53 16 97.8 Post 16:50 147/74 53 14 98.2 Treatment Response Treatment Toleration: Well Treatment Completion Treatment Completed without Adverse Event Status: Electronic Signature(s) Signed: 12/03/2019 3:38:17 PM By: [name] EMT/HBOT Signed: 12/05/2019 7:02:55 PM By: Worthy Keeler PA-C Entered By: [name] on 11/30/2019  16:57:27 -------------------------------------------------------------------------------- HBO Safety Checklist Details Patient Name: Date of Service: [name] 11/30/2019 2:00 PM Medical Record [id number] Patient Account Number: [id number] Date of Birth/Sex: Treating RN: [birth year] (74 y.o. Male) Primary Care Ryeleigh Santore: [name] Other Clinician: [name] Referring Theresa Dohrman: Treating Emani Morad/Extender:Stone III, [name], [name] in Treatment: 8 HBO Safety Checklist Items Safety Checklist Consent Form Signed Patient voided / foley secured and emptied When did you last eato n/a Last dose of injectable or oral agent n/a NA Ostomy pouch emptied and vented if applicable NA All implantable devices assessed, documented and approved NA Intravenous access site secured and place Valuables secured Linens and cotton and cotton/polyester blend (less than 51% polyester) Personal oil-based products / skin lotions / body lotions removed NA Wigs or hairpieces removed NA Smoking or tobacco materials removed Books / newspapers / magazines / loose paper removed Cologne, aftershave, perfume and deodorant removed Jewelry removed (may wrap wedding band) NA Make-up removed Hair care products removed NA Battery operated devices (external) removed NA Heating patches and chemical warmers removed NA Titanium eyewear removed NA Nail polish cured greater than 10 hours NA Casting material cured greater than 10 hours NA Hearing aids removed NA Loose dentures or partials removed NA Prosthetics have been removed Patient demonstrates correct use of air break device (if applicable) Patient concerns have been addressed Patient grounding bracelet on and cord attached to chamber Specifics for Inpatients (complete in addition to above) Medication sheet sent with patient Intravenous medications needed or due during therapy sent with patient Drainage tubes (e.g. nasogastric tube or  chest tube secured and vented) Endotracheal or Tracheotomy tube secured Cuff deflated of air and inflated with saline Airway suctioned Electronic Signature(s) Signed: 11/30/2019 3:20:47 PM By: [name] EMT/HBOT Entered By: [name] on 11/30/2019 15:20:47

## 2019-12-03 ENCOUNTER — Other Ambulatory Visit: Payer: Self-pay

## 2019-12-03 ENCOUNTER — Ambulatory Visit: Payer: Medicare Other | Admitting: Podiatry

## 2019-12-03 ENCOUNTER — Encounter (HOSPITAL_BASED_OUTPATIENT_CLINIC_OR_DEPARTMENT_OTHER): Payer: Medicare Other | Admitting: Internal Medicine

## 2019-12-03 DIAGNOSIS — N3041 Irradiation cystitis with hematuria: Secondary | ICD-10-CM | POA: Diagnosis not present

## 2019-12-03 NOTE — Progress Notes (Signed)
[name] ([id number]) Visit Report for 11/30/2019 Arrival Information Details Patient Name: Date of Service: [name], [name] 11/30/2019 2:00 PM Medical Record [id number] Patient Account Number: [id number] Date of Birth/Sex: Treating RN: Jan 17, 1946 (74 y.o. M) Primary Care Adylene Dlugosz: [name] Other Clinician: [name] Referring Glenmore Karl: Treating Lavilla Delamora/Extender:Stone III, [name], [name] in Treatment: 8 Visit Information History Since Last Visit Added or deleted any medications: No Patient Arrived: Ambulatory Any new allergies or adverse reactions: No Arrival Time: 14:15 Had a fall or experienced change in No Accompanied By: self activities of daily living that may affect Transfer Assistance: None risk of falls: Patient Identification Verified: Yes Signs or symptoms of abuse/neglect since last No Secondary Verification Process Yes visito Completed: Hospitalized since last visit: No Patient Requires Transmission-Based No Implantable device outside of the clinic excluding No Precautions: cellular tissue based products placed in the center Patient Has Alerts: No since last visit: Pain Present Now: No Electronic Signature(s) Signed: 12/03/2019 3:38:17 PM By: [name] EMT/HBOT Entered By: [name] on 11/30/2019 15:20:02 -------------------------------------------------------------------------------- Encounter Discharge Information Details Patient Name: Date of Service: [name] 11/30/2019 2:00 PM Medical Record [id number] Patient Account Number: [id number] Date of Birth/Sex: Treating RN: September 01, 1945 (74 y.o. M) Primary Care Ondra Deboard: [name] Other Clinician: [name] Referring Rosabell Geyer: Treating Guillaume Weninger/Extender:Stone III, [name], [name] in Treatment: 8 Encounter Discharge Information Items Discharge Condition: Stable Ambulatory Status: Ambulatory Discharge Destination: Home Transportation:  Private Auto Accompanied By: self Schedule Follow-up Appointment: Yes Clinical Summary of Care: Patient Declined Electronic Signature(s) Signed: 12/03/2019 3:38:17 PM By: [name] EMT/HBOT Entered By: [name] on 11/30/2019 16:58:07 -------------------------------------------------------------------------------- Patient/Caregiver Education Details Patient Name: Date of Service: [name] 4/2/2021andnbsp2:00 PM Medical Record [id number] Patient Account Number: [id number] Date of Birth/Gender: Treating RN: 10/20/1945 (73 y.o. M) Primary Care Physician: [name] Other Clinician: [name] Referring Physician: Treating Physician/Extender:Stone III, [name], [name] in Treatment: 8 Education Assessment Education Provided To: Patient Education Topics Provided Hyperbaric Oxygenation: Methods: Explain/Verbal Responses: State content correctly Electronic Signature(s) Signed: 12/03/2019 3:38:17 PM By: [name] EMT/HBOT Entered By: [name] on 11/30/2019 16:57:47 -------------------------------------------------------------------------------- Vitals Details Patient Name: Date of Service: [name] 11/30/2019 2:00 PM Medical Record [id number] Patient Account Number: [id number] Date of Birth/Sex: Treating RN: Dec 01, 1945 (74 y.o. M) Primary Care Bonifacio Pruden: [name] Other Clinician: [name] Referring Ad Guttman: Treating Tobi Leinweber/Extender:Stone III, [name], [name] in Treatment: 8 Vital Signs Time Taken: 14:20 Temperature (F): 97.8 Height (in): 72 Pulse (bpm): 53 Weight (lbs): 240 Respiratory Rate (breaths/min): 16 Body Mass Index (BMI): 32.5 Blood Pressure (mmHg): 140/54 Reference Range: 80 - 120 mg / dl Electronic Signature(s) Signed: 12/03/2019 3:38:17 PM By: [name] EMT/HBOT Entered By: [name] on 11/30/2019 15:20:18

## 2019-12-03 NOTE — Progress Notes (Signed)
[name], [name] ([id number]) Visit Report for 12/03/2019 SuperBill Details Patient Name: Date of Service: [name], [name] 12/03/2019 Medical Record [id number] Patient Account Number: [id number] Date of Birth/Sex: Treating RN: [birth year] (74 y.o. 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 Encounter for antineoplastic radiation therapy Z85.46 Personal history of malignant neoplasm of prostate Facility Procedures The patient participates with Medicare or their insurance follows the Medicare Facility Guidelines CPT4 Code Description Modifier Quantity [id number] G0277-(Facility Use Only) HBOT, full body chamber, 89min 4 Physician Procedures CPT4 Code Description Modifier Quantity [id number] - WC PHYS HYPERBARIC OXYGEN THERAPY 1 ICD-10 Diagnosis Description N30.41 Irradiation cystitis with hematuria Electronic Signature(s) Signed: 12/03/2019 3:38:17 PM By: [name] EMT/HBOT Signed: 12/03/2019 5:24:04 PM By: [name] MD Entered By: [name] on 12/03/2019 11:35:02

## 2019-12-03 NOTE — Progress Notes (Addendum)
MANFORD, ECKHARDT ([id number]) Visit Report for 12/03/2019 HBO Details Patient Name: Date of Service: [name], [name] 12/03/2019 8:00 AM Medical Record [id number] Patient Account Number: [id number] Date of Birth/Sex: Treating RN: 12/22/1945 (74 y.o. M) Primary Care Marquarius Lofton: [name] Other Clinician: [name] Referring Aide Wojnar: Treating Gaynor Ferreras/Extender:[name], [name], [name] in Treatment: 8 HBO Treatment Course Details Treatment Course Number: 1 Ordering Candelario Steppe: [name] Total Treatments Ordered: 40 HBO Treatment Start Date: 10/15/2019 HBO Indication: Late Effect of Radiation HBO Treatment Details Treatment Number: 32 Patient Type: Outpatient Chamber Type: Monoplace Chamber Serial #: [id number] Treatment Protocol: 2.5 ATA with 90 minutes oxygen, with two 5 minute air breaks Treatment Details Compression Rate Down: 2.0 psi / minute De-Compression Rate Up: 2.0 psi / minute Air breaks and CompressTx Pressure breathing periods DecompressDecompress Begins Reached (leave unused spaces Begins Ends blank) Chamber Pressure (ATA)1 2.5 2.5 2.5 2.5 2.5 --2.5 1 Clock Time (24 hr) 08:13 08:25 08:5509:0009:3009:35--10:05 10:17 Treatment Length: 124 (minutes) Treatment Segments: 4 Vital Signs Capillary Blood Glucose Reference Range: 80 - 120 mg / dl HBO Diabetic Blood Glucose Intervention Range: <131 mg/dl or >249 mg/dl Time Vitals Blood Respiratory Capillary Blood Glucose Pulse Action Type: Pulse: Temperature: Taken: Pressure: Rate: Glucose (mg/dl): Meter #: Oximetry (%) Taken: Pre 08:05 138/67 56 18 98.6 Post 10:20 160/76 54 15 98 Treatment Response Treatment Toleration: Well Treatment Completion Treatment Completed without Adverse Event Status: Chancy Smigiel Notes No concerns with treatment given Physician HBO Attestation: I certify that I supervised this HBO treatment in accordance with Medicare guidelines. A trained Yes Yes emergency response  team is readily available per hospital policies and procedures. Continue HBOT as ordered. Yes Electronic Signature(s) Signed: 12/03/2019 5:24:04 PM By: [name] MD Previous Signature: 12/03/2019 3:38:17 PM Version By: [name] EMT/HBOT Entered By: [name] on 12/03/2019 17:21:26 -------------------------------------------------------------------------------- HBO Safety Checklist Details Patient Name: Date of Service: [name], [name] 12/03/2019 8:00 AM Medical Record [id number] Patient Account Number: [id number] Date of Birth/Sex: Treating RN: 1945/10/02 (74 y.o. M) Primary Care Diante Barley: [name] Other Clinician: [name] Referring Sanye Ledesma: Treating Ashla Murph/Extender:[name], [name], [name] in Treatment: 8 HBO Safety Checklist Items Safety Checklist Consent Form Signed Patient voided / foley secured and emptied When did you last eato n/a Last dose of injectable or oral agent n/a NA Ostomy pouch emptied and vented if applicable NA All implantable devices assessed, documented and approved NA Intravenous access site secured and place Valuables secured Linens and cotton and cotton/polyester blend (less than 51% polyester) Personal oil-based products / skin lotions / body lotions removed NA Wigs or hairpieces removed NA Smoking or tobacco materials removed Books / newspapers / magazines / loose paper removed Cologne, aftershave, perfume and deodorant removed Jewelry removed (may wrap wedding band) NA Make-up removed Hair care products removed NA Battery operated devices (external) removed NA Heating patches and chemical warmers removed NA Titanium eyewear removed NA Nail polish cured greater than 10 hours NA Casting material cured greater than 10 hours NA Hearing aids removed NA Loose dentures or partials removed NA Prosthetics have been removed Patient demonstrates correct use of air break device (if applicable) Patient concerns  have been addressed Patient grounding bracelet on and cord attached to chamber Specifics for Inpatients (complete in addition to above) Medication sheet sent with patient Intravenous medications needed or due during therapy sent with patient Drainage tubes (e.g. nasogastric tube or chest tube secured and vented) Endotracheal or Tracheotomy tube secured Cuff deflated of air and inflated with saline Airway suctioned Electronic Signature(s)  Signed: 12/03/2019 8:21:10 AM By: [name] EMT/HBOT Entered By: [name] on 12/03/2019 08:21:09

## 2019-12-03 NOTE — Progress Notes (Signed)
JAYVION, KINSLER ([id number]) Visit Report for 12/03/2019 Arrival Information Details Patient Name: Date of Service: HULETT, GOLAN 12/03/2019 8:00 AM Medical Record [id number] Patient Account Number: [id number] Date of Birth/Sex: Treating RN: 12/06/1945 (74 y.o. M) Primary Care Dahmir Epperly: [name] Other Clinician: [name] Referring Lynford Espinoza: Treating Adler Alton/Extender:[name], [name], [name] in Treatment: 8 Visit Information History Since Last Visit Added or deleted any medications: No Patient Arrived: Ambulatory Any new allergies or adverse reactions: No Arrival Time: 08:00 Had a fall or experienced change in No Accompanied By: self activities of daily living that may affect Transfer Assistance: None risk of falls: Patient Identification Verified: Yes Signs or symptoms of abuse/neglect since last No Secondary Verification Process Yes visito Completed: Hospitalized since last visit: No Patient Requires Transmission-Based No Implantable device outside of the clinic excluding No Precautions: cellular tissue based products placed in the center Patient Has Alerts: No since last visit: Pain Present Now: No Electronic Signature(s) Signed: 12/03/2019 3:38:17 PM By: [name] EMT/HBOT Entered By: [name] on 12/03/2019 08:19:59 -------------------------------------------------------------------------------- Encounter Discharge Information Details Patient Name: Date of Service: ORIEN, KURTYKA 12/03/2019 8:00 AM Medical Record [id number] Patient Account Number: [id number] Date of Birth/Sex: Treating RN: November 01, 1945 (74 y.o. M) Primary Care Timonthy Hovater: [name] Other Clinician: [name] Referring Cheila Wickstrom: Treating Jlyn Cerros/Extender:[name], [name], [name] in Treatment: 8 Encounter Discharge Information Items Discharge Condition: Stable Ambulatory Status: Ambulatory Discharge Destination: Home Transportation:  Private Auto Accompanied By: self Schedule Follow-up Appointment: Yes Clinical Summary of Care: Patient Declined Electronic Signature(s) Signed: 12/03/2019 3:38:17 PM By: [name] EMT/HBOT Entered By: [name] on 12/03/2019 11:35:29 -------------------------------------------------------------------------------- Patient/Caregiver Education Details Patient Name: Date of Service: Agosto, Samul 4/5/2021andnbsp8:00 AM Medical Record [id number] Patient Account Number: [id number] Date of Birth/Gender: Treating RN: [birth year] (73 y.o. M) Primary Care Physician: [name] Other Clinician: [name] Referring Physician: Treating Physician/Extender:[name], [name], [name] in Treatment: 8 Education Assessment Education Provided To: Patient Education Topics Provided Hyperbaric Oxygenation: Methods: Explain/Verbal Responses: State content correctly Electronic Signature(s) Signed: 12/03/2019 3:38:17 PM By: [name] EMT/HBOT Entered By: [name] on 12/03/2019 11:35:15 -------------------------------------------------------------------------------- Vitals Details Patient Name: Date of Service: ALEXANDERJAMES, IDA 12/03/2019 8:00 AM Medical Record [id number] Patient Account Number: [id number] Date of Birth/Sex: Treating RN: 1946-04-10 (74 y.o. M) Primary Care Braylynn Ghan: [name] Other Clinician: [name] Referring Tenicia Gural: Treating Lashawnna Lambrecht/Extender:[name], [name], [name] in Treatment: 8 Vital Signs Time Taken: 08:05 Temperature (F): 98.6 Height (in): 72 Pulse (bpm): 56 Weight (lbs): 240 Respiratory Rate (breaths/min): 18 Body Mass Index (BMI): 32.5 Blood Pressure (mmHg): 138/67 Reference Range: 80 - 120 mg / dl Electronic Signature(s) Signed: 12/03/2019 3:38:17 PM By: [name] EMT/HBOT Entered By: [name] on 12/03/2019 08:20:13

## 2019-12-03 NOTE — Progress Notes (Signed)
MOZES, FONTAINE ([id number]) Visit Report for 11/27/2019 SuperBill Details Patient Name: Date of Service: TARI, JOINTER 11/27/2019 Medical Record [id number] Patient Account Number: [id number] Date of Birth/Sex: Treating RN: 1946/06/20 (73 y.o. 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.46 Personal history of malignant neoplasm of prostate Facility Procedures The patient participates with Medicare or their insurance follows the Medicare Facility Guidelines CPT4 Code Description Modifier Quantity [id number] G0277-(Facility Use Only) HBOT, full body chamber, 30min 4 Physician Procedures CPT4 Code Description Modifier Quantity [id number] - WC PHYS HYPERBARIC OXYGEN THERAPY 1 ICD-10 Diagnosis Description N30.41 Irradiation cystitis with hematuria Electronic Signature(s) Signed: 11/28/2019 4:28:52 PM By: [name] EMT/HBOT Signed: 12/03/2019 7:44:34 AM By: [name] MD Entered By: [name] on 11/27/2019 13:44:16

## 2019-12-04 ENCOUNTER — Encounter (HOSPITAL_BASED_OUTPATIENT_CLINIC_OR_DEPARTMENT_OTHER): Payer: Medicare Other | Admitting: Internal Medicine

## 2019-12-04 DIAGNOSIS — N3041 Irradiation cystitis with hematuria: Secondary | ICD-10-CM | POA: Diagnosis not present

## 2019-12-04 NOTE — Progress Notes (Addendum)
MATH, FINCK ([id number]) Visit Report for 12/04/2019 HBO Details Patient Name: Date of Service: [name], [name] 12/04/2019 8:00 AM Medical Record [id number] Patient Account Number: [id number] Date of Birth/Sex: Treating RN: August 23, 1946 (74 y.o. [name]) [name] Primary Care Laquilla Dault: [name] Other Clinician: [name] Referring Kateleen Encarnacion: Treating Dearis Danis/Extender:[name], [name], [name] in Treatment: 8 HBO Treatment Course Details Treatment Course Number: 1 Ordering Devetta Hagenow: [name] Total Treatments Ordered: 40 HBO Treatment Start Date: 10/15/2019 HBO Indication: Late Effect of Radiation HBO Treatment Details Treatment Number: 33 Patient Type: Outpatient Chamber Type: Monoplace Chamber Serial #: [id number] Treatment Protocol: 2.5 ATA with 90 minutes oxygen, with two 5 minute air breaks Treatment Details Compression Rate Down: 2.0 psi / minute De-Compression Rate Up: 2.0 psi / minute Air breaks and CompressTx Pressure breathing periods DecompressDecompress Begins Reached (leave unused spaces Begins Ends blank) Chamber Pressure (ATA)1 2.5 2.5 2.5 2.5 2.5 --2.5 1 Clock Time (24 hr) 08:03 08:15 08:4508:5009:2009:25--09:55 10:07 Treatment Length: 124 (minutes) Treatment Segments: 4 Vital Signs Capillary Blood Glucose Reference Range: 80 - 120 mg / dl HBO Diabetic Blood Glucose Intervention Range: <131 mg/dl or >249 mg/dl Time Vitals Blood Respiratory Capillary Blood Glucose Pulse Action Type: Pulse: Temperature: Taken: Pressure: Rate: Glucose (mg/dl): Meter #: Oximetry (%) Taken: Pre 08:00 128/70 56 17 98.4 Post 10:10 155/62 53 15 98.2 Treatment Response Treatment Toleration: Well Treatment Completion Treatment Completed without Adverse Event Status: Chala Gul Notes No concerns with treatment given Physician HBO Attestation: I certify that I supervised this HBO treatment in accordance with Medicare guidelines. A  trained Yes Yes emergency response team is readily available per hospital policies and procedures. Continue HBOT as ordered. Yes Electronic Signature(s) Signed: 12/04/2019 6:13:52 PM By: [name] MD Previous Signature: 12/04/2019 4:12:29 PM Version By: [name] EMT/HBOT Entered By: [name] on 12/04/2019 18:08:52 -------------------------------------------------------------------------------- HBO Safety Checklist Details Patient Name: Date of Service: [name] 12/04/2019 8:00 AM Medical Record [id number] Patient Account Number: [id number] Date of Birth/Sex: Treating RN: 10/25/1945 (74 y.o. [name]) [name] Primary Care Bethanie Bloxom: [name] Other Clinician: [name] Referring Torianne Laflam: Treating Madyn Ivins/Extender:[name], [name], [name] in Treatment: 8 HBO Safety Checklist Items Safety Checklist Consent Form Signed Patient voided / foley secured and emptied When did you last eato n/a Last dose of injectable or oral agent n/a NA Ostomy pouch emptied and vented if applicable NA All implantable devices assessed, documented and approved NA Intravenous access site secured and place Valuables secured Linens and cotton and cotton/polyester blend (less than 51% polyester) Personal oil-based products / skin lotions / body lotions removed NA Wigs or hairpieces removed NA Smoking or tobacco materials removed Books / newspapers / magazines / loose paper removed Cologne, aftershave, perfume and deodorant removed Jewelry removed (may wrap wedding band) NA Make-up removed Hair care products removed NA Battery operated devices (external) removed NA Heating patches and chemical warmers removed NA Titanium eyewear removed NA Nail polish cured greater than 10 hours NA Casting material cured greater than 10 hours NA Hearing aids removed NA Loose dentures or partials removed NA Prosthetics have been removed Patient demonstrates correct use of  air break device (if applicable) Patient concerns have been addressed Patient grounding bracelet on and cord attached to chamber Specifics for Inpatients (complete in addition to above) Medication sheet sent with patient Intravenous medications needed or due during therapy sent with patient Drainage tubes (e.g. nasogastric tube or chest tube secured and vented) Endotracheal or Tracheotomy tube secured Cuff deflated of air and inflated with saline  Airway suctioned Electronic Signature(s) Signed: 12/04/2019 8:48:20 AM By: [name] EMT/HBOT Entered By: [name] on 12/04/2019 08:48:20

## 2019-12-04 NOTE — Progress Notes (Signed)
JOHNTAVIUS, BRODIN ([id number]) Visit Report for 12/04/2019 Arrival Information Details Patient Name: Date of Service: PRESSLEY, DEVON 12/04/2019 8:00 AM Medical Record [id number] Patient Account Number: [id number] Date of Birth/Sex: Treating RN: Apr 26, 1946 (73 y.o. [name]) [name] Primary Care Eean Buss: [name] Other Clinician: [name] Referring Litzi Binning: Treating Kristilyn Coltrane/Extender:[name], [name], [name] in Treatment: 8 Visit Information History Since Last Visit Added or deleted any medications: No Patient Arrived: Ambulatory Any new allergies or adverse reactions: No Arrival Time: 07:55 Had a fall or experienced change in No Accompanied By: self activities of daily living that may affect Transfer Assistance: None risk of falls: Patient Identification Verified: Yes Signs or symptoms of abuse/neglect since last No Secondary Verification Process Yes visito Completed: Hospitalized since last visit: No Patient Requires Transmission-Based No Implantable device outside of the clinic excluding No Precautions: cellular tissue based products placed in the center Patient Has Alerts: No since last visit: Pain Present Now: No Electronic Signature(s) Signed: 12/04/2019 4:12:29 PM By: [name] EMT/HBOT Entered By: [name] on 12/04/2019 08:45:52 -------------------------------------------------------------------------------- Encounter Discharge Information Details Patient Name: Date of Service: QUINSHAWN, SHONG 12/04/2019 8:00 AM Medical Record [id number] Patient Account Number: [id number] Date of Birth/Sex: Treating RN: 07-Dec-1945 (73 y.o. [name]) [name] Primary Care Kayla Deshaies: [name] Other Clinician: [name] Referring Medhansh Brinkmeier: Treating Kamsiyochukwu Spickler/Extender:[name], [name], [name] in Treatment: 8 Encounter Discharge Information Items Discharge Condition: Stable Ambulatory Status: Ambulatory Discharge Destination:  Home Transportation: Private Auto Accompanied By: self Schedule Follow-up Appointment: Yes Clinical Summary of Care: Patient Declined Electronic Signature(s) Signed: 12/04/2019 4:12:29 PM By: [name] EMT/HBOT Entered By: [name] on 12/04/2019 12:09:08 -------------------------------------------------------------------------------- Patient/Caregiver Education Details Patient Name: Date of Service: Booz, Wade 4/6/2021andnbsp8:00 AM Medical Record [id number] Patient Account Number: [id number] Date of Birth/Gender: Treating RN: [birth year] (73 y.o. Oval Linsey Primary Care Physician: [name] Other Clinician: [name] Referring Physician: Treating Physician/Extender:[name], [name], [name] in Treatment: 8 Education Assessment Education Provided To: Patient Education Topics Provided Hyperbaric Oxygenation: Methods: Explain/Verbal Responses: State content correctly Electronic Signature(s) Signed: 12/04/2019 4:12:29 PM By: [name] EMT/HBOT Entered By: [name] on 12/04/2019 12:08:57 -------------------------------------------------------------------------------- Vitals Details Patient Name: Date of Service: [name] 12/04/2019 8:00 AM Medical Record [id number] Patient Account Number: [id number] Date of Birth/Sex: Treating RN: [birth year] (73 y.o. [name]) [name] Primary Care Eulas Schweitzer: [name] Other Clinician: [name] Referring Frida Wahlstrom: Treating Armaan Pond/Extender:[name], [name], [name] in Treatment: 8 Vital Signs Time Taken: 08:00 Temperature (F): 98.4 Height (in): 72 Pulse (bpm): 56 Weight (lbs): 240 Respiratory Rate (breaths/min): 17 Body Mass Index (BMI): 32.5 Blood Pressure (mmHg): 128/70 Reference Range: 80 - 120 mg / dl Electronic Signature(s) Signed: 12/04/2019 4:12:29 PM By: [name] EMT/HBOT Entered By: [name] on 12/04/2019 08:46:07

## 2019-12-05 ENCOUNTER — Encounter (HOSPITAL_BASED_OUTPATIENT_CLINIC_OR_DEPARTMENT_OTHER): Payer: Medicare Other | Admitting: Physician Assistant

## 2019-12-05 NOTE — Progress Notes (Signed)
ULISSES, RABINOWITZ ([id number]) Visit Report for 11/30/2019 SuperBill Details Patient Name: Date of Service: [name], [name] 11/30/2019 Medical Record [id number] Patient Account Number: [id number] Date of Birth/Sex: Treating RN: November 24, 1945 (74 y.o. 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 Encounter for antineoplastic radiation therapy Z85.46 Personal history of malignant neoplasm of prostate Facility Procedures The patient participates with Medicare or their insurance follows the Medicare Facility Guidelines CPT4 Code Description Modifier Quantity [id number] G0277-(Facility Use Only) HBOT, full body chamber, 67min 4 Physician Procedures CPT4 Code Description Modifier Quantity [id number] - WC PHYS HYPERBARIC OXYGEN THERAPY 1 ICD-10 Diagnosis Description N30.41 Irradiation cystitis with hematuria Electronic Signature(s) Signed: 12/03/2019 3:38:17 PM By: [name] EMT/HBOT Signed: 12/05/2019 7:02:55 PM By: Worthy Keeler PA-C Entered By: [name] on 11/30/2019 16:57:34

## 2019-12-05 NOTE — Progress Notes (Signed)
[name], [name] ([id number]) Visit Report for 12/04/2019 SuperBill Details Patient Name: Date of Service: JULIANO, HEPBURN 12/04/2019 Medical Record [id number] Patient Account Number: [id number] Date of Birth/Sex: Treating RN: Jul 05, 1946 (73 y.o. 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 Encounter for antineoplastic radiation therapy Z85.46 Personal history of malignant neoplasm of prostate Facility Procedures The patient participates with Medicare or their insurance follows the Medicare Facility Guidelines CPT4 Code Description Modifier Quantity [id number] G0277-(Facility Use Only) HBOT, full body chamber, 73min 4 Physician Procedures CPT4 Code Description Modifier Quantity [id number] - WC PHYS HYPERBARIC OXYGEN THERAPY 1 ICD-10 Diagnosis Description N30.41 Irradiation cystitis with hematuria Electronic Signature(s) Signed: 12/04/2019 4:12:29 PM By: [name] EMT/HBOT Signed: 12/04/2019 6:13:52 PM By: [name] MD Entered By: [name] on 12/04/2019 12:08:43

## 2019-12-06 ENCOUNTER — Other Ambulatory Visit: Payer: Self-pay

## 2019-12-06 ENCOUNTER — Encounter (HOSPITAL_BASED_OUTPATIENT_CLINIC_OR_DEPARTMENT_OTHER): Payer: Medicare Other | Admitting: Internal Medicine

## 2019-12-06 DIAGNOSIS — N3041 Irradiation cystitis with hematuria: Secondary | ICD-10-CM | POA: Diagnosis not present

## 2019-12-06 NOTE — Progress Notes (Addendum)
VIVAAN, BUSBIN ([id number]) Visit Report for 12/06/2019 HBO Details Patient Name: Date of Service: [name], [name] 12/06/2019 8:00 AM Medical Record [id number] Patient Account Number: [id number] Date of Birth/Sex: Treating RN: 1946-02-13 (74 y.o. [name], [name] Primary Care Eddith Mentor: [name] Other Clinician: [name] Referring Carollyn Etcheverry: Treating Olis Viverette/Extender:[name], [name], [name] in Treatment: 8 HBO Treatment Course Details Treatment Course Number: 1 Ordering Trellis Vanoverbeke: [name] Total Treatments Ordered: 40 HBO Treatment Start Date: 10/15/2019 HBO Indication: Late Effect of Radiation HBO Treatment Details Treatment Number: 34 Patient Type: Outpatient Chamber Type: Monoplace Chamber Serial #: [id number] Treatment Protocol: 2.5 ATA with 90 minutes oxygen, with two 5 minute air breaks Treatment Details Compression Rate Down: 2.0 psi / minute De-Compression Rate Up: 2.0 psi / minute Air breaks and CompressTx Pressure breathing periods DecompressDecompress Begins Reached (leave unused spaces Begins Ends blank) Chamber Pressure (ATA)1 2.5 2.5 2.5 2.5 2.5 --2.5 1 Clock Time (24 hr) 08:07 08:19 [id number] 10:11 Treatment Length: 124 (minutes) Treatment Segments: 4 Vital Signs Capillary Blood Glucose Reference Range: 80 - 120 mg / dl HBO Diabetic Blood Glucose Intervention Range: <131 mg/dl or >249 mg/dl Time Vitals Blood Respiratory Capillary Blood Glucose Pulse Action Type: Pulse: Temperature: Taken: Pressure: Rate: Glucose (mg/dl): Meter #: Oximetry (%) Taken: Pre 08:05 137/73 54 16 97.6 Post 10:13 152/69 51 14 98.2 Treatment Response Treatment Toleration: Well Treatment Completion Treatment Completed without Adverse Event Status: [name] Notes No concerns with treatment given Physician HBO Attestation: I certify that I supervised this HBO treatment in accordance with Medicare guidelines. A  trained Yes Yes emergency response team is readily available per hospital policies and procedures. Continue HBOT as ordered. Yes Electronic Signature(s) Signed: 12/06/2019 5:58:56 PM By: [name] MD Previous Signature: 12/06/2019 4:38:35 PM Version By: [name] EMT/HBOT Entered By: [name] on 12/06/2019 17:55:52 -------------------------------------------------------------------------------- HBO Safety Checklist Details Patient Name: Date of Service: [name], [name] 12/06/2019 8:00 AM Medical Record [id number] Patient Account Number: [id number] Date of Birth/Sex: Treating RN: 1946-03-16 (74 y.o. M) Primary Care Bryley Chrisman: [name] Other Clinician: [name] Referring Demarko Zeimet: Treating Jahziah Simonin/Extender:[name], [name], [name] in Treatment: 8 HBO Safety Checklist Items Safety Checklist Consent Form Signed Patient voided / foley secured and emptied When did you last eato n/a Last dose of injectable or oral agent n/a NA Ostomy pouch emptied and vented if applicable NA All implantable devices assessed, documented and approved NA Intravenous access site secured and place Valuables secured Linens and cotton and cotton/polyester blend (less than 51% polyester) Personal oil-based products / skin lotions / body lotions removed NA Wigs or hairpieces removed NA Smoking or tobacco materials removed Books / newspapers / magazines / loose paper removed Cologne, aftershave, perfume and deodorant removed Jewelry removed (may wrap wedding band) NA Make-up removed Hair care products removed NA Battery operated devices (external) removed NA Heating patches and chemical warmers removed NA Titanium eyewear removed NA Nail polish cured greater than 10 hours NA Casting material cured greater than 10 hours NA Hearing aids removed NA Loose dentures or partials removed NA Prosthetics have been removed Patient demonstrates correct use of air break  device (if applicable) Patient concerns have been addressed Patient grounding bracelet on and cord attached to chamber Specifics for Inpatients (complete in addition to above) Medication sheet sent with patient Intravenous medications needed or due during therapy sent with patient Drainage tubes (e.g. nasogastric tube or chest tube secured and vented) Endotracheal or Tracheotomy tube secured Cuff deflated of air and inflated with saline Airway suctioned  Electronic Signature(s) Signed: 12/06/2019 8:15:03 AM By: [name] EMT/HBOT Entered By: [name] on 12/06/2019 08:15:03

## 2019-12-06 NOTE — Progress Notes (Signed)
[name], [name] ([id number]) Visit Report for 12/06/2019 SuperBill Details Patient Name: Date of Service: [name], [name] 12/06/2019 Medical Record [id number] Patient Account Number: [id number] Date of Birth/Sex: Treating RN: 31-Dec-1945 (74 y.o. 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

## 2019-12-06 NOTE — Progress Notes (Signed)
ARDA, CESAR ([id number]) Visit Report for 12/06/2019 Arrival Information Details Patient Name: Date of Service: [name], [name] 12/06/2019 8:00 AM Medical Record [id number] Patient Account Number: [id number] Date of Birth/Sex: Treating RN: Feb 21, 1946 (74 y.o. M) Primary Care Glen Blatchley: [name] Other Clinician: [name] Referring Davontay Watlington: Treating Loriel Diehl/Extender:[name], [name], [name] in Treatment: 8 Visit Information History Since Last Visit Added or deleted any medications: No Patient Arrived: Ambulatory Any new allergies or adverse reactions: No Arrival Time: 08:00 Had a fall or experienced change in No Accompanied By: self activities of daily living that may affect Transfer Assistance: None risk of falls: Patient Identification Verified: Yes Signs or symptoms of abuse/neglect since last No Secondary Verification Process Yes visito Completed: Hospitalized since last visit: No Patient Requires Transmission-Based No Implantable device outside of the clinic excluding No Precautions: cellular tissue based products placed in the center Patient Has Alerts: No since last visit: Pain Present Now: No Electronic Signature(s) Signed: 12/06/2019 4:38:35 PM By: [name] EMT/HBOT Entered By: [name] on 12/06/2019 08:13:56 -------------------------------------------------------------------------------- Encounter Discharge Information Details Patient Name: Date of Service: [name], [name] 12/06/2019 8:00 AM Medical Record [id number] Patient Account Number: [id number] Date of Birth/Sex: Treating RN: 1946/05/23 (74 y.o. [name] Primary Care Donella Pascarella: [name] Other Clinician: [name] Referring Maimuna Leaman: Treating Lajoya Dombek/Extender:[name], [name], [name] in Treatment: 8 Encounter Discharge Information Items Discharge Condition: Stable Ambulatory Status: Ambulatory Discharge Destination:  Home Transportation: Private Auto Accompanied By: self Schedule Follow-up Appointment: Yes Clinical Summary of Care: Patient Declined Electronic Signature(s) Signed: 12/06/2019 4:38:35 PM By: [name] EMT/HBOT Entered By: [name] on 12/06/2019 10:51:38 -------------------------------------------------------------------------------- Patient/Caregiver Education Details Patient Name: Date of Service: [name], [name] 4/8/2021andnbsp8:00 AM Medical Record [id number] Patient Account Number: [id number] Date of Birth/Gender: Treating RN: [birth year] (73 y.o. [name] Primary Care Physician: [name] Other Clinician: [name] Referring Physician: Treating Physician/Extender:[name], [name], [name] in Treatment: 8 Education Assessment Education Provided To: Patient Education Topics Provided Hyperbaric Oxygenation: Methods: Explain/Verbal Responses: State content correctly Electronic Signature(s) Signed: 12/06/2019 4:38:35 PM By: [name] EMT/HBOT Entered By: [name] on 12/06/2019 10:51:27 -------------------------------------------------------------------------------- Vitals Details Patient Name: Date of Service: [name], [name] 12/06/2019 8:00 AM Medical Record [id number] Patient Account Number: [id number] Date of Birth/Sex: Treating RN: 1945-11-05 (74 y.o. M) Primary Care Ziyon Soltau: [name] Other Clinician: [name] Referring Azaylea Maves: Treating Kailand Seda/Extender:[name], [name], [name] in Treatment: 8 Vital Signs Time Taken: 08:05 Temperature (F): 97.6 Height (in): 72 Pulse (bpm): 54 Weight (lbs): 240 Respiratory Rate (breaths/min): 16 Body Mass Index (BMI): 32.5 Blood Pressure (mmHg): 137/73 Reference Range: 80 - 120 mg / dl Electronic Signature(s) Signed: 12/06/2019 4:38:35 PM By: [name] EMT/HBOT Entered By: [name] on 12/06/2019 08:14:14

## 2019-12-07 ENCOUNTER — Encounter (HOSPITAL_BASED_OUTPATIENT_CLINIC_OR_DEPARTMENT_OTHER): Payer: Medicare Other | Admitting: Internal Medicine

## 2019-12-07 DIAGNOSIS — N3041 Irradiation cystitis with hematuria: Secondary | ICD-10-CM | POA: Diagnosis not present

## 2019-12-07 NOTE — Progress Notes (Addendum)
TAVARRIS, MCPARTLIN ([id number]) Visit Report for 12/07/2019 HBO Details Patient Name: Date of Service: [name], [name] 12/07/2019 8:00 AM Medical Record [id number] Patient Account Number: [id number] Date of Birth/Sex: Treating RN: 05-28-1946 (74 y.o. M) Primary Care Lincy Belles: [name] Other Clinician: [name] Referring Robbye Dede: Treating Theo Reither/Extender:[name], [name], [name] in Treatment: 9 HBO Treatment Course Details Treatment Course Number: 1 Ordering Maram Bently: [name] Total Treatments Ordered: 40 HBO Treatment Start Date: 10/15/2019 HBO Indication: Late Effect of Radiation HBO Treatment Details Treatment Number: 35 Patient Type: Outpatient Chamber Type: Monoplace Chamber Serial #: [id number] Treatment Protocol: 2.5 ATA with 90 minutes oxygen, with two 5 minute air breaks Treatment Details Compression Rate Down: 2.0 psi / minute De-Compression Rate Up: 2.0 psi / minute Air breaks and CompressTx Pressure breathing periods DecompressDecompress Begins Reached (leave unused spaces Begins Ends blank) Chamber Pressure (ATA)1 2.5 2.5 2.5 2.5 2.5 --2.5 1 Clock Time (24 hr) 07:59 08:11 [id number] 10:03 Treatment Length: 124 (minutes) Treatment Segments: 4 Vital Signs Capillary Blood Glucose Reference Range: 80 - 120 mg / dl HBO Diabetic Blood Glucose Intervention Range: <131 mg/dl or >249 mg/dl Time Vitals Blood Respiratory Capillary Blood Glucose Pulse Action Type: Pulse: Temperature: Taken: Pressure: Rate: Glucose (mg/dl): Meter #: Oximetry (%) Taken: Pre 07:55 133/60 56 14 98.3 Post 10:05 141/66 51 15 98.4 Treatment Response Treatment Toleration: Well Treatment Completion Treatment Completed without Adverse Event Status: Chrsitopher Wik Notes no concerns or treatment given Physician HBO Attestation: I certify that I supervised this HBO treatment in accordance with Medicare guidelines. A trained Yes Yes emergency response  team is readily available per hospital policies and procedures. Continue HBOT as ordered. Yes Electronic Signature(s) Signed: 12/10/2019 9:07:11 AM By: [name] MD Previous Signature: 12/07/2019 4:05:23 PM Version By: [name] EMT/HBOT Entered By: [name] on 12/09/2019 08:55:00 -------------------------------------------------------------------------------- HBO Safety Checklist Details Patient Name: Date of Service: [name], [name] 12/07/2019 8:00 AM Medical Record [id number] Patient Account Number: [id number] Date of Birth/Sex: Treating RN: 1946-06-09 (74 y.o. M) Primary Care Daunte Oestreich: [name] Other Clinician: [name] Referring Analeia Ismael: Treating Trine Fread/Extender:[name], [name], [name] in Treatment: 9 HBO Safety Checklist Items Safety Checklist Consent Form Signed Patient voided / foley secured and emptied When did you last eato n/a Last dose of injectable or oral agent n/a NA Ostomy pouch emptied and vented if applicable NA All implantable devices assessed, documented and approved NA Intravenous access site secured and place Valuables secured Linens and cotton and cotton/polyester blend (less than 51% polyester) Personal oil-based products / skin lotions / body lotions removed NA Wigs or hairpieces removed NA Smoking or tobacco materials removed Books / newspapers / magazines / loose paper removed Cologne, aftershave, perfume and deodorant removed Jewelry removed (may wrap wedding band) NA Make-up removed Hair care products removed NA Battery operated devices (external) removed NA Heating patches and chemical warmers removed NA Titanium eyewear removed NA Nail polish cured greater than 10 hours NA Casting material cured greater than 10 hours NA Hearing aids removed NA Loose dentures or partials removed NA Prosthetics have been removed Patient demonstrates correct use of air break device (if applicable) Patient concerns  have been addressed Patient grounding bracelet on and cord attached to chamber Specifics for Inpatients (complete in addition to above) Medication sheet sent with patient Intravenous medications needed or due during therapy sent with patient Drainage tubes (e.g. nasogastric tube or chest tube secured and vented) Endotracheal or Tracheotomy tube secured Cuff deflated of air and inflated with saline Airway suctioned Electronic Signature(s)  Signed: 12/07/2019 8:02:57 AM By: [name] EMT/HBOT Entered By: [name] on 12/07/2019 08:02:57

## 2019-12-07 NOTE — Progress Notes (Signed)
[name], [name] ([id number]) Visit Report for 12/07/2019 Arrival Information Details Patient Name: Date of Service: [name], [name] 12/07/2019 8:00 AM Medical Record [id number] Patient Account Number: [id number] Date of Birth/Sex: Treating RN: Oct 10, 1945 (74 y.o. M) Primary Care Travonne Schowalter: [name] Other Clinician: [name] Referring Rhylin Venters: Treating Suleima Ohlendorf/Extender:[name], [name], [name] in Treatment: 9 Visit Information History Since Last Visit Added or deleted any medications: No Patient Arrived: Ambulatory Any new allergies or adverse reactions: No Arrival Time: 07:50 Had a fall or experienced change in No Accompanied By: self activities of daily living that may affect Transfer Assistance: None risk of falls: Patient Identification Verified: Yes Signs or symptoms of abuse/neglect since last No Secondary Verification Process Yes visito Completed: Hospitalized since last visit: No Patient Requires Transmission-Based No Implantable device outside of the clinic excluding No Precautions: cellular tissue based products placed in the center Patient Has Alerts: No since last visit: Pain Present Now: No Electronic Signature(s) Signed: 12/07/2019 4:05:23 PM By: [name] EMT/HBOT Entered By: [name] on 12/07/2019 08:02:08 -------------------------------------------------------------------------------- Encounter Discharge Information Details Patient Name: Date of Service: [name], [name] 12/07/2019 8:00 AM Medical Record [id number] Patient Account Number: [id number] Date of Birth/Sex: Treating RN: [birth year] (74 y.o. M) Primary Care Eila Runyan: [name] Other Clinician: [name] Referring Deshone Lyssy: Treating Maddyn Lieurance/Extender:[name], [name], [name] in Treatment: 9 Encounter Discharge Information Items Discharge Condition: Stable Ambulatory Status: Ambulatory Discharge Destination: Home Transportation:  Private Auto Accompanied By: self Schedule Follow-up Appointment: Yes Clinical Summary of Care: Patient Declined Electronic Signature(s) Signed: 12/07/2019 4:05:23 PM By: [name] EMT/HBOT Entered By: [name] on 12/07/2019 13:34:35 -------------------------------------------------------------------------------- Patient/Caregiver Education Details Patient Name: Date of Service: [name], [name] 4/9/2021andnbsp8:00 AM Medical Record [id number] Patient Account Number: [id number] Date of Birth/Gender: Treating RN: 09/05/1945 (73 y.o. M) Primary Care Physician: [name] Other Clinician: [name] Referring Physician: Treating Physician/Extender:[name], [name], [name] in Treatment: 9 Education Assessment Education Provided To: Patient Education Topics Provided Hyperbaric Oxygenation: Methods: Explain/Verbal Responses: State content correctly Electronic Signature(s) Signed: 12/07/2019 4:05:23 PM By: [name] EMT/HBOT Entered By: [name] on 12/07/2019 13:34:15 -------------------------------------------------------------------------------- Vitals Details Patient Name: Date of Service: [name], [name] 12/07/2019 8:00 AM Medical Record [id number] Patient Account Number: [id number] Date of Birth/Sex: Treating RN: August 02, 1946 (74 y.o. M) Primary Care Madonna Flegal: [name] Other Clinician: [name] Referring Jakiera Ehler: Treating Matti Minney/Extender:[name], [name], [name] in Treatment: 9 Vital Signs Time Taken: 07:55 Temperature (F): 98.3 Height (in): 72 Pulse (bpm): 56 Weight (lbs): 240 Respiratory Rate (breaths/min): 14 Body Mass Index (BMI): 32.5 Blood Pressure (mmHg): 133/60 Reference Range: 80 - 120 mg / dl Electronic Signature(s) Signed: 12/07/2019 4:05:23 PM By: [name] EMT/HBOT Entered By: [name] on 12/07/2019 08:02:21

## 2019-12-10 ENCOUNTER — Encounter (HOSPITAL_BASED_OUTPATIENT_CLINIC_OR_DEPARTMENT_OTHER): Payer: Medicare Other | Admitting: Internal Medicine

## 2019-12-10 ENCOUNTER — Other Ambulatory Visit: Payer: Self-pay

## 2019-12-10 DIAGNOSIS — N3041 Irradiation cystitis with hematuria: Secondary | ICD-10-CM | POA: Diagnosis not present

## 2019-12-10 NOTE — Progress Notes (Signed)
[name], [name] ([id number]) Visit Report for 12/10/2019 Arrival Information Details Patient Name: Date of Service: [name], [name] 12/10/2019 8:00 AM Medical Record [id number] Patient Account Number: [id number] Date of Birth/Sex: Treating RN: 01-01-1946 (74 y.o. [name], [name] Primary Care Ahniya Mitchum: [name] Other Clinician: [name] Referring Celie Desrochers: Treating Jadin Kagel/Extender:[name], [name], [name] in Treatment: 9 Visit Information History Since Last Visit Added or deleted any medications: No Patient Arrived: Ambulatory Any new allergies or adverse reactions: No Arrival Time: 08:00 Had a fall or experienced change in No Accompanied By: self activities of daily living that may affect Transfer Assistance: None risk of falls: Patient Identification Verified: Yes Signs or symptoms of abuse/neglect since last No Secondary Verification Process Yes visito Completed: Hospitalized since last visit: No Patient Requires Transmission-Based No Implantable device outside of the clinic excluding No Precautions: cellular tissue based products placed in the center Patient Has Alerts: No since last visit: Pain Present Now: No Electronic Signature(s) Signed: 12/10/2019 4:10:46 PM By: [name] EMT/HBOT Entered By: [name] on 12/10/2019 08:46:06 -------------------------------------------------------------------------------- Encounter Discharge Information Details Patient Name: Date of Service: [name], [name] 12/10/2019 8:00 AM Medical Record [id number] Patient Account Number: [id number] Date of Birth/Sex: Treating RN: May 14, 1946 (74 y.o. [name] Primary Care Dianne Whelchel: [name] Other Clinician: [name] Referring Evian Salguero: Treating Renzo Vincelette/Extender:[name], [name], [name] in Treatment: 9 Encounter Discharge Information Items Discharge Condition: Stable Ambulatory Status: Ambulatory Discharge  Destination: Home Transportation: Private Auto Accompanied By: self Schedule Follow-up Appointment: Yes Clinical Summary of Care: Patient Declined Electronic Signature(s) Signed: 12/10/2019 4:10:46 PM By: [name] EMT/HBOT Entered By: [name] on 12/10/2019 13:48:30 -------------------------------------------------------------------------------- Patient/Caregiver Education Details Patient Name: Date of Service: [name], [name] 4/12/2021andnbsp8:00 AM Medical Record [id number] Patient Account Number: [id number] Date of Birth/Gender: Treating RN: April 06, 1946 (73 y.o. [name] Primary Care Physician: [name] Other Clinician: [name] Referring Physician: Treating Physician/Extender:[name], [name], [name] in Treatment: 9 Education Assessment Education Provided To: Patient Education Topics Provided Hyperbaric Oxygenation: Methods: Explain/Verbal Responses: State content correctly Electronic Signature(s) Signed: 12/10/2019 4:10:46 PM By: [name] EMT/HBOT Entered By: [name] on 12/10/2019 13:44:05 -------------------------------------------------------------------------------- Vitals Details Patient Name: Date of Service: [name], [name] 12/10/2019 8:00 AM Medical Record [id number] Patient Account Number: [id number] Date of Birth/Sex: Treating RN: 1945-09-12 (74 y.o. [name] Primary Care Jene Oravec: [name] Other Clinician: [name] Referring Edeline Greening: Treating Nettie Cromwell/Extender:[name], [name], [name] in Treatment: 9 Vital Signs Time Taken: 08:05 Temperature (F): 98.7 Height (in): 72 Pulse (bpm): 50 Weight (lbs): 240 Respiratory Rate (breaths/min): 16 Body Mass Index (BMI): 32.5 Blood Pressure (mmHg): 138/90 Reference Range: 80 - 120 mg / dl Electronic Signature(s) Signed: 12/10/2019 4:10:46 PM By: [name] EMT/HBOT Entered By: [name] on 12/10/2019  08:46:22

## 2019-12-10 NOTE — Progress Notes (Addendum)
[name], [name] ([id number]) Visit Report for 12/10/2019 HBO Details Patient Name: Date of Service: [name], [name] 12/10/2019 8:00 AM Medical Record [id number] Patient Account Number: [id number] Date of Birth/Sex: Treating RN: [birth year] (74 y.o. [name] Primary Care Ione Sandus[place]: [name] Other Clinician: Mikeal [name] Referring [name]: Treating [name]/Extender:[name], [name] Sheldon, [name] in Treatment: 9 HBO Treatment Course Details Treatment Course Number: 1 Ordering [name]: [name] Total Treatments Ordered: 40 HBO Treatment Start Date: 10/15/2019 HBO Indication: Late Effect of Radiation HBO Treatment Details Treatment Number: 36 Patient Type: Outpatient Chamber Type: Monoplace Chamber Serial #: [id number] Treatment Protocol: 2.5 ATA with 90 minutes oxygen, with two 5 minute air breaks Treatment Details Compression Rate Down: 2.0 psi / minute De-Compression Rate Up: 2.0 psi / minute Air breaks and CompressTx Pressure breathing periods DecompressDecompress Begins Reached (leave unused spaces Begins Ends blank) Chamber Pressure (ATA)1 2.5 2.5 2.5 2.5 2.5 --2.5 1 Clock Time (24 hr) 08:15 08:27 [id number] 10:19 Treatment Length: 124 (minutes) Treatment Segments: 4 Vital Signs Capillary Blood Glucose Reference Range: 80 - 120 mg / dl HBO Diabetic Blood Glucose Intervention Range: <131 mg/dl or >249 mg/dl Time Vitals Blood Respiratory Capillary Blood Glucose Pulse Action Type: Pulse: Temperature: Taken: Pressure: Rate: Glucose (mg/dl): Meter #: Oximetry (%) Taken: Pre 08:05 138/90 50 16 98.7 Post 10:21 156/75 53 14 98.4 Treatment Response Treatment Toleration: Well Treatment Completion Treatment Completed without Adverse Event Status: [name] Notes Continue treatment for radiation cystitis. Patient tolerated treatment well Physician HBO Attestation: I certify that I supervised this HBO treatment in accordance with  Medicare guidelines. A trained Yes Yes emergency response team is readily available per hospital policies and procedures. Continue HBOT as ordered. Yes Electronic Signature(s) Signed: 12/11/2019 5:51:00 PM By: [name] Ham MD Previous Signature: 12/10/2019 4:10:46 PM Version By: [name] EMT/HBOT Entered By: [name] on 12/10/2019 18:02:24 -------------------------------------------------------------------------------- HBO Safety Checklist Details Patient Name: Date of Service: [name], [name] 12/10/2019 8:00 AM Medical Record [id number] Patient Account Number: [id number] Date of Birth/Sex: Treating RN: March 11, 1946 (74 y.o. [name], [name] Primary Care [name]: [name] Other Clinician: Mikeal [name] Referring [name]: Treating [name]/Extender:[name], [name] Sheldon, [name] in Treatment: 9 HBO Safety Checklist Items Safety Checklist Consent Form Signed Patient voided / foley secured and emptied When did you last eato n/a Last dose of injectable or oral agent n/a NA Ostomy pouch emptied and vented if applicable NA All implantable devices assessed, documented and approved NA Intravenous access site secured and place Valuables secured Linens and cotton and cotton/polyester blend (less than 51% polyester) Personal oil-based products / skin lotions / body lotions removed NA Wigs or hairpieces removed NA Smoking or tobacco materials removed Books / newspapers / magazines / loose paper removed Cologne, aftershave, perfume and deodorant removed Jewelry removed (may wrap wedding band) NA Make-up removed Hair care products removed NA Battery operated devices (external) removed NA Heating patches and chemical warmers removed NA Titanium eyewear removed NA Nail polish cured greater than 10 hours NA Casting material cured greater than 10 hours NA Hearing aids removed NA Loose dentures or partials removed NA Prosthetics have been removed Patient  demonstrates correct use of air break device (if applicable) Patient concerns have been addressed Patient grounding bracelet on and cord attached to chamber Specifics for Inpatients (complete in addition to above) Medication sheet sent with patient Intravenous medications needed or due during therapy sent with patient Drainage tubes (e.g. nasogastric tube or chest tube secured and vented) Endotracheal or Tracheotomy tube secured Cuff deflated of air  and inflated with saline Airway suctioned Electronic Signature(s) Signed: 12/10/2019 8:47:21 AM By: [name] EMT/HBOT Entered By: [name] on 12/10/2019 08:47:21

## 2019-12-10 NOTE — Progress Notes (Signed)
CURVIN, HAGGAN ([id number]) Visit Report for 12/07/2019 SuperBill Details Patient Name: Date of Service: [name], [name] 12/07/2019 Medical Record [id number] Patient Account Number: [id number] Date of Birth/Sex: Treating RN: 27-Aug-1946 (74 y.o. 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.0 Encounter for antineoplastic radiation therapy Z85.46 Personal history of malignant neoplasm of prostate Facility Procedures The patient participates with Medicare or their insurance follows the Medicare Facility Guidelines CPT4 Code Description Modifier Quantity [id number] G0277-(Facility Use Only) HBOT, full body chamber, 50min 4 Physician Procedures CPT4 Code Description Modifier Quantity [id number] - WC PHYS HYPERBARIC OXYGEN THERAPY 1 ICD-10 Diagnosis Description N30.41 Irradiation cystitis with hematuria Electronic Signature(s) Signed: 12/07/2019 4:05:23 PM By: [name] EMT/HBOT Signed: 12/10/2019 9:07:11 AM By: [name] MD Entered By: [name] on 12/07/2019 13:34:01

## 2019-12-11 ENCOUNTER — Encounter (HOSPITAL_BASED_OUTPATIENT_CLINIC_OR_DEPARTMENT_OTHER): Payer: Medicare Other | Admitting: Internal Medicine

## 2019-12-11 DIAGNOSIS — N3041 Irradiation cystitis with hematuria: Secondary | ICD-10-CM | POA: Diagnosis not present

## 2019-12-11 NOTE — Progress Notes (Signed)
GUINNESS, GOELZ ([id number]) Visit Report for 12/11/2019 Arrival Information Details Patient Name: Date of Service: [name], [name] 12/11/2019 8:00 AM Medical Record [id number] Patient Account Number: [id number] Date of Birth/Sex: Treating RN: 29-Apr-1946 (74 y.o. [name], [name] Primary Care Kristie Bracewell: [name] Other Clinician: [name] Referring Tucker Minter: Treating Tyreona Panjwani/Extender:[name], [name], [name] in Treatment: 9 Visit Information History Since Last Visit Added or deleted any medications: No Patient Arrived: Ambulatory Any new allergies or adverse reactions: No Arrival Time: 07:45 Had a fall or experienced change in No Accompanied By: self activities of daily living that may affect Transfer Assistance: None risk of falls: Patient Identification Verified: Yes Signs or symptoms of abuse/neglect since last No Secondary Verification Process Yes visito Completed: Hospitalized since last visit: No Patient Requires Transmission-Based No Implantable device outside of the clinic excluding No Precautions: cellular tissue based products placed in the center Patient Has Alerts: No since last visit: Pain Present Now: No Electronic Signature(s) Signed: 12/11/2019 3:51:35 PM By: [name] EMT/HBOT Entered By: [name] on 12/11/2019 09:00:41 -------------------------------------------------------------------------------- Encounter Discharge Information Details Patient Name: Date of Service: [name], [name] 12/11/2019 8:00 AM Medical Record [id number] Patient Account Number: [id number] Date of Birth/Sex: Treating RN: 21-Sep-1945 (74 y.o. [name] Primary Care Saraiya Kozma: [name] Other Clinician: [name] Referring Akhila Mahnken: Treating Flint Hakeem/Extender:[name], [name], [name] in Treatment: 9 Encounter Discharge Information Items Discharge Condition: Stable Ambulatory Status: Ambulatory Discharge  Destination: Home Transportation: Private Auto Accompanied By: self Schedule Follow-up Appointment: Yes Clinical Summary of Care: Patient Declined Electronic Signature(s) Signed: 12/11/2019 3:51:35 PM By: [name] EMT/HBOT Entered By: [name] on 12/11/2019 13:35:14 -------------------------------------------------------------------------------- Patient/Caregiver Education Details Patient Name: Date of Service: [name], [name] 4/13/2021andnbsp8:00 AM Medical Record [id number] Patient Account Number: [id number] Date of Birth/Gender: Treating RN: 1946/03/05 (73 y.o. [name] Primary Care Physician: [name] Other Clinician: [name] Referring Physician: Treating Physician/Extender:[name], [name], [name] in Treatment: 9 Education Assessment Education Provided To: Patient Education Topics Provided Hyperbaric Oxygenation: Methods: Explain/Verbal Responses: State content correctly Electronic Signature(s) Signed: 12/11/2019 3:51:35 PM By: [name] EMT/HBOT Entered By: [name] on 12/11/2019 13:34:55 -------------------------------------------------------------------------------- Vitals Details Patient Name: Date of Service: [name], [name] 12/11/2019 8:00 AM Medical Record [id number] Patient Account Number: [id number] Date of Birth/Sex: Treating RN: 1946/06/18 (74 y.o. [name], [name] Primary Care Disa Riedlinger: [name] Other Clinician: [name] Referring Diesha Rostad: Treating Pansey Pinheiro/Extender:[name], [name], [name] in Treatment: 9 Vital Signs Time Taken: 07:50 Temperature (F): 97.9 Height (in): 72 Pulse (bpm): 56 Weight (lbs): 240 Respiratory Rate (breaths/min): 16 Body Mass Index (BMI): 32.5 Blood Pressure (mmHg): 121/63 Reference Range: 80 - 120 mg / dl Electronic Signature(s) Signed: 12/11/2019 3:51:35 PM By: [name] EMT/HBOT Entered By: [name] on 12/11/2019  09:00:56

## 2019-12-11 NOTE — Progress Notes (Addendum)
MAT, SUFFRIDGE ([id number]) Visit Report for 12/11/2019 HBO Details Patient Name: Date of Service: [name], [name] 12/11/2019 8:00 AM Medical Record [id number] Patient Account Number: [id number] Date of Birth/Sex: Treating RN: 08-03-1946 (74 y.o. [name], [name] Primary Care Dalani Mette: [name] Other Clinician: [name] Referring Loghan Kurtzman: Treating Aliviana Burdell/Extender:[name], [name], [name] in Treatment: 9 HBO Treatment Course Details Treatment Course Number: 1 Ordering Candise Crabtree: [name] Total Treatments Ordered: 40 HBO Treatment Start Date: 10/15/2019 HBO Indication: Late Effect of Radiation HBO Treatment Details Treatment Number: 37 Patient Type: Outpatient Chamber Type: Monoplace Chamber Serial #: [id number] Treatment Protocol: 2.5 ATA with 90 minutes oxygen, with two 5 minute air breaks Treatment Details Air breaks and CompressTx Pressure breathing periods DecompressDecompress Begins Reached (leave unused spaces Begins Ends blank) Chamber Pressure (ATA)1 2.5 2.5 2.5 2.5 2.5 --2.5 1 Clock Time (24 hr) 08:01 08:13 [id number] 10:05 Treatment Length: 124 (minutes) Treatment Segments: 4 Vital Signs Capillary Blood Glucose Reference Range: 80 - 120 mg / dl HBO Diabetic Blood Glucose Intervention Range: <131 mg/dl or >249 mg/dl Time Vitals Blood Respiratory Capillary Blood Glucose Pulse Action Type: Pulse: Temperature: Taken: Pressure: Rate: Glucose (mg/dl): Meter #: Oximetry (%) Taken: Pre 07:50 121/63 56 16 97.9 Post 10:07 168/79 51 14 98.3 Treatment Response Treatment Toleration: Well Treatment Completion Treatment Completed without Adverse Event Status: Diago Haik Notes No concerns with treatment given Physician HBO Attestation: I certify that I supervised this HBO treatment in accordance with Medicare guidelines. A trained Yes emergency response team is readily available per hospital policies and  procedures. Continue HBOT as ordered. Yes Electronic Signature(s) Signed: 12/11/2019 5:51:00 PM By: [name] MD Previous Signature: 12/11/2019 3:51:35 PM Version By: [name] EMT/HBOT Entered By: [name] on 12/11/2019 17:47:48 -------------------------------------------------------------------------------- HBO Safety Checklist Details Patient Name: Date of Service: [name] 12/11/2019 8:00 AM Medical Record [id number] Patient Account Number: [id number] Date of Birth/Sex: Treating RN: 31-Mar-1946 (74 y.o. [name], [name] Primary Care Kam Rahimi: [name] Other Clinician: [name] Referring Shondra Capps: Treating Lourdes Manning/Extender:[name], [name], [name] in Treatment: 9 HBO Safety Checklist Items Safety Checklist Consent Form Signed Patient voided / foley secured and emptied When did you last eato n/a Last dose of injectable or oral agent n/a NA Ostomy pouch emptied and vented if applicable NA All implantable devices assessed, documented and approved NA Intravenous access site secured and place Valuables secured Linens and cotton and cotton/polyester blend (less than 51% polyester) Personal oil-based products / skin lotions / body lotions removed NA Wigs or hairpieces removed NA Smoking or tobacco materials removed Books / newspapers / magazines / loose paper removed Cologne, aftershave, perfume and deodorant removed Jewelry removed (may wrap wedding band) NA Make-up removed Hair care products removed NA Battery operated devices (external) removed NA Heating patches and chemical warmers removed NA Titanium eyewear removed NA Nail polish cured greater than 10 hours NA Casting material cured greater than 10 hours NA Hearing aids removed NA Loose dentures or partials removed NA Prosthetics have been removed Patient demonstrates correct use of air break device (if applicable) Patient concerns have been addressed Patient  grounding bracelet on and cord attached to chamber Specifics for Inpatients (complete in addition to above) Medication sheet sent with patient Intravenous medications needed or due during therapy sent with patient Drainage tubes (e.g. nasogastric tube or chest tube secured and vented) Endotracheal or Tracheotomy tube secured Cuff deflated of air and inflated with saline Airway suctioned Electronic Signature(s) Signed: 12/11/2019 9:01:31 AM By: [name] EMT/HBOT Entered By: Ronnald Ramp,  Dedrick on 12/11/2019 09:01:30

## 2019-12-11 NOTE — Progress Notes (Signed)
JB, KODA ([id number]) Visit Report for 12/10/2019 SuperBill Details Patient Name: Date of Service: DARYEN, TEANEY 12/10/2019 Medical Record [id number] Patient Account Number: [id number] Date of Birth/Sex: Treating RN: Dec 08, 1945 (74 y.o. 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.0 Encounter for antineoplastic radiation therapy Z85.46 Personal history of malignant neoplasm of prostate Facility Procedures The patient participates with Medicare or their insurance follows the Medicare Facility Guidelines CPT4 Code Description Modifier Quantity [id number] G0277-(Facility Use Only) HBOT, full body chamber, 45min 4 Physician Procedures CPT4 Code Description Modifier Quantity [id number] - WC PHYS HYPERBARIC OXYGEN THERAPY 1 ICD-10 Diagnosis Description N30.41 Irradiation cystitis with hematuria Electronic Signature(s) Signed: 12/10/2019 4:10:46 PM By: [name] EMT/HBOT Signed: 12/11/2019 5:51:00 PM By: [name] MD Entered By: [name] on 12/10/2019 13:43:53

## 2019-12-11 NOTE — Progress Notes (Signed)
TARRICK, CARRAS ([id number]) Visit Report for 12/11/2019 SuperBill Details Patient Name: Date of Service: KRISTER, SHIRE 12/11/2019 Medical Record [id number] Patient Account Number: [id number] Date of Birth/Sex: Treating RN: [birth year] (74 y.o. 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 Encounter for antineoplastic radiation therapy Z85.46 Personal history of malignant neoplasm of prostate Facility Procedures The patient participates with Medicare or their insurance follows the Medicare Facility Guidelines CPT4 Code Description Modifier Quantity [id number] G0277-(Facility Use Only) HBOT, full body chamber, 12min 4 Physician Procedures CPT4 Code Description Modifier Quantity [id number] - WC PHYS HYPERBARIC OXYGEN THERAPY 1 ICD-10 Diagnosis Description N30.41 Irradiation cystitis with hematuria Electronic Signature(s) Signed: 12/11/2019 3:51:35 PM By: [name] EMT/HBOT Signed: 12/11/2019 5:51:00 PM By: [name] MD Entered By: [name] on 12/11/2019 13:34:44

## 2019-12-12 ENCOUNTER — Other Ambulatory Visit: Payer: Self-pay

## 2019-12-12 ENCOUNTER — Encounter (HOSPITAL_BASED_OUTPATIENT_CLINIC_OR_DEPARTMENT_OTHER): Payer: Medicare Other | Admitting: Physician Assistant

## 2019-12-12 DIAGNOSIS — N3041 Irradiation cystitis with hematuria: Secondary | ICD-10-CM | POA: Diagnosis not present

## 2019-12-12 NOTE — Progress Notes (Signed)
[name] ([id number]) Visit Report for 12/12/2019 Arrival Information Details Patient Name: Date of Service: [name], [name] 12/12/2019 8:00 AM Medical Record [id number] Patient Account Number: [id number] Date of Birth/Sex: Treating RN: [birth year] (74 y.o. [name] Primary Care Libbi Towner: [name] Other Clinician: [name] Referring Kynsie Falkner: Treating Lyfe Reihl/Extender:Stone III, [name], [name] in Treatment: 9 Visit Information History Since Last Visit Added or deleted any medications: No Patient Arrived: Ambulatory Any new allergies or adverse reactions: No Arrival Time: 08:05 Had a fall or experienced change in No Accompanied By: self activities of daily living that may affect Transfer Assistance: None risk of falls: Patient Identification Verified: Yes Signs or symptoms of abuse/neglect since last No Secondary Verification Process Yes visito Completed: Hospitalized since last visit: No Patient Requires Transmission-Based No Implantable device outside of the clinic excluding No Precautions: cellular tissue based products placed in the center Patient Has Alerts: No since last visit: Pain Present Now: No Electronic Signature(s) Signed: 12/12/2019 3:52:45 PM By: [name] EMT/HBOT Entered By: [name] on 12/12/2019 08:39:54 -------------------------------------------------------------------------------- Encounter Discharge Information Details Patient Name: Date of Service: [name] 12/12/2019 8:00 AM Medical Record [id number] Patient Account Number: [id number] Date of Birth/Sex: Treating RN: [birth year] (74 y.o. [name] Primary Care Jaquavian Firkus: [name] Other Clinician: [name] Referring Dao Mearns: Treating Dung Prien/Extender:Stone III, [name], [name] in Treatment: 9 Encounter Discharge Information Items Discharge Condition: Stable Ambulatory Status: Ambulatory Discharge  Destination: Home Transportation: Private Auto Accompanied By: self Schedule Follow-up Appointment: Yes Clinical Summary of Care: Patient Declined Electronic Signature(s) Signed: 12/12/2019 3:52:45 PM By: [name] EMT/HBOT Entered By: [name] on 12/12/2019 11:28:32 -------------------------------------------------------------------------------- Patient/Caregiver Education Details Patient Name: Date of Service: [name] 4/14/2021andnbsp8:00 AM Medical Record [id number] Patient Account Number: [id number] Date of Birth/Gender: Treating RN: [birth year] (73 y.o. [name] Primary Care Physician: [name] Other Clinician: [name] Referring Physician: Treating Physician/Extender:Stone III, [name], [name] in Treatment: 9 Education Assessment Education Provided To: Patient Education Topics Provided Hyperbaric Oxygenation: Methods: Explain/Verbal Responses: State content correctly Electronic Signature(s) Signed: 12/12/2019 3:52:45 PM By: [name] EMT/HBOT Entered By: [name] on 12/12/2019 11:28:21 -------------------------------------------------------------------------------- Vitals Details Patient Name: Date of Service: [name] 12/12/2019 8:00 AM Medical Record [id number] Patient Account Number: [id number] Date of Birth/Sex: Treating RN: 08-05-1946 (74 y.o. [name] Primary Care Temisha Murley: [name] Other Clinician: [name] Referring Gaylin Bulthuis: Treating Jaquita Bessire/Extender:Stone III, [name], [name] in Treatment: 9 Vital Signs Time Taken: 08:10 Temperature (F): 98.4 Height (in): 72 Pulse (bpm): 59 Weight (lbs): 240 Respiratory Rate (breaths/min): 16 Body Mass Index (BMI): 32.5 Blood Pressure (mmHg): 121/67 Reference Range: 80 - 120 mg / dl Electronic Signature(s) Signed: 12/12/2019 3:52:45 PM By: [name] EMT/HBOT Entered By: [name] on 12/12/2019  08:40:07

## 2019-12-12 NOTE — Progress Notes (Addendum)
GRAVES, HANSON ([id number]) Visit Report for 12/12/2019 HBO Details Patient Name: Date of Service: [name], [name] 12/12/2019 8:00 AM Medical Record [id number] Patient Account Number: [id number] Date of Birth/Sex: Treating RN: [birth year] (74 y.o. [name] Primary Care Sarahelizabeth Conway: [name] Other Clinician: [name] Referring Shanekqua Schaper: Treating Bettylee Feig/Extender:Stone III, [name], [name] in Treatment: 9 HBO Treatment Course Details Treatment Course Number: 1 Ordering Saim Almanza: [name] Total Treatments Ordered: 40 HBO Treatment Start Date: 10/15/2019 HBO Indication: Late Effect of Radiation HBO Treatment Details Treatment Number: 38 Patient Type: Outpatient Chamber Type: Monoplace Chamber Serial #: [id number] Treatment Protocol: 2.5 ATA with 90 minutes oxygen, with two 5 minute air breaks Treatment Details Compression Rate Down: 2.0 psi / minute De-Compression Rate Up: 2.0 psi / minute Air breaks and CompressTx Pressure breathing periods DecompressDecompress Begins Reached (leave unused spaces Begins Ends blank) Chamber Pressure (ATA)1 2.5 2.5 2.5 2.5 2.5 --2.5 1 Clock Time (24 hr) 08:09 08:21 [id number] 10:13 Treatment Length: 124 (minutes) Treatment Segments: 4 Vital Signs Capillary Blood Glucose Reference Range: 80 - 120 mg / dl HBO Diabetic Blood Glucose Intervention Range: <131 mg/dl or >249 mg/dl Time Vitals Blood Respiratory Capillary Blood Glucose Pulse Action Type: Pulse: Temperature: Taken: Pressure: Rate: Glucose (mg/dl): Meter #: Oximetry (%) Taken: Pre 08:10 121/67 59 16 98.4 Post 10:15 164/91 52 15 98.1 Treatment Response Treatment Toleration: Well Treatment Completion Treatment Completed without Adverse Event Status: Physician HBO Attestation: I certify that I supervised this HBO treatment in accordance with Medicare guidelines. A trained Yes emergency response team is readily available  per hospital policies and procedures. Continue HBOT as ordered. Yes Electronic Signature(s) Signed: 12/12/2019 4:28:15 PM By: Worthy Keeler PA-C Previous Signature: 12/12/2019 3:52:45 PM Version By: [name] EMT/HBOT Entered By: Worthy Keeler on 12/12/2019 16:28:15 -------------------------------------------------------------------------------- HBO Safety Checklist Details Patient Name: Date of Service: [name], [name] 12/12/2019 8:00 AM Medical Record [id number] Patient Account Number: [id number] Date of Birth/Sex: Treating RN: 1946/04/19 (74 y.o. [name] Primary Care Jameson Tormey: [name] Other Clinician: [name] Referring Keala Drum: Treating Vann Okerlund/Extender:Stone III, [name], [name] in Treatment: 9 HBO Safety Checklist Items Safety Checklist Consent Form Signed Patient voided / foley secured and emptied When did you last eato n/a Last dose of injectable or oral agent n/a NA Ostomy pouch emptied and vented if applicable NA All implantable devices assessed, documented and approved NA Intravenous access site secured and place Valuables secured Linens and cotton and cotton/polyester blend (less than 51% polyester) Personal oil-based products / skin lotions / body lotions removed NA Wigs or hairpieces removed NA Smoking or tobacco materials removed Books / newspapers / magazines / loose paper removed Cologne, aftershave, perfume and deodorant removed Jewelry removed (may wrap wedding band) NA Make-up removed Hair care products removed NA Battery operated devices (external) removed NA Heating patches and chemical warmers removed NA Titanium eyewear removed NA Nail polish cured greater than 10 hours NA Casting material cured greater than 10 hours NA Hearing aids removed NA Loose dentures or partials removed NA Prosthetics have been removed Patient demonstrates correct use of air break device (if applicable) Patient concerns have  been addressed Patient grounding bracelet on and cord attached to chamber Specifics for Inpatients (complete in addition to above) Medication sheet sent with patient Intravenous medications needed or due during therapy sent with patient Drainage tubes (e.g. nasogastric tube or chest tube secured and vented) Endotracheal or Tracheotomy tube secured Cuff deflated of air and inflated with saline Airway suctioned Electronic Signature(s)  Signed: 12/12/2019 8:40:42 AM By: [name] EMT/HBOT Entered By: [name] on 12/12/2019 08:40:42

## 2019-12-12 NOTE — Progress Notes (Signed)
THOMASJAMES, PETRELLA ([id number]) Visit Report for 12/12/2019 Problem List Details Patient Name: Date of Service: [name], [name] 12/12/2019 8:00 AM Medical Record [id number] Patient Account Number: [id number] Date of Birth/Sex: Treating RN: 07/23/1946 (74 y.o. 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 Facility Procedures The patient participates with Medicare or their insurance follows the Medicare Facility Guidelines: CPT4 Code Description Modifier Quantity [id number]-(Facility Use Only) HBOT, full body chamber, 73min 4 Physician Procedures CPT4 Code Description: [id number] - WC PHYS HYPERBARIC OXYGEN THERAPY ICD-10 Diagnosis Description  N30.41 Irradiation cystitis with hematuria Modifier: Quantity: 1 Electronic Signature(s) Signed: 12/12/2019 4:28:20 PM By: Worthy Keeler PA-C Previous Signature: 12/12/2019 3:52:45 PM Version By: [name] EMT/HBOT Entered By: Worthy Keeler on 12/12/2019 16:28:19

## 2019-12-13 ENCOUNTER — Encounter (HOSPITAL_BASED_OUTPATIENT_CLINIC_OR_DEPARTMENT_OTHER): Payer: Medicare Other | Admitting: Internal Medicine

## 2019-12-13 DIAGNOSIS — N3041 Irradiation cystitis with hematuria: Secondary | ICD-10-CM | POA: Diagnosis not present

## 2019-12-13 NOTE — Progress Notes (Addendum)
KANIEL, BOTLEY ([id number]) Visit Report for 12/13/2019 HBO Details Patient Name: Date of Service: [name], [name] 12/13/2019 8:00 AM Medical Record [id number] Patient Account Number: [id number] Date of Birth/Sex: Treating RN: [birth year] (74 y.o. [name], [name] Primary Care Earnstine Meinders: [name] Other Clinician: [name] Referring Keanna Tugwell: Treating Jasun Gasparini/Extender:[name], [name], [name] in Treatment: 9 HBO Treatment Course Details Treatment Course Number: 1 Ordering Georgi Navarrete: [name] Total Treatments Ordered: 40 HBO Treatment Start Date: 10/15/2019 HBO Indication: Late Effect of Radiation HBO Treatment Details Treatment Number: 39 Patient Type: Outpatient Chamber Type: Monoplace Chamber Serial #: [id number] Treatment Protocol: 2.5 ATA with 90 minutes oxygen, with two 5 minute air breaks Treatment Details Air breaks and CompressTx Pressure breathing periods DecompressDecompress Begins Reached (leave unused spaces Begins Ends blank) Chamber Pressure (ATA)1 2.5 2.5 2.5 2.5 2.5 --2.5 1 Clock Time (24 hr) 08:13 08:25 08:5509:0009:3009:35--10:05 10:17 Treatment Length: 124 (minutes) Treatment Segments: 4 Vital Signs Capillary Blood Glucose Reference Range: 80 - 120 mg / dl HBO Diabetic Blood Glucose Intervention Range: <131 mg/dl or >249 mg/dl Time Vitals Blood Respiratory Capillary Blood Glucose Pulse Action Type: Pulse: Temperature: Taken: Pressure: Rate: Glucose (mg/dl): Meter #: Oximetry (%) Taken: Pre 08:10 130/66 56 14 97.9 Post 10:20 151/73 51 14 98.1 Treatment Response Treatment Toleration: Well Treatment Completion Treatment Completed without Adverse Event Status: Jenella Craigie Notes No concerns with treatment given Physician HBO Attestation: I certify that I supervised this HBO treatment in accordance with Medicare guidelines. A trained Yes emergency response team is readily available per hospital policies and  procedures. Continue HBOT as ordered. Yes Electronic Signature(s) Signed: 12/15/2019 6:55:41 AM By: [name] MD Entered By: [name] on 12/13/2019 15:36:21 -------------------------------------------------------------------------------- HBO Safety Checklist Details Patient Name: Date of Service: [name], [name] 12/13/2019 8:00 AM Medical Record [id number] Patient Account Number: [id number] Date of Birth/Sex: Treating RN: 18-Jan-1946 (74 y.o. [name], [name] Primary Care Waller Marcussen: [name] Other Clinician: [name] Referring Roxas Clymer: Treating Tan Clopper/Extender:[name], [name], [name] in Treatment: 9 HBO Safety Checklist Items Safety Checklist Consent Form Signed Patient voided / foley secured and emptied When did you last eato n/a Last dose of injectable or oral agent n/a NA Ostomy pouch emptied and vented if applicable NA All implantable devices assessed, documented and approved NA Intravenous access site secured and place Valuables secured Linens and cotton and cotton/polyester blend (less than 51% polyester) Personal oil-based products / skin lotions / body lotions removed NA Wigs or hairpieces removed NA Smoking or tobacco materials removed Books / newspapers / magazines / loose paper removed Cologne, aftershave, perfume and deodorant removed Jewelry removed (may wrap wedding band) NA Make-up removed Hair care products removed NA Battery operated devices (external) removed NA Heating patches and chemical warmers removed NA Titanium eyewear removed NA Nail polish cured greater than 10 hours NA Casting material cured greater than 10 hours NA Hearing aids removed NA Loose dentures or partials removed NA Prosthetics have been removed Patient demonstrates correct use of air break device (if applicable) Patient concerns have been addressed Patient grounding bracelet on and cord attached to chamber Specifics for Inpatients  (complete in addition to above) Medication sheet sent with patient Intravenous medications needed or due during therapy sent with patient Drainage tubes (e.g. nasogastric tube or chest tube secured and vented) Endotracheal or Tracheotomy tube secured Cuff deflated of air and inflated with saline Airway suctioned Electronic Signature(s) Signed: 12/13/2019 3:38:10 PM By: [name] EMT/HBOT Previous Signature: 12/13/2019 8:31:38 AM Version By: [name] EMT/HBOT Entered By: Ronnald Ramp,  Dedrick on 12/13/2019 08:31:53

## 2019-12-13 NOTE — Progress Notes (Signed)
[name] ([id number]) Visit Report for 12/13/2019 Arrival Information Details Patient Name: Date of Service: [name], [name] 12/13/2019 8:00 AM Medical Record [id number] Patient Account Number: [id number] Date of Birth/Sex: Treating RN: [birth year] (74 y.o. [name], [name] Primary Care [name]: [name] Other Clinician: Mikeal [name] Referring [name]: Treating Laaibah Wartman/Extender:[name], [name] in Treatment: 9 Visit Information History Since Last Visit Added or deleted any medications: No Patient Arrived: Ambulatory Any new allergies or adverse reactions: No Arrival Time: 08:10 Had a fall or experienced change in No Accompanied By: self activities of daily living that may affect Transfer Assistance: None risk of falls: Patient Identification Verified: Yes Signs or symptoms of abuse/neglect since last No Secondary Verification Process Yes visito Completed: Hospitalized since last visit: No Patient Requires Transmission-Based No Implantable device outside of the clinic excluding No Precautions: cellular tissue based products placed in the center Patient Has Alerts: No since last visit: Pain Present Now: No Electronic Signature(s) Signed: 12/13/2019 3:38:10 PM By: [name] EMT/HBOT Entered By: [name] on 12/13/2019 08:30:44 -------------------------------------------------------------------------------- Encounter Discharge Information Details Patient Name: Date of Service: [name], [name] 12/13/2019 8:00 AM Medical Record [id number] Patient Account Number: [id number] Date of Birth/Sex: Treating RN: 11/23/1945 (74 y.o. [name] Primary Care Amor Packard: [name] Other Clinician: Mikeal [name] Referring [name]: Treating [name]/Extender:[name], [name] in Treatment: 9 Encounter Discharge Information Items Discharge Condition: Stable Ambulatory Status: Ambulatory Discharge  Destination: Home Transportation: Private Auto Accompanied By: self Schedule Follow-up Appointment: Yes Clinical Summary of Care: Patient Declined Electronic Signature(s) Signed: 12/13/2019 3:38:10 PM By: [name] EMT/HBOT Entered By: [name] on 12/13/2019 11:21:59 -------------------------------------------------------------------------------- Patient/Caregiver Education Details Patient Name: Date of Service: [name], [name] 4/15/2021andnbsp8:00 AM Medical Record [id number] Patient Account Number: [id number] Date of Birth/Gender: Treating RN: 01/18/1946 (73 y.o. [name] Primary Care Physician: [name] Other Clinician: Mikeal [name] Referring Physician: Treating Physician/Extender:[name], [name] in Treatment: 9 Education Assessment Education Provided To: Patient Education Topics Provided Hyperbaric Oxygenation: Methods: Explain/Verbal Responses: State content correctly Electronic Signature(s) Signed: 12/13/2019 3:38:10 PM By: [name] EMT/HBOT Entered By: [name] on 12/13/2019 11:21:49 -------------------------------------------------------------------------------- Vitals Details Patient Name: Date of Service: [name], [name] 12/13/2019 8:00 AM Medical Record [id number] Patient Account Number: [id number] Date of Birth/Sex: Treating RN: 1945/11/24 (74 y.o. [name] Primary Care [name]: [name] Other Clinician: Referring [name]: Treating [name]:[name], [name] in Treatment: 9 Vital Signs Time Taken: 08:10 Temperature (F): 97.9 Height (in): 72 Pulse (bpm): 56 Weight (lbs): 240 Respiratory Rate (breaths/min): 14 Body Mass Index (BMI): 32.5 Blood Pressure (mmHg): 130/66 Reference Range: 80 - 120 mg / dl Electronic Signature(s) Signed: 12/13/2019 3:38:10 PM By: [name] EMT/HBOT Entered By: [name] on 12/13/2019 08:31:00

## 2019-12-14 ENCOUNTER — Encounter (HOSPITAL_BASED_OUTPATIENT_CLINIC_OR_DEPARTMENT_OTHER): Payer: Medicare Other | Admitting: Internal Medicine

## 2019-12-14 ENCOUNTER — Other Ambulatory Visit: Payer: Self-pay

## 2019-12-14 DIAGNOSIS — N3041 Irradiation cystitis with hematuria: Secondary | ICD-10-CM | POA: Diagnosis not present

## 2019-12-14 NOTE — Progress Notes (Addendum)
[name], [name] ([id number]) Visit Report for 12/14/2019 HBO Details Patient Name: Date of Service: [name], [name] 12/14/2019 8:00 AM Medical Record [id number] Patient Account Number: [id number] Date of Birth/Sex: Treating RN: 11/06/1945 (74 y.o. M) Primary Care Jahmeek Shirk: [name] Other Clinician: [name] Referring Brexton Sofia: Treating Myra Weng/Extender:[name], [name], [name]0 HBO Treatment Course Details Treatment Course Number: 1 Ordering [name] Ewing Fandino: Total Treatments Ordered: 40 HBO HBO Indication: Treatment 10/15/2019 Late Effect of Radiation Start Date: HBO Treatment 12/14/2019 End Date: HBO Treatment Series Complete; Non-Wound Discharge Protocol Completed with Symptom Relief Outcome: HBO Treatment Details Treatment Number: 40 Patient Type: Outpatient Chamber Type: Monoplace Chamber Serial #: [id number] Treatment Protocol: 2.5 ATA with 90 minutes oxygen, with two 5 minute air breaks Treatment Details Compression Rate Down: 2.0 psi / minute De-Compression Rate Up: 2.0 psi / minute Air breaks and CompressTx Pressure breathing periods DecompressDecompress Begins Reached (leave unused spaces Begins Ends blank) Chamber Pressure (ATA)1 2.5 2.5 2.5 2.5 2.5 --2.5 1 Clock Time (24 hr) 08:10 08:22 [id number] 10:23 Treatment Length: 133 (minutes) Treatment Segments: 4 Vital Signs Capillary Blood Glucose Reference Range: 80 - 120 mg / dl HBO Diabetic Blood Glucose Intervention Range: <131 mg/dl or >249 mg/dl Time Vitals Blood Respiratory Capillary Blood Glucose Pulse Action Type: Pulse: Temperature: Taken: Pressure: Rate: Glucose (mg/dl): Meter #: Oximetry (%) Taken: Pre 08:05 135/73 53 16 98.1 Post 10:25 161/96 55 14 98.4 Treatment Response Treatment Toleration: Well Treatment Completion Treatment Completed without Adverse Event Status: Treatment Notes Pt kept idle at depth for an additional  72min due to decompression safety guidelines regarding comp/decomp Frazier Balfour Notes Patient's last treatment. He feels much improved. No visible hematuria. He feels frequency has also improved. Tolerated his last treatment well Physician HBO Attestation: I certify that I supervised this HBO treatment in accordance with Medicare guidelines. A trained Yes emergency response team is readily available per hospital policies and procedures. Continue HBOT as ordered. Yes Electronic Signature(s) Signed: 12/15/2019 6:55:41 AM By: [name] MD Previous Signature: 12/14/2019 3:47:29 PM Version By: [name] EMT/HBOT Previous Signature: 12/14/2019 11:31:40 AM Version By: [name] EMT/HBOT Entered By: [name] on 12/15/2019 06:53:48 -------------------------------------------------------------------------------- HBO Safety Checklist Details Patient Name: Date of Service: [name], [name] 12/14/2019 8:00 AM Medical Record [id number] Patient Account Number: [id number] Date of Birth/Sex: Treating RN: [birth year] (74 y.o. M) Primary Care Macallister Ashmead: [name] Other Clinician: [name] Referring Cathyrn Deas: Treating Kamarii Carton/Extender:[name], [name], [name]0 HBO Safety Checklist Items Safety Checklist Consent Form Signed Patient voided / foley secured and emptied When did you last eato n/a Last dose of injectable or oral agent n/a NA Ostomy pouch emptied and vented if applicable NA All implantable devices assessed, documented and approved NA Intravenous access site secured and place Valuables secured Linens and cotton and cotton/polyester blend (less than 51% polyester) Personal oil-based products / skin lotions / body lotions removed NA Wigs or hairpieces removed NA Smoking or tobacco materials removed Books / newspapers / magazines / loose paper removed Cologne, aftershave, perfume and deodorant removed Jewelry removed (may wrap  wedding band) NA Make-up removed Hair care products removed NA Battery operated devices (external) removed NA Heating patches and chemical warmers removed NA Titanium eyewear removed Nail polish cured greater than 10 hours NA Casting material cured greater than 10 hours NA Hearing aids removed NA Loose dentures or partials removed NA Prosthetics have been removed NA Patient demonstrates correct use of air break device (if applicable) Patient concerns have been addressed  Patient grounding bracelet on and cord attached to chamber Specifics for Inpatients (complete in addition to above) Medication sheet sent with patient Intravenous medications needed or due during therapy sent with patient Drainage tubes (e.g. nasogastric tube or chest tube secured and vented) Endotracheal or Tracheotomy tube secured Cuff deflated of air and inflated with saline Airway suctioned Electronic Signature(s) Signed: 12/14/2019 8:33:28 AM By: [name] EMT/HBOT Entered By: [name] on 12/14/2019 08:33:27

## 2019-12-14 NOTE — Progress Notes (Signed)
ROLLO, UPPAL ([id number]) Visit Report for 12/14/2019 Arrival Information Details Patient Name: Date of Service: [name], [name] 12/14/2019 8:00 AM Medical Record [id number] Patient Account Number: [id number] Date of Birth/Sex: Treating RN: [birth year] (74 y.o. M) Primary Care Rawlin Reaume: [name] Other Clinician: [name] Referring Leahna Hewson: Treating Kiyra Slaubaugh/Extender:[name], [name], [name]0 Visit Information History Since Last Visit Added or deleted any medications: No Patient Arrived: Ambulatory Any new allergies or adverse reactions: No Arrival Time: 08:00 Had a fall or experienced change in No Accompanied By: self activities of daily living that may affect Transfer Assistance: None risk of falls: Patient Identification Verified: Yes Signs or symptoms of abuse/neglect since last No Secondary Verification Process Yes visito Completed: Hospitalized since last visit: No Patient Requires Transmission-Based No Implantable device outside of the clinic excluding No Precautions: cellular tissue based products placed in the center Patient Has Alerts: No since last visit: Pain Present Now: No Electronic Signature(s) Signed: 12/14/2019 3:47:29 PM By: [name] EMT/HBOT Entered By: [name] on 12/14/2019 08:31:52 -------------------------------------------------------------------------------- Encounter Discharge Information Details Patient Name: Date of Service: [name], [name] 12/14/2019 8:00 AM Medical Record [id number] Patient Account Number: [id number] Date of Birth/Sex: Treating RN: 10/07/1945 (74 y.o. M) Primary Care Sergei Delo: [name] Other Clinician: [name] Referring Arth Nicastro: Treating Jackson Coffield/Extender:[name], [name], [name]0 Encounter Discharge Information Items Discharge Condition: Stable Ambulatory Status: Ambulatory Discharge Destination: Home Transportation:  Private Auto Accompanied By: self Schedule Follow-up Appointment: Yes Clinical Summary of Care: Patient Declined Electronic Signature(s) Signed: 12/14/2019 3:47:29 PM By: [name] EMT/HBOT Entered By: [name] on 12/14/2019 11:23:55 -------------------------------------------------------------------------------- Patient/Caregiver Education Details Patient Name: Date of Service: [name], [name] 4/16/2021andnbsp8:00 AM Medical Record [id number] Patient Account Number: [id number] Date of Birth/Gender: Treating RN: 1946-06-19 (73 y.o. M) Primary Care Physician: [name] Other Clinician: [name] Referring Physician: Treating Physician/Extender:[name], [name], [name]0 Education Assessment Education Provided To: Patient Education Topics Provided Hyperbaric Oxygenation: Methods: Explain/Verbal Responses: State content correctly Electronic Signature(s) Signed: 12/14/2019 3:47:29 PM By: [name] EMT/HBOT Entered By: [name] on 12/14/2019 11:23:40 -------------------------------------------------------------------------------- Vitals Details Patient Name: Date of Service: [name], [name] 12/14/2019 8:00 AM Medical Record [id number] Patient Account Number: [id number] Date of Birth/Sex: Treating RN: [birth year] (74 y.o. M) Primary Care Sawyer Kahan: [name] Other Clinician: [name] Referring Daviana Haymaker: Treating Anjali Manzella/Extender:[name], [name], [name]0 Vital Signs Time Taken: 08:05 Temperature (F): 98.1 Height (in): 72 Pulse (bpm): 53 Weight (lbs): 240 Respiratory Rate (breaths/min): 16 Body Mass Index (BMI): 32.5 Blood Pressure (mmHg): 135/73 Reference Range: 80 - 120 mg / dl Electronic Signature(s) Signed: 12/14/2019 3:47:29 PM By: [name] EMT/HBOT Entered By: [name] on 12/14/2019 08:32:32

## 2019-12-15 NOTE — Progress Notes (Signed)
ISHAAN, STINGER ([id number]) Visit Report for 12/13/2019 SuperBill Details Patient Name: Date of Service: [name], [name] 12/13/2019 Medical Record [id number] Patient Account Number: [id number] Date of Birth/Sex: Treating RN: [birth year] (74 y.o. 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 Encounter for antineoplastic radiation therapy Z85.46 Personal history of malignant neoplasm of prostate Facility Procedures The patient participates with Medicare or their insurance follows the Medicare Facility Guidelines CPT4 Code Description Modifier Quantity [id number] G0277-(Facility Use Only) HBOT, full body chamber, 26min 4 Physician Procedures CPT4 Code Description Modifier Quantity [id number] - WC PHYS HYPERBARIC OXYGEN THERAPY 1 ICD-10 Diagnosis Description N30.41 Irradiation cystitis with hematuria Electronic Signature(s) Signed: 12/13/2019 3:38:10 PM By: [name] EMT/HBOT Signed: 12/15/2019 6:55:41 AM By: [name] MD Entered By: [name] on 12/13/2019 11:21:36

## 2019-12-15 NOTE — Progress Notes (Signed)
ARKA, VEDROS ([id number]) Visit Report for 12/14/2019 SuperBill Details Patient Name: Date of Service: CASTON, STRZELCZYK 12/14/2019 Medical Record [id number] Patient Account Number: [id number] Date of Birth/Sex: Treating RN: 07/13/1946 (74 y.o. 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 Encounter for antineoplastic radiation therapy Z85.46 Personal history of malignant neoplasm of prostate Facility Procedures The patient participates with Medicare or their insurance follows the Medicare Facility Guidelines CPT4 Code Description Modifier Quantity [id number] G0277-(Facility Use Only) HBOT, full body chamber, 82min 4 Physician Procedures CPT4 Code Description Modifier Quantity [id number] - WC PHYS HYPERBARIC OXYGEN THERAPY 1 ICD-10 Diagnosis Description N30.41 Irradiation cystitis with hematuria Electronic Signature(s) Signed: 12/14/2019 3:47:29 PM By: [name] EMT/HBOT Signed: 12/15/2019 6:55:41 AM By: [name] MD Entered By: [name] on 12/14/2019 11:23:25

## 2020-01-05 ENCOUNTER — Other Ambulatory Visit: Payer: Self-pay | Admitting: Family Medicine

## 2020-01-10 ENCOUNTER — Other Ambulatory Visit: Payer: Self-pay

## 2020-01-11 ENCOUNTER — Ambulatory Visit (INDEPENDENT_AMBULATORY_CARE_PROVIDER_SITE_OTHER): Payer: Medicare Other | Admitting: Family Medicine

## 2020-01-11 ENCOUNTER — Encounter: Payer: Self-pay | Admitting: Family Medicine

## 2020-01-11 VITALS — BP 126/72 | HR 53 | Temp 97.3°F | Ht 72.0 in | Wt 235.6 lb

## 2020-01-11 DIAGNOSIS — E7800 Pure hypercholesterolemia, unspecified: Secondary | ICD-10-CM | POA: Diagnosis not present

## 2020-01-11 DIAGNOSIS — D696 Thrombocytopenia, unspecified: Secondary | ICD-10-CM

## 2020-01-11 DIAGNOSIS — E039 Hypothyroidism, unspecified: Secondary | ICD-10-CM | POA: Diagnosis not present

## 2020-01-11 DIAGNOSIS — I10 Essential (primary) hypertension: Secondary | ICD-10-CM | POA: Insufficient documentation

## 2020-01-11 DIAGNOSIS — M109 Gout, unspecified: Secondary | ICD-10-CM | POA: Diagnosis not present

## 2020-01-11 NOTE — Progress Notes (Signed)
Established Patient Office Visit  Subjective:  Patient ID: [name], male    DOB: 08/10/1946  Age: 74 y.o. MRN: [id number]  CC:  Chief Complaint  Patient presents with  . Follow-up    3 month follow up,  no concerns.     HPI [name] presents for follow-up of his gout, hypertension, hypothyroidism and elevated cholesterol.  Gout has been well controlled on the .0. uses Indocin as needed.  Taking her levothyroxine daily in the morning before breakfast.  Continues with rosuvastatin.  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  Has not seen any further blood in his urine.  He is in need of a right knee replacement and is considering Dr. Baron Hamper and [facility].  Interested in treatment for onychomycosis.  Past Medical History:  Diagnosis Date  . High cholesterol   . Hypertension   . Thyroid disease     No past surgical history on file.  No family history on file.  Social History   Socioeconomic History  . Marital status: Married    Spouse name: Not on file  . Number of children: Not on file  . Years of education: Not on file  . Highest education level: Not on file  Occupational History  . Not on file  Tobacco Use  . Smoking status: Never Smoker  . Smokeless tobacco: Never Used  Substance and Sexual Activity  . Alcohol use: Not Currently  . Drug use: Never  . Sexual activity: Not on file  Other Topics Concern  . Not on file  Social History Narrative  . Not on file   Social Determinants of Health   Financial Resource Strain:   . Difficulty of Paying Living Expenses:   Food Insecurity:   . Worried About Charity fundraiser in the Last Year:   . Arboriculturist in the Last Year:   Transportation Needs:   . Film/video editor (Medical):   Marland Kitchen Lack of Transportation (Non-Medical):   Physical Activity:   . Days of Exercise per Week:   . Minutes of Exercise per Session:   Stress:   . Feeling of Stress :   Social  Connections:   . Frequency of Communication with Friends and Family:   . Frequency of Social Gatherings with Friends and Family:   . Attends Religious Services:   . Active Member of Clubs or Organizations:   . Attends Archivist Meetings:   Marland Kitchen Marital Status:   Intimate Partner Violence:   . Fear of Current or Ex-Partner:   . Emotionally Abused:   Marland Kitchen Physically Abused:   . Sexually Abused:     Outpatient Medications Prior to Visit  Medication Sig Dispense Refill  . allopurinol (ZYLOPRIM) 100 MG tablet Take 1 tablet (100 mg total) by mouth 2 (two) times daily. 180 tablet 2  . aspirin 81 MG chewable tablet Chew 1 tablet by mouth daily.    . carvedilol (COREG) 25 MG tablet Take 1 tablet (25 mg total) by mouth 2 (two) times daily. 180 tablet 2  . Coenzyme Q10 (COQ10 PO) Take by mouth.    . folic acid (FOLVITE) 1 MG tablet Take 1 tablet (1 mg total) by mouth daily. 90 tablet 2  . levothyroxine (SYNTHROID) 75 MCG tablet Take 1 tablet (75 mcg total) by mouth daily. 90 tablet 2  . rosuvastatin (CRESTOR) 10 MG tablet TAKE 1 TABLET(10 MG) BY MOUTH DAILY 90 tablet 1  .  indomethacin (INDOCIN) 25 MG capsule Take by mouth as needed.     No facility-administered medications prior to visit.    No Known Allergies  ROS Review of Systems  Constitutional: Negative.   HENT: Negative.   Eyes: Negative for photophobia and visual disturbance.  Respiratory: Negative.   Cardiovascular: Negative.   Gastrointestinal: Negative.   Endocrine: Negative for cold intolerance, heat intolerance and polyuria.  Genitourinary: Negative for hematuria.  Musculoskeletal: Positive for arthralgias and gait problem.  Skin: Negative for pallor and rash.  Allergic/Immunologic: Negative for immunocompromised state.  Hematological: Does not bruise/bleed easily.  Psychiatric/Behavioral: Negative.       Objective:    Physical Exam  Constitutional: He is oriented to person, place, and time. He appears  well-developed and well-nourished. No distress.  HENT:  Head: Normocephalic and atraumatic.  Right Ear: External ear normal.  Left Ear: External ear normal.  Eyes: Conjunctivae are normal. Right eye exhibits no discharge. Left eye exhibits no discharge. No scleral icterus.  Neck: No JVD present. No tracheal deviation present.  Cardiovascular: Normal rate, regular rhythm and normal heart sounds.  Pulmonary/Chest: Effort normal and breath sounds normal. No stridor.  Musculoskeletal:        General: No edema.  Neurological: He is alert and oriented to person, place, and time.  Skin: Skin is warm and dry. He is not diaphoretic.  Psychiatric: He has a normal mood and affect. His behavior is normal.    BP 126/72   Pulse (!) 53   Temp (!) 97.3 F (36.3 C) (Tympanic)   Ht 6' (1.829 m)   Wt 235 lb 9.6 oz (106.9 kg)   SpO2 95%   BMI 31.95 kg/m  Wt Readings from Last 3 Encounters:  01/11/20 235 lb 9.6 oz (106.9 kg)  10/12/19 248 lb 9.6 oz (112.8 kg)  05/03/19 244 lb (110.7 kg)     Health Maintenance Due  Topic Date Due  . Hepatitis C Screening  Never done  . TETANUS/TDAP  Never done  . PNA vac Low Risk Adult (1 of 2 - PCV13) Never done    There are no preventive care reminders to display for this patient.  Lab Results  Component Value Date   TSH 3.23 04/20/2019   Lab Results  Component Value Date   WBC 5.0 10/12/2019   HGB 14.0 10/12/2019   HCT 41.3 10/12/2019   MCV 90.4 10/12/2019   PLT 140 10/12/2019   Lab Results  Component Value Date   NA 142 04/20/2019   K 5.3 04/20/2019   BUN 17 04/20/2019   CREATININE 1.0 04/20/2019   ALKPHOS 71 04/20/2019   AST 15 04/20/2019   ALT 13 04/20/2019   Lab Results  Component Value Date   CHOL 152 04/20/2019   Lab Results  Component Value Date   HDL 42 04/20/2019   Lab Results  Component Value Date   LDLCALC 86 04/20/2019   Lab Results  Component Value Date   TRIG 138 04/20/2019   No results found for: CHOLHDL Lab  Results  Component Value Date   HGBA1C 5.8 04/20/2019      Assessment & Plan:   Problem List Items Addressed This Visit      Cardiovascular and Mediastinum   Essential hypertension   Relevant Orders   Comprehensive metabolic panel     Endocrine   Hypothyroidism   Relevant Orders   TSH     Other   Thrombocytopenia ([place])   Relevant Orders   CBC  Gout - Primary   Relevant Orders   Uric acid   Elevated cholesterol   Relevant Orders   LDL cholesterol, direct      No orders of the defined types were placed in this encounter.   Follow-up: Return in about 6 months (around 07/13/2020).   We discussed treatment of onychomycosis, monitoring the liver enzymes and significant rate of recidivism.  He would like to hold off for now.  Continue all other medicines.  Considering follow[facility]up with [facility] for his radiation cystitis Libby Maw, MD

## 2020-01-12 LAB — CBC
HCT: 43.8 % (ref 38.5–50.0)
Hemoglobin: 14.7 g/dL (ref 13.2–17.1)
MCH: 30.8 pg (ref 27.0–33.0)
MCHC: 33.6 g/dL (ref 32.0–36.0)
MCV: 91.8 fL (ref 80.0–100.0)
MPV: 12.4 fL (ref 7.5–12.5)
Platelets: 154 10*3/uL (ref 140–400)
RBC: 4.77 10*6/uL (ref 4.20–5.80)
RDW: 14.5 % (ref 11.0–15.0)
WBC: 6 10*3/uL (ref 3.8–10.8)

## 2020-01-12 LAB — COMPREHENSIVE METABOLIC PANEL
AG Ratio: 1.7 (calc) (ref 1.0–2.5)
ALT: 20 U/L (ref 9–46)
AST: 20 U/L (ref 10–35)
Albumin: 4 g/dL (ref 3.6–5.1)
Alkaline phosphatase (APISO): 63 U/L (ref 35–144)
BUN: 22 mg/dL (ref 7–25)
CO2: 27 mmol/L (ref 20–32)
Calcium: 10.1 mg/dL (ref 8.6–10.3)
Chloride: 106 mmol/L (ref 98–110)
Creat: 1.11 mg/dL (ref 0.70–1.18)
Globulin: 2.4 g/dL (calc) (ref 1.9–3.7)
Glucose, Bld: 89 mg/dL (ref 65–99)
Potassium: 5 mmol/L (ref 3.5–5.3)
Sodium: 140 mmol/L (ref 135–146)
Total Bilirubin: 0.5 mg/dL (ref 0.2–1.2)
Total Protein: 6.4 g/dL (ref 6.1–8.1)

## 2020-01-12 LAB — LDL CHOLESTEROL, DIRECT: Direct LDL: 81 mg/dL (ref <100)

## 2020-01-12 LAB — URIC ACID: Uric Acid, Serum: 6.4 mg/dL (ref 4.0–8.0)

## 2020-01-12 LAB — TSH: TSH: 1.28 mIU/L (ref 0.40–4.50)

## 2020-01-15 ENCOUNTER — Other Ambulatory Visit: Payer: Self-pay | Admitting: Family Medicine

## 2020-03-12 ENCOUNTER — Other Ambulatory Visit: Payer: Self-pay | Admitting: Family Medicine

## 2020-05-01 ENCOUNTER — Other Ambulatory Visit: Payer: Self-pay | Admitting: Family Medicine

## 2020-05-01 NOTE — Telephone Encounter (Signed)
Medication refill request for Crestor.  01/11/20 Last OV  01/07/20 Last refill date // #90 // refill -1  Medication refill request approved and submitted to pharmacy per standing orders.

## 2020-05-19 ENCOUNTER — Ambulatory Visit: Payer: Medicare Other | Admitting: Podiatry

## 2020-05-19 ENCOUNTER — Telehealth: Payer: Self-pay | Admitting: Family Medicine

## 2020-05-19 NOTE — Telephone Encounter (Signed)
Virtual would be best.

## 2020-05-19 NOTE — Telephone Encounter (Signed)
Please advise message below. Will patient need virtual visit?

## 2020-05-19 NOTE — Telephone Encounter (Signed)
Appointment scheduled for virtual visit.

## 2020-05-19 NOTE — Telephone Encounter (Signed)
Patient wife called and stated that patient tested for Covid last week and wanted to see if there is something he can take to improve symptoms. Per wife pt has been having been fatigue and cough, please advise. CB is [phone]

## 2020-05-20 ENCOUNTER — Other Ambulatory Visit: Payer: Self-pay | Admitting: Physician Assistant

## 2020-05-20 ENCOUNTER — Telehealth (INDEPENDENT_AMBULATORY_CARE_PROVIDER_SITE_OTHER): Payer: Medicare Other | Admitting: Family Medicine

## 2020-05-20 ENCOUNTER — Encounter: Payer: Self-pay | Admitting: Family Medicine

## 2020-05-20 VITALS — Wt 235.0 lb

## 2020-05-20 DIAGNOSIS — U071 COVID-19: Secondary | ICD-10-CM

## 2020-05-20 DIAGNOSIS — I480 Paroxysmal atrial fibrillation: Secondary | ICD-10-CM

## 2020-05-20 DIAGNOSIS — I10 Essential (primary) hypertension: Secondary | ICD-10-CM

## 2020-05-20 NOTE — Patient Instructions (Signed)
-  stay home while sick, and if you have [place] please stay home for a full 10 days since the onset of symptoms PLUS one day of no fever and feeling better  -call number provided for outpatient COVID19 treatment center [phone]  -can use tylenol or aleve if needed for fevers, aches and pains per instructions  -can use nasal saline a few times per day if nasal congestion  -stay hydrated, drink plenty of fluids and eat small healthy meals - avoid dairy  -can take 1000 IU Vit D3 and Vit C lozenges per instructions  -check out the CDC website for more information on home care, transmission and treatment for COVID19  -follow up with your doctor in 2-3 days unless improving and feeling better  I hope you are feeling better soon! Seek in-person care or a follow up telemedicine visit promptly if your symptoms worsen, new concerns arise or you are not improving as expected. Call 911 if severe symptoms.

## 2020-05-20 NOTE — Progress Notes (Signed)
I connected by phone with [name] on 05/20/2020 at 4:43 PM to discuss the potential use of a new treatment for mild to moderate COVID-19 viral infection in non-hospitalized patients.  This patient is a 74 y.o. male that meets the FDA criteria for Emergency Use Authorization of COVID monoclonal antibody casirivimab/imdevimab.  Has a (+) direct SARS-CoV-2 viral test result  Has mild or moderate COVID-19   Is NOT hospitalized due to COVID-19  Is within 10 days of symptom onset  Has at least one of the high risk factor(s) for progression to severe COVID-19 and/or hospitalization as defined in EUA.  Specific high risk criteria : Older age (>/= 74 yo), BMI > 25 and Cardiovascular disease or hypertension   I have spoken and communicated the following to the patient or parent/caregiver regarding COVID monoclonal antibody treatment:  1. FDA has authorized the emergency use for the treatment of mild to moderate COVID-19 in adults and pediatric patients with positive results of direct SARS-CoV-2 viral testing who are 79 years of age and older weighing at least 40 kg, and who are at high risk for progressing to severe COVID-19 and/or hospitalization.  2. The significant known and potential risks and benefits of COVID monoclonal antibody, and the extent to which such potential risks and benefits are unknown.  3. Information on available alternative treatments and the risks and benefits of those alternatives, including clinical trials.  4. Patients treated with COVID monoclonal antibody should continue to self-isolate and use infection control measures (e.g., wear mask, isolate, social distance, avoid sharing personal items, clean and disinfect "high touch" surfaces, and frequent handwashing) according to CDC guidelines.   5. The patient or parent/caregiver has the option to accept or refuse COVID monoclonal antibody treatment.  After reviewing this information with the patient, The patient agreed  to proceed with receiving casirivimab\imdevimab infusion and will be provided a copy of the Fact sheet prior to receiving the infusion.  Sx onset 9/12. Set up for infusion on 9/22 @ 2:30pm. Directions given to [facility]. Pt is aware that insurance will be charged an infusion fee. Pt is fully vaccinated with pfizer.   Angelena Form 05/20/2020 4:43 PM

## 2020-05-20 NOTE — Progress Notes (Signed)
Virtual Visit via Video Note  I connected with [name]  on 05/20/20 at 12:00 PM EDT by a video enabled telemedicine application and verified that I am speaking with the correct person using two identifiers.  Location patient: home Location provider:work or home office Persons participating in the virtual visit: patient, provider  I discussed the limitations of evaluation and management by telemedicine and the availability of in person appointments. The patient expressed understanding and agreed to proceed.   HPI:  Acute telemedicine visit for COVID-19 infection: -Onset: 05/11/2020 -Tested positive for Covid on 05/14/2020 -Symptoms include: occasional cough, Reports his only symptom currently is fatigue, felt a little lightheaded at times, mild diarrhea initially -Denies: fevers, HA, CP, SOB, palpitations, vomiting, inability to get out of bed, inability to eat/drink -Has tried: tylenol initially - has not required anything recently -Pertinent past medical history:HTN, Obesity -Pertinent medication allergies: nkda  -COVID-19 vaccine status: fully vaccinated, pfizer - 2nd dose in March  ROS: See pertinent positives and negatives per HPI.  Past Medical History:  Diagnosis Date  . High cholesterol   . Hypertension   . Thyroid disease     History reviewed. No pertinent surgical history.   Current Outpatient Medications:  .  allopurinol (ZYLOPRIM) 100 MG tablet, TAKE 1 TABLET(100 MG) BY MOUTH TWICE DAILY, Disp: 180 tablet, Rfl: 2 .  aspirin 81 MG chewable tablet, Chew 1 tablet by mouth daily., Disp: , Rfl:  .  carvedilol (COREG) 25 MG tablet, Take 1 tablet (25 mg total) by mouth 2 (two) times daily., Disp: 180 tablet, Rfl: 2 .  Coenzyme Q10 (COQ10 PO), Take by mouth., Disp: , Rfl:  .  folic acid (FOLVITE) 1 MG tablet, Take 1 tablet (1 mg total) by mouth daily., Disp: 90 tablet, Rfl: 2 .  indomethacin (INDOCIN) 25 MG capsule, Take by mouth as needed., Disp: , Rfl:  .  levothyroxine  (SYNTHROID) 75 MCG tablet, TAKE 1 TABLET(75 MCG) BY MOUTH DAILY, Disp: 90 tablet, Rfl: 2 .  rosuvastatin (CRESTOR) 10 MG tablet, TAKE 1 TABLET(10 MG) BY MOUTH DAILY, Disp: 90 tablet, Rfl: 3  EXAM:  VITALS per patient if applicable:  GENERAL: alert, oriented, appears well and in no acute distress  HEENT: atraumatic, conjunttiva clear, no obvious abnormalities on inspection of external nose and ears  NECK: normal movements of the head and neck  LUNGS: on inspection no signs of respiratory distress, breathing rate appears normal, no obvious gross SOB, gasping or wheezing, no cough during this visit  CV: no obvious cyanosis  MS: moves all visible extremities without noticeable abnormality  PSYCH/NEURO: pleasant and cooperative, no obvious depression or anxiety, speech and thought processing grossly intact  ASSESSMENT AND PLAN:  Discussed the following assessment and plan:  Infection due to 2019 novel coronavirus  -we discussed possible serious and likely etiologies, options for evaluation and workup, limitations of telemedicine visit vs in person visit, treatment, treatment risks and precautions. Pt prefers to treat via telemedicine empirically rather then risking or undertaking an in person visit at this moment.  Discussed the typical course of [id number], potential complications, precautions, treatment options, home care and isolation.  Advised staying hydrated and discussed options for cough.  He currently does not feel that he needs a prescription cough medication.  He is interested in Mab treatment.  Sent referral to the Mab infusion center.  He is almost out of the treatment window and did discuss this.  He has had the COVID-19 vaccines. Scheduled follow up with PCP offered:  He does wish to schedule follow-up with his PCP office in 2 to 3 days.  Sent message to schedulers to assist and advised patient to contact PCP office to schedule if does not receive call back in next 24  hours. Advised to seek prompt follow up telemedicine visit or in person care if worsening, new symptoms arise, or if is not improving with treatment. Did let this patient know that I only do telemedicine on Tuesdays and Thursdays for [place]. Advised to schedule follow up visit with PCP or [facility] if any further questions or concerns to avoid delays in care.   I discussed the assessment and treatment plan with the patient. The patient was provided an opportunity to ask questions and all were answered. The patient agreed with the plan and demonstrated an understanding of the instructions.     Lucretia Kern, DO

## 2020-05-21 ENCOUNTER — Other Ambulatory Visit (HOSPITAL_COMMUNITY): Payer: Self-pay

## 2020-05-21 ENCOUNTER — Ambulatory Visit (HOSPITAL_COMMUNITY)
Admission: RE | Admit: 2020-05-21 | Discharge: 2020-05-21 | Disposition: A | Discharge status: Home / Self Care | Payer: Medicare Other | Source: Ambulatory Visit | Attending: Pulmonary Disease | Admitting: Pulmonary Disease

## 2020-05-21 DIAGNOSIS — Z23 Encounter for immunization: Secondary | ICD-10-CM | POA: Insufficient documentation

## 2020-05-21 DIAGNOSIS — I10 Essential (primary) hypertension: Secondary | ICD-10-CM | POA: Diagnosis present

## 2020-05-21 DIAGNOSIS — U071 COVID-19: Secondary | ICD-10-CM | POA: Insufficient documentation

## 2020-05-21 MED ORDER — FAMOTIDINE IN NACL 20-0.9 MG/50ML-% IV SOLN: 20.0000 mg | Freq: Once | INTRAVENOUS | Status: DC | PRN

## 2020-05-21 MED ORDER — SODIUM CHLORIDE 0.9 % IV SOLN: INTRAVENOUS | Status: DC | PRN

## 2020-05-21 MED ORDER — ALBUTEROL SULFATE HFA 108 (90 BASE) MCG/ACT IN AERS: 2.0000 | INHALATION_SPRAY | Freq: Once | RESPIRATORY_TRACT | Status: DC | PRN

## 2020-05-21 MED ORDER — DIPHENHYDRAMINE HCL 50 MG/ML IJ SOLN: 50.0000 mg | Freq: Once | INTRAMUSCULAR | Status: DC | PRN

## 2020-05-21 MED ORDER — METHYLPREDNISOLONE SODIUM SUCC 125 MG IJ SOLR: 125.0000 mg | Freq: Once | INTRAMUSCULAR | Status: DC | PRN

## 2020-05-21 MED ORDER — SODIUM CHLORIDE 0.9 % IV SOLN
1200.0000 mg | Freq: Once | INTRAVENOUS | Status: AC
  Administered 2020-05-21: 1200 mg via INTRAVENOUS

## 2020-05-21 MED ORDER — EPINEPHRINE 0.3 MG/0.3ML IJ SOAJ: 0.3000 mg | Freq: Once | INTRAMUSCULAR | Status: DC | PRN

## 2020-05-21 NOTE — Discharge Instructions (Signed)

## 2020-05-21 NOTE — Progress Notes (Signed)
  Diagnosis: COVID-19  Physician: Dr. Asencion Noble   Procedure: [facility] Med: casirivimab\imdevimab infusion - Provided patient with casirivimab\imdevimab fact sheet for patients, parents and caregivers prior to infusion.  Complications: No immediate complications noted.  Discharge: Discharged home   [name] 05/21/2020

## 2020-07-07 ENCOUNTER — Other Ambulatory Visit (HOSPITAL_COMMUNITY): Payer: Medicare Other

## 2020-07-16 ENCOUNTER — Other Ambulatory Visit: Payer: Self-pay | Admitting: Family Medicine

## 2020-07-22 ENCOUNTER — Telehealth: Payer: Self-pay | Admitting: Family Medicine

## 2020-07-22 NOTE — Telephone Encounter (Signed)
Patient is calling to check the status of his surgical clearance form. Please give him a call back at [phone] and advise.

## 2020-07-28 NOTE — Telephone Encounter (Signed)
Patient scheduled for RT knee replacement on 08/11/20 with [facility] w/Dr Alusio.   Advised that I will try and locate form and if not will call them to have them refax to [place].  Dm/cma

## 2020-07-28 NOTE — Telephone Encounter (Signed)
After 2 phone calls, spoke to [employer] [facility], and patient is good to go for surgery and that the new PA that works there didn't look far enough back into the chart to see the clearance information. Dm/cma

## 2020-07-30 NOTE — Patient Instructions (Signed)
DUE TO COVID-19 ONLY ONE VISITOR IS ALLOWED TO COME WITH YOU AND STAY IN THE WAITING ROOM ONLY DURING PRE OP AND PROCEDURE DAY OF SURGERY. THE 1 VISITOR  MAY VISIT WITH YOU AFTER SURGERY IN YOUR PRIVATE ROOM DURING VISITING HOURS ONLY!  YOU NEED TO HAVE A COVID 19 TEST ON_12/9______ @_______ , THIS TEST MUST BE DONE BEFORE SURGERY,  COVID TESTING SITE [place] [id number], IT IS ON THE RIGHT GOING OUT [address] APPROXIMATELY  2 MINUTES PAST [employer] ON THE RIGHT. ONCE YOUR COVID TEST IS COMPLETED,  PLEASE BEGIN THE QUARANTINE INSTRUCTIONS AS OUTLINED IN YOUR HANDOUT.                [name]    Your procedure is scheduled on: 08/11/20   Report to [facility] Main  Entrance   Report to admitting at   10:10 AM     Call this number if you have problems the morning of surgery [place], NO CHEWING GUM [place].  No food after midnight.    You may have clear liquid until 9:30 AM  .  At 9:00  AM drink pre surgery drink.   Nothing by mouth after 9:30 AM.    Take these medicines the morning of surgery with A SIP OF WATER: Carvedilol, Allopurinol, Levothyroxine                                You may not have any metal on your body including              piercings  Do not wear jewelry,  lotions, powders or deodorant              Men may shave face and neck.   Do not bring valuables to the hospital. [place].  Contacts, dentures or bridgework may not be worn into surgery.                    Please read over the following fact sheets you were given: _____________________________________________________________________             [facility] - Preparing for Surgery Before surgery, you can play an important role .  Because skin is not sterile, your skin needs to be as free of germs as possible.   You can  reduce the number of germs on your skin by washing with CHG (chlorahexidine gluconate) soap before surgery.   CHG is an antiseptic cleaner which kills germs and bonds with the skin to continue killing germs even after washing. Please DO NOT use if you have an allergy to CHG or antibacterial soaps.   If your skin becomes reddened/irritated stop using the CHG and inform your nurse when you arrive at Short Stay.  You may shave your face/neck. Please follow these instructions carefully:  1.  Shower with CHG Soap the night before surgery and the  morning of Surgery.  2.  If you choose to wash your hair, wash your hair first as usual with your  normal  shampoo.  3.  After you shampoo, rinse your hair and body thoroughly to remove the  shampoo.  4.  Use CHG as you would any other liquid soap.  You can apply chg directly  to the skin and wash                       Gently with a scrungie or clean washcloth.  5.  Apply the CHG Soap to your body ONLY FROM THE NECK DOWN.   Do not use on face/ open                           Wound or open sores. Avoid contact with eyes, ears mouth and genitals (private parts).                       Wash face,  Genitals (private parts) with your normal soap.             6.  Wash thoroughly, paying special attention to the area where your surgery  will be performed.  7.  Thoroughly rinse your body with warm water from the neck down.  8.  DO NOT shower/wash with your normal soap after using and rinsing off  the CHG Soap.             9.  Pat yourself dry with a clean towel.            10.  Wear clean pajamas.            11.  Place clean sheets on your bed the night of your first shower and do not  sleep with pets. Day of Surgery : Do not apply any lotions/deodorants the morning of surgery.  Please wear clean clothes to the hospital/surgery center.  FAILURE TO FOLLOW THESE INSTRUCTIONS MAY RESULT IN THE CANCELLATION OF YOUR SURGERY PATIENT  SIGNATURE_________________________________  NURSE SIGNATURE__________________________________  ________________________________________________________________________   [name]  An incentive spirometer is a tool that can help keep your lungs clear and active. This tool measures how well you are filling your lungs with each breath. Taking long deep breaths may help reverse or decrease the chance of developing breathing (pulmonary) problems (especially infection) following:  A long period of time when you are unable to move or be active. BEFORE THE PROCEDURE   If the spirometer includes an indicator to show your best effort, your nurse or respiratory therapist will set it to a desired goal.  If possible, sit up straight or lean slightly forward. Try not to slouch.  Hold the incentive spirometer in an upright position. INSTRUCTIONS FOR USE  1. Sit on the edge of your bed if possible, or sit up as far as you can in bed or on a chair. 2. Hold the incentive spirometer in an upright position. 3. Breathe out normally. 4. Place the mouthpiece in your mouth and seal your lips tightly around it. 5. Breathe in slowly and as deeply as possible, raising the piston or the ball toward the top of the column. 6. Hold your breath for 3-5 seconds or for as long as possible. Allow the piston or ball to fall to the bottom of the column. 7. Remove the mouthpiece from your mouth and breathe out normally. 8. Rest for a few seconds and repeat Steps 1 through 7 at least 10 times every 1-2 hours when you are awake. Take your time and take a few normal breaths between deep breaths. 9. The spirometer may include an indicator to show your best effort.  Use the indicator as a goal to work toward during each repetition. 10. After each set of 10 deep breaths, practice coughing to be sure your lungs are clear. If you have an incision (the cut made at the time of surgery), support your incision when coughing  by placing a pillow or rolled up towels firmly against it. Once you are able to get out of bed, walk around indoors and cough well. You may stop using the incentive spirometer when instructed by your caregiver.  RISKS AND COMPLICATIONS  Take your time so you do not get dizzy or light-headed.  If you are in pain, you may need to take or ask for pain medication before doing incentive spirometry. It is harder to take a deep breath if you are having pain. AFTER USE  Rest and breathe slowly and easily.  It can be helpful to keep track of a log of your progress. Your caregiver can provide you with a simple table to help with this. If you are using the spirometer at home, follow these instructions: [place] IF:   You are having difficultly using the spirometer.  You have trouble using the spirometer as often as instructed.  Your pain medication is not giving enough relief while using the spirometer.  You develop fever of 100.5 F (38.1 C) or higher. SEEK IMMEDIATE MEDICAL CARE IF:   You cough up bloody sputum that had not been present before.  You develop fever of 102 F (38.9 C) or greater.  You develop worsening pain at or near the incision site. MAKE SURE YOU:   Understand these instructions.  Will watch your condition.  Will get help right away if you are not doing well or get worse. Document Released: 12/27/2006 Document Revised: 11/08/2011 Document Reviewed: 02/27/2007 [facility] Patient Information 2014 [place], [place].   ________________________________________________________________________

## 2020-08-01 ENCOUNTER — Encounter (HOSPITAL_COMMUNITY)
Admission: RE | Admit: 2020-08-01 | Discharge: 2020-08-01 | Disposition: A | Discharge status: Home / Self Care | Payer: Medicare Other | Source: Ambulatory Visit | Attending: Orthopedic Surgery | Admitting: Orthopedic Surgery

## 2020-08-01 ENCOUNTER — Encounter (HOSPITAL_COMMUNITY): Payer: Self-pay

## 2020-08-01 ENCOUNTER — Other Ambulatory Visit: Payer: Self-pay

## 2020-08-01 DIAGNOSIS — Z01818 Encounter for other preprocedural examination: Secondary | ICD-10-CM | POA: Insufficient documentation

## 2020-08-01 DIAGNOSIS — I10 Essential (primary) hypertension: Secondary | ICD-10-CM | POA: Diagnosis not present

## 2020-08-01 HISTORY — DX: Hypothyroidism, unspecified: E03.9

## 2020-08-01 HISTORY — DX: Unspecified osteoarthritis, unspecified site: M19.90

## 2020-08-01 LAB — COMPREHENSIVE METABOLIC PANEL
ALT: 18 U/L (ref 0–44)
AST: 23 U/L (ref 15–41)
Albumin: 3.9 g/dL (ref 3.5–5.0)
Alkaline Phosphatase: 63 U/L (ref 38–126)
Anion gap: 9 (ref 5–15)
BUN: 16 mg/dL (ref 8–23)
CO2: 27 mmol/L (ref 22–32)
Calcium: 9.7 mg/dL (ref 8.9–10.3)
Chloride: 106 mmol/L (ref 98–111)
Creatinine, Ser: 1.06 mg/dL (ref 0.61–1.24)
GFR, Estimated: >60 mL/min (ref >60)
Glucose, Bld: 142 mg/dL — ABNORMAL HIGH (ref 70–99)
Potassium: 4.5 mmol/L (ref 3.5–5.1)
Sodium: 142 mmol/L (ref 135–145)
Total Bilirubin: 0.5 mg/dL (ref 0.3–1.2)
Total Protein: 6.7 g/dL (ref 6.5–8.1)

## 2020-08-01 LAB — CBC
HCT: 43.4 % (ref 39.0–52.0)
Hemoglobin: 13.9 g/dL (ref 13.0–17.0)
MCH: 30 pg (ref 26.0–34.0)
MCHC: 32 g/dL (ref 30.0–36.0)
MCV: 93.7 fL (ref 80.0–100.0)
Platelets: 147 10*3/uL — ABNORMAL LOW (ref 150–400)
RBC: 4.63 MIL/uL (ref 4.22–5.81)
RDW: 14.8 % (ref 11.5–15.5)
WBC: 4.8 10*3/uL (ref 4.0–10.5)
nRBC: 0 % (ref 0.0–0.2)

## 2020-08-01 LAB — PROTIME-INR
INR: 1 (ref 0.8–1.2)
Prothrombin Time: 12.6 seconds (ref 11.4–15.2)

## 2020-08-01 LAB — SURGICAL PCR SCREEN
MRSA, PCR: NEGATIVE
Staphylococcus aureus: NEGATIVE

## 2020-08-01 LAB — APTT: aPTT: 29 seconds (ref 24–36)

## 2020-08-01 NOTE — Progress Notes (Addendum)
I called all of the Pt's phone numbers and left a message . I told him the we will reschedule his appointment. Pt responded and PAT was done over the phone.
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
## 2020-08-07 ENCOUNTER — Other Ambulatory Visit (HOSPITAL_COMMUNITY)
Admission: RE | Admit: 2020-08-07 | Discharge: 2020-08-07 | Disposition: A | Discharge status: Home / Self Care | Payer: Medicare Other | Source: Ambulatory Visit | Attending: Orthopedic Surgery | Admitting: Orthopedic Surgery

## 2020-08-07 DIAGNOSIS — Z20822 Contact with and (suspected) exposure to covid-19: Secondary | ICD-10-CM | POA: Insufficient documentation

## 2020-08-07 DIAGNOSIS — Z01812 Encounter for preprocedural laboratory examination: Secondary | ICD-10-CM | POA: Diagnosis present

## 2020-08-07 LAB — SARS CORONAVIRUS 2 (TAT 6-24 HRS): SARS Coronavirus 2: NEGATIVE

## 2020-08-08 NOTE — H&P (Signed)
TOTAL KNEE ADMISSION H&P  Patient is being admitted for right total knee arthroplasty.  Subjective:  Chief Complaint: Right knee pain.  HPI: [name], 74 y.o. male has a history of pain and functional disability in the right knee due to arthritis and has failed non-surgical conservative treatments for greater than 12 weeks to include NSAID's and/or analgesics and activity modification. Onset of symptoms was gradual, starting several years ago with gradually worsening course since that time. The patient noted prior procedures on the knee to include  menisectomy on the right knee.  Patient currently rates pain in the right knee at 5 out of 10 with activity. Patient has worsening of pain with activity and weight bearing and crepitus. AP and lateral of the bilateral knees dated 04/02/2020 demonstrate severe bone-on-bone arthritis in all three compartments of the right knee. On the left, he is near bone-on-bone in the medial and patellofemoral compartment. There is no active infection.  Patient Active Problem List   Diagnosis Date Noted  . Essential hypertension 01/11/2020  . Onychomycosis 10/12/2019  . Hypothyroidism 10/12/2019  . Elevated cholesterol 10/12/2019  . Thrombocytopenia ([place]) 05/03/2019  . Gout 05/03/2019  . Screen for colon cancer 05/03/2019  . Personal history of prostate cancer 05/03/2019  . Need for influenza vaccination 05/03/2019    Past Medical History:  Diagnosis Date  . Arthritis    knees. back  . Cancer ([place]) 2018  . High cholesterol   . Hypertension   . Hypothyroidism   . Thyroid disease     Past Surgical History:  Procedure Laterality Date  . EYE SURGERY Bilateral 2014  . HERNIA REPAIR Right 1984   inguinal  . KNEE ARTHROSCOPY Left 1966  . PENILE PROSTHESIS IMPLANT  2014  . TRANSURETHRAL RESECTION OF BLADDER TUMOR  2018    Prior to Admission medications   Medication Sig Start Date End Date Taking? Authorizing Provider  acetaminophen (TYLENOL) 500  MG tablet Take 1,500 mg by mouth every 8 (eight) hours as needed for moderate pain.   Yes [provider]  allopurinol (ZYLOPRIM) 100 MG tablet TAKE 1 TABLET(100 MG) BY MOUTH TWICE DAILY Patient taking differently: Take 100 mg by mouth 2 (two) times daily.  03/12/20  Yes Libby Maw, MD  carvedilol (COREG) 25 MG tablet TAKE 1 TABLET(25 MG) BY MOUTH TWICE DAILY Patient taking differently: Take 25 mg by mouth in the morning and at bedtime.  07/16/20  Yes Dutch Quint B, FNP  Coenzyme Q10 (COQ10 PO) Take 15 mLs by mouth every other day.    Yes [provider]  FIBER ADULT GUMMIES PO Take 2 capsules by mouth daily.   Yes [provider]  folic acid (FOLVITE) 376 MCG tablet Take 400 mcg by mouth daily.   Yes [provider]  gabapentin (NEURONTIN) 300 MG capsule Take 300 mg by mouth 3 (three) times daily.   Yes [provider]  ibuprofen (ADVIL) 200 MG tablet Take 400 mg by mouth every 8 (eight) hours as needed for moderate pain.   Yes [provider]  indomethacin (INDOCIN) 25 MG capsule Take 25 mg by mouth daily as needed (gout flare).    Yes [provider]  levothyroxine (SYNTHROID) 75 MCG tablet TAKE 1 TABLET(75 MCG) BY MOUTH DAILY Patient taking differently: Take 75 mcg by mouth daily.  01/15/20  Yes Libby Maw, MD  Multiple Vitamin (MULTIVITAMIN WITH MINERALS) TABS tablet Take 1 tablet by mouth daily.   Yes [provider]  OVER THE COUNTER MEDICATION Take 2 tablets by mouth daily. Neuronol otc supplement   Yes [provider]  rosuvastatin (CRESTOR) 10 MG tablet TAKE 1 TABLET(10 MG) BY MOUTH DAILY Patient taking differently: Take 10 mg by mouth daily.  05/01/20  Yes Libby Maw, MD  terbinafine (LAMISIL) 250 MG tablet Take 250 mg by mouth daily.   Yes [provider]  folic acid (FOLVITE) 1 MG tablet Take 1 tablet (1 mg total) by mouth daily. Patient not taking: Reported on  07/30/2020 05/09/19   Libby Maw, MD    No Known Allergies  Social History   Socioeconomic History  . Marital status: Married    Spouse name: Not on file  . Number of children: Not on file  . Years of education: Not on file  . Highest education level: Not on file  Occupational History  . Not on file  Tobacco Use  . Smoking status: Never Smoker  . Smokeless tobacco: Never Used  Vaping Use  . Vaping Use: Some days  . Start date: 08/01/2017  . Substances: Mixture of cannabinoids  Substance and Sexual Activity  . Alcohol use: Yes    Alcohol/week: 2.0 standard drinks    Types: 2 Standard drinks or equivalent per week  . Drug use: Yes    Types: Marijuana  . Sexual activity: Not on file  Other Topics Concern  . Not on file  Social History Narrative  . Not on file   Social Determinants of Health   Financial Resource Strain: Not on file  Food Insecurity: Not on file  Transportation Needs: Not on file  Physical Activity: Not on file  Stress: Not on file  Social Connections: Not on file  Intimate Partner Violence: Not on file    Tobacco Use: Low Risk   . Smoking Tobacco Use: Never Smoker  . Smokeless Tobacco Use: Never Used   Social History   Substance and Sexual Activity  Alcohol Use Yes  . Alcohol/week: 2.0 standard drinks  . Types: 2 Standard drinks or equivalent per week    No family history on file.  ROS: Constitutional: no fever, no chills, no night sweats, no significant weight loss  Cardiovascular: no chest pain, no palpitations  Respiratory: no cough, no shortness of breath, No COPD  Gastrointestinal: no vomiting, no nausea  Musculoskeletal: no swelling in Joints, Joint Pain  Neurologic: no numbness, no tingling, no difficulty with balance  Objective:  Physical Exam: Well nourished and well developed.  General: Alert and oriented x3, cooperative and pleasant, no acute distress.  Head: normocephalic, atraumatic, neck supple.  Eyes:  EOMI.  Respiratory: breath sounds clear in all fields, no wheezing, rales, or rhonchi. Cardiovascular: Regular rate and rhythm, no murmurs, gallops or rubs.  Abdomen: non-tender to palpation and soft, normoactive bowel sounds. Musculoskeletal: Right Knee Exam:  No effusion present. No swelling present.  The range of motion is: 10 to 115 degrees.  Moderate crepitus on range of motion of the knee.  Positive medial joint line tenderness.  No lateral joint line tenderness.  The knee is stable.   Calves soft and nontender. Motor function intact in LE. Strength 5/5 LE bilaterally. Neuro: Distal pulses 2+. Sensation to light touch intact in LE.  Vital signs in last 24 hours:    Imaging Review Plain radiographs demonstrate AP and lateral of the bilateral knees dated 04/02/2020 demonstrate severe bone-on-bone arthritis in all three compartments of the right knee. On the left, he is near bone-on-bone  in the medial and patellofemoral compartment.   Assessment/Plan:  End stage arthritis, right knee   The patient history, physical examination, clinical judgment of the provider and imaging studies are consistent with end stage degenerative joint disease of the right knee and total knee arthroplasty is deemed medically necessary. The treatment options including medical management, injection therapy arthroscopy and arthroplasty were discussed at length. The risks and benefits of total knee arthroplasty were presented and reviewed. The risks due to aseptic loosening, infection, stiffness, patella tracking problems, thromboembolic complications and other imponderables were discussed. The patient acknowledged the explanation, agreed to proceed with the plan and consent was signed. Patient is being admitted for inpatient treatment for surgery, pain control, PT, OT, prophylactic antibiotics, VTE prophylaxis, progressive ambulation and ADLs and discharge planning. The patient is planning to be discharged home  with wife..   Patient's anticipated LOS is less than 2 midnights, meeting these requirements: - Younger than 36 - Lives within 1 hour of care - Has a competent adult at home to recover with post-op recover - NO history of  - Chronic pain requiring opiods  - Diabetes  - Coronary Artery Disease  - Heart failure  - Heart attack  - Stroke  - DVT/VTE  - Cardiac arrhythmia  - Respiratory Failure/COPD  - Renal failure  - Anemia  - Advanced Liver disease  Therapy Plans: Outpatient therapy at [facility] Disposition: home with Wife Planned DVT Prophylaxis: Xarelto (Hx of CA) DME Needed: [facility]. 3-in-1 PCP: Dr. Ethelene Hal (Received) TXA: IV Allergies: None Anesthesia Concerns: None BMI: 33.9 Last HgbA1c: N/A  Pharmacy: [facility]  Other: Hx of Prostate CA - has incontinence Urologist recommends thinnest/smallest catheter possible, due to penile implant. Requests slight head elevation on the table in the OR.  - Patient was instructed on what medications to stop prior to surgery. - Follow-up visit in 2 weeks with Dr. Wynelle Link - Begin physical therapy following surgery - Pre-operative lab work as pre-surgical testing - Prescriptions will be provided in hospital at time of discharge  Fenton Foy, [facility], PA-C Orthopedic Surgery [facility] [place]

## 2020-08-10 MED ORDER — BUPIVACAINE LIPOSOME 1.3 % IJ SUSP
20.0000 mL | Freq: Once | INTRAMUSCULAR | Status: DC
  Filled 2020-08-10: qty 20

## 2020-08-11 ENCOUNTER — Observation Stay (HOSPITAL_COMMUNITY)
Admission: RE | Admit: 2020-08-11 | Discharge: 2020-08-12 | Disposition: A | Discharge status: Home / Self Care | Payer: Medicare Other | Attending: Orthopedic Surgery | Admitting: Orthopedic Surgery

## 2020-08-11 ENCOUNTER — Ambulatory Visit (HOSPITAL_COMMUNITY): Payer: Medicare Other | Admitting: Anesthesiology

## 2020-08-11 ENCOUNTER — Encounter (HOSPITAL_COMMUNITY)
Admission: RE | Disposition: A | Discharge status: Home / Self Care | Payer: Self-pay | Source: Home / Self Care | Attending: Orthopedic Surgery

## 2020-08-11 ENCOUNTER — Encounter (HOSPITAL_COMMUNITY): Payer: Self-pay | Admitting: Orthopedic Surgery

## 2020-08-11 ENCOUNTER — Other Ambulatory Visit: Payer: Self-pay

## 2020-08-11 DIAGNOSIS — Z8546 Personal history of malignant neoplasm of prostate: Secondary | ICD-10-CM | POA: Insufficient documentation

## 2020-08-11 DIAGNOSIS — M179 Osteoarthritis of knee, unspecified: Secondary | ICD-10-CM | POA: Diagnosis present

## 2020-08-11 DIAGNOSIS — E039 Hypothyroidism, unspecified: Secondary | ICD-10-CM | POA: Diagnosis not present

## 2020-08-11 DIAGNOSIS — Z79899 Other long term (current) drug therapy: Secondary | ICD-10-CM | POA: Diagnosis not present

## 2020-08-11 DIAGNOSIS — I10 Essential (primary) hypertension: Secondary | ICD-10-CM | POA: Insufficient documentation

## 2020-08-11 DIAGNOSIS — M1711 Unilateral primary osteoarthritis, right knee: Principal | ICD-10-CM | POA: Insufficient documentation

## 2020-08-11 DIAGNOSIS — M25561 Pain in right knee: Secondary | ICD-10-CM | POA: Diagnosis present

## 2020-08-11 HISTORY — PX: TOTAL KNEE ARTHROPLASTY: SHX125

## 2020-08-11 LAB — TYPE AND SCREEN
ABO/RH(D): B POS
Antibody Screen: NEGATIVE

## 2020-08-11 LAB — ABO/RH: ABO/RH(D): B POS

## 2020-08-11 SURGERY — ARTHROPLASTY, KNEE, TOTAL
Anesthesia: Spinal | Site: Knee | Laterality: Right

## 2020-08-11 MED ORDER — LEVOTHYROXINE SODIUM 75 MCG PO TABS
75.0000 ug | ORAL_TABLET | Freq: Every day | ORAL | Status: DC
  Administered 2020-08-12: 06:00:00 75 ug via ORAL
  Filled 2020-08-11: qty 1

## 2020-08-11 MED ORDER — FLEET ENEMA 7-19 GM/118ML RE ENEM: 1.0000 | ENEMA | Freq: Once | RECTAL | Status: DC | PRN

## 2020-08-11 MED ORDER — LACTATED RINGERS IV SOLN: INTRAVENOUS | Status: DC

## 2020-08-11 MED ORDER — MIDAZOLAM HCL 2 MG/2ML IJ SOLN
1.0000 mg | Freq: Once | INTRAMUSCULAR | Status: DC
  Filled 2020-08-11: qty 2

## 2020-08-11 MED ORDER — BUPIVACAINE HCL (PF) 0.5 % IJ SOLN
INTRAMUSCULAR | Status: DC | PRN
  Administered 2020-08-11: 20 mL via PERINEURAL

## 2020-08-11 MED ORDER — CHLORHEXIDINE GLUCONATE 0.12 % MT SOLN: 15.0000 mL | Freq: Once | OROMUCOSAL | Status: DC

## 2020-08-11 MED ORDER — OXYCODONE HCL 5 MG PO TABS
5.0000 mg | ORAL_TABLET | ORAL | Status: DC | PRN
  Administered 2020-08-12: 10 mg via ORAL
  Filled 2020-08-11: qty 2

## 2020-08-11 MED ORDER — DEXAMETHASONE SODIUM PHOSPHATE 10 MG/ML IJ SOLN
8.0000 mg | Freq: Once | INTRAMUSCULAR | Status: AC
  Administered 2020-08-11: 13:00:00 10 mg via INTRAVENOUS

## 2020-08-11 MED ORDER — MENTHOL 3 MG MT LOZG: 1.0000 | LOZENGE | OROMUCOSAL | Status: DC | PRN

## 2020-08-11 MED ORDER — ROSUVASTATIN CALCIUM 10 MG PO TABS
10.0000 mg | ORAL_TABLET | Freq: Every day | ORAL | Status: DC
  Administered 2020-08-12: 10:00:00 10 mg via ORAL
  Filled 2020-08-11: qty 1

## 2020-08-11 MED ORDER — PROPOFOL 500 MG/50ML IV EMUL
INTRAVENOUS | Status: DC | PRN
  Administered 2020-08-11: 75 ug/kg/min via INTRAVENOUS

## 2020-08-11 MED ORDER — PHENOL 1.4 % MT LIQD: 1.0000 | OROMUCOSAL | Status: DC | PRN

## 2020-08-11 MED ORDER — FENTANYL CITRATE (PF) 100 MCG/2ML IJ SOLN
INTRAMUSCULAR | Status: DC | PRN
  Administered 2020-08-11: 100 ug via INTRAVENOUS

## 2020-08-11 MED ORDER — METOCLOPRAMIDE HCL 5 MG/ML IJ SOLN: 5.0000 mg | Freq: Three times a day (TID) | INTRAMUSCULAR | Status: DC | PRN

## 2020-08-11 MED ORDER — MORPHINE SULFATE (PF) 2 MG/ML IV SOLN: 0.5000 mg | INTRAVENOUS | Status: DC | PRN

## 2020-08-11 MED ORDER — SODIUM CHLORIDE (PF) 0.9 % IJ SOLN
INTRAMUSCULAR | Status: AC
  Filled 2020-08-11: qty 50

## 2020-08-11 MED ORDER — ACETAMINOPHEN 10 MG/ML IV SOLN
1000.0000 mg | Freq: Four times a day (QID) | INTRAVENOUS | Status: DC
  Administered 2020-08-11: 13:00:00 1000 mg via INTRAVENOUS
  Filled 2020-08-11: qty 100

## 2020-08-11 MED ORDER — METHOCARBAMOL 500 MG IVPB - SIMPLE MED
INTRAVENOUS | Status: AC
  Filled 2020-08-11: qty 50

## 2020-08-11 MED ORDER — CARVEDILOL 25 MG PO TABS
25.0000 mg | ORAL_TABLET | Freq: Two times a day (BID) | ORAL | Status: DC
  Administered 2020-08-12: 08:00:00 25 mg via ORAL
  Filled 2020-08-11: qty 1

## 2020-08-11 MED ORDER — ACETAMINOPHEN 500 MG PO TABS
1000.0000 mg | ORAL_TABLET | Freq: Four times a day (QID) | ORAL | Status: AC
  Administered 2020-08-11 – 2020-08-12 (×4): 1000 mg via ORAL
  Filled 2020-08-11 (×4): qty 2

## 2020-08-11 MED ORDER — ORAL CARE MOUTH RINSE: 15.0000 mL | Freq: Once | OROMUCOSAL | Status: DC

## 2020-08-11 MED ORDER — TRANEXAMIC ACID-NACL 1000-0.7 MG/100ML-% IV SOLN
1000.0000 mg | INTRAVENOUS | Status: AC
  Administered 2020-08-11: 13:00:00 1000 mg via INTRAVENOUS
  Filled 2020-08-11: qty 100

## 2020-08-11 MED ORDER — EPHEDRINE 5 MG/ML INJ
INTRAVENOUS | Status: AC
  Filled 2020-08-11: qty 10

## 2020-08-11 MED ORDER — SODIUM CHLORIDE 0.9 % IV SOLN: INTRAVENOUS | Status: DC

## 2020-08-11 MED ORDER — SODIUM CHLORIDE (PF) 0.9 % IJ SOLN
INTRAMUSCULAR | Status: DC | PRN
  Administered 2020-08-11: 60 mL

## 2020-08-11 MED ORDER — BUPIVACAINE LIPOSOME 1.3 % IJ SUSP
INTRAMUSCULAR | Status: DC | PRN
  Administered 2020-08-11: 20 mL

## 2020-08-11 MED ORDER — RIVAROXABAN 10 MG PO TABS
10.0000 mg | ORAL_TABLET | Freq: Every day | ORAL | Status: DC
  Administered 2020-08-12: 08:00:00 10 mg via ORAL
  Filled 2020-08-11: qty 1

## 2020-08-11 MED ORDER — STERILE WATER FOR IRRIGATION IR SOLN
Status: DC | PRN
  Administered 2020-08-11: 1000 mL

## 2020-08-11 MED ORDER — POVIDONE-IODINE 10 % EX SWAB: 2.0000 "application " | Freq: Once | CUTANEOUS | Status: DC

## 2020-08-11 MED ORDER — HYDROMORPHONE HCL 1 MG/ML IJ SOLN: 0.2500 mg | INTRAMUSCULAR | Status: DC | PRN

## 2020-08-11 MED ORDER — METOCLOPRAMIDE HCL 5 MG PO TABS: 5.0000 mg | ORAL_TABLET | Freq: Three times a day (TID) | ORAL | Status: DC | PRN

## 2020-08-11 MED ORDER — METHOCARBAMOL 500 MG PO TABS: 500.0000 mg | ORAL_TABLET | Freq: Four times a day (QID) | ORAL | Status: DC | PRN

## 2020-08-11 MED ORDER — DOCUSATE SODIUM 100 MG PO CAPS
100.0000 mg | ORAL_CAPSULE | Freq: Two times a day (BID) | ORAL | Status: DC
  Administered 2020-08-11 – 2020-08-12 (×2): 100 mg via ORAL
  Filled 2020-08-11 (×2): qty 1

## 2020-08-11 MED ORDER — ONDANSETRON HCL 4 MG/2ML IJ SOLN: 4.0000 mg | Freq: Once | INTRAMUSCULAR | Status: DC | PRN

## 2020-08-11 MED ORDER — CEFAZOLIN SODIUM-DEXTROSE 2-4 GM/100ML-% IV SOLN
2.0000 g | Freq: Four times a day (QID) | INTRAVENOUS | Status: AC
  Administered 2020-08-11 – 2020-08-12 (×2): 2 g via INTRAVENOUS
  Filled 2020-08-11 (×2): qty 100

## 2020-08-11 MED ORDER — TRAMADOL HCL 50 MG PO TABS
50.0000 mg | ORAL_TABLET | Freq: Four times a day (QID) | ORAL | Status: DC | PRN
Start: 2020-08-11 — End: 2020-08-12

## 2020-08-11 MED ORDER — FENTANYL CITRATE (PF) 100 MCG/2ML IJ SOLN
50.0000 ug | Freq: Once | INTRAMUSCULAR | Status: AC
  Administered 2020-08-11: 12:00:00 100 ug via INTRAVENOUS
  Filled 2020-08-11: qty 2

## 2020-08-11 MED ORDER — POLYETHYLENE GLYCOL 3350 17 G PO PACK
17.0000 g | PACK | Freq: Every day | ORAL | Status: DC | PRN
Start: 2020-08-11 — End: 2020-08-12

## 2020-08-11 MED ORDER — METHOCARBAMOL 500 MG IVPB - SIMPLE MED
500.0000 mg | Freq: Four times a day (QID) | INTRAVENOUS | Status: DC | PRN
  Administered 2020-08-11: 15:00:00 500 mg via INTRAVENOUS
  Filled 2020-08-11: qty 50

## 2020-08-11 MED ORDER — BISACODYL 10 MG RE SUPP: 10.0000 mg | Freq: Every day | RECTAL | Status: DC | PRN

## 2020-08-11 MED ORDER — GABAPENTIN 300 MG PO CAPS
300.0000 mg | ORAL_CAPSULE | Freq: Three times a day (TID) | ORAL | Status: DC
  Administered 2020-08-11 – 2020-08-12 (×3): 300 mg via ORAL
  Filled 2020-08-11 (×3): qty 1

## 2020-08-11 MED ORDER — ONDANSETRON HCL 4 MG/2ML IJ SOLN
INTRAMUSCULAR | Status: DC | PRN
  Administered 2020-08-11: 4 mg via INTRAVENOUS

## 2020-08-11 MED ORDER — DEXAMETHASONE SODIUM PHOSPHATE 10 MG/ML IJ SOLN
10.0000 mg | Freq: Once | INTRAMUSCULAR | Status: AC
  Administered 2020-08-12: 10:00:00 10 mg via INTRAVENOUS
  Filled 2020-08-11: qty 1

## 2020-08-11 MED ORDER — FENTANYL CITRATE (PF) 100 MCG/2ML IJ SOLN
INTRAMUSCULAR | Status: AC
  Filled 2020-08-11: qty 2

## 2020-08-11 MED ORDER — 0.9 % SODIUM CHLORIDE (POUR BTL) OPTIME
TOPICAL | Status: DC | PRN
  Administered 2020-08-11: 14:00:00 1000 mL

## 2020-08-11 MED ORDER — EPHEDRINE SULFATE 50 MG/ML IJ SOLN
INTRAMUSCULAR | Status: DC | PRN
  Administered 2020-08-11 (×3): 10 mg via INTRAVENOUS

## 2020-08-11 MED ORDER — PROPOFOL 1000 MG/100ML IV EMUL
INTRAVENOUS | Status: AC
  Filled 2020-08-11: qty 100

## 2020-08-11 MED ORDER — DIPHENHYDRAMINE HCL 12.5 MG/5ML PO ELIX
12.5000 mg | ORAL_SOLUTION | ORAL | Status: DC | PRN
Start: 2020-08-11 — End: 2020-08-12

## 2020-08-11 MED ORDER — SODIUM CHLORIDE (PF) 0.9 % IJ SOLN
INTRAMUSCULAR | Status: AC
  Filled 2020-08-11: qty 10

## 2020-08-11 MED ORDER — CEFAZOLIN SODIUM-DEXTROSE 2-4 GM/100ML-% IV SOLN
2.0000 g | INTRAVENOUS | Status: AC
  Administered 2020-08-11: 13:00:00 2 g via INTRAVENOUS
  Filled 2020-08-11: qty 100

## 2020-08-11 MED ORDER — ALLOPURINOL 100 MG PO TABS
100.0000 mg | ORAL_TABLET | Freq: Two times a day (BID) | ORAL | Status: DC
  Administered 2020-08-11 – 2020-08-12 (×2): 100 mg via ORAL
  Filled 2020-08-11 (×2): qty 1

## 2020-08-11 MED ORDER — ONDANSETRON HCL 4 MG PO TABS: 4.0000 mg | ORAL_TABLET | Freq: Four times a day (QID) | ORAL | Status: DC | PRN

## 2020-08-11 MED ORDER — ONDANSETRON HCL 4 MG/2ML IJ SOLN: 4.0000 mg | Freq: Four times a day (QID) | INTRAMUSCULAR | Status: DC | PRN

## 2020-08-11 MED ORDER — SODIUM CHLORIDE 0.9 % IR SOLN
Status: DC | PRN
  Administered 2020-08-11: 1000 mL

## 2020-08-11 SURGICAL SUPPLY — 54 items
ATTUNE MED DOME PAT 41 KNEE (Knees) ×2 IMPLANT
ATTUNE MED DOME PAT 41MM KNEE (Knees) ×1 IMPLANT
ATTUNE PS FEM RT SZ 7 CEM KNEE (Femur) ×3 IMPLANT
ATTUNE PSRP INSR SZ7 8 KNEE (Insert) ×2 IMPLANT
ATTUNE PSRP INSR SZ7 8MM KNEE (Insert) ×1 IMPLANT
BAG ZIPLOCK 12X15 (MISCELLANEOUS) ×3 IMPLANT
BASE TIBIAL ROT PLAT SZ 7 KNEE (Knees) ×1 IMPLANT
BLADE SAG 18X100X1.27 (BLADE) ×3 IMPLANT
BLADE SAW SGTL 11.0X1.19X90.0M (BLADE) ×3 IMPLANT
BLADE SURG SZ10 CARB STEEL (BLADE) ×6 IMPLANT
BNDG ELASTIC 6X5.8 VLCR STR LF (GAUZE/BANDAGES/DRESSINGS) ×3 IMPLANT
BOWL SMART MIX CTS (DISPOSABLE) ×3 IMPLANT
CEMENT HV SMART SET (Cement) ×6 IMPLANT
CLOSURE WOUND 1/2 X4 (GAUZE/BANDAGES/DRESSINGS) ×1
COVER SURGICAL LIGHT HANDLE (MISCELLANEOUS) ×3 IMPLANT
COVER WAND RF STERILE (DRAPES) IMPLANT
CUFF TOURN SGL QUICK 34 (TOURNIQUET CUFF) ×2
CUFF TRNQT CYL 34X4.125X (TOURNIQUET CUFF) ×1 IMPLANT
DECANTER SPIKE VIAL GLASS SM (MISCELLANEOUS) ×3 IMPLANT
DRAPE U-SHAPE 47X51 STRL (DRAPES) ×3 IMPLANT
DRSG AQUACEL AG ADV 3.5X10 (GAUZE/BANDAGES/DRESSINGS) ×3 IMPLANT
DURAPREP 26ML APPLICATOR (WOUND CARE) ×3 IMPLANT
ELECT REM PT RETURN 15FT ADLT (MISCELLANEOUS) ×3 IMPLANT
GLOVE BIO SURGEON STRL SZ7 (GLOVE) ×3 IMPLANT
GLOVE BIO SURGEON STRL SZ8 (GLOVE) ×3 IMPLANT
GLOVE BIOGEL PI IND STRL 7.0 (GLOVE) ×1 IMPLANT
GLOVE BIOGEL PI IND STRL 8 (GLOVE) ×1 IMPLANT
GLOVE BIOGEL PI INDICATOR 7.0 (GLOVE) ×2
GLOVE BIOGEL PI INDICATOR 8 (GLOVE) ×2
GOWN STRL REUS W/TWL LRG LVL3 (GOWN DISPOSABLE) ×6 IMPLANT
HANDPIECE INTERPULSE COAX TIP (DISPOSABLE) ×2
HOLDER FOLEY CATH W/STRAP (MISCELLANEOUS) IMPLANT
IMMOBILIZER KNEE 20 (SOFTGOODS) ×3
IMMOBILIZER KNEE 20 THIGH 36 (SOFTGOODS) ×1 IMPLANT
KIT TURNOVER KIT A (KITS) IMPLANT
MANIFOLD NEPTUNE II (INSTRUMENTS) ×3 IMPLANT
NS IRRIG 1000ML POUR BTL (IV SOLUTION) ×3 IMPLANT
PACK TOTAL KNEE CUSTOM (KITS) ×3 IMPLANT
PADDING CAST COTTON 6X4 STRL (CAST SUPPLIES) ×3 IMPLANT
PENCIL SMOKE EVACUATOR (MISCELLANEOUS) ×3 IMPLANT
PIN DRILL FIX HALF THREAD (BIT) ×3 IMPLANT
PIN STEINMAN FIXATION KNEE (PIN) ×3 IMPLANT
PROTECTOR NERVE ULNAR (MISCELLANEOUS) ×3 IMPLANT
SET HNDPC FAN SPRY TIP SCT (DISPOSABLE) ×1 IMPLANT
STRIP CLOSURE SKIN 1/2X4 (GAUZE/BANDAGES/DRESSINGS) ×2 IMPLANT
SUT MNCRL AB 4-0 PS2 18 (SUTURE) ×3 IMPLANT
SUT STRATAFIX 0 PDS 27 VIOLET (SUTURE) ×3
SUT VIC AB 2-0 CT1 27 (SUTURE) ×6
SUT VIC AB 2-0 CT1 TAPERPNT 27 (SUTURE) ×3 IMPLANT
SUTURE STRATFX 0 PDS 27 VIOLET (SUTURE) ×1 IMPLANT
TIBIAL BASE ROT PLAT SZ 7 KNEE (Knees) ×3 IMPLANT
TRAY FOLEY MTR SLVR 16FR STAT (SET/KITS/TRAYS/PACK) ×3 IMPLANT
WATER STERILE IRR 1000ML POUR (IV SOLUTION) ×6 IMPLANT
WRAP KNEE MAXI GEL POST OP (GAUZE/BANDAGES/DRESSINGS) ×3 IMPLANT

## 2020-08-11 NOTE — Progress Notes (Signed)
Orthopedic Tech Progress Note Patient Details:  [name] Jun 26, 1946 [id number]  CPM Right Knee CPM Right Knee: On Right Knee Flexion (Degrees): 40 Right Knee Extension (Degrees): 10  Post Interventions Patient Tolerated: Well Instructions Provided: Care of device,Adjustment of device  [name] 08/11/2020, 3:05 PM

## 2020-08-11 NOTE — Anesthesia Postprocedure Evaluation (Signed)
Anesthesia Post Note  Patient: [name]  Procedure(s) Performed: TOTAL KNEE ARTHROPLASTY (Right Knee)     Patient location during evaluation: PACU Anesthesia Type: Spinal Level of consciousness: oriented and awake and alert Pain management: pain level controlled Vital Signs Assessment: post-procedure vital signs reviewed and stable Respiratory status: spontaneous breathing, respiratory function stable and patient connected to nasal cannula oxygen Cardiovascular status: blood pressure returned to baseline and stable Postop Assessment: no headache, no backache and no apparent nausea or vomiting Anesthetic complications: no   No complications documented.  Last Vitals:  Vitals:   08/11/20 1600 08/11/20 1609  BP:  127/81  Pulse: (!) 54 (!) 42  Resp: 13 13  Temp:  36.4 C  SpO2: 100% 100%    Last Pain:  Vitals:   08/11/20 1609  TempSrc:   PainSc: 0-No pain                 Consetta Cosner S

## 2020-08-11 NOTE — Anesthesia Procedure Notes (Signed)
Anesthesia Regional Block: Adductor canal block   Pre-Anesthetic Checklist: ,, timeout performed, Correct Patient, Correct Site, Correct Laterality, Correct Procedure, Correct Position, site marked, Risks and benefits discussed,  Surgical consent,  Pre-op evaluation,  At surgeon's request and post-op pain management  Laterality: Right  Prep: chloraprep       Needles:  Injection technique: Single-shot  Needle Type: Echogenic Needle     Needle Length: 9cm      Additional Needles:   Procedures:,,,, ultrasound used (permanent image in chart),,,,  Narrative:  Start time: 08/11/2020 12:21 PM End time: 08/11/2020 12:27 PM Injection made incrementally with aspirations every 5 mL.  Performed by: Personally  Anesthesiologist: Myrtie Soman, MD  Additional Notes: Patient tolerated the procedure well without complications

## 2020-08-11 NOTE — Discharge Instructions (Addendum)
Information on my medicine - XARELTO (Rivaroxaban)  This medication education was reviewed with me or my healthcare representative as part of my discharge preparation.  Why was Xarelto prescribed for you? Xarelto was prescribed for you to reduce the risk of blood clots forming after orthopedic surgery. The medical term for these abnormal blood clots is venous thromboembolism (VTE).  What do you need to know about xarelto ? Take your Xarelto ONCE DAILY at the same time every day. You may take it either with or without food.  If you have difficulty swallowing the tablet whole, you may crush it and mix in applesauce just prior to taking your dose.  Take Xarelto exactly as prescribed by your doctor and DO NOT stop taking Xarelto without talking to the doctor who prescribed the medication.  Stopping without other VTE prevention medication to take the place of Xarelto may increase your risk of developing a clot.  After discharge, you should have regular check-up appointments with your healthcare provider that is prescribing your Xarelto.    What do you do if you miss a dose? If you miss a dose, take it as soon as you remember on the same day then continue your regularly scheduled once daily regimen the next day. Do not take two doses of Xarelto on the same day.   Important Safety Information A possible side effect of Xarelto is bleeding. You should call your healthcare provider right away if you experience any of the following: ? Bleeding from an injury or your nose that does not stop. ? Unusual colored urine (red or dark brown) or unusual colored stools (red or black). ? Unusual bruising for unknown reasons. ? A serious fall or if you hit your head (even if there is no bleeding).  Some medicines may interact with Xarelto and might increase your risk of bleeding while on Xarelto. To help avoid this, consult your healthcare provider or pharmacist prior to using any new prescription or  non-prescription medications, including herbals, vitamins, non-steroidal anti-inflammatory drugs (NSAIDs) and supplements.  This website has more information on Xarelto: [email or web address].[email or web address]     Gaynelle Arabian, MD Total [facility] Specialist [facility] [place] [address], [address] [place], [place] [address] [phone]  TOTAL KNEE REPLACEMENT POSTOPERATIVE DIRECTIONS    Knee Rehabilitation, Guidelines Following Surgery  Results after knee surgery are often greatly improved when you follow the exercise, range of motion and muscle strengthening exercises prescribed by your doctor. Safety measures are also important to protect the knee from further injury. If any of these exercises cause you to have increased pain or swelling in your knee joint, decrease the amount until you are comfortable again and slowly increase them. If you have problems or questions, call your caregiver or physical therapist for advice.   BLOOD CLOT PREVENTION . Take a 10 mg Xarelto once a day for three weeks following surgery. Then take an 81 mg Aspirin once a day for three weeks. Then discontinue Aspirin. . You may resume your vitamins/supplements once you have discontinued the Xarelto. . Do not take any NSAIDs (Advil, Aleve, Ibuprofen, Meloxicam, etc.) until you have discontinued the Xarelto.   HOME CARE INSTRUCTIONS  . Remove items at home which could result in a fall. This includes throw rugs or furniture in walking pathways.  . ICE to the affected knee as much as tolerated. Icing helps control swelling. If the swelling is well controlled you will be more comfortable and rehab easier. Continue to use ice on the knee  for pain and swelling from surgery. You may notice swelling that will progress down to the foot and ankle. This is normal after surgery. Elevate the leg when you are not up walking on it.    . Continue to use the breathing machine which will help keep your temperature down. It is common for your  temperature to cycle up and down following surgery, especially at night when you are not up moving around and exerting yourself. The breathing machine keeps your lungs expanded and your temperature down. . Do not place pillow under the operative knee, focus on keeping the knee straight while resting  DIET You may resume your previous home diet once you are discharged from the hospital.  DRESSING / [place] / SHOWERING . Keep your bulky bandage on for 2 days. On the third post-operative day you may remove the Ace bandage and gauze. There is a waterproof adhesive bandage on your skin which will stay in place until your first follow-up appointment. Once you remove this you will not need to place another bandage . You may begin showering 3 days following surgery, but do not submerge the incision under water.  ACTIVITY For the first 5 days, the key is rest and control of pain and swelling . Do your home exercises twice a day starting on post-operative day 3. On the days you go to physical therapy, just do the home exercises once that day. . You should rest, ice and elevate the leg for 50 minutes out of every hour. Get up and walk/stretch for 10 minutes per hour. After 5 days you can increase your activity slowly as tolerated. . Walk with your walker as instructed. Use the walker until you are comfortable transitioning to a cane. Walk with the cane in the opposite hand of the operative leg. You may discontinue the cane once you are comfortable and walking steadily. . Avoid periods of inactivity such as sitting longer than an hour when not asleep. This helps prevent blood clots.  . You may discontinue the knee immobilizer once you are able to perform a straight leg raise while lying down. . You may resume a sexual relationship in one month or when given the OK by your doctor.  . You may return to work once you are cleared by your doctor.  . Do not drive a car for 6 weeks or until released by your  surgeon.  . Do not drive while taking narcotics.  TED HOSE STOCKINGS Wear the elastic stockings on both legs for three weeks following surgery during the day. You may remove them at night for sleeping.  WEIGHT BEARING Weight bearing as tolerated with assist device (walker, cane, etc) as directed, use it as long as suggested by your surgeon or therapist, typically at least 4-6 weeks.  POSTOPERATIVE CONSTIPATION PROTOCOL Constipation - defined medically as fewer than three stools per week and severe constipation as less than one stool per week.  One of the most common issues patients have following surgery is constipation.  Even if you have a regular bowel pattern at home, your normal regimen is likely to be disrupted due to multiple reasons following surgery.  Combination of anesthesia, postoperative narcotics, change in appetite and fluid intake all can affect your bowels.  In order to avoid complications following surgery, here are some recommendations in order to help you during your recovery period.  . Colace (docusate) - Pick up an over-the-counter form of Colace or another stool softener and take twice  a day as long as you are requiring postoperative pain medications.  Take with a full glass of water daily.  If you experience loose stools or diarrhea, hold the colace until you stool forms back up. If your symptoms do not get better within 1 week or if they get worse, check with your doctor. . Dulcolax (bisacodyl) - Pick up over-the-counter and take as directed by the product packaging as needed to assist with the movement of your bowels.  Take with a full glass of water.  Use this product as needed if not relieved by Colace only.  . MiraLax (polyethylene glycol) - Pick up over-the-counter to have on hand. MiraLax is a solution that will increase the amount of water in your bowels to assist with bowel movements.  Take as directed and can mix with a glass of water, juice, soda, coffee, or tea.  Take if you go more than two days without a movement. Do not use MiraLax more than once per day. Call your doctor if you are still constipated or irregular after using this medication for 7 days in a row.  If you continue to have problems with postoperative constipation, please contact the office for further assistance and recommendations.  If you experience "the worst abdominal pain ever" or develop nausea or vomiting, please contact the office immediatly for further recommendations for treatment.  ITCHING If you experience itching with your medications, try taking only a single pain pill, or even half a pain pill at a time.  You can also use Benadryl over the counter for itching or also to help with sleep.   MEDICATIONS See your medication summary on the "After Visit Summary" that the nursing staff will review with you prior to discharge.  You may have some home medications which will be placed on hold until you complete the course of blood thinner medication.  It is important for you to complete the blood thinner medication as prescribed by your surgeon.  Continue your approved medications as instructed at time of discharge.  PRECAUTIONS . If you experience chest pain or shortness of breath - call 911 immediately for transfer to the hospital emergency department.  . If you develop a fever greater that 101 F, purulent drainage from wound, increased redness or drainage from wound, foul odor from the wound/dressing, or calf pain - CONTACT YOUR SURGEON.                                                   FOLLOW-UP APPOINTMENTS Make sure you keep all of your appointments after your operation with your surgeon and caregivers. You should call the office at the above phone number and make an appointment for approximately two weeks after the date of your surgery or on the date instructed by your surgeon outlined in the "After Visit Summary".  RANGE OF MOTION AND STRENGTHENING EXERCISES  Rehabilitation of  the knee is important following a knee injury or an operation. After just a few days of immobilization, the muscles of the thigh which control the knee become weakened and shrink (atrophy). Knee exercises are designed to build up the tone and strength of the thigh muscles and to improve knee motion. Often times heat used for twenty to thirty minutes before working out will loosen up your tissues and help with improving the range of motion but  do not use heat for the first two weeks following surgery. These exercises can be done on a training (exercise) mat, on the floor, on a table or on a bed. Use what ever works the best and is most comfortable for you Knee exercises include:  . Leg Lifts - While your knee is still immobilized in a splint or cast, you can do straight leg raises. Lift the leg to 60 degrees, hold for 3 sec, and slowly lower the leg. Repeat 10-20 times 2-3 times daily. Perform this exercise against resistance later as your knee gets better.  Javier Docker and Hamstring Sets - Tighten up the muscle on the front of the thigh (Quad) and hold for 5-10 sec. Repeat this 10-20 times hourly. Hamstring sets are done by pushing the foot backward against an object and holding for 5-10 sec. Repeat as with quad sets.   Leg Slides: Lying on your back, slowly slide your foot toward your buttocks, bending your knee up off the floor (only go as far as is comfortable). Then slowly slide your foot back down until your leg is flat on the floor again.  Angel Wings: Lying on your back spread your legs to the side as far apart as you can without causing discomfort.  A rehabilitation program following serious knee injuries can speed recovery and prevent re-injury in the future due to weakened muscles. Contact your doctor or a physical therapist for more information on knee rehabilitation.   IF YOU ARE TRANSFERRED TO A SKILLED REHAB FACILITY If the patient is transferred to a skilled rehab facility following release from  the hospital, a list of the current medications will be sent to the facility for the patient to continue.  When discharged from the skilled rehab facility, please have the facility set up the patient's [place] prior to being released. Also, the skilled facility will be responsible for providing the patient with their medications at time of release from the facility to include their pain medication, the muscle relaxants, and their blood thinner medication. If the patient is still at the rehab facility at time of the two week follow up appointment, the skilled rehab facility will also need to assist the patient in arranging follow up appointment in our office and any transportation needs.  MAKE SURE YOU:  . Understand these instructions.  . Get help right away if you are not doing well or get worse.   DENTAL ANTIBIOTICS:  In most cases prophylactic antibiotics for Dental procdeures after total joint surgery are not necessary.  Exceptions are as follows:  1. History of prior total joint infection  2. Severely immunocompromised (Organ Transplant, cancer chemotherapy, Rheumatoid biologic meds such as [place])  3. Poorly controlled diabetes (A1C &gt; 8.0, blood glucose over 200)  If you have one of these conditions, contact your surgeon for an antibiotic prescription, prior to your dental procedure.    Pick up stool softner and laxative for home use following surgery while on pain medications. Do not submerge incision under water. Please use good hand washing techniques while changing dressing each day. May shower starting three days after surgery. Please use a clean towel to pat the incision dry following showers. Continue to use ice for pain and swelling after surgery. Do not use any lotions or creams on the incision until instructed by your surgeon.

## 2020-08-11 NOTE — Anesthesia Preprocedure Evaluation (Signed)
Anesthesia Evaluation  Patient identified by MRN, date of birth, ID band Patient awake    Reviewed: Allergy & Precautions, H&P , NPO status , Patient's Chart, lab work & pertinent test results  Airway Mallampati: II  TM Distance: >3 FB Neck ROM: Full    Dental no notable dental hx.    Pulmonary neg pulmonary ROS,    Pulmonary exam normal breath sounds clear to auscultation       Cardiovascular hypertension, Pt. on medications Normal cardiovascular exam Rhythm:Regular Rate:Normal     Neuro/Psych negative neurological ROS  negative psych ROS   GI/Hepatic negative GI ROS, Neg liver ROS,   Endo/Other  Hypothyroidism   Renal/GU negative Renal ROS  negative genitourinary   Musculoskeletal  (+) Arthritis , Osteoarthritis,    Abdominal   Peds negative pediatric ROS (+)  Hematology negative hematology ROS (+)   Anesthesia Other Findings   Reproductive/Obstetrics negative OB ROS                             Anesthesia Physical Anesthesia Plan  ASA: II  Anesthesia Plan: Spinal   Post-op Pain Management:  [facility] for Post-op pain   Induction: Intravenous  PONV Risk Score and Plan: 2 and Ondansetron, Dexamethasone and Treatment may vary due to age or medical condition  Airway Management Planned: Simple Face Mask  Additional Equipment:   Intra-op Plan:   Post-operative Plan:   Informed Consent: I have reviewed the patients History and Physical, chart, labs and discussed the procedure including the risks, benefits and alternatives for the proposed anesthesia with the patient or authorized representative who has indicated his/her understanding and acceptance.     Dental advisory given  Plan Discussed with: CRNA and Surgeon  Anesthesia Plan Comments:         Anesthesia Quick Evaluation

## 2020-08-11 NOTE — Anesthesia Procedure Notes (Signed)
Anesthesia Procedure Image    

## 2020-08-11 NOTE — Progress Notes (Signed)
Assisted Dr. Rose with right, ultrasound guided, adductor canal block. Side rails up, monitors on throughout procedure. See vital signs in flow sheet. Tolerated Procedure well.  

## 2020-08-11 NOTE — Plan of Care (Signed)
Plan of care reviewed and discussed with the patient. 

## 2020-08-11 NOTE — Evaluation (Signed)
Physical Therapy Evaluation Patient Details Name: [name] MRN: [id number] DOB: 06-30-1946 Today's Date: 08/11/2020   History of Present Illness  Patient is 74 y.o. male s/p Rt TKA on 08/11/20 with PMH significant for HTN, hypothyroidism, OA.  Clinical Impression  [name] is a 74 y.o. male POD 0 s/p Rt TKA. Patient reports independence with mobility at baseline. Patient is now limited by functional impairments (see PT problem list below) and requires min assist for transfers and gait with RW. Patient was able to ambulate ~70 feet with RW and min assist. Patient instructed in exercise to facilitate ROM and circulation. Patient will benefit from continued skilled PT interventions to address impairments and progress towards PLOF. Acute PT will follow to progress mobility and stair training in preparation for safe discharge home.     Follow Up Recommendations Follow surgeon's recommendation for DC plan and follow-up therapies;Outpatient PT    Equipment Recommendations  Rolling walker with 5" wheels    Recommendations for Other Services       Precautions / Restrictions Precautions Precautions: Fall Restrictions Weight Bearing Restrictions: No Other Position/Activity Restrictions: WBAT      Mobility  Bed Mobility Overal bed mobility: Needs Assistance Bed Mobility: Supine to Sit     Supine to sit: HOB elevated;Min guard     General bed mobility comments: no assist needed, pt using bed rail with cues.    Transfers Overall transfer level: Needs assistance Equipment used: Rolling walker (2 wheeled) Transfers: Sit to/from Stand Sit to Stand: Min assist         General transfer comment: cues for technique with RW, assist required to initiate and complete power up from EOB.  Ambulation/Gait Ambulation/Gait assistance: Min assist Gait Distance (Feet): 70 Feet Assistive device: Rolling walker (2 wheeled) Gait Pattern/deviations: Step-to pattern;Decreased stride  length;Decreased weight shift to right Gait velocity: decr   General Gait Details: VC's for safe step pattern and proximity to RW, pt maintained throughout, no buckling at Rt knee and no LOB.  Stairs            Wheelchair Mobility    Modified Rankin (Stroke Patients Only)       Balance Overall balance assessment: Needs assistance Sitting-balance support: Feet supported Sitting balance-Leahy Scale: Good     Standing balance support: Bilateral upper extremity supported;During functional activity Standing balance-Leahy Scale: Poor                               Pertinent Vitals/Pain Pain Assessment: 0-10 Pain Score: 0-No pain Pain Location: Rt knee Pain Intervention(s): Monitored during session    Home Living Family/patient expects to be discharged to:: Private residence Living Arrangements: Spouse/significant other;Children Available Help at Discharge: Family Type of Home: House Home Access: Stairs to enter Entrance Stairs-Rails: Can reach both Entrance Stairs-Number of Steps: 3 Home Layout: One level Home Equipment: Grab bars - tub/shower      Prior Function Level of Independence: Independent               Hand Dominance   Dominant Hand: Right    Extremity/Trunk Assessment   Upper Extremity Assessment Upper Extremity Assessment: Overall WFL for tasks assessed    Lower Extremity Assessment Lower Extremity Assessment: RLE deficits/detail RLE Deficits / Details: good quad activation, no extensor lag with SLR RLE Sensation: WNL RLE Coordination: WNL    Cervical / Trunk Assessment Cervical / Trunk Assessment: Normal  Communication   Communication: No  difficulties  Cognition Arousal/Alertness: Awake/alert Behavior During Therapy: WFL for tasks assessed/performed Overall Cognitive Status: Within Functional Limits for tasks assessed                                        General Comments      Exercises Total  Joint Exercises Ankle Circles/Pumps: AROM;Both;20 reps;Seated Quad Sets: AROM;Right;Seated;10 reps Heel Slides: AROM;Right;Seated;10 reps   Assessment/Plan    PT Assessment Patient needs continued PT services  PT Problem List Decreased strength;Decreased range of motion;Decreased balance;Decreased activity tolerance;Decreased mobility;Decreased knowledge of use of DME;Decreased knowledge of precautions       PT Treatment Interventions DME instruction;Therapeutic activities;Gait training;Stair training;Balance training;Therapeutic exercise;Functional mobility training;Patient/family education    PT Goals (Current goals can be found in the Care Plan section)  Acute Rehab PT Goals Patient Stated Goal: get back to walking PT Goal Formulation: With patient Time For Goal Achievement: 08/18/20 Potential to Achieve Goals: Good    Frequency 7X/week   Barriers to discharge        Co-evaluation               AM-PAC PT "6 Clicks" Mobility  Outcome Measure Help needed turning from your back to your side while in a flat bed without using bedrails?: None Help needed moving from lying on your back to sitting on the side of a flat bed without using bedrails?: A Little Help needed moving to and from a bed to a chair (including a wheelchair)?: A Little Help needed standing up from a chair using your arms (e.g., wheelchair or bedside chair)?: A Little Help needed to walk in hospital room?: A Little Help needed climbing 3-5 steps with a railing? : A Little 6 Click Score: 19    End of Session Equipment Utilized During Treatment: Gait belt Activity Tolerance: Patient tolerated treatment well Patient left: in chair;with call bell/phone within reach;with chair alarm set Nurse Communication: Mobility status PT Visit Diagnosis: Muscle weakness (generalized) (M62.81);Difficulty in walking, not elsewhere classified (R26.2)    Time: 7711-6579 PT Time Calculation (min) (ACUTE ONLY): 22  min   Charges:   PT Evaluation $PT Eval Low Complexity: 1 Low          Verner Mould, DPT Acute Rehabilitation Services  Office [phone] Pager [phone]  08/11/2020 6:55 PM

## 2020-08-11 NOTE — Interval H&P Note (Signed)
History and Physical Interval Note:  08/11/2020 10:39 AM  [name]  has presented today for surgery, with the diagnosis of right knee osteoarthritis.  The various methods of treatment have been discussed with the patient and family. After consideration of risks, benefits and other options for treatment, the patient has consented to  Procedure(s) with comments: TOTAL KNEE ARTHROPLASTY (Right) - 85min as a surgical intervention.  The patient's history has been reviewed, patient examined, no change in status, stable for surgery.  I have reviewed the patient's chart and labs.  Questions were answered to the patient's satisfaction.     Pilar Plate Elya Tarquinio

## 2020-08-11 NOTE — Transfer of Care (Signed)
Immediate Anesthesia Transfer of Care Note  Patient: [name]  Procedure(s) Performed: TOTAL KNEE ARTHROPLASTY (Right Knee)  Patient Location: PACU  Anesthesia Type:Spinal  Level of Consciousness: sedated, patient cooperative and responds to stimulation  Airway & Oxygen Therapy: Patient Spontanous Breathing and Patient connected to face mask oxygen  Post-op Assessment: Report given to RN and Post -op Vital signs reviewed and stable  Post vital signs: Reviewed and stable  Last Vitals:  Vitals Value Taken Time  BP 114/61 08/11/20 1455  Temp    Pulse 53 08/11/20 1457  Resp 9 08/11/20 1457  SpO2 99 % 08/11/20 1457  Vitals shown include unvalidated device data.  Last Pain:  Vitals:   08/11/20 1231  TempSrc:   PainSc: 0-No pain      Patients Stated Pain Goal: 4 (58/83/25 4982)  Complications: No complications documented.

## 2020-08-11 NOTE — Op Note (Signed)
OPERATIVE REPORT-TOTAL KNEE ARTHROPLASTY   Pre-operative diagnosis- Osteoarthritis  Right knee(s)  Post-operative diagnosis- Osteoarthritis Right knee(s)  Procedure-  Right  Total Knee Arthroplasty  Surgeon- [name] Plover. Clary Boulais, MD  Assistant- Molli Barrows, PA-C   Anesthesia-  Adductor canal block and spinal  EBL-25 mL   Drains None  Tourniquet time-  Total Tourniquet Time Documented: Thigh (Right) - 50 minutes Total: Thigh (Right) - 50 minutes     Complications- None  Condition-PACU - hemodynamically stable.   Brief Clinical Note  [name] is a 74 y.o. year old male with end stage OA of his right knee with progressively worsening pain and dysfunction. He has constant pain, with activity and at rest and significant functional deficits with difficulties even with ADLs. He has had extensive non-op management including analgesics, injections of cortisone and viscosupplements, and home exercise program, but remains in significant pain with significant dysfunction. Radiographs show bone on bone arthritis medial and patellofemoral. He presents now for right Total Knee Arthroplasty.    Procedure in detail---   The patient is brought into the operating room and positioned supine on the operating table. After successful administration of  Adductor canal block and spinal,   a tourniquet is placed high on the  Right thigh(s) and the lower extremity is prepped and draped in the usual sterile fashion. Time out is performed by the operating team and then the  Right lower extremity is wrapped in Esmarch, knee flexed and the tourniquet inflated to 300 mmHg.       A midline incision is made with a ten blade through the subcutaneous tissue to the level of the extensor mechanism. A fresh blade is used to make a medial parapatellar arthrotomy. Soft tissue over the proximal medial tibia is subperiosteally elevated to the joint line with a knife and into the semimembranosus bursa with a Cobb  elevator. Soft tissue over the proximal lateral tibia is elevated with attention being paid to avoiding the patellar tendon on the tibial tubercle. The patella is everted, knee flexed 90 degrees and the ACL and PCL are removed. Findings are bone on bone medial and patellofemoral with large global osteophytes.      The drill is used to create a starting hole in the distal femur and the canal is thoroughly irrigated with sterile saline to remove the fatty contents. The 5 degree Right  valgus alignment guide is placed into the femoral canal and the distal femoral cutting block is pinned to remove 9 mm off the distal femur. Resection is made with an oscillating saw.      The tibia is subluxed forward and the menisci are removed. The extramedullary alignment guide is placed referencing proximally at the medial aspect of the tibial tubercle and distally along the second metatarsal axis and tibial crest. The block is pinned to remove 55mm off the more deficient medial  side. Resection is made with an oscillating saw. Size 7is the most appropriate size for the tibia and the proximal tibia is prepared with the modular drill and keel punch for that size.      The femoral sizing guide is placed and size 7 is most appropriate. Rotation is marked off the epicondylar axis and confirmed by creating a rectangular flexion gap at 90 degrees. The size 7 cutting block is pinned in this rotation and the anterior, posterior and chamfer cuts are made with the oscillating saw. The intercondylar block is then placed and that cut is made.  Trial size 7 tibial component, trial size 7 posterior stabilized femur and a 8  mm posterior stabilized rotating platform insert trial is placed. Full extension is achieved with excellent varus/valgus and anterior/posterior balance throughout full range of motion. The patella is everted and thickness measured to be 27  mm. Free hand resection is taken to 15 mm, a 41 template is placed, lug holes are  drilled, trial patella is placed, and it tracks normally. Osteophytes are removed off the posterior femur with the trial in place. All trials are removed and the cut bone surfaces prepared with pulsatile lavage. Cement is mixed and once ready for implantation, the size 7 tibial implant, size  7 posterior stabilized femoral component, and the size 41 patella are cemented in place and the patella is held with the clamp. The trial insert is placed and the knee held in full extension. The Exparel (20 ml mixed with 60 ml saline) is injected into the extensor mechanism, posterior capsule, medial and lateral gutters and subcutaneous tissues.  All extruded cement is removed and once the cement is hard the permanent 8 mm posterior stabilized rotating platform insert is placed into the tibial tray.      The wound is copiously irrigated with saline solution and the extensor mechanism closed with # 0 Stratofix suture. The tourniquet is released for a total tourniquet time of 50  minutes. Flexion against gravity is 140 degrees and the patella tracks normally. Subcutaneous tissue is closed with 2.0 vicryl and subcuticular with running 4.0 Monocryl. The incision is cleaned and dried and steri-strips and a bulky sterile dressing are applied. The limb is placed into a knee immobilizer and the patient is awakened and transported to recovery in stable condition.      Please note that a surgical assistant was a medical necessity for this procedure in order to perform it in a safe and expeditious manner. Surgical assistant was necessary to retract the ligaments and vital neurovascular structures to prevent injury to them and also necessary for proper positioning of the limb to allow for anatomic placement of the prosthesis.   [name] Plover Shawndale Kilpatrick, MD    08/11/2020, 2:37 PM

## 2020-08-12 ENCOUNTER — Encounter (HOSPITAL_COMMUNITY): Payer: Self-pay | Admitting: Orthopedic Surgery

## 2020-08-12 DIAGNOSIS — M1711 Unilateral primary osteoarthritis, right knee: Secondary | ICD-10-CM | POA: Diagnosis not present

## 2020-08-12 LAB — BASIC METABOLIC PANEL
Anion gap: 9 (ref 5–15)
BUN: 18 mg/dL (ref 8–23)
CO2: 21 mmol/L — ABNORMAL LOW (ref 22–32)
Calcium: 9.2 mg/dL (ref 8.9–10.3)
Chloride: 108 mmol/L (ref 98–111)
Creatinine, Ser: 0.97 mg/dL (ref 0.61–1.24)
GFR, Estimated: >60 mL/min (ref >60)
Glucose, Bld: 155 mg/dL — ABNORMAL HIGH (ref 70–99)
Potassium: 4.3 mmol/L (ref 3.5–5.1)
Sodium: 138 mmol/L (ref 135–145)

## 2020-08-12 LAB — CBC
HCT: 38.8 % — ABNORMAL LOW (ref 39.0–52.0)
Hemoglobin: 12.7 g/dL — ABNORMAL LOW (ref 13.0–17.0)
MCH: 30.5 pg (ref 26.0–34.0)
MCHC: 32.7 g/dL (ref 30.0–36.0)
MCV: 93 fL (ref 80.0–100.0)
Platelets: 142 10*3/uL — ABNORMAL LOW (ref 150–400)
RBC: 4.17 MIL/uL — ABNORMAL LOW (ref 4.22–5.81)
RDW: 14.5 % (ref 11.5–15.5)
WBC: 7.8 10*3/uL (ref 4.0–10.5)
nRBC: 0 % (ref 0.0–0.2)

## 2020-08-12 MED ORDER — RIVAROXABAN 10 MG PO TABS: 10.0000 mg | ORAL_TABLET | Freq: Every day | ORAL | 0 refills | Status: DC

## 2020-08-12 MED ORDER — ASPIRIN EC 325 MG PO TBEC: 325.0000 mg | DELAYED_RELEASE_TABLET | Freq: Two times a day (BID) | ORAL | 0 refills | Status: AC

## 2020-08-12 MED ORDER — METHOCARBAMOL 500 MG PO TABS: 500.0000 mg | ORAL_TABLET | Freq: Four times a day (QID) | ORAL | 0 refills | Status: DC | PRN

## 2020-08-12 MED ORDER — OXYCODONE HCL 5 MG PO TABS: 5.0000 mg | ORAL_TABLET | Freq: Four times a day (QID) | ORAL | 0 refills | Status: DC | PRN

## 2020-08-12 MED ORDER — TRAMADOL HCL 50 MG PO TABS: 50.0000 mg | ORAL_TABLET | Freq: Four times a day (QID) | ORAL | 0 refills | Status: DC | PRN

## 2020-08-12 NOTE — Progress Notes (Signed)
Physical Therapy Treatment Patient Details Name: [name] MRN: [id number] DOB: 28-May-1946 Today's Date: 08/12/2020    History of Present Illness Patient is 74 y.o. male s/p Rt TKA on 08/11/20 with PMH significant for HTN, hypothyroidism, OA.    PT Comments    Pt progressing well today. Pain controlled. Reviewed, transfer safety, gait, stairs and TKA HEP. righ tknee AROM grossly 6 to 70 degrees flexion. See below for further details. Ready for d/c with family assist from PT stand point.   Follow Up Recommendations  Follow surgeon's recommendation for DC plan and follow-up therapies;Outpatient PT     Equipment Recommendations  Rolling walker with 5" wheels    Recommendations for Other Services       Precautions / Restrictions Precautions Precautions: Fall;Knee Precaution Comments: IND SLRs, KI not utilized Required Braces or Orthoses: Knee Immobilizer - Right Knee Immobilizer - Right: Discontinue once straight leg raise with < 10 degree lag Restrictions Weight Bearing Restrictions: No Other Position/Activity Restrictions: WBAT    Mobility  Bed Mobility               General bed mobility comments: in recliner on arrival  Transfers Overall transfer level: Needs assistance Equipment used: Rolling walker (2 wheeled) Transfers: Sit to/from Stand Sit to Stand: Supervision         General transfer comment: cues for hand placement and RLE position  Ambulation/Gait Ambulation/Gait assistance: Min guard;Supervision Gait Distance (Feet): 110 Feet Assistive device: Rolling walker (2 wheeled) Gait Pattern/deviations: Step-to pattern;Step-through pattern;Decreased stance time - right;Decreased stride length Gait velocity: decr   General Gait Details: cues for  RW position, initial sequence, progression to step through. slight initial unsteadiness, no overt LOB, stability improved with distance   Stairs Stairs: Yes Stairs assistance: Min guard Stair Management:  Step to pattern;Two rails;Forwards Number of Stairs: 3 General stair comments: cues for sequence, min/guard for safety   Wheelchair Mobility    Modified Rankin (Stroke Patients Only)       Balance                                            Cognition Arousal/Alertness: Awake/alert Behavior During Therapy: WFL for tasks assessed/performed Overall Cognitive Status: Within Functional Limits for tasks assessed                                        Exercises Total Joint Exercises Ankle Circles/Pumps: AROM;Both;10 reps Quad Sets: AROM;Right;10 reps Heel Slides: AROM;AAROM;Right;5 reps Straight Leg Raises: AROM;Right;5 reps    General Comments        Pertinent Vitals/Pain Pain Assessment: 0-10 Pain Score: 2  Pain Location: Rt knee Pain Descriptors / Indicators: Discomfort Pain Intervention(s): Limited activity within patient's tolerance;Monitored during session;Premedicated before session;Repositioned;Ice applied    Home Living                      Prior Function            PT Goals (current goals can now be found in the care plan section) Acute Rehab PT Goals Patient Stated Goal: get back to walking PT Goal Formulation: With patient Time For Goal Achievement: 08/18/20 Potential to Achieve Goals: Good Progress towards PT goals: Progressing toward goals    Frequency    7X/week  PT Plan Current plan remains appropriate    Co-evaluation              AM-PAC PT "6 Clicks" Mobility   Outcome Measure  Help needed turning from your back to your side while in a flat bed without using bedrails?: None Help needed moving from lying on your back to sitting on the side of a flat bed without using bedrails?: A Little Help needed moving to and from a bed to a chair (including a wheelchair)?: A Little Help needed standing up from a chair using your arms (e.g., wheelchair or bedside chair)?: A Little Help needed to  walk in hospital room?: A Little Help needed climbing 3-5 steps with a railing? : A Little 6 Click Score: 19    End of Session Equipment Utilized During Treatment: Gait belt Activity Tolerance: Patient tolerated treatment well Patient left: in chair;with call bell/phone within reach;with chair alarm set Nurse Communication: Mobility status PT Visit Diagnosis: Muscle weakness (generalized) (M62.81);Difficulty in walking, not elsewhere classified (R26.2)     Time: 1610-9604 PT Time Calculation (min) (ACUTE ONLY): 32 min  Charges:  $Gait Training: 8-22 mins $Therapeutic Exercise: 8-22 mins                     Baxter Flattery, PT  Acute Rehab Dept ([place]) [phone] Pager [phone]  08/12/2020    [facility] 08/12/2020, 12:42 PM

## 2020-08-12 NOTE — TOC Transition Note (Signed)
Transition of Care (TOC) - CM/SW Discharge Note   Patient Details  Name: Andi Bitner MRN: [id number] Date of Birth: 10/28/1945  Transition of Care (TOC) CM/SW Contact:  HOYLE, LUCY, LCSW Phone Number: 08/12/2020, 11:12 AM   Clinical Narrative:    Met briefly with pt and confirming rw and commode via [employer].  Plan for OPPT at [facility].  No further TOC needs.   Final next level of care: OP Rehab Barriers to Discharge: No Barriers Identified   Patient Goals and CMS Choice        Discharge Placement                       Discharge Plan and Services                DME Arranged: Walker rolling,3-N-1 DME Agency: [employer] (ordered prior to surgery)                  Social Determinants of Health (SDOH) Interventions     Readmission Risk Interventions No flowsheet data found.     

## 2020-08-12 NOTE — Progress Notes (Signed)
   Subjective: 1 Day Post-Op Procedure(s) (LRB): TOTAL KNEE ARTHROPLASTY (Right) Patient reports pain as mild.   Patient seen in rounds by Dr. Wynelle Link. Patient is well, and has had no acute complaints or problems. No issues overnight. Denies chest pain, SOB, or calf pain. Foley catheter removed this AM. States he is ready to go home. We will continue therapy today, ambulated 22' yesterday.   Objective: Vital signs in last 24 hours: Temp:  [97.4 F (36.3 C)-98.2 F (36.8 C)] 97.8 F (36.6 C) (12/14 0523) Pulse Rate:  [42-68] 51 (12/14 0523) Resp:  [11-18] 18 (12/14 0523) BP: (110-178)/(61-95) 154/78 (12/14 0523) SpO2:  [92 %-100 %] 100 % (12/14 0523) Weight:  [106.6 kg] 106.6 kg (12/13 1013)  Intake/Output from previous day:  Intake/Output Summary (Last 24 hours) at 08/12/2020 0734 Last data filed at 08/12/2020 0600 Gross per 24 hour  Intake 3866.68 ml  Output 2025 ml  Net 1841.68 ml     Intake/Output this shift: No intake/output data recorded.  Labs: Recent Labs    08/12/20 0310  HGB 12.7*   Recent Labs    08/12/20 0310  WBC 7.8  RBC 4.17*  HCT 38.8*  PLT 142*   Recent Labs    08/12/20 0310  NA 138  K 4.3  CL 108  CO2 21*  BUN 18  CREATININE 0.97  GLUCOSE 155*  CALCIUM 9.2   No results for input(s): LABPT, INR in the last 72 hours.  Exam: General - Patient is Alert and Oriented Extremity - Neurologically intact Neurovascular intact Sensation intact distally Dorsiflexion/Plantar flexion intact Dressing - dressing C/D/I Motor Function - intact, moving foot and toes well on exam.   Past Medical History:  Diagnosis Date  . Arthritis    knees. back  . Cancer ([place]) 2018  . High cholesterol   . Hypertension   . Hypothyroidism   . Thyroid disease     Assessment/Plan: 1 Day Post-Op Procedure(s) (LRB): TOTAL KNEE ARTHROPLASTY (Right) Principal Problem:   OA (osteoarthritis) of knee Active Problems:   Primary osteoarthritis of right  knee  Estimated body mass index is 31.87 kg/m as calculated from the following:   Height as of this encounter: 6' (1.829 m).   Weight as of this encounter: 106.6 kg. Advance diet Up with therapy D/C IV fluids   Patient's anticipated LOS is less than 2 midnights, meeting these requirements: - Younger than 39 - Lives within 1 hour of care - Has a competent adult at home to recover with post-op recover - NO history of  - Chronic pain requiring opiods  - Diabetes  - Coronary Artery Disease  - Heart failure  - Heart attack  - Stroke  - DVT/VTE  - Cardiac arrhythmia  - Respiratory Failure/COPD  - Renal failure  - Anemia  - Advanced Liver disease  DVT Prophylaxis - Xarelto Weight bearing as tolerated. Continue therapy.  Plan is to go Home after hospital stay. Plan for discharge later today if progresses with physical therapy and is meeting his goals. Scheduled for OPPT at [facility]. Follow-up in the office in 2 weeks.   The PDMP database was reviewed today prior to any opioid medications being prescribed to this patient.  Theresa Duty, PA-C Orthopedic Surgery [phone] 08/12/2020, 7:34 AM

## 2020-08-12 NOTE — Progress Notes (Signed)
Orthopedic Tech Progress Note Patient Details:  [name] 11-30-1945 [id number]  Patient ID: [name], male   DOB: 1946-08-14, 74 y.o.   MRN: [id number]   [name] 08/12/2020, 8:01 AMPickup CPM

## 2020-08-18 DIAGNOSIS — Z96651 Presence of right artificial knee joint: Secondary | ICD-10-CM | POA: Insufficient documentation

## 2020-08-20 NOTE — Discharge Summary (Signed)
Physician Discharge Summary   Patient ID: [name] MRN: [id number] DOB/AGE: [birth year] 74 y.o.  Admit date: 08/11/2020 Discharge date: 08/12/2020  Primary Diagnosis: Osteoarthritis, right knee   Admission Diagnoses:  Past Medical History:  Diagnosis Date  . Arthritis    knees. back  . Cancer ([place]) 2018  . High cholesterol   . Hypertension   . Hypothyroidism   . Thyroid disease    Discharge Diagnoses:   Principal Problem:   OA (osteoarthritis) of knee Active Problems:   Primary osteoarthritis of right knee  Estimated body mass index is 31.87 kg/m as calculated from the following:   Height as of this encounter: 6' (1.829 m).   Weight as of this encounter: 106.6 kg.  Procedure:  Procedure(s) (LRB): TOTAL KNEE ARTHROPLASTY (Right)   Consults: None  HPI: [name] is a 74 y.o. year old male with end stage OA of his right knee with progressively worsening pain and dysfunction. He has constant pain, with activity and at rest and significant functional deficits with difficulties even with ADLs. He has had extensive non-op management including analgesics, injections of cortisone and viscosupplements, and home exercise program, but remains in significant pain with significant dysfunction. Radiographs show bone on bone arthritis medial and patellofemoral. He presents now for right Total Knee Arthroplasty.    Laboratory Data: Admission on 08/11/2020, Discharged on 08/12/2020  Component Date Value Ref Range Status  . ABO/RH(D) 08/11/2020    Final                   Value:B POS Performed at [facility], [place] [address] [place], [place] [address]   . WBC 08/12/2020 7.8  4.0 - 10.5 K/uL Final  . RBC 08/12/2020 4.17* 4.22 - 5.81 MIL/uL Final  . Hemoglobin 08/12/2020 12.7* 13.0 - 17.0 g/dL Final  . HCT 08/12/2020 38.8* 39.0 - 52.0 % Final  . MCV 08/12/2020 93.0  80.0 - 100.0 fL Final  . MCH 08/12/2020 30.5  26.0 - 34.0 pg Final  . MCHC 08/12/2020 32.7  30.0  - 36.0 g/dL Final  . RDW 08/12/2020 14.5  11.5 - 15.5 % Final  . Platelets 08/12/2020 142* 150 - 400 K/uL Final  . nRBC 08/12/2020 0.0  0.0 - 0.2 % Final   Performed at [facility], [place] [address], [place], [place] [address]  . Sodium 08/12/2020 138  135 - 145 mmol/L Final  . Potassium 08/12/2020 4.3  3.5 - 5.1 mmol/L Final  . Chloride 08/12/2020 108  98 - 111 mmol/L Final  . CO2 08/12/2020 21* 22 - 32 mmol/L Final  . Glucose, Bld 08/12/2020 155* 70 - 99 mg/dL Final   Glucose reference range applies only to samples taken after fasting for at least 8 hours.  . BUN 08/12/2020 18  8 - 23 mg/dL Final  . Creatinine, Ser 08/12/2020 0.97  0.61 - 1.24 mg/dL Final  . Calcium 08/12/2020 9.2  8.9 - 10.3 mg/dL Final  . GFR, Estimated 08/12/2020 >60  >60 mL/min Final   Comment: (NOTE) Calculated using the CKD-EPI Creatinine Equation (2021)   . Anion gap 08/12/2020 9  5 - 15 Final   Performed at [facility], [place] [address] [place], [place] [address]  Hospital Outpatient Visit on 08/07/2020  Component Date Value Ref Range Status  . SARS Coronavirus 2 08/07/2020 NEGATIVE  NEGATIVE Final   Comment: (NOTE) SARS-CoV-2 target nucleic acids are NOT DETECTED.  The SARS-CoV-2 RNA is generally detectable in  upper and lower respiratory specimens during the acute phase of infection. Negative results do not preclude SARS-CoV-2 infection, do not rule out co-infections with other pathogens, and should not be used as the sole basis for treatment or other patient management decisions. Negative results must be combined with clinical observations, patient history, and epidemiological information. The expected result is Negative.  Fact Sheet for Patients: HairSlick.no  Fact Sheet for Healthcare Providers: quierodirigir.com  This test is not yet approved or cleared by the [place] FDA and  has been authorized  for detection and/or diagnosis of SARS-CoV-2 by FDA under an Emergency Use Authorization (EUA). This EUA will remain  in effect (meaning this test can be used) for the duration of the COVID-19 declaration under Se                          ction 564(b)(1) of the Act, 21 U.S.C. section 360bbb-3(b)(1), unless the authorization is terminated or revoked sooner.  Performed at [facility], [address] [place], [place] [address]   Hospital Outpatient Visit on 08/01/2020  Component Date Value Ref Range Status  . aPTT 08/01/2020 29  24 - 36 seconds Final   Performed at [facility], [address] [place], [place] [address]  . WBC 08/01/2020 4.8  4.0 - 10.5 K/uL Final  . RBC 08/01/2020 4.63  4.22 - 5.81 MIL/uL Final  . Hemoglobin 08/01/2020 13.9  13.0 - 17.0 g/dL Final  . HCT 38/45/3646 43.4  39.0 - 52.0 % Final  . MCV 08/01/2020 93.7  80.0 - 100.0 fL Final  . MCH 08/01/2020 30.0  26.0 - 34.0 pg Final  . MCHC 08/01/2020 32.0  30.0 - 36.0 g/dL Final  . RDW 80/32/1224 14.8  11.5 - 15.5 % Final  . Platelets 08/01/2020 147* 150 - 400 K/uL Final  . nRBC 08/01/2020 0.0  0.0 - 0.2 % Final   Performed at [facility], [address], [place], [place] [address]  . Sodium 08/01/2020 142  135 - 145 mmol/L Final  . Potassium 08/01/2020 4.5  3.5 - 5.1 mmol/L Final  . Chloride 08/01/2020 106  98 - 111 mmol/L Final  . CO2 08/01/2020 27  22 - 32 mmol/L Final  . Glucose, Bld 08/01/2020 142* 70 - 99 mg/dL Final   Glucose reference range applies only to samples taken after fasting for at least 8 hours.  . BUN 08/01/2020 16  8 - 23 mg/dL Final  . Creatinine, Ser 08/01/2020 1.06  0.61 - 1.24 mg/dL Final  . Calcium 37/11/8887 9.7  8.9 - 10.3 mg/dL Final  . Total Protein 08/01/2020 6.7  6.5 - 8.1 g/dL Final  . Albumin 16/94/5038 3.9  3.5 - 5.0 g/dL Final  . AST 88/28/0034 23  15 - 41 U/L Final  . ALT 08/01/2020 18  0 - 44 U/L Final  . Alkaline Phosphatase  08/01/2020 63  38 - 126 U/L Final  . Total Bilirubin 08/01/2020 0.5  0.3 - 1.2 mg/dL Final  . GFR, Estimated 08/01/2020 >60  >60 mL/min Final   Comment: (NOTE) Calculated using the CKD-EPI Creatinine Equation (2021)   . Anion gap 08/01/2020 9  5 - 15 Final   Performed at [facility], [address], [place], [place] [address]  . Prothrombin Time 08/01/2020 12.6  11.4 - 15.2 seconds Final  . INR 08/01/2020 1.0  0.8 - 1.2 Final   Comment: (NOTE) INR  goal varies based on device and disease states. Performed at [facility], [place] [address], [place], [place] [address]   . ABO/RH(D) 08/01/2020 B POS   Final  . Antibody Screen 08/01/2020 NEG   Final  . Sample Expiration 08/01/2020 08/14/2020,2359   Final  . Extend sample reason 08/01/2020    Final                   Value:NO TRANSFUSIONS OR PREGNANCY IN THE PAST 3 MONTHS Performed at [facility], [place] [address], [place], [place] [address]   . MRSA, PCR 08/01/2020 NEGATIVE  NEGATIVE Final  . Staphylococcus aureus 08/01/2020 NEGATIVE  NEGATIVE Final   Comment: (NOTE) The Xpert SA Assay (FDA approved for NASAL specimens in patients 7 years of age and older), is one component of a comprehensive surveillance program. It is not intended to diagnose infection nor to guide or monitor treatment. Performed at [facility], [place] [address], [place], [place] [address]      X-Rays:No results found.  EKG: Orders placed or performed during the hospital encounter of 08/01/20  . EKG 12 lead per protocol  . EKG 12 lead per protocol     Hospital Course: [name] is a 74 y.o. who was admitted to [facility]. They were brought to the operating room on 08/11/2020 and underwent Procedure(s): TOTAL KNEE ARTHROPLASTY.  Patient tolerated the procedure well and was later transferred to the recovery room and then to the orthopaedic floor for postoperative care. They  were given PO and IV analgesics for pain control following their surgery. They were given 24 hours of postoperative antibiotics of  Anti-infectives (From admission, onward)   Start     Dose/Rate Route Frequency Ordered Stop   08/11/20 1930  ceFAZolin (ANCEF) IVPB 2g/100 mL premix        2 g 200 mL/hr over 30 Minutes Intravenous Every 6 hours 08/11/20 1636 08/12/20 0222   08/11/20 1000  ceFAZolin (ANCEF) IVPB 2g/100 mL premix        2 g 200 mL/hr over 30 Minutes Intravenous On call to O.R. 08/11/20 0955 08/11/20 1351     and started on DVT prophylaxis in the form of Xarelto.   PT and OT were ordered for total joint protocol. Discharge planning consulted to help with postop disposition and equipment needs.  Patient had a good night on the evening of surgery. They started to get up OOB with therapy on POD #0. Pt was seen during rounds and was ready to go home pending progress with therapy. He worked with therapy on POD #1 and was meeting his goals. Pt was discharged to home later that day in stable condition.  Diet: Regular diet Activity: WBAT Follow-up: in 2 weeks Disposition: Home with outpatient physical therapy at [facility] Discharged Condition: stable   Discharge Instructions    Call MD / Call 911   Complete by: As directed    If you experience chest pain or shortness of breath, CALL 911 and be transported to the hospital emergency room.  If you develope a fever above 101 F, pus (white drainage) or increased drainage or redness at the wound, or calf pain, call your surgeon's office.   Change dressing   Complete by: As directed    You may remove the bulky bandage (ACE wrap and gauze) two days after surgery. You will have an adhesive waterproof bandage underneath. Leave this in place until your first follow-up appointment.  Constipation Prevention   Complete by: As directed    Drink plenty of fluids.  Prune juice may be helpful.  You may use a stool softener, such as Colace (over the counter)  100 mg twice a day.  Use MiraLax (over the counter) for constipation as needed.   Diet - low sodium heart healthy   Complete by: As directed    Do not put a pillow under the knee. Place it under the heel.   Complete by: As directed    Driving restrictions   Complete by: As directed    No driving for two weeks   TED hose   Complete by: As directed    Use stockings (TED hose) for three weeks on both leg(s).  You may remove them at night for sleeping.   Weight bearing as tolerated   Complete by: As directed      Allergies as of 08/12/2020   No Known Allergies     Medication List    STOP taking these medications   COQ10 PO   FIBER ADULT GUMMIES PO   folic acid 1 MG tablet Commonly known as: FOLVITE   folic acid 400 MCG tablet Commonly known as: FOLVITE   ibuprofen 200 MG tablet Commonly known as: ADVIL   indomethacin 25 MG capsule Commonly known as: INDOCIN   multivitamin with minerals Tabs tablet   OVER THE COUNTER MEDICATION     TAKE these medications   acetaminophen 500 MG tablet Commonly known as: TYLENOL Take 1,500 mg by mouth every 8 (eight) hours as needed for moderate pain.   allopurinol 100 MG tablet Commonly known as: ZYLOPRIM TAKE 1 TABLET(100 MG) BY MOUTH TWICE DAILY What changed: See the new instructions.   aspirin EC 325 MG tablet Take 1 tablet (325 mg total) by mouth 2 (two) times daily for 20 days. Then take one 81 mg aspirin once a day for three weeks. Then discontinue aspirin.   carvedilol 25 MG tablet Commonly known as: COREG TAKE 1 TABLET(25 MG) BY MOUTH TWICE DAILY What changed: See the new instructions.   gabapentin 300 MG capsule Commonly known as: NEURONTIN Take 300 mg by mouth 3 (three) times daily.   levothyroxine 75 MCG tablet Commonly known as: SYNTHROID TAKE 1 TABLET(75 MCG) BY MOUTH DAILY What changed: See the new instructions.   methocarbamol 500 MG tablet Commonly known as: ROBAXIN Take 1 tablet (500 mg total) by  mouth every 6 (six) hours as needed for muscle spasms.   oxyCODONE 5 MG immediate release tablet Commonly known as: Oxy IR/ROXICODONE Take 1-2 tablets (5-10 mg total) by mouth every 6 (six) hours as needed for severe pain.   rosuvastatin 10 MG tablet Commonly known as: CRESTOR TAKE 1 TABLET(10 MG) BY MOUTH DAILY What changed: See the new instructions.   terbinafine 250 MG tablet Commonly known as: LAMISIL Take 250 mg by mouth daily.   traMADol 50 MG tablet Commonly known as: ULTRAM Take 1-2 tablets (50-100 mg total) by mouth every 6 (six) hours as needed for moderate pain.            Discharge Care Instructions  (From admission, onward)         Start     Ordered   08/12/20 0000  Weight bearing as tolerated        08/12/20 0739   08/12/20 0000  Change dressing       Comments: You may remove the bulky bandage (ACE wrap and gauze) two days after  surgery. You will have an adhesive waterproof bandage underneath. Leave this in place until your first follow-up appointment.   08/12/20 0739          Follow-up Information    Ollen Gross, MD. Schedule an appointment as soon as possible for a visit in 2 week(s).   Specialty: Orthopedic Surgery Contact information: [address] [place] [address] [place] [address] [phone]               Signed: Arther Abbott, PA-C Orthopedic Surgery 08/20/2020, 10:29 AM

## 2020-10-14 ENCOUNTER — Other Ambulatory Visit: Payer: Self-pay | Admitting: Family Medicine

## 2020-10-15 NOTE — Telephone Encounter (Signed)
Left patient a detailed message to give the office a call to schedule an appointment.

## 2020-10-30 ENCOUNTER — Other Ambulatory Visit: Payer: Self-pay

## 2020-10-31 ENCOUNTER — Ambulatory Visit (INDEPENDENT_AMBULATORY_CARE_PROVIDER_SITE_OTHER): Payer: Medicare Other | Admitting: Family Medicine

## 2020-10-31 ENCOUNTER — Encounter: Payer: Self-pay | Admitting: Family Medicine

## 2020-10-31 VITALS — BP 136/78 | HR 52 | Temp 97.7°F | Ht 71.0 in | Wt 240.0 lb

## 2020-10-31 DIAGNOSIS — M109 Gout, unspecified: Secondary | ICD-10-CM

## 2020-10-31 DIAGNOSIS — E785 Hyperlipidemia, unspecified: Secondary | ICD-10-CM

## 2020-10-31 DIAGNOSIS — K5900 Constipation, unspecified: Secondary | ICD-10-CM

## 2020-10-31 DIAGNOSIS — E039 Hypothyroidism, unspecified: Secondary | ICD-10-CM

## 2020-10-31 DIAGNOSIS — R739 Hyperglycemia, unspecified: Secondary | ICD-10-CM

## 2020-10-31 DIAGNOSIS — I10 Essential (primary) hypertension: Secondary | ICD-10-CM | POA: Diagnosis not present

## 2020-10-31 DIAGNOSIS — Z8546 Personal history of malignant neoplasm of prostate: Secondary | ICD-10-CM

## 2020-10-31 MED ORDER — ALLOPURINOL 100 MG PO TABS: ORAL_TABLET | ORAL | 2 refills | Status: DC

## 2020-10-31 NOTE — Progress Notes (Signed)
[facility] [address] [place] [address] Dept: [phone] Dept Fax: [phone]  Chronic Care Office Visit  Subjective:    Patient ID: [name], male    DOB: 07-13-1946, 75 y.o..   MRN: [id number]  Chief Complaint  Patient presents with  . Annual Exam    CPE/labs.  C/o having irregular bowel movements off/on x 2 months. He has been taking probiotic daily and stool softeners as needed.     History of Present Illness:  Patient is in today for reassessment of chronic medical issues. Mr. Streety has a history of hypertension, managed with carvedilol, hypothyroidism, managed with Synthroid, and hyperlipidemia, managed with Crestor. He denies any problems with these medications. Mr. Ervine had a right total knee joint replacement in Dec. He is recovering well and notes his therapy is ahead of schedule.  Mr. Casasola has a history of gout. He notes he has not had an attack in many years. He continues on allopurinol.  Mr. Rezendes has a past history of prostate cancer. He had an initial prostatectomy. When he was found to have increasing PSAs, he then underwent radiation therapy. Some years later, he developed an issue with gross hematuria and clots. 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 This is resolved now. He does follow with the urologist annually to monitor for this and a prior history of a bladder polyp.  Mr. Gilkeson notes over the past few months he has had an issue with bowel irregularity. He often feels like he has not completely evacuated his bowels and notes a pasty quality to his stool He denies any bleeding, dark stools, increased gas, N/V, or weight changes.  Past Medical History: Patient Active Problem List   Diagnosis Date Noted  . OA (osteoarthritis) of knee 08/11/2020  . Primary osteoarthritis of right knee 08/11/2020  .  Essential hypertension 01/11/2020  . Onychomycosis 10/12/2019  . Hypothyroidism 10/12/2019  . Hyperlipidemia 10/12/2019  . Thrombocytopenia ([place]) 05/03/2019  . Gout 05/03/2019  . Screen for colon cancer 05/03/2019  . Personal history of prostate cancer 05/03/2019   Past Surgical History:  Procedure Laterality Date  . EYE SURGERY Bilateral 2014  . HERNIA REPAIR Right 1984   inguinal  . KNEE ARTHROSCOPY Left 1966  . PENILE PROSTHESIS IMPLANT  2014  . TOTAL KNEE ARTHROPLASTY Right 08/11/2020   Procedure: TOTAL KNEE ARTHROPLASTY;  Surgeon: Gaynelle Arabian, MD;  Location: [facility] ORS;  Service: Orthopedics;  Laterality: Right;  95min  . TRANSURETHRAL RESECTION OF BLADDER TUMOR  2018   No family history on file.  Outpatient Medications Prior to Visit  Medication Sig Dispense Refill  . acetaminophen (TYLENOL) 500 MG tablet Take 1,500 mg by mouth every 8 (eight) hours as needed for moderate pain.    Marland Kitchen aspirin EC 81 MG tablet Take 81 mg by mouth daily. Swallow whole.    . carvedilol (COREG) 25 MG tablet TAKE 1 TABLET(25 MG) BY MOUTH TWICE DAILY (Patient taking differently: Take 25 mg by mouth in the morning and at bedtime.) 180 tablet 2  . co-enzyme Q-10 30 MG capsule Take 30 mg by mouth 3 (three) times daily.    . folic acid (FOLVITE) 1 MG tablet Take 1 mg by mouth daily.    Marland Kitchen levothyroxine (SYNTHROID) 75 MCG tablet TAKE 1 TABLET(75 MCG) BY MOUTH DAILY 90 tablet 2  . OVER THE COUNTER MEDICATION Neuronol Advance Cognitive Formula    .  rosuvastatin (CRESTOR) 10 MG tablet TAKE 1 TABLET(10 MG) BY MOUTH DAILY (Patient taking differently: Take 10 mg by mouth daily.) 90 tablet 3  . terbinafine (LAMISIL) 250 MG tablet Take 250 mg by mouth daily.    Marland Kitchen allopurinol (ZYLOPRIM) 100 MG tablet TAKE 1 TABLET(100 MG) BY MOUTH TWICE DAILY (Patient taking differently: Take 100 mg by mouth 2 (two) times daily.) 180 tablet 2  . gabapentin (NEURONTIN) 300 MG capsule Take 300 mg by mouth 3 (three) times daily. (Patient  not taking: Reported on 10/31/2020)    . methocarbamol (ROBAXIN) 500 MG tablet Take 1 tablet (500 mg total) by mouth every 6 (six) hours as needed for muscle spasms. (Patient not taking: Reported on 10/31/2020) 40 tablet 0  . oxyCODONE (OXY IR/ROXICODONE) 5 MG immediate release tablet Take 1-2 tablets (5-10 mg total) by mouth every 6 (six) hours as needed for severe pain. (Patient not taking: Reported on 10/31/2020) 42 tablet 0  . traMADol (ULTRAM) 50 MG tablet Take 1-2 tablets (50-100 mg total) by mouth every 6 (six) hours as needed for moderate pain. (Patient not taking: Reported on 10/31/2020) 40 tablet 0   No facility-administered medications prior to visit.   No Known Allergies    Objective:   Today's Vitals   10/31/20 1516  BP: 136/78  Pulse: (!) 52  Temp: 97.7 F (36.5 C)  TempSrc: Temporal  SpO2: 98%  Weight: 240 lb (108.9 kg)  Height: 5\' 11"  (1.803 m)   Body mass index is 33.47 kg/m.   General: Well developed, well nourished. No acute distress. Abdomen: Soft, non-tender. Bowel sounds positive, normal pitch and frequency. No hepatosplenomegaly. No rebound or guarding. Rectal: Normal sphincter tone. No masses. Hemoccult neg. Psych: Alert and oriented. Normal mood and affect.  Health Maintenance Due  Topic Date Due  . Hepatitis C Screening  Never done  . TETANUS/TDAP  Never done  . PNA vac Low Risk Adult (1 of 2 - PCV13) Never done     Assessment & Plan:   1. Essential hypertension Blood pressure at goal. We will continue carvedilol.  2. Hypothyroidism, unspecified type We will reassess TSH to evaluate adequacy of current Synthroid dosing.  - TSH; Future  3. Gout, unspecified cause, unspecified chronicity, unspecified site We will check a uric acid level to make sure we are keeping this below 6 with allopurinol treatment.  - Uric acid; Future - allopurinol (ZYLOPRIM) 100 MG tablet; TAKE 1 TABLET(100 MG) BY MOUTH TWICE DAILY  Dispense: 180 tablet; Refill: 2  4.  Hyperlipidemia, unspecified hyperlipidemia type We will check fasting lipids to assess if we are at goal with current Crestor dosing.  - Lipid panel; Future  5. Personal history of prostate cancer Will continue to monitor PSA annually.  - PSA; Future  6. Hyperglycemia During the patients hospitalization in Dec., he had some elevated glucoses. I will check a HbA1c to assess.  - Hemoglobin A1c; Future  7. Constipation, unspecified constipation type Mr. Barberi' symptoms seem to be related to needing more fiber to increase moisture content in his stool I recommended he maintain adequate hydration. I also recommended he restart using a daily fiber laxative. We will follow up if this does not improve his symptoms.  Haydee Salter, MD

## 2020-10-31 NOTE — Patient Instructions (Signed)
Recommend daily OTC fiber laxative. Hydrate well.

## 2020-11-04 ENCOUNTER — Other Ambulatory Visit: Payer: Medicare Other

## 2020-11-07 ENCOUNTER — Other Ambulatory Visit: Payer: Self-pay

## 2020-11-18 ENCOUNTER — Other Ambulatory Visit (INDEPENDENT_AMBULATORY_CARE_PROVIDER_SITE_OTHER): Payer: Medicare Other

## 2020-11-18 ENCOUNTER — Other Ambulatory Visit: Payer: Self-pay

## 2020-11-18 ENCOUNTER — Other Ambulatory Visit: Payer: Self-pay | Admitting: Family Medicine

## 2020-11-18 ENCOUNTER — Encounter: Payer: Self-pay | Admitting: Family Medicine

## 2020-11-18 DIAGNOSIS — E039 Hypothyroidism, unspecified: Secondary | ICD-10-CM | POA: Diagnosis not present

## 2020-11-18 DIAGNOSIS — M109 Gout, unspecified: Secondary | ICD-10-CM | POA: Diagnosis not present

## 2020-11-18 DIAGNOSIS — Z8546 Personal history of malignant neoplasm of prostate: Secondary | ICD-10-CM | POA: Diagnosis not present

## 2020-11-18 DIAGNOSIS — E785 Hyperlipidemia, unspecified: Secondary | ICD-10-CM | POA: Diagnosis not present

## 2020-11-18 DIAGNOSIS — R7303 Prediabetes: Secondary | ICD-10-CM | POA: Insufficient documentation

## 2020-11-18 DIAGNOSIS — R739 Hyperglycemia, unspecified: Secondary | ICD-10-CM

## 2020-11-18 LAB — LIPID PANEL
Cholesterol: 156 mg/dL (ref 0–200)
HDL: 46.7 mg/dL (ref >39.00)
LDL Cholesterol: 74 mg/dL (ref 0–99)
NonHDL: 109.24
Total CHOL/HDL Ratio: 3
Triglycerides: 177 mg/dL — ABNORMAL HIGH (ref 0.0–149.0)
VLDL: 35.4 mg/dL (ref 0.0–40.0)

## 2020-11-18 LAB — URIC ACID: Uric Acid, Serum: 6.4 mg/dL (ref 4.0–7.8)

## 2020-11-18 LAB — HEMOGLOBIN A1C: Hgb A1c MFr Bld: 5.7 % (ref 4.6–6.5)

## 2020-11-18 LAB — PSA: PSA: 0 ng/mL — ABNORMAL LOW (ref 0.10–4.00)

## 2020-11-18 LAB — TSH: TSH: 5.99 u[IU]/mL — ABNORMAL HIGH (ref 0.35–4.50)

## 2020-11-18 MED ORDER — LEVOTHYROXINE SODIUM 88 MCG PO TABS: 88.0000 ug | ORAL_TABLET | Freq: Every day | ORAL | 3 refills | Status: DC

## 2020-11-18 NOTE — Progress Notes (Signed)
Per orders of Dr. Gena Fray pt is here to have labs drawn, pt tolerated draw well

## 2020-11-18 NOTE — Addendum Note (Signed)
Addended by: Haydee Salter on: 11/18/2020 05:53 PM   Modules accepted: Orders

## 2020-12-11 ENCOUNTER — Encounter: Payer: Medicare Other | Admitting: Family Medicine

## 2021-01-05 ENCOUNTER — Telehealth (INDEPENDENT_AMBULATORY_CARE_PROVIDER_SITE_OTHER): Payer: Medicare Other | Admitting: Family Medicine

## 2021-01-05 ENCOUNTER — Encounter: Payer: Medicare Other | Admitting: Family Medicine

## 2021-01-05 ENCOUNTER — Encounter: Payer: Self-pay | Admitting: Family Medicine

## 2021-01-05 DIAGNOSIS — B349 Viral infection, unspecified: Secondary | ICD-10-CM

## 2021-01-05 NOTE — Progress Notes (Deleted)
Established Patient Office Visit  Subjective:  Patient ID: [name], male    DOB: Jul 21, 1946  Age: 75 y.o. MRN: [id number]  CC:  Chief Complaint  Patient presents with  . Fatigue    Very tired, body aches, little cough, low grade fever symptoms x 5 days. No appetite    HPI Emmaus Brandi presents for ***  Past Medical History:  Diagnosis Date  . Arthritis    knees. back  . Cancer ([place]) 2018  . High cholesterol   . Hypertension   . Hypothyroidism   . Thyroid disease     Past Surgical History:  Procedure Laterality Date  . EYE SURGERY Bilateral 2014  . HERNIA REPAIR Right 1984   inguinal  . KNEE ARTHROSCOPY Left 1966  . PENILE PROSTHESIS IMPLANT  2014  . TOTAL KNEE ARTHROPLASTY Right 08/11/2020   Procedure: TOTAL KNEE ARTHROPLASTY;  Surgeon: Gaynelle Arabian, MD;  Location: [facility];  Service: Orthopedics;  Laterality: Right;  74min  . TRANSURETHRAL RESECTION OF BLADDER TUMOR  2018    No family history on file.  Social History   Socioeconomic History  . Marital status: Married    Spouse name: Not on file  . Number of children: Not on file  . Years of education: Not on file  . Highest education level: Not on file  Occupational History  . Not on file  Tobacco Use  . Smoking status: Never Smoker  . Smokeless tobacco: Never Used  Vaping Use  . Vaping Use: Some days  . Start date: 08/01/2017  . Substances: Mixture of cannabinoids  Substance and Sexual Activity  . Alcohol use: Yes    Alcohol/week: 2.0 standard drinks    Types: 2 Standard drinks or equivalent per week  . Drug use: Yes    Types: Marijuana  . Sexual activity: Yes  Other Topics Concern  . Not on file  Social History Narrative  . Not on file   Social Determinants of Health   Financial Resource Strain: Not on file  Food Insecurity: Not on file  Transportation Needs: Not on file  Physical Activity: Not on file  Stress: Not on file  Social Connections: Not on file  Intimate Partner Violence:  Not on file    Outpatient Medications Prior to Visit  Medication Sig Dispense Refill  . acetaminophen (TYLENOL) 500 MG tablet Take 1,500 mg by mouth every 8 (eight) hours as needed for moderate pain.    Marland Kitchen allopurinol (ZYLOPRIM) 100 MG tablet TAKE 1 TABLET(100 MG) BY MOUTH TWICE DAILY 180 tablet 2  . carvedilol (COREG) 25 MG tablet TAKE 1 TABLET(25 MG) BY MOUTH TWICE DAILY 180 tablet 2  . co-enzyme Q-10 30 MG capsule Take 30 mg by mouth 3 (three) times daily.    . folic acid (FOLVITE) 1 MG tablet Take 1 mg by mouth daily.    Marland Kitchen levothyroxine (SYNTHROID) 88 MCG tablet Take 1 tablet (88 mcg total) by mouth daily before breakfast. 90 tablet 3  . OVER THE COUNTER MEDICATION Neuronol Advance Cognitive Formula    . rosuvastatin (CRESTOR) 10 MG tablet TAKE 1 TABLET(10 MG) BY MOUTH DAILY (Patient taking differently: Take 10 mg by mouth daily.) 90 tablet 3  . terbinafine (LAMISIL) 250 MG tablet Take 250 mg by mouth daily.    Marland Kitchen aspirin EC 81 MG tablet Take 81 mg by mouth daily. Swallow whole. (Patient not taking: Reported on 01/05/2021)     No facility-administered medications prior to visit.    No  Known Allergies  ROS Review of Systems    Objective:    Physical Exam  Ht 5\' 11"  (1.803 m)   Wt 240 lb (108.9 kg)   BMI 33.47 kg/m  Wt Readings from Last 3 Encounters:  01/05/21 240 lb (108.9 kg)  10/31/20 240 lb (108.9 kg)  08/11/20 235 lb (106.6 kg)     Health Maintenance Due  Topic Date Due  . Hepatitis C Screening  Never done  . TETANUS/TDAP  Never done  . PNA vac Low Risk Adult (1 of 2 - PCV13) Never done    There are no preventive care reminders to display for this patient.  Lab Results  Component Value Date   TSH 5.99 (H) 11/18/2020   Lab Results  Component Value Date   WBC 7.8 08/12/2020   HGB 12.7 (L) 08/12/2020   HCT 38.8 (L) 08/12/2020   MCV 93.0 08/12/2020   PLT 142 (L) 08/12/2020   Lab Results  Component Value Date   NA 138 08/12/2020   K 4.3 08/12/2020   CO2  21 (L) 08/12/2020   GLUCOSE 155 (H) 08/12/2020   BUN 18 08/12/2020   CREATININE 0.97 08/12/2020   BILITOT 0.5 08/01/2020   ALKPHOS 63 08/01/2020   AST 23 08/01/2020   ALT 18 08/01/2020   PROT 6.7 08/01/2020   ALBUMIN 3.9 08/01/2020   CALCIUM 9.2 08/12/2020   ANIONGAP 9 08/12/2020   Lab Results  Component Value Date   CHOL 156 11/18/2020   Lab Results  Component Value Date   HDL 46.70 11/18/2020   Lab Results  Component Value Date   LDLCALC 74 11/18/2020   Lab Results  Component Value Date   TRIG 177.0 (H) 11/18/2020   Lab Results  Component Value Date   CHOLHDL 3 11/18/2020   Lab Results  Component Value Date   HGBA1C 5.7 11/18/2020      Assessment & Plan:   Problem List Items Addressed This Visit   None     No orders of the defined types were placed in this encounter.   Follow-up: No follow-ups on file.    Libby Maw, MD

## 2021-01-05 NOTE — Progress Notes (Signed)
Established Patient Office Visit  Subjective:  Patient ID: [name], male    DOB: [birth year]  Age: 75 y.o. MRN: [id number]  CC: No chief complaint on file.   HPI [name] presents for evaluation of a 5 day history of malaise, fatigue, subjective fever, and shortness of breath. Denies wheezing but has noticed shortness of breath when rolling over in bed. Denies chest pain. Denies n/v but has no appetite. Reports myalgias and arthralgias. Denies sore throat, cough or change in taste or smell. One episode of watery diarrhea but now stools are formed. Bladder cancer is currently stable. No recent antibiotic treatment.   Past Medical History:  Diagnosis Date  . Arthritis    knees. back  . Cancer ([place]) 2018  . High cholesterol   . Hypertension   . Hypothyroidism   . Thyroid disease     Past Surgical History:  Procedure Laterality Date  . EYE SURGERY Bilateral 2014  . HERNIA REPAIR Right 1984   inguinal  . KNEE ARTHROSCOPY Left 1966  . PENILE PROSTHESIS IMPLANT  2014  . TOTAL KNEE ARTHROPLASTY Right 08/11/2020   Procedure: TOTAL KNEE ARTHROPLASTY;  Surgeon: Gaynelle Arabian, MD;  Location: [facility];  Service: Orthopedics;  Laterality: Right;  36min  . TRANSURETHRAL RESECTION OF BLADDER TUMOR  2018    History reviewed. No pertinent family history.  Social History   Socioeconomic History  . Marital status: Married    Spouse name: Not on file  . Number of children: Not on file  . Years of education: Not on file  . Highest education level: Not on file  Occupational History  . Not on file  Tobacco Use  . Smoking status: Never Smoker  . Smokeless tobacco: Never Used  Vaping Use  . Vaping Use: Some days  . Start date: 08/01/2017  . Substances: Mixture of cannabinoids  Substance and Sexual Activity  . Alcohol use: Yes    Alcohol/week: 2.0 standard drinks    Types: 2 Standard drinks or equivalent per week  . Drug use: Yes    Types: Marijuana  . Sexual activity: Yes   Other Topics Concern  . Not on file  Social History Narrative  . Not on file   Social Determinants of Health   Financial Resource Strain: Not on file  Food Insecurity: Not on file  Transportation Needs: Not on file  Physical Activity: Not on file  Stress: Not on file  Social Connections: Not on file  Intimate Partner Violence: Not on file    Outpatient Medications Prior to Visit  Medication Sig Dispense Refill  . acetaminophen (TYLENOL) 500 MG tablet Take 1,500 mg by mouth every 8 (eight) hours as needed for moderate pain.    Marland Kitchen allopurinol (ZYLOPRIM) 100 MG tablet TAKE 1 TABLET(100 MG) BY MOUTH TWICE DAILY 180 tablet 2  . carvedilol (COREG) 25 MG tablet TAKE 1 TABLET(25 MG) BY MOUTH TWICE DAILY 180 tablet 2  . co-enzyme Q-10 30 MG capsule Take 30 mg by mouth daily.    . folic acid (FOLVITE) 1 MG tablet Take 1 mg by mouth daily.    Marland Kitchen levothyroxine (SYNTHROID) 88 MCG tablet Take 1 tablet (88 mcg total) by mouth daily before breakfast. 90 tablet 3  . OVER THE COUNTER MEDICATION Take 1 capsule by mouth daily. Neuronol Advance Cognitive Formula    . rosuvastatin (CRESTOR) 10 MG tablet TAKE 1 TABLET(10 MG) BY MOUTH DAILY (Patient taking differently: Take 10 mg by mouth daily.) 90 tablet 3  .  terbinafine (LAMISIL) 250 MG tablet Take 250 mg by mouth daily.     No facility-administered medications prior to visit.    No Known Allergies  ROS Review of Systems  Constitutional: Positive for appetite change, fatigue and fever. Negative for chills, diaphoresis and unexpected weight change.  HENT: Negative for congestion, postnasal drip and sore throat.   Eyes: Negative for photophobia and visual disturbance.  Respiratory: Positive for shortness of breath. Negative for cough, chest tightness and wheezing.   Cardiovascular: Negative for chest pain and palpitations.  Gastrointestinal: Negative for abdominal pain, nausea and vomiting.  Genitourinary: Negative for difficulty urinating, dysuria  and frequency.  Musculoskeletal: Positive for arthralgias and myalgias.  Skin: Negative for rash.  Neurological: Positive for weakness. Negative for speech difficulty.  Psychiatric/Behavioral: Negative.       Objective:    Physical Exam Constitutional:      General: He is not in acute distress. Pulmonary:     Effort: Pulmonary effort is normal.  Neurological:     Mental Status: He is alert and oriented to person, place, and time.  Psychiatric:        Mood and Affect: Mood normal.        Behavior: Behavior normal.     There were no vitals taken for this visit. Wt Readings from Last 3 Encounters:  01/05/21 240 lb (108.9 kg)  10/31/20 240 lb (108.9 kg)  08/11/20 235 lb (106.6 kg)     Health Maintenance Due  Topic Date Due  . Hepatitis C Screening  Never done  . TETANUS/TDAP  Never done  . PNA vac Low Risk Adult (1 of 2 - PCV13) Never done  . COVID-19 Vaccine (4 - Booster for Pfizer series) 12/04/2020    There are no preventive care reminders to display for this patient.  Lab Results  Component Value Date   TSH 5.99 (H) 11/18/2020   Lab Results  Component Value Date   WBC 9.4 01/06/2021   HGB 13.6 01/06/2021   HCT 42.3 01/06/2021   MCV 87.2 01/06/2021   PLT 300 01/06/2021   Lab Results  Component Value Date   NA 134 (L) 01/06/2021   K 4.9 01/06/2021   CO2 21 (L) 01/06/2021   GLUCOSE 123 (H) 01/06/2021   BUN 26 (H) 01/06/2021   CREATININE 1.23 01/06/2021   BILITOT 0.9 01/06/2021   ALKPHOS 122 01/06/2021   AST 25 01/06/2021   ALT 20 01/06/2021   PROT 8.1 01/06/2021   ALBUMIN 3.3 (L) 01/06/2021   CALCIUM 10.8 (H) 01/06/2021   ANIONGAP 13 01/06/2021   Lab Results  Component Value Date   CHOL 156 11/18/2020   Lab Results  Component Value Date   HDL 46.70 11/18/2020   Lab Results  Component Value Date   LDLCALC 74 11/18/2020   Lab Results  Component Value Date   TRIG 177.0 (H) 11/18/2020   Lab Results  Component Value Date   CHOLHDL 3  11/18/2020   Lab Results  Component Value Date   HGBA1C 5.7 11/18/2020      Assessment & Plan:   Problem List Items Addressed This Visit   None   Visit Diagnoses    Viral syndrome    -  Primary      No orders of the defined types were placed in this encounter.   Follow-up: Return if symptoms worsen or fail to improve.  Patient advised to go to Urgent Care for evaluation.   Libby Maw, MD  Virtual Visit via Telephone Note  I connected with [name] on 01/08/21 at  1:30 PM EDT by telephone and verified that I am speaking with the correct person using two identifiers.  Location: Patient: home Provider: home   I discussed the limitations, risks, security and privacy concerns of performing an evaluation and management service by telephone and the availability of in person appointments. I also discussed with the patient that there may be a patient responsible charge related to this service. The patient expressed understanding and agreed to proceed.   History of Present Illness:    Observations/Objective:   Assessment and Plan:   Follow Up Instructions:    I discussed the assessment and treatment plan with the patient. The patient was provided an opportunity to ask questions and all were answered. The patient agreed with the plan and demonstrated an understanding of the instructions.   The patient was advised to call back or seek an in-person evaluation if the symptoms worsen or if the condition fails to improve as anticipated.  I provided 20 minutes of non-face-to-face time during this encounter.   Libby Maw, MD   Interactive video and audio telecommunications were attempted between myself and the patient. However they failed due to the patient having technical difficulties or not having access to video capability. We continued and completed with audio only.

## 2021-01-06 ENCOUNTER — Emergency Department (HOSPITAL_COMMUNITY)
Admission: EM | Admit: 2021-01-06 | Discharge: 2021-01-06 | Disposition: A | Discharge status: Home / Self Care | Payer: Medicare Other | Attending: Emergency Medicine | Admitting: Emergency Medicine

## 2021-01-06 ENCOUNTER — Other Ambulatory Visit: Payer: Self-pay

## 2021-01-06 ENCOUNTER — Emergency Department (HOSPITAL_COMMUNITY): Payer: Medicare Other

## 2021-01-06 ENCOUNTER — Encounter (HOSPITAL_COMMUNITY): Payer: Self-pay | Admitting: Emergency Medicine

## 2021-01-06 DIAGNOSIS — M791 Myalgia, unspecified site: Secondary | ICD-10-CM | POA: Diagnosis not present

## 2021-01-06 DIAGNOSIS — E039 Hypothyroidism, unspecified: Secondary | ICD-10-CM | POA: Insufficient documentation

## 2021-01-06 DIAGNOSIS — Z20822 Contact with and (suspected) exposure to covid-19: Secondary | ICD-10-CM | POA: Insufficient documentation

## 2021-01-06 DIAGNOSIS — R5383 Other fatigue: Secondary | ICD-10-CM | POA: Diagnosis present

## 2021-01-06 DIAGNOSIS — E860 Dehydration: Secondary | ICD-10-CM | POA: Insufficient documentation

## 2021-01-06 DIAGNOSIS — Z859 Personal history of malignant neoplasm, unspecified: Secondary | ICD-10-CM | POA: Insufficient documentation

## 2021-01-06 DIAGNOSIS — R197 Diarrhea, unspecified: Secondary | ICD-10-CM | POA: Insufficient documentation

## 2021-01-06 DIAGNOSIS — I10 Essential (primary) hypertension: Secondary | ICD-10-CM | POA: Insufficient documentation

## 2021-01-06 DIAGNOSIS — Z96652 Presence of left artificial knee joint: Secondary | ICD-10-CM | POA: Diagnosis not present

## 2021-01-06 DIAGNOSIS — Z96651 Presence of right artificial knee joint: Secondary | ICD-10-CM | POA: Diagnosis not present

## 2021-01-06 DIAGNOSIS — Z79899 Other long term (current) drug therapy: Secondary | ICD-10-CM | POA: Insufficient documentation

## 2021-01-06 LAB — URINALYSIS, ROUTINE W REFLEX MICROSCOPIC
Bilirubin Urine: NEGATIVE
Glucose, UA: NEGATIVE mg/dL
Ketones, ur: 20 mg/dL — AB
Leukocytes,Ua: NEGATIVE
Nitrite: NEGATIVE
Protein, ur: 30 mg/dL — AB
Specific Gravity, Urine: 1.018 (ref 1.005–1.030)
pH: 5 (ref 5.0–8.0)

## 2021-01-06 LAB — CBC WITH DIFFERENTIAL/PLATELET
Abs Immature Granulocytes: 0.08 10*3/uL — ABNORMAL HIGH (ref 0.00–0.07)
Basophils Absolute: 0 10*3/uL (ref 0.0–0.1)
Basophils Relative: 0 %
Eosinophils Absolute: 0 10*3/uL (ref 0.0–0.5)
Eosinophils Relative: 0 %
HCT: 42.3 % (ref 39.0–52.0)
Hemoglobin: 13.6 g/dL (ref 13.0–17.0)
Immature Granulocytes: 1 %
Lymphocytes Relative: 5 %
Lymphs Abs: 0.5 10*3/uL — ABNORMAL LOW (ref 0.7–4.0)
MCH: 28 pg (ref 26.0–34.0)
MCHC: 32.2 g/dL (ref 30.0–36.0)
MCV: 87.2 fL (ref 80.0–100.0)
Monocytes Absolute: 0.6 10*3/uL (ref 0.1–1.0)
Monocytes Relative: 6 %
Neutro Abs: 8.2 10*3/uL — ABNORMAL HIGH (ref 1.7–7.7)
Neutrophils Relative %: 88 %
Platelets: 300 10*3/uL (ref 150–400)
RBC: 4.85 MIL/uL (ref 4.22–5.81)
RDW: 14.9 % (ref 11.5–15.5)
WBC: 9.4 10*3/uL (ref 4.0–10.5)
nRBC: 0 % (ref 0.0–0.2)

## 2021-01-06 LAB — CK: Total CK: 78 U/L (ref 49–397)

## 2021-01-06 LAB — COMPREHENSIVE METABOLIC PANEL
ALT: 20 U/L (ref 0–44)
AST: 25 U/L (ref 15–41)
Albumin: 3.3 g/dL — ABNORMAL LOW (ref 3.5–5.0)
Alkaline Phosphatase: 122 U/L (ref 38–126)
Anion gap: 13 (ref 5–15)
BUN: 26 mg/dL — ABNORMAL HIGH (ref 8–23)
CO2: 21 mmol/L — ABNORMAL LOW (ref 22–32)
Calcium: 10.8 mg/dL — ABNORMAL HIGH (ref 8.9–10.3)
Chloride: 100 mmol/L (ref 98–111)
Creatinine, Ser: 1.23 mg/dL (ref 0.61–1.24)
GFR, Estimated: >60 mL/min (ref >60)
Glucose, Bld: 123 mg/dL — ABNORMAL HIGH (ref 70–99)
Potassium: 4.9 mmol/L (ref 3.5–5.1)
Sodium: 134 mmol/L — ABNORMAL LOW (ref 135–145)
Total Bilirubin: 0.9 mg/dL (ref 0.3–1.2)
Total Protein: 8.1 g/dL (ref 6.5–8.1)

## 2021-01-06 LAB — TROPONIN I (HIGH SENSITIVITY)
Troponin I (High Sensitivity): 6 ng/L (ref <18)
Troponin I (High Sensitivity): 5 ng/L (ref <18)

## 2021-01-06 LAB — LIPASE, BLOOD: Lipase: 39 U/L (ref 11–51)

## 2021-01-06 LAB — RESP PANEL BY RT-PCR (FLU A&B, COVID) ARPGX2
Influenza A by PCR: NEGATIVE
Influenza B by PCR: NEGATIVE
SARS Coronavirus 2 by RT PCR: NEGATIVE

## 2021-01-06 LAB — LACTIC ACID, PLASMA
Lactic Acid, Venous: 2.7 mmol/L (ref 0.5–1.9)
Lactic Acid, Venous: 1.3 mmol/L (ref 0.5–1.9)

## 2021-01-06 MED ORDER — CEPHALEXIN 500 MG PO CAPS
500.0000 mg | ORAL_CAPSULE | Freq: Once | ORAL | Status: AC
  Administered 2021-01-06: 500 mg via ORAL
  Filled 2021-01-06: qty 1

## 2021-01-06 MED ORDER — ONDANSETRON HCL 4 MG PO TABS: 4.0000 mg | ORAL_TABLET | Freq: Four times a day (QID) | ORAL | 0 refills | Status: DC

## 2021-01-06 MED ORDER — CEPHALEXIN 500 MG PO CAPS: 500.0000 mg | ORAL_CAPSULE | Freq: Four times a day (QID) | ORAL | 0 refills | Status: DC

## 2021-01-06 MED ORDER — SODIUM CHLORIDE 0.9 % IV BOLUS
1000.0000 mL | Freq: Once | INTRAVENOUS | Status: AC
  Administered 2021-01-06: 1000 mL via INTRAVENOUS

## 2021-01-06 NOTE — ED Notes (Signed)
Pt stated he had to have a BM. Pt attempted to provide a urine sample while in the bathroom, but was unable to do so at this time.

## 2021-01-06 NOTE — ED Provider Notes (Signed)
[name]   CSN: [id number] Arrival date & time: 01/06/21  1353     History Chief Complaint  Patient presents with  . Fatigue  . Generalized Body Aches  . Anorexia  . Diarrhea    [name] is a 75 y.o. male.  75 year old male with prior medical history as detailed below presents for evaluation.  Patient reports 5 to 7 days of generalized malaise and fatigue.  Patient reports significant lethargy.  He denies fever.  He denies significant shortness of breath.  He denies abdominal pain or chest pain.  Symptoms were gradual in onset.  Patient reports decreased p.o. intake over the last day or 2.  He denies nausea or vomiting.  He denies diarrhea.  The history is provided by the patient and medical records.  Illness Location:  Malaise, fatigue Severity:  Moderate Onset quality:  Sudden Duration:  5 days Timing:  Constant Progression:  Worsening Chronicity:  New      Past Medical History:  Diagnosis Date  . Arthritis    knees. back  . Cancer ([place]) 2018  . High cholesterol   . Hypertension   . Hypothyroidism   . Thyroid disease     Patient Active Problem List   Diagnosis Date Noted  . Prediabetes 11/18/2020  . OA (osteoarthritis) of knee 08/11/2020  . Primary osteoarthritis of right knee 08/11/2020  . Essential hypertension 01/11/2020  . Onychomycosis 10/12/2019  . Hypothyroidism 10/12/2019  . Hyperlipidemia 10/12/2019  . Thrombocytopenia ([place]) 05/03/2019  . Gout 05/03/2019  . Screen for colon cancer 05/03/2019  . Personal history of prostate cancer 05/03/2019    Past Surgical History:  Procedure Laterality Date  . EYE SURGERY Bilateral 2014  . HERNIA REPAIR Right 1984   inguinal  . KNEE ARTHROSCOPY Left 1966  . PENILE PROSTHESIS IMPLANT  2014  . TOTAL KNEE ARTHROPLASTY Right 08/11/2020   Procedure: TOTAL KNEE ARTHROPLASTY;  Surgeon: Gaynelle Arabian, MD;  Location: [facility];  Service: Orthopedics;   Laterality: Right;  21min  . TRANSURETHRAL RESECTION OF BLADDER TUMOR  2018       No family history on file.  Social History   Tobacco Use  . Smoking status: Never Smoker  . Smokeless tobacco: Never Used  Vaping Use  . Vaping Use: Some days  . Start date: 08/01/2017  . Substances: Mixture of cannabinoids  Substance Use Topics  . Alcohol use: Yes    Alcohol/week: 2.0 standard drinks    Types: 2 Standard drinks or equivalent per week  . Drug use: Yes    Types: Marijuana    Home Medications Prior to Admission medications   Medication Sig Start Date End Date Taking? Authorizing Provider  acetaminophen (TYLENOL) 500 MG tablet Take 1,500 mg by mouth every 8 (eight) hours as needed for moderate pain.   Yes [provider]  allopurinol (ZYLOPRIM) 100 MG tablet TAKE 1 TABLET(100 MG) BY MOUTH TWICE DAILY 10/31/20  Yes Haydee Salter, MD  carvedilol (COREG) 25 MG tablet TAKE 1 TABLET(25 MG) BY MOUTH TWICE DAILY 07/16/20  Yes Dutch Quint B, FNP  cephALEXin (KEFLEX) 500 MG capsule Take 1 capsule (500 mg total) by mouth 4 (four) times daily. 01/06/21  Yes Valarie Merino, MD  co-enzyme Q-10 30 MG capsule Take 30 mg by mouth daily.   Yes [provider]  folic acid (FOLVITE) 1 MG tablet Take 1 mg by mouth daily.   Yes [provider]  levothyroxine (SYNTHROID) 88  MCG tablet Take 1 tablet (88 mcg total) by mouth daily before breakfast. 11/18/20  Yes Haydee Salter, MD  ondansetron (ZOFRAN) 4 MG tablet Take 1 tablet (4 mg total) by mouth every 6 (six) hours. 01/06/21  Yes Valarie Merino, MD  OVER THE COUNTER MEDICATION Take 1 capsule by mouth daily. Neuronol Advance Cognitive Formula   Yes [provider]  rosuvastatin (CRESTOR) 10 MG tablet TAKE 1 TABLET(10 MG) BY MOUTH DAILY Patient taking differently: Take 10 mg by mouth daily. 05/01/20  Yes Libby Maw, MD  terbinafine (LAMISIL) 250 MG tablet Take 250 mg by mouth daily.   Yes [provider]    Allergies    Patient has no known allergies.  Review of Systems   Review of Systems  All other systems reviewed and are negative.   Physical Exam Updated Vital Signs BP 128/76   Pulse 81   Temp 98.8 F (37.1 C) (Oral)   Resp 20   SpO2 93%   Physical Exam Vitals and nursing [name] reviewed.  Constitutional:      General: He is not in acute distress.    Appearance: Normal appearance. He is well-developed.  HENT:     Head: Normocephalic and atraumatic.     Nose: Nose normal.     Mouth/Throat:     Mouth: Mucous membranes are dry.  Eyes:     Conjunctiva/sclera: Conjunctivae normal.     Pupils: Pupils are equal, round, and reactive to light.  Cardiovascular:     Rate and Rhythm: Normal rate and regular rhythm.     Heart sounds: Normal heart sounds.  Pulmonary:     Effort: Pulmonary effort is normal. No respiratory distress.     Breath sounds: Normal breath sounds.  Abdominal:     General: There is no distension.     Palpations: Abdomen is soft.     Tenderness: There is no abdominal tenderness.  Musculoskeletal:        General: No deformity. Normal range of motion.     Cervical back: Normal range of motion and neck supple.  Skin:    General: Skin is warm and dry.  Neurological:     General: No focal deficit present.     Mental Status: He is alert and oriented to person, place, and time. Mental status is at baseline.     ED Results / Procedures / Treatments   Labs (all labs ordered are listed, but only abnormal results are displayed) Labs Reviewed  COMPREHENSIVE METABOLIC PANEL - Abnormal; Notable for the following components:      Result Value   Sodium 134 (*)    CO2 21 (*)    Glucose, Bld 123 (*)    BUN 26 (*)    Calcium 10.8 (*)    Albumin 3.3 (*)    All other components within normal limits  URINALYSIS, ROUTINE W REFLEX MICROSCOPIC - Abnormal; Notable for the following components:   Hgb urine dipstick SMALL (*)    Ketones, ur 20 (*)     Protein, ur 30 (*)    Bacteria, UA RARE (*)    All other components within normal limits  CBC WITH DIFFERENTIAL/PLATELET - Abnormal; Notable for the following components:   Neutro Abs 8.2 (*)    Lymphs Abs 0.5 (*)    Abs Immature Granulocytes 0.08 (*)    All other components within normal limits  LACTIC ACID, PLASMA - Abnormal; Notable for the following components:   Lactic Acid,  Venous 2.7 (*)    All other components within normal limits  RESP PANEL BY RT-PCR (FLU A&B, COVID) ARPGX2  CULTURE, BLOOD (ROUTINE X 2)  CULTURE, BLOOD (ROUTINE X 2)  URINE CULTURE  LIPASE, BLOOD  CK  LACTIC ACID, PLASMA  TROPONIN I (HIGH SENSITIVITY)  TROPONIN I (HIGH SENSITIVITY)    EKG EKG Interpretation  Date/Time:  Tuesday Jan 06 2021 15:48:21 EDT Ventricular Rate:  90 PR Interval:  178 QRS Duration: 95 QT Interval:  370 QTC Calculation: 427 R Axis:   71 Text Interpretation: Sinus rhythm Multiple ventricular premature complexes Minimal ST elevation, inferior leads Confirmed by Dene Gentry [phone]) on 01/06/2021 3:52:23 PM   Radiology DG Chest Port 1 View  Result Date: 01/06/2021 CLINICAL DATA:  Shortness of breath.  Fatigue. EXAM: PORTABLE CHEST 1 VIEW COMPARISON:  10/11/2019 FINDINGS: The cardiomediastinal contours are normal. The lungs are clear. Pulmonary vasculature is normal. No consolidation, pleural effusion, or pneumothorax. No acute osseous abnormalities are seen. Mild degenerative change of the right shoulder. IMPRESSION: No acute chest findings. Electronically Signed   By: Keith Rake M.D.   On: 01/06/2021 17:39    Procedures Procedures   Medications Ordered in ED Medications  cephALEXin (KEFLEX) capsule 500 mg (has no administration in time range)  sodium chloride 0.9 % bolus 1,000 mL (0 mLs Intravenous Stopped 01/06/21 1815)  sodium chloride 0.9 % bolus 1,000 mL (0 mLs Intravenous Stopped 01/06/21 2027)    ED Course  I have reviewed the triage vital signs and the  nursing notes.  Pertinent labs & imaging results that were available during my care of the patient were reviewed by me and considered in my medical decision making (see chart for details).    MDM Rules/Calculators/A&P                          MDM  MSE complete  [name] was evaluated in Emergency Department on 01/06/2021 for the symptoms described in the history of present illness. He was evaluated in the context of the global COVID-19 pandemic, which necessitated consideration that the patient might be at risk for infection with the SARS-CoV-2 virus that causes COVID-19. Institutional protocols and algorithms that pertain to the evaluation of patients at risk for COVID-19 are in a state of rapid change based on information released by regulatory bodies including the CDC and federal and state organizations. These policies and algorithms were followed during the patient's care in the ED.   Presented with complaint of malaise, fatigue, lethargy.  Symptoms are nonspecific.  Patient did admit to recently decreased p.o. intake.    Work-up suggests moderate dehydration.  No clear infectious process identified.  After IV fluids patient feels significantly improved.  He desires discharge home.  Out of abundance of caution, will start patient on keflex for treatment of possible UTI.   Importance of close follow-up is stressed.  Strict return precautions given and understood.   Final Clinical Impression(s) / ED Diagnoses Final diagnoses:  Dehydration  Fatigue, unspecified type    Rx / DC Orders ED Discharge Orders         Ordered    ondansetron (ZOFRAN) 4 MG tablet  Every 6 hours        01/06/21 2113    cephALEXin (KEFLEX) 500 MG capsule  4 times daily        01/06/21 2113           Valarie Merino, MD 01/06/21  2124  

## 2021-01-06 NOTE — ED Triage Notes (Signed)
Pt c/o fatigue, diarrhea, no appetite, body aches for week. Took home covid test but didn't do it correctly.

## 2021-01-06 NOTE — ED Provider Notes (Signed)
Emergency Medicine Provider Triage Evaluation Note  [name] , a 75 y.o. male  was evaluated in triage.  Pt complains of a week of generalized body aches, lethargy, decreased p.o. intake, mild cough.  Patient states symptoms are worsening over the past week.  He has hot and cold sensation, but no known fever.  No chest pain.  No nausea or vomiting.  He is fully vaccinated for COVID with a booster.  Vaccinated for flu.  No sick contacts.  He has no medical problems currently, takes no medications daily.  He does have a history of prostate and bladder cancer in remission.  Review of Systems  Positive: Body aches, fatigue, cough, chills Negative: N/v, cp  Physical Exam  BP (!) 92/50 (BP Location: Left Arm)   Pulse 74   Temp 98.4 F (36.9 C) (Oral)   Resp 19   SpO2 100%  Gen:   Awake, appears ill Resp:  Normal effort MSK:   Moves extremities without difficulty  Other:  No TTP of the abdomen.  Clear lung sounds.  Medical Decision Making  Medically screening exam initiated at 2:41 PM.  Appropriate orders placed.  [name] was informed that the remainder of the evaluation will be completed by another provider, this initial triage assessment does not replace that evaluation, and the importance of remaining in the ED until their evaluation is complete.  Patient appears ill.  Will order labs and COVID test.   Franchot Heidelberg, PA-C 01/06/21 1443    Charlesetta Shanks, MD 01/15/21 [phone]

## 2021-01-06 NOTE — Discharge Instructions (Signed)
Return for any problem.   Take keflex as prescribed. Use zofran as prescribed for nausea.

## 2021-01-08 ENCOUNTER — Encounter: Payer: Self-pay | Admitting: Family Medicine

## 2021-01-08 LAB — URINE CULTURE

## 2021-01-09 ENCOUNTER — Ambulatory Visit: Payer: Medicare Other | Admitting: Family Medicine

## 2021-01-11 LAB — CULTURE, BLOOD (ROUTINE X 2)
Culture: NO GROWTH
Culture: NO GROWTH

## 2021-01-16 ENCOUNTER — Other Ambulatory Visit: Payer: Self-pay

## 2021-01-16 ENCOUNTER — Ambulatory Visit (INDEPENDENT_AMBULATORY_CARE_PROVIDER_SITE_OTHER): Payer: Medicare Other | Admitting: Family Medicine

## 2021-01-16 ENCOUNTER — Encounter: Payer: Self-pay | Admitting: Family Medicine

## 2021-01-16 VITALS — BP 128/70 | HR 58 | Temp 98.3°F | Ht 71.0 in | Wt 222.0 lb

## 2021-01-16 DIAGNOSIS — R5383 Other fatigue: Secondary | ICD-10-CM

## 2021-01-16 DIAGNOSIS — E871 Hypo-osmolality and hyponatremia: Secondary | ICD-10-CM

## 2021-01-16 NOTE — Progress Notes (Signed)
[facility] [address] [place] [address] Dept: [phone] Dept Fax: [phone]  Office Visit  Subjective:    Patient ID: [name], male    DOB: 27-Nov-1945, 75 y.o..   MRN: [id number]  Chief Complaint  Patient presents with  . Acute Visit    C/o still feeling extremely fatigue after being seen in ER on 01/06/21.  Also having problems with constipation.      History of Present Illness:  Patient is in today for reassessment of fatigue. [name] became ill about 10 days ago. At that point, he was having subjective fever and chills with associated drenching sweats. He had a loss of appetite and generalized weakness. He was seen at [facility] for evaluation. He had extensive blood work, a UA, and EKG, and a CXR performed. Most of this was unrevealing. He was discharged on Keflex and provided Zofran for potential nausea. He was instructed to push fluids. The apparent thought was there might have been a UTI. 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 He does have urinary incontinence related to treatment for prostate cancer.  [name] has now completed his course of Keflex. He notes int he past 2 days, he feels some mild improvement in his energy level, but still very fatigued overall. He is no longer having the fever/sweats. He has been mildly constipated, but is only back to eating in the past 1-2 days. His wife has had him hydrating with Pedialyte and Gatorade.  Past Medical History: Patient Active Problem List   Diagnosis Date Noted  . Prediabetes 11/18/2020  . OA (osteoarthritis) of knee 08/11/2020  . Primary osteoarthritis of right knee 08/11/2020  . Essential hypertension 01/11/2020  . Onychomycosis 10/12/2019  . Hypothyroidism 10/12/2019  . Hyperlipidemia 10/12/2019  . Thrombocytopenia ([place]) 05/03/2019  . Gout 05/03/2019  . Screen  for colon cancer 05/03/2019  . Personal history of prostate cancer 05/03/2019   Past Surgical History:  Procedure Laterality Date  . EYE SURGERY Bilateral 2014  . HERNIA REPAIR Right 1984   inguinal  . KNEE ARTHROSCOPY Left 1966  . PENILE PROSTHESIS IMPLANT  2014  . TOTAL KNEE ARTHROPLASTY Right 08/11/2020   Procedure: TOTAL KNEE ARTHROPLASTY;  Surgeon: Gaynelle Arabian, MD;  Location: [facility];  Service: Orthopedics;  Laterality: Right;  29min  . TRANSURETHRAL RESECTION OF BLADDER TUMOR  2018   No family history on file.  Outpatient Medications Prior to Visit  Medication Sig Dispense Refill  . acetaminophen (TYLENOL) 500 MG tablet Take 1,500 mg by mouth every 8 (eight) hours as needed for moderate pain.    Marland Kitchen allopurinol (ZYLOPRIM) 100 MG tablet TAKE 1 TABLET(100 MG) BY MOUTH TWICE DAILY 180 tablet 2  . carvedilol (COREG) 25 MG tablet TAKE 1 TABLET(25 MG) BY MOUTH TWICE DAILY 180 tablet 2  . co-enzyme Q-10 30 MG capsule Take 30 mg by mouth daily.    . folic acid (FOLVITE) 1 MG tablet Take 1 mg by mouth daily.    Marland Kitchen levothyroxine (SYNTHROID) 88 MCG tablet Take 1 tablet (88 mcg total) by mouth daily before breakfast. 90 tablet 3  . rosuvastatin (CRESTOR) 10 MG tablet TAKE 1 TABLET(10 MG) BY MOUTH DAILY (Patient taking differently: Take 10 mg by mouth daily.) 90 tablet 3  . terbinafine (LAMISIL) 250 MG tablet Take 250 mg by mouth daily.    . cephALEXin (KEFLEX) 500 MG capsule Take 1 capsule (500 mg  total) by mouth 4 (four) times daily. (Patient not taking: Reported on 01/16/2021) 28 capsule 0  . ondansetron (ZOFRAN) 4 MG tablet Take 1 tablet (4 mg total) by mouth every 6 (six) hours. (Patient not taking: Reported on 01/16/2021) 12 tablet 0  . OVER THE COUNTER MEDICATION Take 1 capsule by mouth daily. Neuronol Advance Cognitive Formula (Patient not taking: Reported on 01/16/2021)     No facility-administered medications prior to visit.   No Known Allergies    Objective:   Today's Vitals    01/16/21 1535  BP: 128/70  Pulse: (!) 58  Temp: 98.3 F (36.8 C)  TempSrc: Temporal  SpO2: 99%  Weight: 222 lb (100.7 kg)  Height: 5\' 11"  (1.803 m)   Body mass index is 30.96 kg/m.   General: Well developed, well nourished. No acute distress. Lungs: Clear to auscultation bilaterally. No wheezing, rales or rhonchi. CV: RRR without murmurs or rubs. Pulses 2+ bilaterally. Abdomen: Soft, non-tender. Bowel sounds positive, normal pitch and frequency. No hepatosplenomegaly. No rebound or guarding. Back: Straight. No CVA tenderness bilaterally. Extremities: Trace edema noted. Psych: Alert and oriented. Normal mood and affect.  Health Maintenance Due  Topic Date Due  . Hepatitis C Screening  Never done  . TETANUS/TDAP  Never done  . PNA vac Low Risk Adult (1 of 2 - PCV13) Never done  . COVID-19 Vaccine (4 - Booster for Pfizer series) 12/04/2020     Lab Results: Urine dipstick shows negative for nitrites, leukocytes, red blood cells, glucose, ketones, positive for protein, urobilinogen.  Assessment & Plan:   1. Fatigue, unspecified type I reviewed the ER note, lab tests results, EKG, and CXR report. The blood cultures were negative after 5 days. The urine culture showed mixed flora, so was likely not a clean catch. The urine today does not suggest any ongoing UTI. I reassured [name] and his wife. I recommend he try and gradually advance his diet and his activity level. He should follow-up next week if he is not continuing to gradually improve.  - POCT Urinalysis Dipstick  2. Hyponatremia There was a minor decrease in his sodium level and a minor increase in his creatinine in the ER. I will check a BMP to reassess. Suspect this was due to mild dehydration.  - Basic metabolic panel    Haydee Salter, MD

## 2021-01-17 LAB — BASIC METABOLIC PANEL
BUN: 12 mg/dL (ref 7–25)
CO2: 25 mmol/L (ref 20–32)
Calcium: 9.9 mg/dL (ref 8.6–10.3)
Chloride: 104 mmol/L (ref 98–110)
Creat: 0.91 mg/dL (ref 0.70–1.18)
Glucose, Bld: 98 mg/dL (ref 65–99)
Potassium: 5.1 mmol/L (ref 3.5–5.3)
Sodium: 138 mmol/L (ref 135–146)

## 2021-01-20 LAB — POCT URINALYSIS DIPSTICK
Bilirubin, UA: NEGATIVE
Blood, UA: NEGATIVE
Glucose, UA: NEGATIVE
Ketones, UA: NEGATIVE
Leukocytes, UA: NEGATIVE
Nitrite, UA: NEGATIVE
Protein, UA: POSITIVE — AB
Spec Grav, UA: 1.02 (ref 1.010–1.025)
Urobilinogen, UA: 2 E.U./dL — AB
pH, UA: 6 (ref 5.0–8.0)

## 2021-02-05 ENCOUNTER — Ambulatory Visit: Payer: Medicare Other | Admitting: Family Medicine

## 2021-02-10 ENCOUNTER — Other Ambulatory Visit: Payer: Self-pay

## 2021-02-11 ENCOUNTER — Encounter: Payer: Self-pay | Admitting: Family Medicine

## 2021-02-11 ENCOUNTER — Ambulatory Visit (INDEPENDENT_AMBULATORY_CARE_PROVIDER_SITE_OTHER): Payer: Medicare Other | Admitting: Family Medicine

## 2021-02-11 VITALS — BP 128/76 | HR 68 | Temp 97.6°F | Ht 71.0 in | Wt 223.8 lb

## 2021-02-11 DIAGNOSIS — R0609 Other forms of dyspnea: Secondary | ICD-10-CM

## 2021-02-11 DIAGNOSIS — R5383 Other fatigue: Secondary | ICD-10-CM

## 2021-02-11 DIAGNOSIS — E039 Hypothyroidism, unspecified: Secondary | ICD-10-CM | POA: Diagnosis not present

## 2021-02-11 DIAGNOSIS — I10 Essential (primary) hypertension: Secondary | ICD-10-CM | POA: Diagnosis not present

## 2021-02-11 DIAGNOSIS — R0600 Dyspnea, unspecified: Secondary | ICD-10-CM | POA: Diagnosis not present

## 2021-02-11 LAB — TSH: TSH: 1.06 u[IU]/mL (ref 0.35–4.50)

## 2021-02-11 LAB — BRAIN NATRIURETIC PEPTIDE: Pro B Natriuretic peptide (BNP): 147 pg/mL — ABNORMAL HIGH (ref 0.0–100.0)

## 2021-02-11 NOTE — Progress Notes (Signed)
[facility] [address] [place] [address] Dept: [phone] Dept Fax: [phone]  Office Visit  Subjective:    Patient ID: [name], male    DOB: 08-Oct-1945, 75 y.o..   MRN: [id number]  Chief Complaint  Patient presents with   Follow-up    3 month f/u HTN. C/o still having some SOB, low energy since hospital visit 12/1020.    History of Present Illness:  Patient is in today with continued issues with profound fatigue. I had seen him on 01/16/21 in follow-up from an ER visit for a severe UTI earlier that month. At that time, he complained of fatigue. He notes this has been on-going. Since his last visit, he has developed dyspnea on exertion. This can occur when walking on flat ground, but more noticeable when walking up inclines. He does have to pause for recovery and this takes longer than he has experienced in the past. He denies any orthopnea or PND. He is not aware of any pedal edema. He has also noted some emotional lability at times and finds when he is tired, his voice loses volume. He has never been a tobacco user. He is not having cough or wheezing. [name] does have a past history of prostate cancer, s/p radiation treatment. He also has hypothyroidism. At his last assessment in March, his TSH was elevated, so his Synthroid dose was increased.  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   Thrombocytopenia ([place]) 05/03/2019   Gout 05/03/2019   Screen for colon cancer 05/03/2019   Personal history of prostate cancer 05/03/2019   Obesity (BMI 30.0-34.9) 04/26/2019   Erectile dysfunction after radical prostatectomy  04/26/2019   Past Surgical History:  Procedure Laterality Date   EYE SURGERY Bilateral 2014   HERNIA REPAIR Right 1984   inguinal   KNEE ARTHROSCOPY Left 1966   PENILE PROSTHESIS IMPLANT  2014   TOTAL KNEE ARTHROPLASTY Right 08/11/2020   Procedure: TOTAL KNEE ARTHROPLASTY;  Surgeon: Gaynelle Arabian, MD;  Location: [facility];  Service: Orthopedics;  Laterality: Right;  41min   TRANSURETHRAL RESECTION OF BLADDER TUMOR  2018   No family history on file.  Outpatient Medications Prior to Visit  Medication Sig Dispense Refill   acetaminophen (TYLENOL) 500 MG tablet Take 1,500 mg by mouth every 8 (eight) hours as needed for moderate pain.     allopurinol (ZYLOPRIM) 100 MG tablet TAKE 1 TABLET(100 MG) BY MOUTH TWICE DAILY 180 tablet 2   carvedilol (COREG) 25 MG tablet TAKE 1 TABLET(25 MG) BY MOUTH TWICE DAILY 180 tablet 2   co-enzyme Q-10 30 MG capsule Take 30 mg by mouth daily.     folic acid (FOLVITE) 1 MG tablet Take 1 mg by mouth daily.     levothyroxine (SYNTHROID) 88 MCG tablet Take 1 tablet (88 mcg total) by mouth daily before breakfast. 90 tablet 3   rosuvastatin (CRESTOR) 10 MG tablet TAKE 1 TABLET(10 MG) BY MOUTH DAILY (Patient taking differently: Take 10 mg by mouth daily.) 90 tablet 3   terbinafine (LAMISIL) 250 MG tablet Take 250 mg by mouth daily. (Patient not taking: Reported on 02/11/2021)     No facility-administered medications prior to visit.   No  Known Allergies    Objective:   Today's Vitals   02/11/21 0834  BP: 128/76  Pulse: 68  Temp: 97.6 F (36.4 C)  TempSrc: Temporal  SpO2: 98%  Weight: 223 lb 12.8 oz (101.5 kg)  Height: 5\' 11"  (1.803 m)   Body mass index is 31.21 kg/m.   General: Well developed, well nourished. No acute distress. Neck: Supple. No lymphadenopathy. No thyromegaly. No bruits. ~ 2 cm of JVD and a mildly positive JVR. Lungs: Clear to auscultation bilaterally. No wheezing, rales or rhonchi. CV: Distant heart sounds. RRR without murmurs or  rubs. Pulses 2+ bilaterally. Extremities: 1+ pretibial edema noted of both lower extremities. Psych: Alert and oriented. Normal mood and affect.  Health Maintenance Due  Topic Date Due   Hepatitis C Screening  Never done   TETANUS/TDAP  Never done   Zoster Vaccines- Shingrix (1 of 2) Never done   PNA vac Low Risk Adult (1 of 2 - PCV13) Never done   COVID-19 Vaccine (4 - Booster for Pfizer series) 12/04/2020     Lab results:  BMP Latest Ref Rng & Units 01/16/2021 01/06/2021 08/12/2020  Glucose 65 - 99 mg/dL 98 123(H) 155(H)  BUN 7 - 25 mg/dL 12 26(H) 18  Creatinine 0.70 - 1.18 mg/dL 0.91 1.23 0.97  BUN/Creat Ratio 6 - 22 (calc) NOT APPLICABLE - -  Sodium 711 - 146 mmol/L 138 134(L) 138  Potassium 3.5 - 5.3 mmol/L 5.1 4.9 4.3  Chloride 98 - 110 mmol/L 104 100 108  CO2 20 - 32 mmol/L 25 21(L) 21(L)  Calcium 8.6 - 10.3 mg/dL 9.9 10.8(H) 9.2   Lab Results  Component Value Date   TSH 5.99 (H) 11/18/2020   Imaging: Chest x-ray (01/06/2021) IMPRESSION: No acute chest findings.  Assessment & Plan:   1. Dyspnea on exertion 2. Fatigue, unspecified type Patient has had ~ a 4-month history of fatigue, originating around the time of a severe urinary tract infection. However, now is having DOE. His exam demonstrates some pretibial edema, and mild JVD and HJR. He has a history of hypertension and hypothyroidism. I will check a BNP to assess for heart failure. I will also refer him to cardiology for evaluation and a possible echocardiogram.  - B Nat Peptide - Ambulatory referral to Cardiology  3. Hypothyroidism, unspecified type Due for reassessment of his TSH. If he remains on inadequate thyroid replacement, this could contribute to his current feelings of fatigue.  - TSH  4. Essential hypertension Currently at goal on carvedilol.  Haydee Salter, MD

## 2021-02-12 ENCOUNTER — Telehealth: Payer: Self-pay | Admitting: Family Medicine

## 2021-02-12 NOTE — Telephone Encounter (Signed)
Went over lab results with patient. Patient verbally understood cardiologist with do further testing at appointment tomorrow.

## 2021-02-12 NOTE — Telephone Encounter (Signed)
Pt is wanting a cb concerning her most recent lab results. Please advise pt at [phone]

## 2021-02-13 ENCOUNTER — Other Ambulatory Visit: Payer: Self-pay

## 2021-02-13 ENCOUNTER — Ambulatory Visit (HOSPITAL_COMMUNITY)
Admission: RE | Admit: 2021-02-13 | Discharge: 2021-02-13 | Disposition: A | Discharge status: Home / Self Care | Payer: Medicare Other | Source: Ambulatory Visit | Attending: Internal Medicine | Admitting: Internal Medicine

## 2021-02-13 ENCOUNTER — Ambulatory Visit (INDEPENDENT_AMBULATORY_CARE_PROVIDER_SITE_OTHER): Payer: Medicare Other | Admitting: Internal Medicine

## 2021-02-13 VITALS — BP 108/66 | HR 65 | Ht 72.0 in | Wt 221.4 lb

## 2021-02-13 DIAGNOSIS — E785 Hyperlipidemia, unspecified: Secondary | ICD-10-CM | POA: Diagnosis not present

## 2021-02-13 DIAGNOSIS — R0609 Other forms of dyspnea: Secondary | ICD-10-CM | POA: Diagnosis present

## 2021-02-13 DIAGNOSIS — R0600 Dyspnea, unspecified: Secondary | ICD-10-CM | POA: Diagnosis not present

## 2021-02-13 DIAGNOSIS — I10 Essential (primary) hypertension: Secondary | ICD-10-CM | POA: Insufficient documentation

## 2021-02-13 DIAGNOSIS — I350 Nonrheumatic aortic (valve) stenosis: Secondary | ICD-10-CM | POA: Insufficient documentation

## 2021-02-13 DIAGNOSIS — I509 Heart failure, unspecified: Secondary | ICD-10-CM | POA: Diagnosis not present

## 2021-02-13 LAB — ECHOCARDIOGRAM COMPLETE
AR max vel: 1.5 cm2
AV Area VTI: 1.71 cm2
AV Area mean vel: 1.62 cm2
AV Mean grad: 13 mmHg
AV Peak grad: 23.2 mmHg
Ao pk vel: 2.41 m/s
Area-P 1/2: 2 cm2
Height: 72 in
S' Lateral: 3.1 cm
Weight: 3542.4 oz

## 2021-02-13 MED ORDER — PERFLUTREN LIPID MICROSPHERE
1.0000 mL | INTRAVENOUS | Status: AC | PRN
  Administered 2021-02-13: 5 mL via INTRAVENOUS
  Filled 2021-02-13: qty 10

## 2021-02-13 NOTE — Progress Notes (Signed)
Cardiology Office Note:    Date:  02/13/2021   ID:  [name], DOB Apr 29, 1946, MRN [id number]  PCP:  Libby Maw, MD  Cardiologist:  None  Electrophysiologist:  None   Referring MD: Haydee Salter, MD   Chief Complaint/Reason for Referral: Dyspnea on exertion, Fatigue  History of Present Illness:    [name] is a 75 y.o. male with a history of osteoarthritis, prostate cancer s/p radiation therapy, hyperlipidemia, essential hypertension, pre-diabeties, obesity and hypothyroidism here for initial evaluation and management of dyspnea on exertion and fatigue. He was last seen by Arlester Marker, MD on 02/11/2021 for HTN follow-up. However, he also presented with continued SOB and severe fatigue since his visit to the ED 12/2020 for a UTI and dehydration. CXR on 01/06/2021 showed no acute findings. An ambulatory referral was made to cardiology for evaluation and a possible echocardiogram.  Today, he is accompanied by his wife, Mateo Flow. Since his visit to the ED, he has very little energy with shortness of breath when doing normal activities that he could do before. He believes this was an abrupt change, and he feels like a different person. If he paces himself and gives himself chances to rest, he is able to get through most activities. He no longer does heavy lifting due to fear and embarrassment. Overall, he is feeling muscle weakness when lifting objects. If he needs to lift an object and walk with it, he feels it is mostly his bodily fatigue that prevents him. He has pressure in his bilateral shoulders. Lately his voice has been more hoarse and strained.  He feels he has to clear his throat often.  No significant coughing.  Of note, last year he was infected with COVID, and had very similar symptoms including the fatigue and weakness. During his visit to the ED 12/2020 he tested negative for COVID. He also had full-body tremors, chills, and night sweats, subsequently noted to have a  UTI. There were no acute respiratory symptoms.  He works as a Airline pilot. He is on his feet for most of the day. 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  Prior to May 2022 he was able to do this easily, but now finds this activity quite difficult.  For exercise, he is able to walk the dog for about a block before needing to stop and rest for shortness of breath. His shortness of breath improves after resting.  When home he enjoys cooking and is mostly able to do so without resting.  At one time, he had a syncopal episode s/p prostatectomy.  After trying to dislodge scar tissue he had a vagal syncopal episode when standing up after manipulation of the scar tissue.   He occasionally vapes, but has not done so in about 6 months.  His father had a heart attack as well as "insufficiency."  He denies any chest pain, or palpitations. No headaches, lightheadedness, or syncope to report. Also has no lower extremity edema, orthopnea or PND.   Past Medical History:  Diagnosis Date   Arthritis    knees. back   Cancer ([place]) 2018   High cholesterol    Hypertension    Hypothyroidism    Thyroid disease     Past Surgical History:  Procedure Laterality Date   EYE SURGERY Bilateral 2014   HERNIA REPAIR Right 1984   inguinal   KNEE ARTHROSCOPY Left 1966   PENILE PROSTHESIS IMPLANT  2014  TOTAL KNEE ARTHROPLASTY Right 08/11/2020   Procedure: TOTAL KNEE ARTHROPLASTY;  Surgeon: Gaynelle Arabian, MD;  Location: [facility];  Service: Orthopedics;  Laterality: Right;  31min   TRANSURETHRAL RESECTION OF BLADDER TUMOR  2018    Current Medications: Current Meds  Medication Sig   allopurinol (ZYLOPRIM) 100 MG tablet TAKE 1 TABLET(100 MG) BY MOUTH TWICE DAILY   carvedilol (COREG) 25 MG tablet TAKE 1 TABLET(25 MG) BY MOUTH TWICE DAILY   co-enzyme Q-10 30 MG capsule Take 30 mg by mouth daily.   folic acid (FOLVITE) 1 MG tablet Take 1 mg by  mouth daily.   levothyroxine (SYNTHROID) 88 MCG tablet Take 1 tablet (88 mcg total) by mouth daily before breakfast.   rosuvastatin (CRESTOR) 10 MG tablet TAKE 1 TABLET(10 MG) BY MOUTH DAILY     Allergies:   Patient has no known allergies.   Social History   Tobacco Use   Smoking status: Never   Smokeless tobacco: Never  Vaping Use   Vaping Use: Former   Start date: 08/01/2017   Substances: Mixture of cannabinoids  Substance Use Topics   Alcohol use: Yes    Alcohol/week: 2.0 standard drinks    Types: 2 Standard drinks or equivalent per week   Drug use: Yes    Types: Marijuana     Family History: The patient's family history is not on file.  ROS:   Please see the history of present illness.    (+) Fatigue (+) Shortness of breath (+) Muscle weakness (+) Pressure, bilateral shoulders (+) Strained voice All other systems reviewed and are negative.  EKGs/Labs/Other Studies Reviewed:    The following studies were reviewed today: No prior cardiac studies available.  EKG:  02/13/2021: NSR, rate 65  I have independently reviewed the images from chest x-ray 01/06/2021.  No evidence of pulmonary interstitial edema or cardiomegaly.  Recent Labs: 01/06/2021: ALT 20; Hemoglobin 13.6; Platelets 300 01/16/2021: BUN 12; Creat 0.91; Potassium 5.1; Sodium 138 02/11/2021: Pro B Natriuretic peptide (BNP) 147.0; TSH 1.06  Recent Lipid Panel    Component Value Date/Time   CHOL 156 11/18/2020 0820   TRIG 177.0 (H) 11/18/2020 0820   HDL 46.70 11/18/2020 0820   CHOLHDL 3 11/18/2020 0820   VLDL 35.4 11/18/2020 0820   LDLCALC 74 11/18/2020 0820   LDLDIRECT 81 01/11/2020 1656    Physical Exam:    VS:  BP 108/66   Pulse 65   Ht 6' (1.829 m)   Wt 221 lb 6.4 oz (100.4 kg)   SpO2 100%   BMI 30.03 kg/m     Wt Readings from Last 5 Encounters:  02/13/21 221 lb 6.4 oz (100.4 kg)  02/11/21 223 lb 12.8 oz (101.5 kg)  01/16/21 222 lb (100.7 kg)  01/05/21 240 lb (108.9 kg)  10/31/20 240  lb (108.9 kg)    Constitutional: No acute distress Eyes: sclera non-icteric, normal conjunctiva and lids ENMT: normal dentition, moist mucous membranes Cardiovascular: regular rhythm, normal rate, 2/6  holosystolic murmur at the apex. S1 and S2 normal. Radial pulses normal bilaterally. No jugular venous distention.  Respiratory: clear to auscultation bilaterally GI : normal bowel sounds, soft and nontender. No distention.   MSK: extremities warm, well perfused. Varicose veins distally.  Bilateral trace ankle edema.  NEURO: grossly nonfocal exam, moves all extremities. PSYCH: alert and oriented x 3, normal mood and affect.   ASSESSMENT:    1. Dyspnea on exertion   2. Heart failure, unspecified HF chronicity, unspecified heart failure type (  [place])    PLAN:    Dyspnea on exertion - Plan: EKG 12-Lead, ECHOCARDIOGRAM COMPLETE  Heart failure, unspecified HF chronicity, unspecified heart failure type ([place]) - Plan: EKG 12-Lead, ECHOCARDIOGRAM COMPLETE  He feels he abruptly began having dyspnea on exertion and significant fatigue after presentation to the ED with a UTI which involved significant fever and chills as well as rigors.  He did not require hospital admission at that time, but I am concerned that this may have represented SIRS or sepsis given his description.  He has had a BNP which is mildly elevated at 147 and has at least class II heart failure symptoms with dyspnea on exertion.  Next best step would be an echocardiogram to exclude endocarditis in the setting of possible bacteremia associated with UTI several weeks ago, or potentially stress induced cardiomyopathy from illness.  I am able to obtain an echocardiogram on him today and we will follow-up results afterward.  If EF is reduced which would be a new finding, would consider an ischemic evaluation given risk factors of age, male gender, hypertension, hyperlipidemia, prediabetes.   Cherlynn Kaiser, MD, [facility]   Medication Adjustments/Labs and Tests Ordered: Current medicines are reviewed at length with the patient today.  Concerns regarding medicines are outlined above.   Orders Placed This Encounter  Procedures   EKG 12-Lead   ECHOCARDIOGRAM COMPLETE    No orders of the defined types were placed in this encounter.   Patient Instructions  Medication Instructions:  No changes   *If you need a refill on your cardiac medications before your next appointment, please call your pharmacy*   Lab Work: Not needed    Testing/Procedures:  Schedule today for  3 pm  [facility] [place]. Your physician has requested that you have an echocardiogram. Echocardiography is a painless test that uses sound waves to create images of your heart. It provides your doctor with information about the size and shape of your heart and how well your heart's chambers and valves are working. This procedure takes approximately one hour. There are no restrictions for this procedure.   Follow-Up: At [facility], you and your health needs are our priority.  As part of our continuing mission to provide you with exceptional heart care, we have created designated Provider Care Teams.  These Care Teams include your primary Cardiologist (physician) and Advanced Practice Providers (APPs -  Physician Assistants and Nurse Practitioners) who all work together to provide you with the care you need, when you need it.     Your next appointment:    June 28 , 2022 at 2:30 pm  The format for your next appointment:   In Person  Provider:    Dr Margaretann Loveless    Other Instructions                                 [facility]                                      [address]  Phone  [phone]                                             Please note  Parking for the office is located in the parking garage under our  [place]:  -[place] (1ST STOPLIGHT)  - [place] ON RIGHT ENTRANCE C)  -BEAR TO THE RIGHT AND YOU WILL SEE A BLUE SIGN "[facility]  PARKING CODE REQUIRED"  - Enter code:  [place], TAKE THE ELEVATOR TO [place] 1st FLOOR  ----------------------------------------------------------------------------------------------------  FROM [address]  -[place] ON LEFT (ENTRANCE C)  -BEAR TO THE RIGHT AND YOU WILL SEE A BLUE SIGN "[facility] PARKING CODE REQUIRED"  - ENTER  CODE  4233  - ONCE YOU PARK, TAKE THE ELEVATOR TO THE 1st FLOOR.        I,Mathew Stumpf,acting as a Education administrator for Elouise Munroe, MD.,have documented all relevant documentation on the behalf of Elouise Munroe, MD,as directed by  Elouise Munroe, MD while in the presence of Elouise Munroe, MD.  I, Elouise Munroe, MD, have reviewed all documentation for this visit. The documentation on 02/13/21 for the exam, diagnosis, procedures, and orders are all accurate and complete.

## 2021-02-13 NOTE — Patient Instructions (Addendum)
Medication Instructions:  No changes   *If you need a refill on your cardiac medications before your next appointment, please call your pharmacy*   Lab Work: Not needed    Testing/Procedures:  Schedule today for  3 pm  at [facility] [place] has requested that you have an echocardiogram. Echocardiography is a painless test that uses sound waves to create images of your heart. It provides your doctor with information about the size and shape of your heart and how well your heart's chambers and valves are working. This procedure takes approximately one hour. There are no restrictions for this procedure.   Follow-Up: At [facility], you and your health needs are our priority.  As part of our continuing mission to provide you with exceptional heart care, we have created designated Provider Care Teams.  These Care Teams include your primary Cardiologist (physician) and Advanced Practice Providers (APPs -  Physician Assistants and Nurse Practitioners) who all work together to provide you with the care you need, when you need it.     Your next appointment:    June 28 , 2022 at 2:30 pm  The format for your next appointment:   In Person  Provider:    Dr Margaretann Loveless    Other Instructions                                 [facility]                                      [address]                                        Phone  [phone]                                             Please note  Parking for the office is located in the parking garage under our [place]:  -[place] (1ST STOPLIGHT)  - [place] ON RIGHT ENTRANCE C)  -BEAR TO THE RIGHT AND YOU WILL SEE A BLUE SIGN "HEART&VASCULAR CENTER  PARKING CODE REQUIRED"  - Enter code:  [place], TAKE THE ELEVATOR TO [place] 1st  FLOOR  ----------------------------------------------------------------------------------------------------  FROM [address]  -[place] ON LEFT (ENTRANCE C)  -BEAR TO THE RIGHT AND YOU WILL SEE A BLUE SIGN "HEART &VASCULAR CENTER PARKING CODE REQUIRED"  - ENTER  CODE  4233  - ONCE YOU PARK, TAKE THE ELEVATOR TO THE 1st FLOOR.

## 2021-02-16 ENCOUNTER — Telehealth: Payer: Self-pay | Admitting: Internal Medicine

## 2021-02-16 DIAGNOSIS — R9430 Abnormal result of cardiovascular function study, unspecified: Secondary | ICD-10-CM

## 2021-02-16 DIAGNOSIS — R0609 Other forms of dyspnea: Secondary | ICD-10-CM

## 2021-02-16 DIAGNOSIS — I5189 Other ill-defined heart diseases: Secondary | ICD-10-CM

## 2021-02-16 NOTE — Telephone Encounter (Signed)
Follow Up:    Pt said he would like for somebody to please call him and go over and explain his Echo results. He saw it on My Chart, but did not understand it.

## 2021-02-16 NOTE — Telephone Encounter (Signed)
Will route this message to Dr. Delphina Cahill primary covering RN, to further follow-up and assist the pt with echo results.

## 2021-02-16 NOTE — Telephone Encounter (Signed)
Returned call to patient, advised patient of the following regarding his recent Echo results.    Elouise Munroe, MD  02/15/2021  1:12 PM EDT      Pumping function of the heart is overall normal. There is a structure pushing on the right side of the heart but not causing major problems. 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 Caela Huot please order a coronary CTA for dyspnea on exertion and cardiac mass. I will send a message to scheduling to ensure it gets scheduled with astructural reader.     Order for Coronary CTA has been placed. Order for BMET placed as well. Patient states that his heart rate usually runs 50-55 BPM and is currently taking Carvedilol twice daily. Patient aware of CTA instructions and verbalized understanding.   Message sent to Pre Cert team as well.   Advised patient to call back to office with any issues, questions, or concerns. Patient verbalized understanding.

## 2021-02-19 NOTE — Telephone Encounter (Signed)
Patients Coronary CTA is scheduled for 7/13.

## 2021-02-23 LAB — BASIC METABOLIC PANEL
BUN/Creatinine Ratio: 13 (ref 10–24)
BUN: 14 mg/dL (ref 8–27)
CO2: 22 mmol/L (ref 20–29)
Calcium: 10.7 mg/dL — ABNORMAL HIGH (ref 8.6–10.2)
Chloride: 100 mmol/L (ref 96–106)
Creatinine, Ser: 1.09 mg/dL (ref 0.76–1.27)
Glucose: 116 mg/dL — ABNORMAL HIGH (ref 65–99)
Potassium: 5.2 mmol/L (ref 3.5–5.2)
Sodium: 136 mmol/L (ref 134–144)
eGFR: 71 mL/min/{1.73_m2} (ref >59)

## 2021-02-24 ENCOUNTER — Ambulatory Visit (INDEPENDENT_AMBULATORY_CARE_PROVIDER_SITE_OTHER): Payer: Medicare Other | Admitting: Internal Medicine

## 2021-02-24 ENCOUNTER — Other Ambulatory Visit: Payer: Self-pay

## 2021-02-24 ENCOUNTER — Encounter: Payer: Self-pay | Admitting: Internal Medicine

## 2021-02-24 VITALS — BP 122/76 | HR 66 | Ht 72.0 in | Wt 216.2 lb

## 2021-02-24 DIAGNOSIS — I10 Essential (primary) hypertension: Secondary | ICD-10-CM | POA: Diagnosis not present

## 2021-02-24 DIAGNOSIS — I5189 Other ill-defined heart diseases: Secondary | ICD-10-CM

## 2021-02-24 DIAGNOSIS — R0600 Dyspnea, unspecified: Secondary | ICD-10-CM | POA: Diagnosis not present

## 2021-02-24 DIAGNOSIS — E785 Hyperlipidemia, unspecified: Secondary | ICD-10-CM

## 2021-02-24 DIAGNOSIS — R9430 Abnormal result of cardiovascular function study, unspecified: Secondary | ICD-10-CM | POA: Diagnosis not present

## 2021-02-24 DIAGNOSIS — I509 Heart failure, unspecified: Secondary | ICD-10-CM

## 2021-02-24 DIAGNOSIS — R0609 Other forms of dyspnea: Secondary | ICD-10-CM

## 2021-02-24 NOTE — Progress Notes (Signed)
Cardiology Office Note:    Date:  02/24/2021   ID:  [name], DOB 1946-02-07, MRN [id number]  PCP:  Libby Maw, MD  Cardiologist:  None  Electrophysiologist:  None   Referring MD: Libby Maw   Chief Complaint/Reason for Referral: Dyspnea on exertion, Fatigue  History of Present Illness:    [name] is a 75 y.o. male with a history of osteoarthritis, prostate cancer s/p radiation therapy, hyperlipidemia, essential hypertension, pre-diabeties, obesity and hypothyroidism here for initial evaluation and management of dyspnea on exertion and fatigue. He was last seen by Arlester Marker, MD on 02/11/2021 for HTN follow-up. However, he also presented with continued SOB and severe fatigue since his visit to the ED 12/2020 for a UTI and dehydration. CXR on 01/06/2021 showed no acute findings. An ambulatory referral was made to cardiology for evaluation.  Today, he is accompanied by his wife, who also provides some history. His shortness of breath is worsening, and he has constant pressure in his chest. He notes not being able to do minor activities in his daily routine without feeling fatigued and severely short of breath. This includes taking a shower, walking short distances at home (car to house), and after walking between offices at work, he needs to sit down and rest.  He feels emotional and depressed, and is tearful at times.  He has lost about 35 pounds over the past 8 weeks. They also attribute this to his severe loss of appetite.  Previously, he has not been screened for a pulmonary embolism. We also discussed the option of a heart catheterization. I stressed the importance of visiting the ED due to his severe DOE. After discussion, he would like to wait on ED presentation.   He had 40 straight days of hyperbaric oxygen treatment after treatment for prostate cancer and bleeding due to bladder issues. It has been 8 months since his final treatment.   He denies any  palpitations. No headaches, lightheadedness, or syncope to report. Also has no lower extremity edema, orthopnea or PND.   Past Medical History:  Diagnosis Date   Arthritis    knees. back   Cancer ([place]) 2018   High cholesterol    Hypertension    Hypothyroidism    Thyroid disease     Past Surgical History:  Procedure Laterality Date   EYE SURGERY Bilateral 2014   HERNIA REPAIR Right 1984   inguinal   KNEE ARTHROSCOPY Left 1966   PENILE PROSTHESIS IMPLANT  2014   TOTAL KNEE ARTHROPLASTY Right 08/11/2020   Procedure: TOTAL KNEE ARTHROPLASTY;  Surgeon: Gaynelle Arabian, MD;  Location: [facility];  Service: Orthopedics;  Laterality: Right;  63min   TRANSURETHRAL RESECTION OF BLADDER TUMOR  2018    Current Medications: Current Meds  Medication Sig   allopurinol (ZYLOPRIM) 100 MG tablet TAKE 1 TABLET(100 MG) BY MOUTH TWICE DAILY   carvedilol (COREG) 25 MG tablet TAKE 1 TABLET(25 MG) BY MOUTH TWICE DAILY   co-enzyme Q-10 30 MG capsule Take 30 mg by mouth daily.   folic acid (FOLVITE) 1 MG tablet Take 1 mg by mouth daily.   levothyroxine (SYNTHROID) 88 MCG tablet Take 1 tablet (88 mcg total) by mouth daily before breakfast.   rosuvastatin (CRESTOR) 10 MG tablet TAKE 1 TABLET(10 MG) BY MOUTH DAILY     Allergies:   Patient has no known allergies.   Social History   Tobacco Use   Smoking status: Never   Smokeless tobacco: Never  Vaping Use  Vaping Use: Former   Start date: 08/01/2017   Substances: Mixture of cannabinoids  Substance Use Topics   Alcohol use: Yes    Alcohol/week: 2.0 standard drinks    Types: 2 Standard drinks or equivalent per week   Drug use: Yes    Types: Marijuana     Family History: The patient's family history is not on file.  ROS:   Please see the history of present illness.    (+) Constant chest pressure (+) Shortness of breath (+) Significant DOE (+) Fatigue (+) Strained voice (+) Depressed (+) Tearful (+) Loss of appetite All other systems  reviewed and are negative.  EKGs/Labs/Other Studies Reviewed:    The following studies were reviewed today:  Echo 02/13/2021: 1. Left ventricular ejection fraction, by estimation, is 60 to 65%. The  left ventricle has normal function. The left ventricle has no regional  wall motion abnormalities. Left ventricular diastolic parameters are  consistent with Grade I diastolic  dysfunction (impaired relaxation).   2. Right ventricular systolic function is mildly reduced. The right  ventricular size is normal.   3. 3.5 x 6.5 cm echolucent oval structure adjacent to the posterior wall  -apperas extracardia - seems to compress the RV some. Consider pericardial  cyst, hiatal hernia or other extracardiac masses such as hepatic mass/cyst  - cross-sectional imaging such   as CT may be helpful.   4. The mitral valve is abnormal. Trivial mitral valve regurgitation.   5. The aortic valve is calcified. Aortic valve regurgitation is not  visualized. Mild aortic valve stenosis. Aortic valve area, by VTI measures  1.71 cm. Aortic valve mean gradient measures 13.0 mmHg. Aortic valve Vmax  measures 2.41 m/s. Peak gradient  23.2 mmHg. DI is 0.54.   6. The inferior vena cava is normal in size with greater than 50%  respiratory variability, suggesting right atrial pressure of 3 mmHg.   Conclusion(s)/Recommendation(s): Extracardiac echolucent structure -  recommend additional cross-sectional imaging. Critical findings reported  to Dr. Margaretann Loveless and acknowledged at 4:40 pm on 02/13/2021.  EKG:  02/24/2021: NSR, rate 66 02/13/2021: NSR, rate 65  I have independently reviewed the images from chest x-ray 01/06/2021.  No evidence of pulmonary interstitial edema or cardiomegaly.  Recent Labs: 01/06/2021: ALT 20; Hemoglobin 13.6; Platelets 300 02/11/2021: Pro B Natriuretic peptide (BNP) 147.0; TSH 1.06 02/23/2021: BUN 14; Creatinine, Ser 1.09; Potassium 5.2; Sodium 136  Recent Lipid Panel    Component Value  Date/Time   CHOL 156 11/18/2020 0820   TRIG 177.0 (H) 11/18/2020 0820   HDL 46.70 11/18/2020 0820   CHOLHDL 3 11/18/2020 0820   VLDL 35.4 11/18/2020 0820   LDLCALC 74 11/18/2020 0820   LDLDIRECT 81 01/11/2020 1656    Physical Exam:    VS:  BP 122/76 (BP Location: Left Arm, Patient Position: Sitting, Cuff Size: Normal)   Pulse 66   Ht 6' (1.829 m)   Wt 216 lb 3.2 oz (98.1 kg)   SpO2 96%   BMI 29.32 kg/m     Wt Readings from Last 5 Encounters:  02/24/21 216 lb 3.2 oz (98.1 kg)  02/13/21 221 lb 6.4 oz (100.4 kg)  02/11/21 223 lb 12.8 oz (101.5 kg)  01/16/21 222 lb (100.7 kg)  01/05/21 240 lb (108.9 kg)    Constitutional: No acute distress Eyes: sclera non-icteric, normal conjunctiva and lids, tearful at times ENMT: normal dentition, moist mucous membranes Cardiovascular: regular rhythm, normal rate, 1/6 HS murmur at the apex. S1 and S2 normal. Radial  pulses normal bilaterally. No jugular venous distention.  Respiratory: clear to auscultation bilaterally GI : normal bowel sounds, soft and nontender. No distention.   MSK: extremities warm, well perfused. Varicose veins distally.  Bilateral trace ankle edema.  NEURO: grossly nonfocal exam, moves all extremities. PSYCH: alert and oriented x 3, normal mood and affect.   ASSESSMENT:    1. Dyspnea on exertion   2. Cardiac mass   3. Abnormal result of cardiovascular function study, unspecified    4. Heart failure, unspecified HF chronicity, unspecified heart failure type ([place])   5. Hyperlipidemia, unspecified hyperlipidemia type   6. Essential hypertension     PLAN:    Dyspnea on exertion - Plan: D-dimer, quantitative  Cardiac mass  Abnormal result of cardiovascular function study, unspecified   Heart failure, unspecified HF chronicity, unspecified heart failure type ([place])  I am very concerned about patients reported weight loss, dyspnea and fatigue. Would like to exclude PE given significant shortness of breath. We will  start with a d-dimer. He will also have his CCTA tomorrow which if D-dimer elevated we can also perform a CT PE study. We need to characterize mass adjacent to RV, with worsening symptoms I am worried this may play a role.   I have recommended presentation to the ED which he defers.   Total time of encounter: 30 minutes total time of encounter, including 25 minutes spent in face-to-face patient care on the date of this encounter. This time includes coordination of care and counseling regarding above mentioned problem list. Remainder of non-face-to-face time involved reviewing chart documents/testing relevant to the patient encounter and documentation in the medical record. I have independently reviewed documentation from referring provider.   Cherlynn Kaiser, MD, [facility]      Medication Adjustments/Labs and Tests Ordered: Current medicines are reviewed at length with the patient today.  Concerns regarding medicines are outlined above.   Orders Placed This Encounter  Procedures   D-dimer, quantitative   EKG 12-Lead     No orders of the defined types were placed in this encounter.   Patient Instructions  Medication Instructions:  No Changes In Medications at this time.  *If you need a refill on your cardiac medications before your next appointment, please call your pharmacy*  Lab Work: [place]  If you have labs (blood work) drawn today and your tests are completely normal, you will receive your results only by: [place] (if you have MyChart) OR A paper copy in the mail If you have any lab test that is abnormal or we need to change your treatment, we will call you to review the results.  Follow-Up: At [facility], you and your health needs are our priority.  As part of our continuing mission to provide you with exceptional heart care, we have created designated Provider Care Teams.  These Care Teams include your primary  Cardiologist (physician) and Advanced Practice Providers (APPs -  Physician Assistants and Nurse Practitioners) who all work together to provide you with the care you need, when you need it.  Your next appointment:   TO BE DETERMINED AFTER CCTA   The format for your next appointment:   In Person  Provider:   Cherlynn Kaiser, MD    Your cardiac CT will be scheduled at one of the below locations:   [facility] [address] [place], [place] [address] [phone]  If scheduled at [facility], please arrive  at the [facility] main entrance (entrance A) of [facility] 30 minutes prior to test start time. Proceed to the [facility] Radiology Department (first floor) to check-in and test prep.  Please follow these instructions carefully (unless otherwise directed):  Hold all erectile dysfunction medications at least 3 days (72 hrs) prior to test.  On the Night Before the Test: Be sure to Drink plenty of water. Do not consume any caffeinated/decaffeinated beverages or chocolate 12 hours prior to your test. Do not take any antihistamines 12 hours prior to your test. If the patient has contrast allergy:  On the Day of the Test: Drink plenty of water until 1 hour prior to the test. Do not eat any food 4 hours prior to the test. You may take your regular medications prior to the test.  Take Carvedilol two hours prior to test. HOLD Furosemide/Hydrochlorothiazide morning of the test.  After the Test: Drink plenty of water. After receiving IV contrast, you may experience a mild flushed feeling. This is normal. On occasion, you may experience a mild rash up to 24 hours after the test. This is not dangerous. If this occurs, you can take Benadryl 25 mg and increase your fluid intake. If you experience trouble breathing, this can be serious. If it is severe call 911 IMMEDIATELY. If it is mild, please call our office. If you take any of these medications:  Glipizide/Metformin, Avandament, Glucavance, please do not take 48 hours after completing test unless otherwise instructed.  Once we have confirmed authorization from your insurance company, we will call you to set up a date and time for your test. Based on how quickly your insurance processes prior authorizations requests, please allow up to 4 weeks to be contacted for scheduling your Cardiac CT appointment. Be advised that routine Cardiac CT appointments could be scheduled as many as 8 weeks after your provider has ordered it.  For non-scheduling related questions, please contact the cardiac imaging nurse navigator should you have any questions/concerns: Marchia Bond, Cardiac Imaging Nurse Navigator Gordy Clement, Cardiac Imaging Nurse Navigator [facility] Direct Office Dial: [phone]   For scheduling needs, including cancellations and rescheduling, please call [place], [phone].    I,Mathew Stumpf,acting as a Education administrator for Elouise Munroe, MD.,have documented all relevant documentation on the behalf of Elouise Munroe, MD,as directed by  Elouise Munroe, MD while in the presence of Elouise Munroe, MD.  I, Elouise Munroe, MD, have reviewed all documentation for this visit. The documentation on 02/24/21 for the exam, diagnosis, procedures, and orders are all accurate and complete.

## 2021-02-24 NOTE — Patient Instructions (Addendum)
Medication Instructions:  No Changes In Medications at this time.  *If you need a refill on your cardiac medications before your next appointment, please call your pharmacy*  Lab Work: [place]  If you have labs (blood work) drawn today and your tests are completely normal, you will receive your results only by: [place] (if you have MyChart) OR A paper copy in the mail If you have any lab test that is abnormal or we need to change your treatment, we will call you to review the results.  Follow-Up: At [facility], you and your health needs are our priority.  As part of our continuing mission to provide you with exceptional heart care, we have created designated Provider Care Teams.  These Care Teams include your primary Cardiologist (physician) and Advanced Practice Providers (APPs -  Physician Assistants and Nurse Practitioners) who all work together to provide you with the care you need, when you need it.  Your next appointment:   TO BE DETERMINED AFTER CCTA   The format for your next appointment:   In Person  Provider:   Cherlynn Kaiser, MD    Your cardiac CT will be scheduled at one of the below locations:   [facility] [address] [place] [address] [phone]  If scheduled at [facility], please arrive at the [facility] main entrance (entrance A) of [facility] 30 minutes prior to test start time. Proceed to the [facility] Radiology Department (first floor) to check-in and test prep.  Please follow these instructions carefully (unless otherwise directed):  Hold all erectile dysfunction medications at least 3 days (72 hrs) prior to test.  On the Night Before the Test: Be sure to Drink plenty of water. Do not consume any caffeinated/decaffeinated beverages or chocolate 12 hours prior to your test. Do not take any antihistamines 12 hours prior to your test. If the patient has contrast  allergy:  On the Day of the Test: Drink plenty of water until 1 hour prior to the test. Do not eat any food 4 hours prior to the test. You may take your regular medications prior to the test.  Take Carvedilol two hours prior to test. HOLD Furosemide/Hydrochlorothiazide morning of the test.  After the Test: Drink plenty of water. After receiving IV contrast, you may experience a mild flushed feeling. This is normal. On occasion, you may experience a mild rash up to 24 hours after the test. This is not dangerous. If this occurs, you can take Benadryl 25 mg and increase your fluid intake. If you experience trouble breathing, this can be serious. If it is severe call 911 IMMEDIATELY. If it is mild, please call our office. If you take any of these medications: Glipizide/Metformin, Avandament, Glucavance, please do not take 48 hours after completing test unless otherwise instructed.  Once we have confirmed authorization from your insurance company, we will call you to set up a date and time for your test. Based on how quickly your insurance processes prior authorizations requests, please allow up to 4 weeks to be contacted for scheduling your Cardiac CT appointment. Be advised that routine Cardiac CT appointments could be scheduled as many as 8 weeks after your provider has ordered it.  For non-scheduling related questions, please contact the cardiac imaging nurse navigator should you have any questions/concerns: Marchia Bond, Cardiac Imaging Nurse Navigator Gordy Clement, Cardiac Imaging Nurse Navigator [place] [facility] Direct Office Dial: [phone]  For scheduling needs, including cancellations and rescheduling, please call [place], [phone].

## 2021-02-25 ENCOUNTER — Ambulatory Visit (HOSPITAL_COMMUNITY)
Admission: RE | Admit: 2021-02-25 | Discharge: 2021-02-25 | Disposition: A | Discharge status: Home / Self Care | Payer: Medicare Other | Source: Ambulatory Visit | Attending: Internal Medicine | Admitting: Internal Medicine

## 2021-02-25 ENCOUNTER — Other Ambulatory Visit: Payer: Self-pay | Admitting: Family Medicine

## 2021-02-25 ENCOUNTER — Encounter: Payer: Self-pay | Admitting: Family Medicine

## 2021-02-25 ENCOUNTER — Other Ambulatory Visit: Payer: Self-pay | Admitting: Internal Medicine

## 2021-02-25 ENCOUNTER — Other Ambulatory Visit (HOSPITAL_COMMUNITY): Payer: Self-pay | Admitting: Internal Medicine

## 2021-02-25 ENCOUNTER — Telehealth: Payer: Self-pay | Admitting: Internal Medicine

## 2021-02-25 DIAGNOSIS — R7989 Other specified abnormal findings of blood chemistry: Secondary | ICD-10-CM

## 2021-02-25 DIAGNOSIS — I5189 Other ill-defined heart diseases: Secondary | ICD-10-CM | POA: Insufficient documentation

## 2021-02-25 DIAGNOSIS — R9430 Abnormal result of cardiovascular function study, unspecified: Secondary | ICD-10-CM

## 2021-02-25 DIAGNOSIS — K750 Abscess of liver: Secondary | ICD-10-CM | POA: Insufficient documentation

## 2021-02-25 DIAGNOSIS — R0600 Dyspnea, unspecified: Secondary | ICD-10-CM | POA: Insufficient documentation

## 2021-02-25 DIAGNOSIS — R0609 Other forms of dyspnea: Secondary | ICD-10-CM

## 2021-02-25 DIAGNOSIS — R160 Hepatomegaly, not elsewhere classified: Secondary | ICD-10-CM

## 2021-02-25 DIAGNOSIS — I700 Atherosclerosis of aorta: Secondary | ICD-10-CM | POA: Diagnosis not present

## 2021-02-25 LAB — D-DIMER, QUANTITATIVE: D-DIMER: 1.97 mg/L FEU — ABNORMAL HIGH (ref 0.00–0.49)

## 2021-02-25 MED ORDER — IOHEXOL 350 MG/ML SOLN
150.0000 mL | Freq: Once | INTRAVENOUS | Status: AC | PRN
  Administered 2021-02-25: 150 mL via INTRAVENOUS

## 2021-02-25 MED ORDER — NITROGLYCERIN 0.4 MG SL SUBL
SUBLINGUAL_TABLET | SUBLINGUAL | Status: AC
  Filled 2021-02-25: qty 2

## 2021-02-25 MED ORDER — NITROGLYCERIN 0.4 MG SL SUBL
0.8000 mg | SUBLINGUAL_TABLET | Freq: Once | SUBLINGUAL | Status: AC
  Administered 2021-02-25: 0.8 mg via SUBLINGUAL

## 2021-02-25 NOTE — Telephone Encounter (Signed)
Received call from radiology --  impression for Ct Angio chest for Pulm Embolus Report is in Epic for review will send message to Dr Margaretann Loveless to review

## 2021-02-25 NOTE — Telephone Encounter (Signed)
Please see result note   Elouise Munroe, MD  02/25/2021  2:51 PM EDT      I have discussed results with patient and Dr. Gena Fray. Dr. Gena Fray will coordinate further imaging recommended by radiology. Donnelle Olmeda please arrange follow up in 6-8weeks with [place].  Staff Message sent to scheduling for follow up OV to be scheduled with Dr. Margaretann Loveless.

## 2021-02-25 NOTE — Telephone Encounter (Signed)
[name] is calling to give a call report to a nurse for a CTA result. Please advise.

## 2021-02-25 NOTE — Progress Notes (Signed)
CT Angiography of the Chest (02/25/2021) IMPRESSION: 1. No CT evidence for acute pulmonary embolus. 2. 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 This could be a primary hepatic neoplasm or metastatic lesion. Abscess considered less likely. Consider abdomen CT with and without contrast plus pelvis CT with contrast to further evaluate. Multiphase liver protocol suggested. 3. Mild lymphadenopathy in the hepatoduodenal ligament. 4. Tiny nonobstructing right renal stones. 5.  Aortic Atherosclerois (ICD10-170.0)  I spoke with Dr.  Margaretann Loveless. She has shared the findings with Mr. Fredia Sorrow. She will be out of town for the next two weeks. I agreed to order the recommended scans and follow for results and any further referral needs.  Ruthann Cancer, MD

## 2021-02-25 NOTE — Progress Notes (Signed)
Patient tolerated CT well. Stated did feel slightly lightheaded. Gave 3 small bottle of waters and ate cheez-it crackers after. Denies light headedness.Vital signs stable encourage to drink water throughout day.Reasons explained and verbalized understanding. Ambulated steady gait.

## 2021-02-26 ENCOUNTER — Telehealth: Payer: Self-pay | Admitting: Internal Medicine

## 2021-02-26 NOTE — Telephone Encounter (Signed)
Pt is calling to check the status of the doctor speaking with his PCP

## 2021-02-26 NOTE — Telephone Encounter (Signed)
Elouise Munroe, MD  02/25/2021  2:51 PM EDT        I have discussed results with patient and Dr. Gena Fray. Dr. Gena Fray will coordinate further imaging recommended by radiology. Jenna please arrange follow up in 6-8weeks with [place].    Called patient. No answer- left detailed message-  left message that Dr Margaretann Loveless spoke to Dr Gena Fray. Dr Gena Fray will be coordinating further studies that were recommended.  Office will be calling to set up an follow appointment in 6 to 8 weeks with Dr Margaretann Loveless. Any question may call back.

## 2021-02-27 ENCOUNTER — Encounter: Payer: Self-pay | Admitting: Family Medicine

## 2021-02-27 ENCOUNTER — Ambulatory Visit (INDEPENDENT_AMBULATORY_CARE_PROVIDER_SITE_OTHER): Payer: Medicare Other | Admitting: Family Medicine

## 2021-02-27 ENCOUNTER — Other Ambulatory Visit: Payer: Self-pay

## 2021-02-27 VITALS — BP 124/68 | HR 74 | Temp 98.6°F | Ht 72.0 in | Wt 214.2 lb

## 2021-02-27 DIAGNOSIS — R160 Hepatomegaly, not elsewhere classified: Secondary | ICD-10-CM

## 2021-02-27 DIAGNOSIS — F329 Major depressive disorder, single episode, unspecified: Secondary | ICD-10-CM

## 2021-02-27 DIAGNOSIS — N200 Calculus of kidney: Secondary | ICD-10-CM | POA: Insufficient documentation

## 2021-02-27 DIAGNOSIS — Z8546 Personal history of malignant neoplasm of prostate: Secondary | ICD-10-CM | POA: Diagnosis not present

## 2021-02-27 DIAGNOSIS — I700 Atherosclerosis of aorta: Secondary | ICD-10-CM | POA: Insufficient documentation

## 2021-02-27 MED ORDER — PAROXETINE HCL 10 MG PO TABS: 10.0000 mg | ORAL_TABLET | Freq: Every day | ORAL | 1 refills | Status: DC

## 2021-02-27 MED ORDER — ALPRAZOLAM 1 MG PO TABS: ORAL_TABLET | ORAL | 1 refills | Status: DC

## 2021-02-27 NOTE — Progress Notes (Signed)
[facility] [address] [place] [address] Dept: [phone] Dept Fax: [phone]  Office Visit  Subjective:    Patient ID: [name], male    DOB: 1945-11-08, 75 y.o..   MRN: [id number]  Chief Complaint  Patient presents with   Follow-up    F/u from cardiologist.      History of Present Illness:  Patient is in today to review the results of his recent CT scan. [name] is aware that his recent scan showed a mass, but has many questions about this.Additionally, he admits to feelings of depression, even predating the recent news. Since learning of the mass, he admits to some episodes of anxiety. He notes he has been experiencing weight loss and had issues with feeling bloated easily after meals.   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   Thrombocytopenia ([place]) 05/03/2019   Gout 05/03/2019   Screen for colon cancer 05/03/2019   Personal history of prostate cancer 05/03/2019   Obesity (BMI 30.0-34.9) 04/26/2019   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   Past Surgical History:  Procedure Laterality Date   EYE SURGERY Bilateral 2014   HERNIA REPAIR Right 1984   inguinal   KNEE ARTHROSCOPY Left 1966   PENILE PROSTHESIS IMPLANT  2014   TOTAL KNEE ARTHROPLASTY Right 08/11/2020   Procedure: TOTAL KNEE ARTHROPLASTY;  Surgeon: Gaynelle Arabian, MD;  Location: [facility];  Service: Orthopedics;  Laterality: Right;  55min   TRANSURETHRAL RESECTION OF BLADDER TUMOR  2018   History reviewed. No pertinent family history.  Outpatient Medications Prior to Visit  Medication Sig  Dispense Refill   allopurinol (ZYLOPRIM) 100 MG tablet TAKE 1 TABLET(100 MG) BY MOUTH TWICE DAILY 180 tablet 2   carvedilol (COREG) 25 MG tablet TAKE 1 TABLET(25 MG) BY MOUTH TWICE DAILY 180 tablet 2   co-enzyme Q-10 30 MG capsule Take 30 mg by mouth daily.     folic acid (FOLVITE) 1 MG tablet Take 1 mg by mouth daily.     levothyroxine (SYNTHROID) 88 MCG tablet Take 1 tablet (88 mcg total) by mouth daily before breakfast. 90 tablet 3   rosuvastatin (CRESTOR) 10 MG tablet TAKE 1 TABLET(10 MG) BY MOUTH DAILY 90 tablet 3   No facility-administered medications prior to visit.   No Known Allergies    Objective:   Today's Vitals   02/27/21 1524  BP: 124/68  Pulse: 74  Temp: 98.6 F (37 C)  SpO2: 98%  Weight: 214 lb 3.2 oz (97.2 kg)  Height: 6' (1.829 m)   Body mass index is 29.05 kg/m.   General: Well developed, well nourished. No acute distress. Psych: Alert and oriented x3. Normal mood and affect.  Health Maintenance Due  Topic Date Due   Hepatitis C Screening  Never done   TETANUS/TDAP  Never done   Zoster Vaccines- Shingrix (1 of 2) Never done   PNA vac Low Risk Adult (1 of 2 - PCV13) Never done   COVID-19 Vaccine (4 - Booster for Pfizer series) 12/04/2020   Imaging: IMPRESSION: 1. No CT evidence for acute pulmonary embolus. 2. 8 cm mass lesion appears to arise from segment II of the  left liver and extend cranially to involve the hemidiaphragms at the midline and generate mass-effect on the inferior aspect of the right heart, mainly right ventricle. This could be a primary hepatic neoplasm or metastatic lesion. Abscess considered less likely. Consider abdomen CT with and without contrast plus pelvis CT with contrast to further evaluate. Multiphase liver protocol suggested. 3. Mild lymphadenopathy in the hepatoduodenal ligament. 4. Tiny nonobstructing right renal stones. 5.  Aortic Atherosclerois (ICD10-170.0)    Assessment & Plan:   1. Liver mass I reviewed the  results of his recent CT scan with [name]. I had placed a referral for the CT of the abdomen and pelvis with and without contrast yesterday. This will be performed on July 5th. I answered what questions I could at this point. I will go ahead with a referral to Oncology to expedite his further evaluation. I suspect her may need to see Surgical Oncology, but am not familiar with the local referring physicians, so will leave that to Medical Oncology to help guide.  - Ambulatory referral to Hematology / Oncology  2. Personal history of prostate cancer [name] does have a past history of prostate cancer treated with radiation. This should be part of the consideration in light of the liver mass possibly representing metastatic cancer.  3. Reactive depression It would be natural for someone learning of a significant medical issue to have depression. I suspect this may be triggering underlying depression that was previously present. I will start him on Paxil. I will also provide some Xanax for addressing acute anxiety issues and monitor this.  - PARoxetine (PAXIL) 10 MG tablet; Take 1 tablet (10 mg total) by mouth daily.  Dispense: 30 tablet; Refill: 1 - ALPRAZolam (XANAX) 1 MG tablet; Take 1/2 to 1 tablet up tow twice a day as needed for anxiety.  Dispense: 20 tablet; Refill: 1  Haydee Salter, MD

## 2021-03-03 ENCOUNTER — Telehealth: Payer: Self-pay

## 2021-03-03 ENCOUNTER — Ambulatory Visit
Admission: RE | Admit: 2021-03-03 | Discharge: 2021-03-03 | Disposition: A | Discharge status: Home / Self Care | Payer: Medicare Other | Source: Ambulatory Visit | Attending: Family Medicine | Admitting: Family Medicine

## 2021-03-03 DIAGNOSIS — K750 Abscess of liver: Secondary | ICD-10-CM

## 2021-03-03 DIAGNOSIS — K769 Liver disease, unspecified: Secondary | ICD-10-CM

## 2021-03-03 DIAGNOSIS — R160 Hepatomegaly, not elsewhere classified: Secondary | ICD-10-CM

## 2021-03-03 MED ORDER — IOPAMIDOL (ISOVUE-300) INJECTION 61%
100.0000 mL | Freq: Once | INTRAVENOUS | Status: AC | PRN
  Administered 2021-03-03: 100 mL via INTRAVENOUS

## 2021-03-03 NOTE — Telephone Encounter (Signed)
Patient of Dr. Trixie Dredge, please advise message below

## 2021-03-03 NOTE — Telephone Encounter (Signed)
Stacy from [facility] Radiology gave a call report on this patient.  Radiologist states there appears to be a hepatic abscess.  She can be reached at [phone] for any questions you may have

## 2021-03-04 ENCOUNTER — Emergency Department (HOSPITAL_COMMUNITY): Payer: Medicare Other

## 2021-03-04 ENCOUNTER — Inpatient Hospital Stay (HOSPITAL_COMMUNITY)
Admission: EM | Admit: 2021-03-04 | Discharge: 2021-03-09 | DRG: 442 | Disposition: A | Discharge status: Home Health | Payer: Medicare Other | Attending: Family Medicine | Admitting: Family Medicine

## 2021-03-04 ENCOUNTER — Encounter (HOSPITAL_COMMUNITY): Payer: Self-pay | Admitting: Emergency Medicine

## 2021-03-04 DIAGNOSIS — Z8551 Personal history of malignant neoplasm of bladder: Secondary | ICD-10-CM

## 2021-03-04 DIAGNOSIS — I10 Essential (primary) hypertension: Secondary | ICD-10-CM | POA: Diagnosis present

## 2021-03-04 DIAGNOSIS — K659 Peritonitis, unspecified: Secondary | ICD-10-CM

## 2021-03-04 DIAGNOSIS — R160 Hepatomegaly, not elsewhere classified: Secondary | ICD-10-CM

## 2021-03-04 DIAGNOSIS — R7303 Prediabetes: Secondary | ICD-10-CM | POA: Diagnosis present

## 2021-03-04 DIAGNOSIS — R634 Abnormal weight loss: Secondary | ICD-10-CM | POA: Diagnosis present

## 2021-03-04 DIAGNOSIS — Z923 Personal history of irradiation: Secondary | ICD-10-CM

## 2021-03-04 DIAGNOSIS — Z9079 Acquired absence of other genital organ(s): Secondary | ICD-10-CM

## 2021-03-04 DIAGNOSIS — M1990 Unspecified osteoarthritis, unspecified site: Secondary | ICD-10-CM | POA: Diagnosis present

## 2021-03-04 DIAGNOSIS — Z960 Presence of urogenital implants: Secondary | ICD-10-CM | POA: Diagnosis present

## 2021-03-04 DIAGNOSIS — Z8546 Personal history of malignant neoplasm of prostate: Secondary | ICD-10-CM

## 2021-03-04 DIAGNOSIS — E039 Hypothyroidism, unspecified: Secondary | ICD-10-CM | POA: Diagnosis present

## 2021-03-04 DIAGNOSIS — I700 Atherosclerosis of aorta: Secondary | ICD-10-CM | POA: Diagnosis present

## 2021-03-04 DIAGNOSIS — Z79899 Other long term (current) drug therapy: Secondary | ICD-10-CM

## 2021-03-04 DIAGNOSIS — E785 Hyperlipidemia, unspecified: Secondary | ICD-10-CM | POA: Diagnosis present

## 2021-03-04 DIAGNOSIS — E669 Obesity, unspecified: Secondary | ICD-10-CM | POA: Diagnosis present

## 2021-03-04 DIAGNOSIS — Z20822 Contact with and (suspected) exposure to covid-19: Secondary | ICD-10-CM | POA: Diagnosis present

## 2021-03-04 DIAGNOSIS — E7800 Pure hypercholesterolemia, unspecified: Secondary | ICD-10-CM | POA: Diagnosis present

## 2021-03-04 DIAGNOSIS — F329 Major depressive disorder, single episode, unspecified: Secondary | ICD-10-CM

## 2021-03-04 DIAGNOSIS — M109 Gout, unspecified: Secondary | ICD-10-CM | POA: Diagnosis present

## 2021-03-04 DIAGNOSIS — Z87891 Personal history of nicotine dependence: Secondary | ICD-10-CM

## 2021-03-04 DIAGNOSIS — K5900 Constipation, unspecified: Secondary | ICD-10-CM | POA: Diagnosis present

## 2021-03-04 DIAGNOSIS — K750 Abscess of liver: Secondary | ICD-10-CM | POA: Diagnosis not present

## 2021-03-04 DIAGNOSIS — E440 Moderate protein-calorie malnutrition: Secondary | ICD-10-CM | POA: Diagnosis present

## 2021-03-04 DIAGNOSIS — Z96651 Presence of right artificial knee joint: Secondary | ICD-10-CM | POA: Diagnosis present

## 2021-03-04 DIAGNOSIS — F32A Depression, unspecified: Secondary | ICD-10-CM | POA: Diagnosis present

## 2021-03-04 DIAGNOSIS — Z6828 Body mass index (BMI) 28.0-28.9, adult: Secondary | ICD-10-CM

## 2021-03-04 DIAGNOSIS — Z79890 Hormone replacement therapy (postmenopausal): Secondary | ICD-10-CM

## 2021-03-04 HISTORY — DX: Malignant neoplasm of bladder, unspecified: C67.9

## 2021-03-04 LAB — CBC WITH DIFFERENTIAL/PLATELET
Abs Immature Granulocytes: 0.07 10*3/uL (ref 0.00–0.07)
Basophils Absolute: 0 10*3/uL (ref 0.0–0.1)
Basophils Relative: 0 %
Eosinophils Absolute: 0 10*3/uL (ref 0.0–0.5)
Eosinophils Relative: 0 %
HCT: 34.6 % — ABNORMAL LOW (ref 39.0–52.0)
Hemoglobin: 11 g/dL — ABNORMAL LOW (ref 13.0–17.0)
Immature Granulocytes: 1 %
Lymphocytes Relative: 9 %
Lymphs Abs: 1.1 10*3/uL (ref 0.7–4.0)
MCH: 26.4 pg (ref 26.0–34.0)
MCHC: 31.8 g/dL (ref 30.0–36.0)
MCV: 83.2 fL (ref 80.0–100.0)
Monocytes Absolute: 1 10*3/uL (ref 0.1–1.0)
Monocytes Relative: 8 %
Neutro Abs: 9.6 10*3/uL — ABNORMAL HIGH (ref 1.7–7.7)
Neutrophils Relative %: 82 %
Platelets: 513 10*3/uL — ABNORMAL HIGH (ref 150–400)
RBC: 4.16 MIL/uL — ABNORMAL LOW (ref 4.22–5.81)
RDW: 16.4 % — ABNORMAL HIGH (ref 11.5–15.5)
WBC: 11.8 10*3/uL — ABNORMAL HIGH (ref 4.0–10.5)
nRBC: 0 % (ref 0.0–0.2)

## 2021-03-04 LAB — COMPREHENSIVE METABOLIC PANEL
ALT: 20 U/L (ref 0–44)
AST: 28 U/L (ref 15–41)
Albumin: 2.5 g/dL — ABNORMAL LOW (ref 3.5–5.0)
Alkaline Phosphatase: 107 U/L (ref 38–126)
Anion gap: 10 (ref 5–15)
BUN: 17 mg/dL (ref 8–23)
CO2: 23 mmol/L (ref 22–32)
Calcium: 10.3 mg/dL (ref 8.9–10.3)
Chloride: 98 mmol/L (ref 98–111)
Creatinine, Ser: 1.16 mg/dL (ref 0.61–1.24)
GFR, Estimated: >60 mL/min (ref >60)
Glucose, Bld: 110 mg/dL — ABNORMAL HIGH (ref 70–99)
Potassium: 4.1 mmol/L (ref 3.5–5.1)
Sodium: 131 mmol/L — ABNORMAL LOW (ref 135–145)
Total Bilirubin: 0.7 mg/dL (ref 0.3–1.2)
Total Protein: 7.6 g/dL (ref 6.5–8.1)

## 2021-03-04 NOTE — ED Triage Notes (Signed)
Pt here from home with c/o sob times 5 weeks that has got worse recently , pt has this mass on his liver that he has been refereed to oncology for ,

## 2021-03-04 NOTE — Telephone Encounter (Signed)
Spoke with patient who verbally understood message below patient awaiting appointment to be scheduled for MRI and Oncology.

## 2021-03-04 NOTE — ED Provider Notes (Addendum)
Emergency Medicine Provider Triage Evaluation Note  [name] , a 75 y.o. male  was evaluated in triage.  Pt complains of shortness of breath.  Patient has had shortness of breath over the last 5 weeks.  Shortness of breath has gotten worse in the last few days.  Patient denies any chest pain, hemoptysis, leg swelling, palpitations.  Patient reports that he also has epigastric discomfort.  This has been present for multiple days.  Patient has been following gastroenterology in the outpatient setting.  Patient had CT abdomen pelvis performed yesterday which showed mass to liver.  Patient is to follow-up with heme-onc.  Patient expresses increased anxiety over this finding.  Review of Systems  Positive: Shortness of breath, epigastric pain Negative: chest pain, hemoptysis, leg swelling, palpitations  Physical Exam  BP 123/77 (BP Location: Right Arm)   Pulse 75   Temp 98.5 F (36.9 C) (Oral)   Resp 17   SpO2 100%  Gen:   Awake, no distress   Resp:  Normal effort, patient able speak full difficulty.  Lungs clear to auscultation bilaterally. MSK:   Moves extremities without difficulty.  No swelling, tenderness, or evidence of edema to bilateral lower extremities. Other:  Abdomen soft, nondistended, nontender.  No guarding or tenderness.  Medical Decision Making  Medically screening exam initiated at 6:06 PM.  Appropriate orders placed.  Deren Degrazia was informed that the remainder of the evaluation will be completed by another provider, this initial triage assessment does not replace that evaluation, and the importance of remaining in the ED until their evaluation is complete.  The patient appears stable so that the remainder of the work up may be completed by another provider.    Patient had CTA performed on 6/28 which showed no pulmonary embolism.   Loni Beckwith, PA-C 03/04/21 1808    Loni Beckwith, PA-C 03/04/21 1809    Lorelle Gibbs, DO 03/05/21 0009

## 2021-03-05 ENCOUNTER — Other Ambulatory Visit: Payer: Self-pay

## 2021-03-05 ENCOUNTER — Encounter (HOSPITAL_COMMUNITY): Payer: Self-pay | Admitting: Internal Medicine

## 2021-03-05 ENCOUNTER — Inpatient Hospital Stay (HOSPITAL_COMMUNITY): Payer: Medicare Other

## 2021-03-05 DIAGNOSIS — K5900 Constipation, unspecified: Secondary | ICD-10-CM | POA: Diagnosis present

## 2021-03-05 DIAGNOSIS — I700 Atherosclerosis of aorta: Secondary | ICD-10-CM | POA: Diagnosis present

## 2021-03-05 DIAGNOSIS — R7303 Prediabetes: Secondary | ICD-10-CM | POA: Diagnosis present

## 2021-03-05 DIAGNOSIS — K750 Abscess of liver: Secondary | ICD-10-CM | POA: Diagnosis present

## 2021-03-05 DIAGNOSIS — E039 Hypothyroidism, unspecified: Secondary | ICD-10-CM | POA: Diagnosis present

## 2021-03-05 DIAGNOSIS — R160 Hepatomegaly, not elsewhere classified: Secondary | ICD-10-CM | POA: Diagnosis present

## 2021-03-05 DIAGNOSIS — Z20822 Contact with and (suspected) exposure to covid-19: Secondary | ICD-10-CM | POA: Diagnosis present

## 2021-03-05 DIAGNOSIS — M1990 Unspecified osteoarthritis, unspecified site: Secondary | ICD-10-CM | POA: Diagnosis present

## 2021-03-05 DIAGNOSIS — R634 Abnormal weight loss: Secondary | ICD-10-CM | POA: Diagnosis present

## 2021-03-05 DIAGNOSIS — Z79890 Hormone replacement therapy (postmenopausal): Secondary | ICD-10-CM | POA: Diagnosis not present

## 2021-03-05 DIAGNOSIS — Z6828 Body mass index (BMI) 28.0-28.9, adult: Secondary | ICD-10-CM | POA: Diagnosis not present

## 2021-03-05 DIAGNOSIS — Z96651 Presence of right artificial knee joint: Secondary | ICD-10-CM | POA: Diagnosis present

## 2021-03-05 DIAGNOSIS — Z9079 Acquired absence of other genital organ(s): Secondary | ICD-10-CM | POA: Diagnosis not present

## 2021-03-05 DIAGNOSIS — F32A Depression, unspecified: Secondary | ICD-10-CM | POA: Diagnosis present

## 2021-03-05 DIAGNOSIS — Z960 Presence of urogenital implants: Secondary | ICD-10-CM | POA: Diagnosis present

## 2021-03-05 DIAGNOSIS — E7800 Pure hypercholesterolemia, unspecified: Secondary | ICD-10-CM | POA: Diagnosis present

## 2021-03-05 DIAGNOSIS — Z87891 Personal history of nicotine dependence: Secondary | ICD-10-CM | POA: Diagnosis not present

## 2021-03-05 DIAGNOSIS — E440 Moderate protein-calorie malnutrition: Secondary | ICD-10-CM | POA: Diagnosis present

## 2021-03-05 DIAGNOSIS — Z79899 Other long term (current) drug therapy: Secondary | ICD-10-CM | POA: Diagnosis not present

## 2021-03-05 DIAGNOSIS — E669 Obesity, unspecified: Secondary | ICD-10-CM | POA: Diagnosis present

## 2021-03-05 DIAGNOSIS — Z8546 Personal history of malignant neoplasm of prostate: Secondary | ICD-10-CM | POA: Diagnosis not present

## 2021-03-05 DIAGNOSIS — Z8551 Personal history of malignant neoplasm of bladder: Secondary | ICD-10-CM | POA: Diagnosis not present

## 2021-03-05 DIAGNOSIS — M109 Gout, unspecified: Secondary | ICD-10-CM | POA: Diagnosis present

## 2021-03-05 DIAGNOSIS — I10 Essential (primary) hypertension: Secondary | ICD-10-CM | POA: Diagnosis present

## 2021-03-05 DIAGNOSIS — Z923 Personal history of irradiation: Secondary | ICD-10-CM | POA: Diagnosis not present

## 2021-03-05 LAB — PROTIME-INR
INR: 1.3 — ABNORMAL HIGH (ref 0.8–1.2)
Prothrombin Time: 16.3 seconds — ABNORMAL HIGH (ref 11.4–15.2)

## 2021-03-05 LAB — RESP PANEL BY RT-PCR (FLU A&B, COVID) ARPGX2
Influenza A by PCR: NEGATIVE
Influenza B by PCR: NEGATIVE
SARS Coronavirus 2 by RT PCR: NEGATIVE

## 2021-03-05 MED ORDER — DOCUSATE SODIUM 100 MG PO CAPS
100.0000 mg | ORAL_CAPSULE | Freq: Two times a day (BID) | ORAL | Status: DC
  Administered 2021-03-05 – 2021-03-06 (×4): 100 mg via ORAL
  Filled 2021-03-05 (×5): qty 1

## 2021-03-05 MED ORDER — MORPHINE SULFATE (PF) 2 MG/ML IV SOLN: 2.0000 mg | INTRAVENOUS | Status: DC | PRN

## 2021-03-05 MED ORDER — POLYETHYLENE GLYCOL 3350 17 G PO PACK: 17.0000 g | PACK | Freq: Every day | ORAL | Status: DC | PRN

## 2021-03-05 MED ORDER — PIPERACILLIN-TAZOBACTAM 3.375 G IVPB 30 MIN
3.3750 g | Freq: Once | INTRAVENOUS | Status: AC
  Administered 2021-03-05: 3.375 g via INTRAVENOUS
  Filled 2021-03-05: qty 50

## 2021-03-05 MED ORDER — ONDANSETRON HCL 4 MG PO TABS: 4.0000 mg | ORAL_TABLET | Freq: Four times a day (QID) | ORAL | Status: DC | PRN

## 2021-03-05 MED ORDER — HYDROCODONE-ACETAMINOPHEN 5-325 MG PO TABS
1.0000 | ORAL_TABLET | ORAL | Status: DC | PRN
  Filled 2021-03-05: qty 2

## 2021-03-05 MED ORDER — ALPRAZOLAM 0.25 MG PO TABS: 0.5000 mg | ORAL_TABLET | Freq: Two times a day (BID) | ORAL | Status: DC | PRN

## 2021-03-05 MED ORDER — ONDANSETRON HCL 4 MG/2ML IJ SOLN: 4.0000 mg | Freq: Four times a day (QID) | INTRAMUSCULAR | Status: DC | PRN

## 2021-03-05 MED ORDER — HYDRALAZINE HCL 20 MG/ML IJ SOLN: 5.0000 mg | INTRAMUSCULAR | Status: DC | PRN

## 2021-03-05 MED ORDER — FENTANYL CITRATE (PF) 100 MCG/2ML IJ SOLN
INTRAMUSCULAR | Status: AC
  Filled 2021-03-05: qty 2

## 2021-03-05 MED ORDER — LIDOCAINE HCL (PF) 1 % IJ SOLN
INTRAMUSCULAR | Status: AC
  Filled 2021-03-05: qty 30

## 2021-03-05 MED ORDER — LEVOTHYROXINE SODIUM 88 MCG PO TABS
88.0000 ug | ORAL_TABLET | Freq: Every day | ORAL | Status: DC
  Administered 2021-03-06 – 2021-03-09 (×4): 88 ug via ORAL
  Filled 2021-03-05 (×4): qty 1

## 2021-03-05 MED ORDER — PIPERACILLIN-TAZOBACTAM 3.375 G IVPB
3.3750 g | Freq: Three times a day (TID) | INTRAVENOUS | Status: DC
  Administered 2021-03-05 – 2021-03-08 (×8): 3.375 g via INTRAVENOUS
  Filled 2021-03-05 (×9): qty 50

## 2021-03-05 MED ORDER — PAROXETINE HCL 10 MG PO TABS
10.0000 mg | ORAL_TABLET | Freq: Every day | ORAL | Status: DC
  Administered 2021-03-07: 10 mg via ORAL
  Filled 2021-03-05 (×2): qty 1

## 2021-03-05 MED ORDER — CARVEDILOL 12.5 MG PO TABS
25.0000 mg | ORAL_TABLET | Freq: Every day | ORAL | Status: DC
  Administered 2021-03-05: 25 mg via ORAL
  Filled 2021-03-05: qty 2

## 2021-03-05 MED ORDER — ACETAMINOPHEN 325 MG PO TABS
650.0000 mg | ORAL_TABLET | Freq: Four times a day (QID) | ORAL | Status: DC | PRN
  Administered 2021-03-05 (×2): 650 mg via ORAL
  Filled 2021-03-05 (×2): qty 2

## 2021-03-05 MED ORDER — MIDAZOLAM HCL 2 MG/2ML IJ SOLN
INTRAMUSCULAR | Status: AC
  Filled 2021-03-05: qty 2

## 2021-03-05 MED ORDER — ROSUVASTATIN CALCIUM 5 MG PO TABS
10.0000 mg | ORAL_TABLET | Freq: Every day | ORAL | Status: DC
  Administered 2021-03-06 – 2021-03-09 (×4): 10 mg via ORAL
  Filled 2021-03-05 (×4): qty 2

## 2021-03-05 MED ORDER — FENTANYL CITRATE (PF) 100 MCG/2ML IJ SOLN
INTRAMUSCULAR | Status: AC | PRN
  Administered 2021-03-05 (×2): 25 ug via INTRAVENOUS

## 2021-03-05 MED ORDER — CARVEDILOL 25 MG PO TABS
25.0000 mg | ORAL_TABLET | Freq: Two times a day (BID) | ORAL | Status: DC
  Administered 2021-03-05 – 2021-03-09 (×8): 25 mg via ORAL
  Filled 2021-03-05 (×8): qty 1

## 2021-03-05 MED ORDER — ACETAMINOPHEN 650 MG RE SUPP: 650.0000 mg | Freq: Four times a day (QID) | RECTAL | Status: DC | PRN

## 2021-03-05 MED ORDER — BISACODYL 5 MG PO TBEC: 5.0000 mg | DELAYED_RELEASE_TABLET | Freq: Every day | ORAL | Status: DC | PRN

## 2021-03-05 MED ORDER — LEVOTHYROXINE SODIUM 88 MCG PO TABS
88.0000 ug | ORAL_TABLET | Freq: Every day | ORAL | Status: DC
  Administered 2021-03-05: 88 ug via ORAL
  Filled 2021-03-05: qty 1

## 2021-03-05 MED ORDER — LACTATED RINGERS IV SOLN: INTRAVENOUS | Status: DC

## 2021-03-05 MED ORDER — ALLOPURINOL 100 MG PO TABS
100.0000 mg | ORAL_TABLET | Freq: Two times a day (BID) | ORAL | Status: DC
  Administered 2021-03-05 – 2021-03-09 (×8): 100 mg via ORAL
  Filled 2021-03-05 (×8): qty 1

## 2021-03-05 MED ORDER — MIDAZOLAM HCL 2 MG/2ML IJ SOLN
INTRAMUSCULAR | Status: AC | PRN
  Administered 2021-03-05: 0.5 mg via INTRAVENOUS
  Administered 2021-03-05: 1 mg via INTRAVENOUS

## 2021-03-05 NOTE — ED Notes (Signed)
Attempted to call report a x2

## 2021-03-05 NOTE — ED Notes (Signed)
Attempt to call report to unit, nurse unavailable at this time, message left for return call.

## 2021-03-05 NOTE — Procedures (Signed)
Interventional Radiology Procedure Note  Procedure: Image guided drain placement, hepatic abscess.  84F pigtail drain.  Complications: None  EBL: None Sample: Culture sent, cytology sent  Recommendations: - Routine drain care, with sterile flushes, record output - follow up Cx, cytology - routine wound care  Signed,  Dulcy Fanny. Earleen Newport, DO

## 2021-03-05 NOTE — ED Provider Notes (Signed)
[facility] EMERGENCY DEPARTMENT Provider Note   CSN: [id number] Arrival date & time: 03/04/21  1654     History CC:  Abdominal pain   [name] is a 75 y.o. male with a history of hypothyroidism, hypertension, high cholesterol, liver mass, presented emergency department with shortness of breath and constipation.  The patient reports that he was recently diagnosed with a large liver mass noted incidentally on coronary CT scan on 02/25/21, then in more detail on a dedicated abdominal CT 2 days ago.  Per medical records this appears to be a mass measuring 8.5 x 8.4 on the left lobe of the liver, rim enhancing.  The remainder of the CT scan was unremarkable, with the exception of prominent lymph node swelling diffusely in the abdomen.  Overall the findings were concerning for malignancy.  The patient has not yet been able to see an oncologist.  He reports that his last bowel movement was 3 days ago, and it was normal at that time.  He reports poor appetite.  He reports he has had dyspnea for the past 2 months.  He was seen in the emergency department in May for general fatigue, had a work-up including blood cultures which were negative, UA which was equivocal for infection.  He was started on Keflex at that time.  HPI     Past Medical History:  Diagnosis Date   Arthritis    knees. back   Bladder cancer ([place])    2018   High cholesterol    Hypertension    Hypothyroidism    prostate cancer    2000, 2003   Thyroid disease     Patient Active Problem List   Diagnosis Date Noted   Liver mass 03/05/2021   Nephrolithiasis 02/27/2021   Aortic atherosclerosis ([place]) 02/27/2021   Abscess of liver 02/25/2021   Prediabetes 11/18/2020   OA (osteoarthritis) of knee 08/11/2020   Primary osteoarthritis of right knee 08/11/2020   Essential hypertension 01/11/2020   Onychomycosis 10/12/2019   Hypothyroidism 10/12/2019   Hyperlipidemia 10/12/2019   Radiation cystitis  09/07/2019   Thrombocytopenia ([place]) 05/03/2019   Gout 05/03/2019   Screen for colon cancer 05/03/2019   Personal history of prostate cancer 05/03/2019   Obesity (BMI 30.0-34.9) 04/26/2019   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    Past Surgical History:  Procedure Laterality Date   EYE SURGERY Bilateral 2014   HERNIA REPAIR Right 1984   inguinal   KNEE ARTHROSCOPY Left 1966   PENILE PROSTHESIS IMPLANT  2014   TOTAL KNEE ARTHROPLASTY Right 08/11/2020   Procedure: TOTAL KNEE ARTHROPLASTY;  Surgeon: Gaynelle Arabian, MD;  Location: [facility];  Service: Orthopedics;  Laterality: Right;  4min   TRANSURETHRAL RESECTION OF BLADDER TUMOR  2018       Family History  Problem Relation Age of Onset   Cancer Neg Hx     Social History   Tobacco Use   Smoking status: Never   Smokeless tobacco: Never  Vaping Use   Vaping Use: Former   Start date: 08/01/2017   Substances: THC  Substance Use Topics   Alcohol use: Yes    Alcohol/week: 2.0 standard drinks    Types: 2 Standard drinks or equivalent per week   Drug use: Not Currently    Types: Marijuana    Comment: none in at least 1 year    Home Medications Prior to Admission medications   Medication Sig Start Date End Date Taking? Authorizing Provider  allopurinol (ZYLOPRIM)  100 MG tablet TAKE 1 TABLET(100 MG) BY MOUTH TWICE DAILY 10/31/20   Haydee Salter, MD  ALPRAZolam Duanne Moron) 1 MG tablet Take 1/2 to 1 tablet up tow twice a day as needed for anxiety. 02/27/21   Haydee Salter, MD  carvedilol (COREG) 25 MG tablet TAKE 1 TABLET(25 MG) BY MOUTH TWICE DAILY 07/16/20   Dutch Quint B, FNP  co-enzyme Q-10 30 MG capsule Take 30 mg by mouth daily.    [provider]  folic acid (FOLVITE) 1 MG tablet Take 1 mg by mouth daily.    [provider]  levothyroxine (SYNTHROID) 88 MCG tablet Take 1 tablet (88 mcg total) by mouth daily before breakfast. 11/18/20   Haydee Salter, MD  PARoxetine (PAXIL) 10 MG  tablet Take 1 tablet (10 mg total) by mouth daily. 02/27/21   Haydee Salter, MD  rosuvastatin (CRESTOR) 10 MG tablet TAKE 1 TABLET(10 MG) BY MOUTH DAILY 05/01/20   Libby Maw, MD    Allergies    Patient has no known allergies.  Review of Systems   Review of Systems  Constitutional:  Positive for fatigue. Negative for chills and fever.  HENT:  Negative for ear pain and sore throat.   Eyes:  Negative for pain and visual disturbance.  Respiratory:  Positive for shortness of breath. Negative for cough.   Cardiovascular:  Negative for chest pain and palpitations.  Gastrointestinal:  Positive for abdominal pain and constipation. Negative for vomiting.  Genitourinary:  Negative for dysuria and hematuria.  Musculoskeletal:  Negative for arthralgias and back pain.  Skin:  Negative for color change and rash.  Neurological:  Negative for syncope and headaches.  All other systems reviewed and are negative.  Physical Exam Updated Vital Signs BP 110/72   Pulse 60   Temp 98.2 F (36.8 C) (Oral)   Resp 17   SpO2 97%   Physical Exam Constitutional:      General: He is not in acute distress. HENT:     Head: Normocephalic and atraumatic.  Eyes:     Conjunctiva/sclera: Conjunctivae normal.     Pupils: Pupils are equal, round, and reactive to light.  Cardiovascular:     Rate and Rhythm: Normal rate and regular rhythm.  Pulmonary:     Effort: Pulmonary effort is normal. No respiratory distress.  Abdominal:     General: There is no distension.     Tenderness: There is no abdominal tenderness.  Skin:    General: Skin is warm and dry.  Neurological:     General: No focal deficit present.     Mental Status: He is alert. Mental status is at baseline.  Psychiatric:        Mood and Affect: Mood normal.        Behavior: Behavior normal.    ED Results / Procedures / Treatments   Labs (all labs ordered are listed, but only abnormal results are displayed) Labs Reviewed   COMPREHENSIVE METABOLIC PANEL - Abnormal; Notable for the following components:      Result Value   Sodium 131 (*)    Glucose, Bld 110 (*)    Albumin 2.5 (*)    All other components within normal limits  CBC WITH DIFFERENTIAL/PLATELET - Abnormal; Notable for the following components:   WBC 11.8 (*)    RBC 4.16 (*)    Hemoglobin 11.0 (*)    HCT 34.6 (*)    RDW 16.4 (*)    Platelets 513 (*)  Neutro Abs 9.6 (*)    All other components within normal limits  RESP PANEL BY RT-PCR (FLU A&B, COVID) ARPGX2  CULTURE, BLOOD (ROUTINE X 2)  CULTURE, BLOOD (ROUTINE X 2)  AFP TUMOR MARKER  CEA  CANCER ANTIGEN 19-9  PROTIME-INR    EKG None  Radiology CT ABDOMEN PELVIS W WO CONTRAST  Result Date: 03/03/2021 CLINICAL DATA:  Liver mass identified by prior CT chest angiogram EXAM: CT ABDOMEN AND PELVIS WITHOUT AND WITH CONTRAST TECHNIQUE: Multidetector CT imaging of the abdomen and pelvis was performed following the standard protocol before and following the bolus administration of intravenous contrast. CONTRAST:  158mL ISOVUE-300 IOPAMIDOL (ISOVUE-300) INJECTION 61% COMPARISON:  None. FINDINGS: Lower chest: No acute abnormality. Hepatobiliary: There is a large, rim enhancing mass with near fluid internal attenuation arising from the left lobe of the liver and involving or crossing the diaphragm, very closely abutting the right ventricle (series 15, image 56), measuring 8.5 x 8.4 cm, slightly increased in size compared to recent prior examination, at which time it measured 8.1 x 7.9 cm when measured similarly (series 4, image 35, series 17, image 63). There is a component that appears to extend beyond the liver capsule into the gastrohepatic ligament, tenting the adjacent lesser curvature of the stomach (series 4, image 55). No gallstones, gallbladder wall thickening, or biliary dilatation. Pancreas: Unremarkable. No pancreatic ductal dilatation or surrounding inflammatory changes. Spleen: Normal in  size without significant abnormality. Adrenals/Urinary Tract: Adrenal glands are unremarkable. Multiple small bilateral nonobstructive renal calculi. Thickening of the decompressed urinary bladder wall. Stomach/Bowel: Stomach is within normal limits. Appendix appears normal. No evidence of bowel wall thickening, distention, or inflammatory changes. Descending and sigmoid diverticula. Vascular/Lymphatic: Aortic atherosclerosis. Prominent celiac axis, portacaval, and retroperitoneal lymph nodes. Reproductive: Status post prostatectomy. Penile implant and left lower quadrant reservoir. Other: No abdominal wall hernia or abnormality. No abdominopelvic ascites. Musculoskeletal: No acute or significant osseous findings. IMPRESSION: 1. There is a large, rim enhancing mass with near fluid internal attenuation arising from the left lobe of the liver and involving or crossing the diaphragm and very closely abutting the right ventricle, measuring 8.5 x 8.4 cm, slightly increased in size compared to recent prior examination. There is a component that appears to extend beyond the liver capsule into the gastrohepatic ligament, tenting the adjacent lesser curvature of the stomach. Findings are most suggestive of hepatic abscess. Malignancy with internal necrosis is difficult to exclude. 2. Prominent celiac axis, portacaval, and retroperitoneal lymph nodes, nonspecific and possibly reactive. 3. Status post prostatectomy. Penile implant and left lower quadrant reservoir. 4. Nonobstructive bilateral nephrolithiasis. These results will be called to the ordering clinician or representative by the Radiologist Assistant, and communication documented in the PACS or [employer]. Aortic Atherosclerosis (ICD10-I70.0). Electronically Signed   By: Eddie Candle M.D.   On: 03/03/2021 16:18   DG Chest 2 View  Result Date: 03/04/2021 CLINICAL DATA:  Shortness of breath EXAM: CHEST - 2 VIEW COMPARISON:  01/06/2021 FINDINGS: The heart size  and mediastinal contours are within normal limits. Both lungs are clear. The visualized skeletal structures are unremarkable. IMPRESSION: No active cardiopulmonary disease. Electronically Signed   By: Ulyses Jarred M.D.   On: 03/04/2021 19:13    Procedures Procedures   Medications Ordered in ED Medications  piperacillin-tazobactam (ZOSYN) IVPB 3.375 g (0 g Intravenous Stopped 03/05/21 1031)    Followed by  piperacillin-tazobactam (ZOSYN) IVPB 3.375 g (has no administration in time range)  allopurinol (ZYLOPRIM) tablet 100 mg (has  no administration in time range)  carvedilol (COREG) tablet 25 mg (has no administration in time range)  rosuvastatin (CRESTOR) tablet 10 mg (has no administration in time range)  ALPRAZolam (XANAX) tablet 0.5-1 mg (has no administration in time range)  PARoxetine (PAXIL) tablet 10 mg (has no administration in time range)  levothyroxine (SYNTHROID) tablet 88 mcg (has no administration in time range)  lactated ringers infusion (has no administration in time range)  acetaminophen (TYLENOL) tablet 650 mg (has no administration in time range)    Or  acetaminophen (TYLENOL) suppository 650 mg (has no administration in time range)  HYDROcodone-acetaminophen (NORCO/VICODIN) 5-325 MG per tablet 1-2 tablet (has no administration in time range)  morphine 2 MG/ML injection 2 mg (has no administration in time range)  docusate sodium (COLACE) capsule 100 mg (100 mg Oral Given 03/05/21 1039)  polyethylene glycol (MIRALAX / GLYCOLAX) packet 17 g (has no administration in time range)  bisacodyl (DULCOLAX) EC tablet 5 mg (has no administration in time range)  ondansetron (ZOFRAN) tablet 4 mg (has no administration in time range)    Or  ondansetron (ZOFRAN) injection 4 mg (has no administration in time range)  hydrALAZINE (APRESOLINE) injection 5 mg (has no administration in time range)    ED Course  I have reviewed the triage vital signs and the nursing notes.  Pertinent labs &  imaging results that were available during my care of the patient were reviewed by me and considered in my medical decision making (see chart for details).  Epigastric discomfort, malaise, GI issues in the setting of a large liver lesion.  I suspect his symptoms are likely related to this collection, which may be an abscess.  He appears to have reactive lymph nodes and a CTA which was reviewed from 2 days ago.  Malignancy is also on the differential.  I reviewed his labs today which were largely unremarkable.  CMP is near baseline.  Liver function is overall normal.  He has mild leukocytosis white blood cell count of 11.8.  But he otherwise does not meet sepsis criteria.  Unfortunately the patient did have a very prolonged wait in the waiting room overnight, approximately 12 hours, after his medical screening exam in triage.  This was prior to my evaluation in the morning.  However his vital signs remained stable, he remained afebrile overnight.  His chest x-ray was unremarkable.  I reviewed his EKG which showed no acute ischemic findings.  I spoke to oncology as well as interventional radiology as noted in ED course below.  We will keep the patient n.p.o. and admit him to the hospitalist for ultrasound-guided drainage of this abscess.  I have ordered IV Zosyn for empiric coverage for intra-abdominal abscess or infection.  Clinical Course as of 03/05/21 1059  Thu Mar 05, 2021  [id number] Patient admitted to Dr Lorin Mercy hospitalist.  I spoke to IR DR Earleen Newport who anticipates a possible diagnostic US-guided drainage this afternoon.  Patient to be kept NPO.  I also spoke to Dr Lorenso Courier from oncology who advised tumor markers be ordered, and their team can be consulted as needed if the drainage results are concerning for malignancy.   [MT]  [phone] The patient and his daughter, who is an EM PA in [place], were both updated regarding the plan for admission and treatment, and both in agreement with the plan. [MT]  [phone]  Although he does not meet sepsis criteria, with the potential of an abscess in the abdomen, I have ordered antibiotics with  IV Zosyn as well as a set of blood cultures. [MT]    Clinical Course User Index [MT] Revel Stellmach, Carola Rhine, MD    Final Clinical Impression(s) / ED Diagnoses Final diagnoses:  Liver mass  Abdominal infection [facility])    Rx / DC Orders ED Discharge Orders     None        Wyvonnia Dusky, MD 03/05/21 1100

## 2021-03-05 NOTE — Plan of Care (Signed)
  Problem: Education: Goal: Knowledge of General Education information will improve Description: Including pain rating scale, medication(s)/side effects and non-pharmacologic comfort measures Outcome: Progressing   Problem: Activity: Goal: Risk for activity intolerance will decrease Outcome: Progressing   Problem: Nutrition: Goal: Adequate nutrition will be maintained Outcome: Progressing   Problem: Elimination: Goal: Will not experience complications related to bowel motility Outcome: Progressing   Problem: Safety: Goal: Ability to remain free from injury will improve Outcome: Progressing   Problem: Skin Integrity: Goal: Risk for impaired skin integrity will decrease Outcome: Progressing

## 2021-03-05 NOTE — H&P (Signed)
Chief Complaint: Patient was seen in consultation today for  image guided aspiration and possible drain placement for a hepatic abscess at the request of Dr. Langston Masker  Referring Physician(s): Dr. Langston Masker   Supervising Physician: Corrie Mckusick  Patient Status: [name]  History of Present Illness: [name] is a 75 y.o. male with past medical history of HTN, hypothyroidism, arthritis, prostate cancer s/p radiation therapy who presented to [facility] ED 03/04/21 with chief complaint of shortness of breath and epigastric discomfort.   Of note, patient has been followed by cardiology due to dyspnea on exertion, underwent CT scans on 02/25/2021 which showed an incidental finding of 8 cm hepatic mass. Subsequent CT abdomen on 03/03/21 showed findings are most suggestive of hepatic abscess, malignancy with internal necrosis is difficult to exclude.  Patient was referred to oncology for further evaluation and management of the hepatic lesion, however, patient presented to ED on 03/04/2021 due to worsening of shortness of breath and epigastric discomfort.  Patient is currently being admitted for further evaluation of shortness of breath and epigastric pain.   IR was requested for image guided aspiration and possible drain placement for the hepatic lesion.   Patient laying in bed, not in acute distress.  His wife, lab, and MD at the bedside. Reports slightly worsening of chronic shortness of breath today, denies chest pain or cough. Also denise headache, fever, chills, abdominal pain, nausea ,vomiting, and bleeding.   Past Medical History:  Diagnosis Date   Arthritis    knees. back   Cancer ([place]) 2018   High cholesterol    Hypertension    Hypothyroidism    Thyroid disease     Past Surgical History:  Procedure Laterality Date   EYE SURGERY Bilateral 2014   HERNIA REPAIR Right 1984   inguinal   KNEE ARTHROSCOPY Left 1966   PENILE PROSTHESIS IMPLANT  2014   TOTAL KNEE ARTHROPLASTY Right 08/11/2020    Procedure: TOTAL KNEE ARTHROPLASTY;  Surgeon: Gaynelle Arabian, MD;  Location: [facility] ORS;  Service: Orthopedics;  Laterality: Right;  29min   TRANSURETHRAL RESECTION OF BLADDER TUMOR  2018    Allergies: Patient has no known allergies.  Medications: Prior to Admission medications   Medication Sig Start Date End Date Taking? Authorizing Provider  allopurinol (ZYLOPRIM) 100 MG tablet TAKE 1 TABLET(100 MG) BY MOUTH TWICE DAILY 10/31/20   Haydee Salter, MD  ALPRAZolam Duanne Moron) 1 MG tablet Take 1/2 to 1 tablet up tow twice a day as needed for anxiety. 02/27/21   Haydee Salter, MD  carvedilol (COREG) 25 MG tablet TAKE 1 TABLET(25 MG) BY MOUTH TWICE DAILY 07/16/20   Dutch Quint B, FNP  co-enzyme Q-10 30 MG capsule Take 30 mg by mouth daily.    [provider]  folic acid (FOLVITE) 1 MG tablet Take 1 mg by mouth daily.    [provider]  levothyroxine (SYNTHROID) 88 MCG tablet Take 1 tablet (88 mcg total) by mouth daily before breakfast. 11/18/20   Haydee Salter, MD  PARoxetine (PAXIL) 10 MG tablet Take 1 tablet (10 mg total) by mouth daily. 02/27/21   Haydee Salter, MD  rosuvastatin (CRESTOR) 10 MG tablet TAKE 1 TABLET(10 MG) BY MOUTH DAILY 05/01/20   Libby Maw, MD     History reviewed. No pertinent family history.  Social History   Socioeconomic History   Marital status: Married    Spouse name: Not on file   Number of children: Not on file  Years of education: Not on file   Highest education level: Not on file  Occupational History   Not on file  Tobacco Use   Smoking status: Never   Smokeless tobacco: Never  Vaping Use   Vaping Use: Former   Start date: 08/01/2017   Substances: THC  Substance and Sexual Activity   Alcohol use: Yes    Alcohol/week: 2.0 standard drinks    Types: 2 Standard drinks or equivalent per week   Drug use: Not Currently    Types: Marijuana   Sexual activity: Yes  Other Topics Concern   Not on file  Social History Narrative    Not on file   Social Determinants of Health   Financial Resource Strain: Not on file  Food Insecurity: Not on file  Transportation Needs: Not on file  Physical Activity: Not on file  Stress: Not on file  Social Connections: Not on file     Review of Systems: A 12 point ROS discussed and pertinent positives are indicated in the HPI above.  All other systems are negative.   Vital Signs: BP 107/66   Pulse 65   Temp 98.2 F (36.8 C) (Oral)   Resp 17   SpO2 99%   Physical Exam  Vitals and nursing note reviewed.  Constitutional:      General: He is not in acute distress.    Appearance: Normal appearance.  HENT:     Head: Normocephalic and atraumatic.     Mouth/Throat:     Mouth: Mucous membranes are moist.     Pharynx: Oropharynx is clear.  Cardiovascular:     Rate and Rhythm: Normal rate and regular rhythm.     Pulses: Normal pulses.     Heart sounds: Normal heart sounds.  Pulmonary:     Effort: Pulmonary effort is normal.     Breath sounds: Normal breath sounds. No wheezing, rhonchi or rales.  Abdominal:     General: Bowel sounds are normal. There is no distension.     Palpations: Abdomen is soft.  Skin:    General: Skin is warm and dry.  Neurological:     Mental Status: He is alert and oriented to person, place, and time.  Psychiatric:        Mood and Affect: Mood normal.        Behavior: Behavior normal.    MD Evaluation Airway: WNL Heart: WNL Abdomen: WNL Chest/ Lungs: WNL ASA  Classification: 3 Mallampati/Airway Score: Two  Imaging: CT ABDOMEN PELVIS W WO CONTRAST  Result Date: 03/03/2021 CLINICAL DATA:  Liver mass identified by prior CT chest angiogram EXAM: CT ABDOMEN AND PELVIS WITHOUT AND WITH CONTRAST TECHNIQUE: Multidetector CT imaging of the abdomen and pelvis was performed following the standard protocol before and following the bolus administration of intravenous contrast. CONTRAST:  181mL ISOVUE-300 IOPAMIDOL (ISOVUE-300) INJECTION 61%  COMPARISON:  None. FINDINGS: Lower chest: No acute abnormality. Hepatobiliary: There is a large, rim enhancing mass with near fluid internal attenuation arising from the left lobe of the liver and involving or crossing the diaphragm, very closely abutting the right ventricle (series 15, image 56), measuring 8.5 x 8.4 cm, slightly increased in size compared to recent prior examination, at which time it measured 8.1 x 7.9 cm when measured similarly (series 4, image 35, series 17, image 63). There is a component that appears to extend beyond the liver capsule into the gastrohepatic ligament, tenting the adjacent lesser curvature of the stomach (series 4, image 55). No gallstones,  gallbladder wall thickening, or biliary dilatation. Pancreas: Unremarkable. No pancreatic ductal dilatation or surrounding inflammatory changes. Spleen: Normal in size without significant abnormality. Adrenals/Urinary Tract: Adrenal glands are unremarkable. Multiple small bilateral nonobstructive renal calculi. Thickening of the decompressed urinary bladder wall. Stomach/Bowel: Stomach is within normal limits. Appendix appears normal. No evidence of bowel wall thickening, distention, or inflammatory changes. Descending and sigmoid diverticula. Vascular/Lymphatic: Aortic atherosclerosis. Prominent celiac axis, portacaval, and retroperitoneal lymph nodes. Reproductive: Status post prostatectomy. Penile implant and left lower quadrant reservoir. Other: No abdominal wall hernia or abnormality. No abdominopelvic ascites. Musculoskeletal: No acute or significant osseous findings. IMPRESSION: 1. There is a large, rim enhancing mass with near fluid internal attenuation arising from the left lobe of the liver and involving or crossing the diaphragm and very closely abutting the right ventricle, measuring 8.5 x 8.4 cm, slightly increased in size compared to recent prior examination. There is a component that appears to extend beyond the liver capsule  into the gastrohepatic ligament, tenting the adjacent lesser curvature of the stomach. Findings are most suggestive of hepatic abscess. Malignancy with internal necrosis is difficult to exclude. 2. Prominent celiac axis, portacaval, and retroperitoneal lymph nodes, nonspecific and possibly reactive. 3. Status post prostatectomy. Penile implant and left lower quadrant reservoir. 4. Nonobstructive bilateral nephrolithiasis. These results will be called to the ordering clinician or representative by the Radiologist Assistant, and communication documented in the PACS or [employer]. Aortic Atherosclerosis (ICD10-I70.0). Electronically Signed   By: Eddie Candle M.D.   On: 03/03/2021 16:18   DG Chest 2 View  Result Date: 03/04/2021 CLINICAL DATA:  Shortness of breath EXAM: CHEST - 2 VIEW COMPARISON:  01/06/2021 FINDINGS: The heart size and mediastinal contours are within normal limits. Both lungs are clear. The visualized skeletal structures are unremarkable. IMPRESSION: No active cardiopulmonary disease. Electronically Signed   By: Ulyses Jarred M.D.   On: 03/04/2021 19:13   CT Angio Chest Pulmonary Embolism (PE) W or WO Contrast  Result Date: 02/25/2021 CLINICAL DATA:  Clinical concern for pulmonary embolus. EXAM: CT ANGIOGRAPHY CHEST WITH CONTRAST TECHNIQUE: Multidetector CT imaging of the chest was performed using the standard protocol during bolus administration of intravenous contrast. Multiplanar CT image reconstructions and MIPs were obtained to evaluate the vascular anatomy. CONTRAST:  163mL OMNIPAQUE IOHEXOL 350 MG/ML SOLN COMPARISON:  None. FINDINGS: Cardiovascular: Mass lesion identified along the inferior and anterior right heart. Coronary artery calcification is evident. Atherosclerotic calcification is noted in the wall of the thoracic aorta. There is no filling defect within the opacified pulmonary arteries to suggest the presence of an acute pulmonary embolus. Mediastinum/Nodes: No mediastinal  lymphadenopathy. There is no hilar lymphadenopathy. The esophagus has normal imaging features. There is no axillary lymphadenopathy. Lungs/Pleura: No suspicious pulmonary nodule or mass. No focal airspace consolidation. No pleural effusion. Upper Abdomen: 7.9 x 6.7 x 7.9 cm low-density mass lesion is identified in the anterior central abdomen involving the anterior aspect of each hemidiaphragm in generating mass-effect on the inferior heart. Given relative sparing of fat planes between the inferior wall of the right heart in the lesion, this is not felt to be cardiac origin but probably arises from segment II of the left liver. Small stones are seen in the upper pole right kidney. 16 mm short axis hepatoduodenal ligament lymph node is seen on 01/30 8/5. Musculoskeletal: No worrisome lytic or sclerotic osseous abnormality. Review of the MIP images confirms the above findings. IMPRESSION: 1. No CT evidence for acute pulmonary embolus. 2. 8 cm  mass lesion appears to arise from segment II of the left liver and extend cranially to involve the hemidiaphragms at the midline and generate mass-effect on the inferior aspect of the right heart, mainly right ventricle. This could be a primary hepatic neoplasm or metastatic lesion. Abscess considered less likely. Consider abdomen CT with and without contrast plus pelvis CT with contrast to further evaluate. Multiphase liver protocol suggested. 3. Mild lymphadenopathy in the hepatoduodenal ligament. 4. Tiny nonobstructing right renal stones. 5.  Aortic Atherosclerois (ICD10-170.0) These results will be called to the ordering clinician or representative by the Radiologist Assistant, and communication documented in the PACS or [employer]. Electronically Signed   By: Misty Stanley M.D.   On: 02/25/2021 10:59   CT CORONARY MORPH W/CTA COR W/SCORE W/CA W/CM &/OR WO/CM  Addendum Date: 02/25/2021   ADDENDUM REPORT: 02/25/2021 13:21 CLINICAL DATA:  75 Year-old White Male EXAM:  Cardiac/Coronary  CTA TECHNIQUE: The patient was scanned on a [employer]. FINDINGS: A 100 kV prospective scan was triggered in the descending thoracic aorta at 111 HU's. Axial non-contrast 3 mm slices were carried out through the heart. The data set was analyzed on a dedicated work station and scored using the [place]. Gantry rotation speed was 250 msecs and collimation was .6 mm. No beta blockade and 0.8 mg of sl NTG was given. The 3D data set was reconstructed in 5% intervals of the 67-82 % of the R-R cycle. Diastolic phases were analyzed on a dedicated work station using MPR, MIP and VRT modes. The patient received 150 cc of contrast. Aorta:  Normal size.  Aortic atherosclerosis noted.  No dissection. Main Pulmonary Artery: Normal size of the pulmonary artery. Aortic Valve: Tri-leaflet. Aortic valve calcium score of 352 (non-severe). Coronary Arteries:  Normal coronary origin.  Right dominance. Coronary Calcium Score: Left main: 25 Left anterior descending artery: 191 Left circumflex artery: 161 Right coronary artery: 101 Total: 478 Percentile: 63rd for age, sex, and race matched control. RCA is a large dominant artery that gives rise to PDA and PLA. There is a mild non-obstructive (25-49%) and a minimal non obstructive (1-24%) calcified plaque in the proximal RCA. There are minimal non obstructive (1-24%) calcified plaques in the mid RCA. There is a mild non-obstructive (25-49%) small vessel calcified plaque in the distal tip of the RCA. Left main is a large artery that gives rise to LAD and LCX arteries. There is a < 10% calcified plaque in the proximal vessel. LAD is a large vessel that gives rise to two diagonal vessels. There is a minimal non obstructive (1-24%) calcified plaque in the proximal LAD. There is a mild non-obstructive (25-49%) calcified plaque in the mid LAD, followed by a minimal non obstructive (1-24%) calcified plaque. There are minimal non obstructive (1-24%) calcified  plaques in the distal LAD LCX is a non-dominant artery that gives rise to one and OM1 vessel and an OM2 branch. There is a mild non-obstructive (25-49%) calcified plaque in the proximal LCX. There is a mild non-obstructive (25-49%) calcified plaque in the OM2 vessel. Other findings: Normal variant pulmonary vein drainage into the left atrium: Left common pulmonary vein. Normal left atrial appendage without a thrombus. There is a 79 X 62 X 79 mm low density mass adjacent to the inferior portion of the right ventricle. No evidence intracardiac communication. Hounsfield Units of 45 are less consistent with a cystic origin. Extra-cardiac findings: See attached radiology report for non-cardiac structures. IMPRESSION: 1. Coronary calcium score of  16. This was 63rd percentile for age, sex, and race matched control. 2. Normal coronary origin with right dominance. 3. Aortic atherosclerosis noted. 4. Aortic valve calcium score of 352 (non-severe). 5. CAD-RADS 2. Mild non-obstructive CAD (25-49%). Consider non-atherosclerotic causes of chest pain. Consider preventive therapy and risk factor modification. 6. There is a 79 X 62 X 79 mm low density mass adjacent to the inferior portion of the right ventricle. No evidence intracardiac communication. Hounsfield Units of 45 are less consistent with a cystic origin. See radiology report for more detail. Discussed with primary cardiologist. RECOMMENDATIONS: Coronary artery calcium (CAC) score is a strong predictor of incident coronary heart disease (CHD) and provides predictive information beyond traditional risk factors. CAC scoring is reasonable to use in the decision to withhold, postpone, or initiate statin therapy in intermediate-risk or selected borderline-risk asymptomatic adults (age 48-75 years and LDL-C >=70 to <190 mg/dL) who do not have diabetes or established atherosclerotic cardiovascular disease (ASCVD).* In intermediate-risk (10-year ASCVD risk >=7.5% to <20%) adults  or selected borderline-risk (10-year ASCVD risk >=5% to <7.5%) adults in whom a CAC score is measured for the purpose of making a treatment decision the following recommendations have been made: If CAC = 0, it is reasonable to withhold statin therapy and reassess in 5 to 10 years, as long as higher risk conditions are absent (diabetes mellitus, family history of premature CHD in first degree relatives (males <55 years; females <65 years), cigarette smoking, LDL >=190 mg/dL or other independent risk factors). If CAC is 1 to 99, it is reasonable to initiate statin therapy for patients ?75 years of age. If CAC is >=100 or >=75th percentile, it is reasonable to initiate statin therapy at any age. Cardiology referral should be considered for patients with CAC scores =400 or >=75th percentile. *2018 AHA/ACC/AACVPR/AAPA/ABC/ACPM/ADA/AGS/APhA/ASPC/NLA/PCNA Guideline on the Management of Blood Cholesterol: A Report of the American College of Cardiology/American Heart Association Task Force on Clinical Practice Guidelines. J Am Coll Cardiol. 2019;73(24):3168-3209. Rudean Haskell, MD Electronically Signed   By: Rudean Haskell MD   On: 02/25/2021 13:21   Result Date: 02/25/2021 EXAM: OVER-READ INTERPRETATION  CT CHEST The following report is an over-read performed by radiologist Dr. Misty Stanley of [facility], [place] on 02/25/2021. This over-read does not include interpretation of cardiac or coronary anatomy or pathology. The Coronary CTA interpretation by the cardiologist is attached. COMPARISON:  None. FINDINGS: Necrotic mass lesion identified between the right heart and segment II of the left liver, suspicious for liver origin. Please see dedicated CTA Chest exam performed at the same time. IMPRESSION: Necrotic mass lesion identified between the right heart and segment II of the left liver, suspicious for liver origin. For full chest findings please see report for pulmonary artery CTA Chest exam  performed at the same time. Electronically Signed: By: Misty Stanley M.D. On: 02/25/2021 11:02   ECHOCARDIOGRAM COMPLETE  Result Date: 02/13/2021    ECHOCARDIOGRAM REPORT   Patient Name:   [name] Date of Exam: 02/13/2021 Medical Rec #:  [id number]    Height:       72.0 in Accession #:    [id number]   Weight:       221.4 lb Date of Birth:  03-16-1946     BSA:          2.225 m Patient Age:    35 years     BP:           132/73 mmHg Patient Gender: M  HR:           64 bpm. Exam Location:  Outpatient Procedure: 2D Echo, Color Doppler and Cardiac Doppler Indications:    R06.9 DOE  History:        Patient has no prior history of Echocardiogram examinations.                 Risk Factors:Hypertension and Dyslipidemia.  Sonographer:    Raquel Sarna Senior RDCS Referring Phys: [id number] [place]  1. Left ventricular ejection fraction, by estimation, is 60 to 65%. The left ventricle has normal function. The left ventricle has no regional wall motion abnormalities. Left ventricular diastolic parameters are consistent with Grade I diastolic dysfunction (impaired relaxation).  2. Right ventricular systolic function is mildly reduced. The right ventricular size is normal.  3. 3.5 x 6.5 cm echolucent oval structure adjacent to the posterior wall -apperas extracardia - seems to compress the RV some. Consider pericardial cyst, hiatal hernia or other extracardiac masses such as hepatic mass/cyst - cross-sectional imaging such  as CT may be helpful.  4. The mitral valve is abnormal. Trivial mitral valve regurgitation.  5. The aortic valve is calcified. Aortic valve regurgitation is not visualized. Mild aortic valve stenosis. Aortic valve area, by VTI measures 1.71 cm. Aortic valve mean gradient measures 13.0 mmHg. Aortic valve Vmax measures 2.41 m/s. Peak gradient 23.2 mmHg. DI is 0.54.  6. The inferior vena cava is normal in size with greater than 50% respiratory variability, suggesting right atrial  pressure of 3 mmHg. Conclusion(s)/Recommendation(s): Extracardiac echolucent structure - recommend additional cross-sectional imaging. Critical findings reported to Dr. Margaretann Loveless and acknowledged at 4:40 pm on 02/13/2021. FINDINGS  Left Ventricle: Left ventricular ejection fraction, by estimation, is 60 to 65%. The left ventricle has normal function. The left ventricle has no regional wall motion abnormalities. Definity contrast agent was given IV to delineate the left ventricular  endocardial borders. The left ventricular internal cavity size was normal in size. There is no left ventricular hypertrophy. Left ventricular diastolic parameters are consistent with Grade I diastolic dysfunction (impaired relaxation). Indeterminate filling pressures. Right Ventricle: The right ventricular size is normal. No increase in right ventricular wall thickness. Right ventricular systolic function is mildly reduced. Left Atrium: Left atrial size was normal in size. Right Atrium: Right atrial size was normal in size. Pericardium: 3.5 x 6.5 cm echolucent oval structure adjacent to the posterior wall -apperas extracardia - seems to compress the RV some. Consider pericardial cyst, hiatal hernia or other extracardiac masses such as hepatic mass/cyst - cross-sectional imaging such as CT may be helpful. There is no evidence of pericardial effusion. Mitral Valve: The mitral valve is abnormal. There is mild thickening of the mitral valve leaflet(s). Mild mitral annular calcification. Trivial mitral valve regurgitation. Tricuspid Valve: The tricuspid valve is grossly normal. Tricuspid valve regurgitation is not demonstrated. Aortic Valve: The aortic valve is calcified. Aortic valve regurgitation is not visualized. Mild aortic stenosis is present. Aortic valve mean gradient measures 13.0 mmHg. Aortic valve peak gradient measures 23.2 mmHg. Aortic valve area, by VTI measures 1.71 cm. Pulmonic Valve: The pulmonic valve was not well visualized.  Pulmonic valve regurgitation is not visualized. Aorta: The aortic root and ascending aorta are structurally normal, with no evidence of dilitation. Venous: The inferior vena cava is normal in size with greater than 50% respiratory variability, suggesting right atrial pressure of 3 mmHg. IAS/Shunts: No atrial level shunt detected by color flow Doppler.  LEFT VENTRICLE PLAX 2D LVIDd:  4.30 cm  Diastology LVIDs:         3.10 cm  LV e' medial:    6.96 cm/s LV PW:         0.90 cm  LV E/e' medial:  12.0 LV IVS:        1.00 cm  LV e' lateral:   12.80 cm/s LVOT diam:     2.00 cm  LV E/e' lateral: 6.5 LV SV:         80 LV SV Index:   36 LVOT Area:     3.14 cm  RIGHT VENTRICLE RV S prime:     7.07 cm/s TAPSE (M-mode): 1.5 cm LEFT ATRIUM             Index       RIGHT ATRIUM           Index LA diam:        2.30 cm 1.03 cm/m  RA Area:     10.80 cm LA Vol (A2C):   52.7 ml 23.69 ml/m RA Volume:   22.60 ml  10.16 ml/m LA Vol (A4C):   56.4 ml 25.35 ml/m LA Biplane Vol: 58.5 ml 26.30 ml/m  AORTIC VALVE AV Area (Vmax):    1.50 cm AV Area (Vmean):   1.62 cm AV Area (VTI):     1.71 cm AV Vmax:           241.00 cm/s AV Vmean:          167.000 cm/s AV VTI:            0.469 m AV Peak Grad:      23.2 mmHg AV Mean Grad:      13.0 mmHg LVOT Vmax:         115.00 cm/s LVOT Vmean:        86.300 cm/s LVOT VTI:          0.255 m LVOT/AV VTI ratio: 0.54  AORTA Ao Root diam: 3.70 cm MITRAL VALVE MV Area (PHT): 2.00 cm     SHUNTS MV Decel Time: 379 msec     Systemic VTI:  0.26 m MV E velocity: 83.30 cm/s   Systemic Diam: 2.00 cm MV A velocity: 101.00 cm/s MV E/A ratio:  0.82 Lyman Bishop MD Electronically signed by Lyman Bishop MD Signature Date/Time: 02/13/2021/4:46:44 PM    Final     Labs:  CBC: Recent Labs    08/01/20 1036 08/12/20 0310 01/06/21 1515 03/04/21 1810  WBC 4.8 7.8 9.4 11.8*  HGB 13.9 12.7* 13.6 11.0*  HCT 43.4 38.8* 42.3 34.6*  PLT 147* 142* 300 513*    COAGS: Recent Labs    08/01/20 1036  INR  1.0  APTT 29    BMP: Recent Labs    08/01/20 1036 08/12/20 0310 01/06/21 1515 01/16/21 1626 02/23/21 0906 03/04/21 1810  NA 142 138 134* 138 136 131*  K 4.5 4.3 4.9 5.1 5.2 4.1  CL 106 108 100 104 100 98  CO2 27 21* 21* 25 22 23   GLUCOSE 142* 155* 123* 98 116* 110*  BUN 16 18 26* 12 14 17   CALCIUM 9.7 9.2 10.8* 9.9 10.7* 10.3  CREATININE 1.06 0.97 1.23 0.91 1.09 1.16  GFRNONAA >60 >60 >60  --   --  >60    LIVER FUNCTION TESTS: Recent Labs    08/01/20 1036 01/06/21 1515 03/04/21 1810  BILITOT 0.5 0.9 0.7  AST 23 25 28   ALT 18 20 20   ALKPHOS 63 122  107  PROT 6.7 8.1 7.6  ALBUMIN 3.9 3.3* 2.5*    TUMOR MARKERS: No results for input(s): AFPTM, CEA, CA199, CHROMGRNA in the last 8760 hours.  Assessment and Plan: 75 y.o. male with recent imaging findings suggesting hepatic abscess VS malignancy, who presented to [facility] ED on 03/04/2021 with chief complaint of shortness of breath and epigastric pain, currently getting admitted for further evaluation and management.  IR was requested for image guided aspiration and possible drain placement for the hepatic lesion.  Case was reviewed and approved for ultrasound-guided aspiration and possible drain placement by Dr. Earleen Newport. N.p.o. for more than 24 hours per patient and wife. VSS CBC WBC 11.8, Hgb 11, PLT 513. INR pending Not on anticoagulant/antiplatelet treatment.  Risks and benefits discussed with the patient including bleeding, infection, damage to adjacent structures, bowel perforation/fistula connection, and sepsis.  All of the patient's questions were answered, patient is agreeable to proceed. Consent signed and in chart.   Thank you for this interesting consult.  I greatly enjoyed meeting Marko Skalski and look forward to participating in their care.  A copy of this report was sent to the requesting provider on this date.  Electronically Signed: Tera Mater, PA-C 03/05/2021, 10:10 AM   I spent a total of 40 Minutes     in face to face in clinical consultation, greater than 50% of which was counseling/coordinating care for aspiration and possible drain placement for a hepatic lesion.

## 2021-03-05 NOTE — H&P (Signed)
History and Physical    [name] KGM:[id number] DOB: 04/09/1946 DOA: 03/04/2021  PCP: [name] Salter, MD Consultants:  [name] - cardiology; [facility] Patient coming from:  Home - lives with wife and son; NOK: [name], [phone]  Chief Complaint: SOB  HPI: [name] is a 75 y.o. male with medical history significant of HLD, HTN; prostate cancer s/p prostatectomy and then radiation 3 years later due to recurrence with resultant radiation cystitis, treated with hyperbaric therapy about 1 year ago; and hypothyroidism presenting with SOB. He was initially seen in the ER on 5/10 and treated for UTI with Keflex.  His fatigue persisted and so he was sent to cardiology for evaluation by his PCP.  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  He was then sent for CTA chest and it showed an 8cm mass lesion arising from the liver - neoplasm vs. Metastatic lesion.  He reports that his family made him come to the ER - they were concerned that it was taking too long to get the answers they needed.  He also has 2 nieces who are PAs and they looked at the reports and were concerned about the mass.  +SOB and fatigue over the entire several weeks, unchanged but persistent.  If he exerts himself at all, he is very SOB.  He has to rest after a shower.  He is perspiring profusely, needs a towel over his pillow at night.  He has been using Biofreeze on his neck and shoulders due to pain.  +losing weight - 30 pounds in the last 2 months.  He did have a bladder tumor that was removed 4 years ago, then treated with BCG; most recent cystoscopy was a little over a year ago.    ED Course: Recently diagnosed with ?liver abscess - abutting R ventricle.  Here for 8 weeks of malaise, undergoing outpatient evaluation.  DDx abscess vs. Malignancy.  IR will try to drain later today, diagnostic  and therapeutic tap.  Dr. Lorenso [name] advises tumor markers, wait on results but probable abscess so will hold on consultation.  Started on Zosyn, blood cultures pending.     Review of Systems: As per HPI; otherwise review of systems reviewed and negative.   Ambulatory Status:  Ambulates without assistance  COVID Vaccine Status:   Complete plus one booster  Past Medical History:  Diagnosis Date   Arthritis    knees. back   Bladder cancer ([place])    2018   High cholesterol    Hypertension    Hypothyroidism    prostate cancer    2000, 2003   Thyroid disease     Past Surgical History:  Procedure Laterality Date   EYE SURGERY Bilateral 2014   HERNIA REPAIR Right 1984   inguinal   KNEE ARTHROSCOPY Left 1966   PENILE PROSTHESIS IMPLANT  2014   TOTAL KNEE ARTHROPLASTY Right 08/11/2020   Procedure: TOTAL KNEE ARTHROPLASTY;  Surgeon: [name] Arabian, MD;  Location: [facility];  Service: Orthopedics;  Laterality: Right;  62min   TRANSURETHRAL RESECTION OF BLADDER TUMOR  2018    Social History   Socioeconomic History   Marital status: Married    Spouse name: Not on file   Number of children: Not on file   Years of education: Not on file   Highest education level: Not on file  Occupational History  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  Tobacco Use   Smoking status: Never   Smokeless tobacco: Never  Vaping Use   Vaping Use: Former   Start date: 08/01/2017   Substances: THC  Substance and Sexual Activity   Alcohol use: Yes    Alcohol/week: 2.0 standard drinks    Types: 2 Standard drinks or equivalent per week   Drug use: Not Currently    Types: Marijuana    Comment: none in at least 1 year   Sexual activity: Yes  Other Topics Concern   Not on file  Social History Narrative   Not on file   Social Determinants of Health   Financial Resource Strain: Not on file  Food Insecurity: Not on file  Transportation Needs: Not on file  Physical Activity: Not on file  Stress: Not  on file  Social Connections: Not on file  Intimate Partner Violence: Not on file    No Known Allergies  Family History  Problem Relation Age of Onset   Cancer Neg Hx     Prior to Admission medications   Medication Sig Start Date End Date Taking? Authorizing Provider  [name] (ZYLOPRIM) 100 MG tablet TAKE 1 TABLET(100 MG) BY MOUTH TWICE DAILY 10/31/20   [name] Salter, MD  [name] Duanne Moron) 1 MG tablet Take 1/2 to 1 tablet up tow twice a day as needed for anxiety. 02/27/21   [name] Salter, MD  [name] (COREG) 25 MG tablet TAKE 1 TABLET(25 MG) BY MOUTH TWICE DAILY 07/16/20   [name] Quint B, FNP  [name] 30 MG capsule Take 30 mg by mouth daily.    [provider]  [name] (FOLVITE) 1 MG tablet Take 1 mg by mouth daily.    [provider]  [name] (SYNTHROID) 88 MCG tablet Take 1 tablet (88 mcg total) by mouth daily before breakfast. 11/18/20   [name] Salter, MD  [name] (PAXIL) 10 MG tablet Take 1 tablet (10 mg total) by mouth daily. 02/27/21   [name] Salter, MD  [name] (CRESTOR) 10 MG tablet TAKE 1 TABLET(10 MG) BY MOUTH DAILY 05/01/20   [name] Maw, MD    Physical Exam: Vitals:   03/05/21 1330 03/05/21 1500 03/05/21 1550 03/05/21 1617  BP: 108/88 112/65  138/78  Pulse: 60 (!) 57  61  Resp: 16 15  16   Temp:   97.9 F (36.6 C) 97.9 F (36.6 C)  TempSrc:   Oral Oral  SpO2: 99% 100%  100%     General:  Appears calm and comfortable and is in NAD, mildly fatigued, mildly SOB with minimal exertion Eyes:  PERRL, EOMI, normal lids, iris ENT:  grossly normal hearing, lips & tongue, mmm Neck:  no LAD, masses or thyromegaly Cardiovascular:  RRR, no m/r/g. No LE edema.  Respiratory:   CTA bilaterally with no wheezes/rales/rhonchi.  Mildly increased respiratory effort with any exertion. Abdomen:  soft, NT, ND Skin:  no rash or induration seen on limited exam Musculoskeletal:  grossly normal tone BUE/BLE, good ROM, no bony  abnormality Psychiatric:  grossly normal mood and affect, speech fluent and appropriate, AOx3 Neurologic:  CN 2-12 grossly intact, moves all extremities in coordinated fashion    Radiological Exams on Admission: Independently reviewed - see discussion in A/P where applicable  DG Chest 2 View  Result Date: 03/04/2021 CLINICAL DATA:  Shortness of breath EXAM: CHEST - 2 VIEW COMPARISON:  01/06/2021 FINDINGS: The heart size and mediastinal contours are within normal limits. Both  lungs are clear. The visualized skeletal structures are unremarkable. IMPRESSION: No active cardiopulmonary disease. Electronically Signed   By: Ulyses Jarred M.D.   On: 03/04/2021 19:13    EKG: Independently reviewed.  NSR with rate 74; nonspecific ST changes with no evidence of acute ischemia   Labs on Admission: I have personally reviewed the available labs and imaging studies at the time of the admission.  Pertinent labs:   Na++ 131 Glucose 110 Albumin 2.5 WBC 11.8 Hgb 11.0 Platelets 513   Assessment/Plan Principal Problem:   Liver mass Active Problems:   Personal history of prostate cancer   Hypothyroidism   Hyperlipidemia   Essential hypertension   Liver mass -Patient with persistent fatigue, SOB with exertion, night sweats and weight loss -Echo revealed a structure pushing on the R side of the heart and further evaluation revealed an 8 cm liver mass with diaphragmatic compression and abutting the R heart -This is concerning for abscess > malignancy -IR drained and placed pigtail drain -Gram stain shows abundant WBC with abundant GPC, culture pending -Also sent for cytology -Blood cultures also pending -Appears to be a hepatic abscess, will continue Zosyn pending culture results -Will admit to Med Surg  Prostate and bladder cancer history -s/p prostatectomy and then prostate CA recurrence and so treated with radiation therapy -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 The patient has been fully vaccinated against COVID-19.   Level of care: Med-Surg DVT prophylaxis: SCDs Code Status:  Full - confirmed with patient/family Family Communication: Wife was present throughout evaluation Disposition Plan:  The patient is from: home  Anticipated d/c is to: home without [facility] services   Anticipated d/c date will depend on clinical response to treatment, likely 2-3 days  Patient is currently: acutely ill Consults called: IR; Nutrition; Oncology by telephone only  Admission status:  Admit - It is my clinical opinion that admission to INPATIENT is reasonable and necessary because of the expectation that this patient will require hospital care that crosses at least 2 midnights to treat this condition based on the medical complexity of the problems presented.  Given the aforementioned information, the predictability of an adverse outcome is felt to be significant.    Karmen Bongo MD [facility]   How to contact the [facility] Attending or Consulting provider [place] or covering provider during after hours [place], for this patient?  Check the care team in [facility] and look for a) attending/consulting TRH provider listed and [name]) the [facility] team listed Log into [email or web address] and use [place]'s universal password to access. If you do not have the password, please contact the hospital operator. Locate the [facility]  provider you are looking for under [facility] and page to a number that you can be directly reached. If you still have difficulty reaching the provider, please page the [facility] (Director on Call) for the Hospitalists listed on amion for assistance.   03/05/2021, 6:06 PM

## 2021-03-05 NOTE — Progress Notes (Addendum)
Pharmacy Antibiotic Note  [name] is a 75 y.o. male admitted on 03/04/2021 with  IAI .  Pharmacy has been consulted for Zosyn dosing.  Plan: Zosyn 3.375g IV q8h (4 hour infusion). -Monitor renal function, clinical status, and antibiotic plan  Temp (24hrs), Avg:98.2 F (36.8 C), Min:97.8 F (36.6 C), Max:98.5 F (36.9 C)  Recent Labs  Lab 03/04/21 1810  WBC 11.8*  CREATININE 1.16    Estimated Creatinine Clearance: 66.5 mL/min (by C-G formula based on SCr of 1.16 mg/dL).    No Known Allergies  Antimicrobials this admission: Zosyn 7/7 >>   Thank you for allowing pharmacy to be a part of this patient's care.  Joetta Manners, PharmD, [facility] Emergency Medicine Clinical Pharmacist ED RPh Phone: [place]: [phone]

## 2021-03-06 DIAGNOSIS — R160 Hepatomegaly, not elsewhere classified: Secondary | ICD-10-CM

## 2021-03-06 LAB — BASIC METABOLIC PANEL
Anion gap: 8 (ref 5–15)
BUN: 19 mg/dL (ref 8–23)
CO2: 25 mmol/L (ref 22–32)
Calcium: 10.1 mg/dL (ref 8.9–10.3)
Chloride: 100 mmol/L (ref 98–111)
Creatinine, Ser: 1.23 mg/dL (ref 0.61–1.24)
GFR, Estimated: >60 mL/min (ref >60)
Glucose, Bld: 109 mg/dL — ABNORMAL HIGH (ref 70–99)
Potassium: 3.7 mmol/L (ref 3.5–5.1)
Sodium: 133 mmol/L — ABNORMAL LOW (ref 135–145)

## 2021-03-06 LAB — CBC
HCT: 31.4 % — ABNORMAL LOW (ref 39.0–52.0)
Hemoglobin: 10.3 g/dL — ABNORMAL LOW (ref 13.0–17.0)
MCH: 27 pg (ref 26.0–34.0)
MCHC: 32.8 g/dL (ref 30.0–36.0)
MCV: 82.2 fL (ref 80.0–100.0)
Platelets: 443 10*3/uL — ABNORMAL HIGH (ref 150–400)
RBC: 3.82 MIL/uL — ABNORMAL LOW (ref 4.22–5.81)
RDW: 16.6 % — ABNORMAL HIGH (ref 11.5–15.5)
WBC: 7.7 10*3/uL (ref 4.0–10.5)
nRBC: 0 % (ref 0.0–0.2)

## 2021-03-06 LAB — CANCER ANTIGEN 19-9: CA 19-9: <2 U/mL (ref 0–35)

## 2021-03-06 LAB — CEA: CEA: 1 ng/mL (ref 0.0–4.7)

## 2021-03-06 LAB — AFP TUMOR MARKER: AFP, Serum, Tumor Marker: <0.9 ng/mL (ref 0.0–8.4)

## 2021-03-06 LAB — CYTOLOGY - NON PAP

## 2021-03-06 MED ORDER — ADULT MULTIVITAMIN W/MINERALS CH
1.0000 | ORAL_TABLET | Freq: Every day | ORAL | Status: DC
  Administered 2021-03-06 – 2021-03-09 (×4): 1 via ORAL
  Filled 2021-03-06 (×4): qty 1

## 2021-03-06 MED ORDER — ENSURE ENLIVE PO LIQD: 237.0000 mL | Freq: Two times a day (BID) | ORAL | Status: DC

## 2021-03-06 NOTE — TOC Initial Note (Signed)
Transition of Care [facility]) - Initial/Assessment Note    Patient Details  Name: [name] MRN: [id number] Date of Birth: 1946/01/27  [facility] CM/SW Contact:    Verdell Carmine, RN Phone Number: 03/06/2021, 11:25 AM  Clinical Narrative:                  Patient admitted with liver abbess, drained by IR, currently has drain in . Patient from home with spouse may need home health, depending on length of time drain is in. CM will follow for needs  Expected Discharge Plan: [place] Barriers to Discharge: Continued Medical Work up   Patient Goals and CMS Choice        Expected Discharge Plan and Services Expected Discharge Plan: [place]       Living arrangements for the past 2 months: Single Family Home                                      Prior Living Arrangements/Services Living arrangements for the past 2 months: Single Family Home Lives with:: Spouse Patient language and need for interpreter reviewed:: Yes        Need for Family Participation in Patient Care: Yes (Comment) Care giver support system in place?: Yes (comment)   Criminal Activity/Legal Involvement Pertinent to Current Situation/Hospitalization: No - Comment as needed  Activities of Daily Living Home Assistive Devices/Equipment: Scales, Eyeglasses, Grab bars around toilet, Grab bars in shower, Hand-held shower hose ADL Screening (condition at time of admission) Patient's cognitive ability adequate to safely complete daily activities?: Yes Is the patient deaf or have difficulty hearing?: No Does the patient have difficulty seeing, even when wearing glasses/contacts?: No Does the patient have difficulty concentrating, remembering, or making decisions?: No Patient able to express need for assistance with ADLs?: Yes Does the patient have difficulty dressing or bathing?: No Independently performs ADLs?: Yes (appropriate for developmental  age) Does the patient have difficulty walking or climbing stairs?: No Weakness of Legs: None Weakness of Arms/Hands: None  Permission Sought/Granted                  Emotional Assessment       Orientation: : Oriented to Self, Oriented to Place, Oriented to  Time, Oriented to Situation Alcohol / Substance Use: Not Applicable Psych Involvement: No (comment)  Admission diagnosis:  Hepatic abscess [K75.0] Liver mass [R16.0] Abdominal infection ([place]) [K65.9] Patient Active Problem List   Diagnosis Date Noted   Liver mass 03/05/2021   Nephrolithiasis 02/27/2021   Aortic atherosclerosis ([place]) 02/27/2021   Abscess of liver 02/25/2021   Prediabetes 11/18/2020   OA (osteoarthritis) of knee 08/11/2020   Primary osteoarthritis of right knee 08/11/2020   Essential hypertension 01/11/2020   Onychomycosis 10/12/2019   Hypothyroidism 10/12/2019   Hyperlipidemia 10/12/2019   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   Thrombocytopenia ([place]) 05/03/2019   Gout 05/03/2019   Screen for colon cancer 05/03/2019   Personal history of prostate cancer 05/03/2019   Obesity (BMI 30.0-34.9) 04/26/2019   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   PCP:  Haydee Salter, MD Pharmacy:   [facility] [phone] [address], [place] [facility] [address] AT [facility] [address] [place] [address] [place] [address] Phone: [phone] Fax: [phone]     Social Determinants of Health (SDOH) Interventions    Readmission  Risk Interventions No flowsheet data found.

## 2021-03-06 NOTE — Hospital Course (Signed)
75 year old white male with prostate cancer status post XRT-complication of bleeding 2/2 bladder issues + BCG November 2021?, prediabetes, obesity BMI 28, HTN  Rx UTI 01/16/2021  35 pound weight loss/8 weeks-fatigue-SOB-diaphoresis prompted return visit to cardiology 02/24/2021-  echocardiogram 02/13/2021 noted 3.5 X6.5 echolucent structure extracardiac mass compressing RV - -6/29 CT chest no PE 8 cm mass left liver lobe,  7/5 CT ABD confirm 8.5 X8.4 liver mass suggestive = hepatic cyst?  Hepatic abscess  7/7 IR consulted pigtail placement Gram stain = WBC GPC culture pending-CA 19-9, CEA, AFP pending  WBC 11.8-->7.7 Hemoglobin 11.0-->10.3 BUN/creatinine 17/1.1--+19/1.2

## 2021-03-06 NOTE — Progress Notes (Signed)
Referring Physician(s): Dr. Langston Masker, M. / Dr. Lorin Mercy, J.  Supervising Physician: Ruthann Cancer  Patient Status:  [facility] - In-pt  Chief Complaint:  S/p hepatic abscess drain placement   Subjective:  Patient laying in bed, not in acute distress. States that he is very glad that the liver lesion was abscess not a mass. State mild soreness around the drain site.  Denies abdominal pain, nausea, vomiting.  Allergies: Patient has no known allergies.  Medications: Prior to Admission medications   Medication Sig Start Date End Date Taking? Authorizing Provider  allopurinol (ZYLOPRIM) 100 MG tablet TAKE 1 TABLET(100 MG) BY MOUTH TWICE DAILY 10/31/20  Yes [name], Lillette Boxer, MD  ALPRAZolam Duanne Moron) 1 MG tablet Take 1/2 to 1 tablet up tow twice a day as needed for anxiety. Patient taking differently: Take 0.5-1 mg by mouth as needed for anxiety. 02/27/21  Yes Haydee Salter, MD  carvedilol (COREG) 25 MG tablet TAKE 1 TABLET(25 MG) BY MOUTH TWICE DAILY Patient taking differently: Take 25 mg by mouth 2 (two) times daily. 07/16/20  Yes Dutch Quint B, FNP  Coenzyme Q10 (COQ10 PO) Take 1 Dose by mouth daily. Liquid form   Yes [provider]  folic acid (FOLVITE) 1 MG tablet Take 1 mg by mouth daily.   Yes [provider]  levothyroxine (SYNTHROID) 88 MCG tablet Take 1 tablet (88 mcg total) by mouth daily before breakfast. 11/18/20  Yes Haydee Salter, MD  PARoxetine (PAXIL) 10 MG tablet Take 1 tablet (10 mg total) by mouth daily. Patient taking differently: Take 10 mg by mouth as needed (anxiety). 02/27/21  Yes Haydee Salter, MD  rosuvastatin (CRESTOR) 10 MG tablet TAKE 1 TABLET(10 MG) BY MOUTH DAILY Patient taking differently: Take 10 mg by mouth daily. 05/01/20  Yes Libby Maw, MD     Vital Signs: BP (!) 141/75 (BP Location: Right Arm)   Pulse 63   Temp 98.1 F (36.7 C) (Oral)   Resp 18   Wt 210 lb 4.8 oz (95.4 kg)   SpO2 96%   BMI 28.52 kg/m   Physical  Exam Vitals reviewed.  Constitutional:      General: He is not in acute distress.    Appearance: Normal appearance. He is not ill-appearing.  HENT:     Head: Normocephalic and atraumatic.  Cardiovascular:     Rate and Rhythm: Normal rate.  Pulmonary:     Effort: Pulmonary effort is normal.  Abdominal:     General: Abdomen is flat.     Palpations: Abdomen is soft.  Musculoskeletal:     Cervical back: Neck supple.  Skin:    General: Skin is warm and dry.     Coloration: Skin is not jaundiced or pale.     Comments: Positive mid upper abdomen drain to a suction bulb. Site is unremarkable with no erythema, edema, tenderness, bleeding or drainage. Suture and stat lock in place. Dressing is clean, dry, and intact. 10 ml of  thick blood colored fluid noted in the bulb. Drain aspirates and flushes well.    Neurological:     Mental Status: He is alert and oriented to person, place, and time.  Psychiatric:        Mood and Affect: Mood normal.        Behavior: Behavior normal.        Judgment: Judgment normal.    Imaging: CT ABDOMEN PELVIS W WO CONTRAST  Result Date: 03/03/2021 CLINICAL DATA:  Liver mass  identified by prior CT chest angiogram EXAM: CT ABDOMEN AND PELVIS WITHOUT AND WITH CONTRAST TECHNIQUE: Multidetector CT imaging of the abdomen and pelvis was performed following the standard protocol before and following the bolus administration of intravenous contrast. CONTRAST:  175mL ISOVUE-300 IOPAMIDOL (ISOVUE-300) INJECTION 61% COMPARISON:  None. FINDINGS: Lower chest: No acute abnormality. Hepatobiliary: There is a large, rim enhancing mass with near fluid internal attenuation arising from the left lobe of the liver and involving or crossing the diaphragm, very closely abutting the right ventricle (series 15, image 56), measuring 8.5 x 8.4 cm, slightly increased in size compared to recent prior examination, at which time it measured 8.1 x 7.9 cm when measured similarly (series 4, image 35,  series 17, image 63). There is a component that appears to extend beyond the liver capsule into the gastrohepatic ligament, tenting the adjacent lesser curvature of the stomach (series 4, image 55). No gallstones, gallbladder wall thickening, or biliary dilatation. Pancreas: Unremarkable. No pancreatic ductal dilatation or surrounding inflammatory changes. Spleen: Normal in size without significant abnormality. Adrenals/Urinary Tract: Adrenal glands are unremarkable. Multiple small bilateral nonobstructive renal calculi. Thickening of the decompressed urinary bladder wall. Stomach/Bowel: Stomach is within normal limits. Appendix appears normal. No evidence of bowel wall thickening, distention, or inflammatory changes. Descending and sigmoid diverticula. Vascular/Lymphatic: Aortic atherosclerosis. Prominent celiac axis, portacaval, and retroperitoneal lymph nodes. Reproductive: Status post prostatectomy. Penile implant and left lower quadrant reservoir. Other: No abdominal wall hernia or abnormality. No abdominopelvic ascites. Musculoskeletal: No acute or significant osseous findings. IMPRESSION: 1. There is a large, rim enhancing mass with near fluid internal attenuation arising from the left lobe of the liver and involving or crossing the diaphragm and very closely abutting the right ventricle, measuring 8.5 x 8.4 cm, slightly increased in size compared to recent prior examination. There is a component that appears to extend beyond the liver capsule into the gastrohepatic ligament, tenting the adjacent lesser curvature of the stomach. Findings are most suggestive of hepatic abscess. Malignancy with internal necrosis is difficult to exclude. 2. Prominent celiac axis, portacaval, and retroperitoneal lymph nodes, nonspecific and possibly reactive. 3. Status post prostatectomy. Penile implant and left lower quadrant reservoir. 4. Nonobstructive bilateral nephrolithiasis. These results will be called to the ordering  clinician or representative by the Radiologist Assistant, and communication documented in the PACS or [employer]. Aortic Atherosclerosis (ICD10-I70.0). Electronically Signed   By: Eddie Candle M.D.   On: 03/03/2021 16:18   DG Chest 2 View  Result Date: 03/04/2021 CLINICAL DATA:  Shortness of breath EXAM: CHEST - 2 VIEW COMPARISON:  01/06/2021 FINDINGS: The heart size and mediastinal contours are within normal limits. Both lungs are clear. The visualized skeletal structures are unremarkable. IMPRESSION: No active cardiopulmonary disease. Electronically Signed   By: Ulyses Jarred M.D.   On: 03/04/2021 19:13   [place] Abscess Drain  Result Date: 03/05/2021 INDICATION: 75 year old male with fluid collection within the left liver concerning for abscess EXAM: IMAGE GUIDED DRAINAGE OF LIVER ABSCESS MEDICATIONS: None ANESTHESIA/SEDATION: Fentanyl 1.5 mcg IV; Versed 50 mg IV Moderate Sedation Time:  16 minutes The patient was continuously monitored during the procedure by the interventional radiology nurse under my direct supervision. COMPLICATIONS: None PROCEDURE: Informed written consent was obtained from the patient after a thorough discussion of the procedural risks, benefits and alternatives. All questions were addressed. Maximal Sterile Barrier Technique was utilized including caps, mask, sterile gowns, sterile gloves, sterile drape, hand hygiene and skin antiseptic. A timeout was performed prior to the  initiation of the procedure. Patient was positioned supine position on the stretcher. Images of the upper abdomen in the subxiphoid region were stored sent to PACs. The patient was then prepped and draped in the usual sterile fashion. 1% lidocaine was used for local anesthesia. Small stab incision was made with 11 blade scalpel. Twelve French drain was then placed via trocar technique into the abscess of the left liver. Once the catheter was in position we aspirated approximately 130 cc of purulent material.  Sample was sent for cytology and for culture. Abscess drain was attached to bulb suction. Drain was sutured in position. Patient tolerated the procedure well and remained hemodynamically stable throughout. No complications were encountered and no significant blood loss. IMPRESSION: Status post ultrasound-guided drainage of left liver abscess. Signed, Dulcy Fanny. Dellia Nims, RPVI [facility] Electronically Signed   By: Corrie Mckusick D.O.   On: 03/05/2021 19:26    Labs:  CBC: Recent Labs    08/12/20 0310 01/06/21 1515 03/04/21 1810 03/06/21 0423  WBC 7.8 9.4 11.8* 7.7  HGB 12.7* 13.6 11.0* 10.3*  HCT 38.8* 42.3 34.6* 31.4*  PLT 142* 300 513* 443*    COAGS: Recent Labs    08/01/20 1036 03/05/21 1006  INR 1.0 1.3*  APTT 29  --     BMP: Recent Labs    08/12/20 0310 01/06/21 1515 01/16/21 1626 02/23/21 0906 03/04/21 1810 03/06/21 0423  NA 138 134* 138 136 131* 133*  K 4.3 4.9 5.1 5.2 4.1 3.7  CL 108 100 104 100 98 100  CO2 21* 21* 25 22 23 25   GLUCOSE 155* 123* 98 116* 110* 109*  BUN 18 26* 12 14 17 19   CALCIUM 9.2 10.8* 9.9 10.7* 10.3 10.1  CREATININE 0.97 1.23 0.91 1.09 1.16 1.23  GFRNONAA >60 >60  --   --  >60 >60    LIVER FUNCTION TESTS: Recent Labs    08/01/20 1036 01/06/21 1515 03/04/21 1810  BILITOT 0.5 0.9 0.7  AST 23 25 28   ALT 18 20 20   ALKPHOS 63 122 107  PROT 6.7 8.1 7.6  ALBUMIN 3.9 3.3* 2.5*    Assessment and Plan:  75 yo male with hepatic abscess, s/p drain placement with Dr. Earleen Newport on 7/7   Pt stable, drain intact, flushes and aspirates well. Puncture site unremarkable, no s/s of bleeding or infection.  OP 340 cc  VSS WBC 7.7 today (11.8 yesterday) Cx pending   Continue with flushing TID, output recording q shift and dressing changes as needed. Would consider additional imaging when output is less than 10 ml for 24 hours not including flush material.    Further treatment plan per  [facility] Appreciate and agree with the plan.  IR to follow.    Electronically Signed: Tera Mater, PA-C 03/06/2021, 8:47 AM   I spent a total of 15 Minutes at the the patient's bedside AND on the patient's hospital floor or unit, greater than 50% of which was counseling/coordinating care for hepatic abscess drain

## 2021-03-06 NOTE — Progress Notes (Signed)
Pharmacy Antibiotic Note  [name] is a 75 y.o. male admitted on 03/04/2021 with  liver abscess .  Pharmacy has been consulted for Zosyn dosing.   ID: IAI. Afebrile. WBC 11.8>7.7 down today.. Scr 1.23 up slightly.  Zosyn 7/7 >  7/7: Liver abscess>> 7/7: BC x 2>> 7/7: COVID/flu: neg  Plan: Con't Zosyn 3.375g IV q8hr. Pharmacy will sign off. Please reconsult for further dosing assitance.    Weight: 95.4 kg (210 lb 4.8 oz)  Temp (24hrs), Avg:97.9 F (36.6 C), Min:97.9 F (36.6 C), Max:98.1 F (36.7 C)  Recent Labs  Lab 03/04/21 1810 03/06/21 0423  WBC 11.8* 7.7  CREATININE 1.16 1.23    Estimated Creatinine Clearance: 62.2 mL/min (by C-G formula based on SCr of 1.23 mg/dL).    No Known Allergies  [name] [place], PharmD, BCPS Clinical Staff Pharmacist [email or web address]   Wayland Salinas 03/06/2021 1:03 PM

## 2021-03-06 NOTE — Progress Notes (Signed)
PROGRESS NOTE   [name]  QAS:[id number] DOB: 09-Jul-1946 DOA: 03/04/2021 PCP: Haydee Salter, MD  Brief Narrative:   75 year old white male with prostate cancer status post XRT-complication of bleeding 2/2 bladder issues + BCG November 2021?, prediabetes, obesity BMI 28, HTN  Rx UTI 01/16/2021  35 pound weight loss/8 weeks-fatigue-SOB-diaphoresis prompted return visit to cardiology 02/24/2021-  echocardiogram 02/13/2021 noted 3.5 X6.5 echolucent structure extracardiac mass compressing RV - -6/29 CT chest no PE 8 cm mass left liver lobe,  7/5 CT ABD confirm 8.5 X8.4 liver mass suggestive = hepatic cyst?  Hepatic abscess  7/7 IR consulted pigtail placement Gram stain = WBC GPC culture pending-CA 19-9, CEA, AFP pending   Hospital-Problem based course  Liver abcess Continue Zosyn-await cult-expect protracted course of antibiotics likely Augmentin No recent exotic travel-no pets-no raw foods eaten although does eat out a lot Abcess drain to be managed by IR, including drain studies--output seems to be dropping significantly Very low concern malignancy-CA 19-9/CEA/AFP negative saline lock LR In a.m. 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 Outpatient follow-up no current issues HTN Moderately controlled on Coreg 25 twice daily, as needed hydralazine noted Prediabetes CBGs minimally elevated-monitor only Depression Declining use of Paxil-as needed Ativan 0.5-1 twice daily as needed  DVT prophylaxis: SCD Code Status: Full Family Communication: Wife at bedside Mateo Flow ([phone]) and discussed with her in detail 7/8 Disposition:  Status is: Inpatient  Remains inpatient appropriate because:Hemodynamically unstable, Persistent severe electrolyte disturbances, and Ongoing diagnostic testing needed not appropriate for outpatient work up  Dispo: The patient is from: Home              Anticipated d/c is to: Home               Patient currently is not medically stable to d/c.   Difficult to place patient No       Consultants:  Interventional radiology  Procedures: Pigtail drain catheter placement 03/05/2021  Antimicrobials: Zosyn 03/05/2021   Subjective: Relieved to hear that this is not cancer-drain seems to be decreasing Ambulating in the room Pain is minimal No fever no chills nausea vomiting tolerating diet  Objective: Vitals:   03/05/21 1617 03/05/21 2052 03/05/21 2358 03/06/21 0422  BP: 138/78 123/69 (!) 102/58 (!) 141/75  Pulse: 61 (!) 57 60 63  Resp: 16 16 18 18   Temp: 97.9 F (36.6 C) 97.9 F (36.6 C) 97.9 F (36.6 C) 98.1 F (36.7 C)  TempSrc: Oral Oral Oral Oral  SpO2: 100% 98% 97% 96%  Weight: 95.4 kg       Intake/Output Summary (Last 24 hours) at 03/06/2021 1108 Last data filed at 03/06/2021 0800 Gross per 24 hour  Intake 1041.8 ml  Output 340 ml  Net 701.8 ml   Filed Weights   03/05/21 1617  Weight: 95.4 kg    Examination: Awake coherent no distress thick neck CTA B no rales no rhonchi Abdomen soft drain in place no rebound no guarding-somewhat obese No lower extremity edema no rash   Data Reviewed: personally reviewed    WBC 11.8-->7.7 Hemoglobin 11.0-->10.3 BUN/creatinine 17/1.1--+19/1.2   Radiology Studies: DG Chest 2 View  Result Date: 03/04/2021 CLINICAL DATA:  Shortness of breath EXAM: CHEST - 2 VIEW COMPARISON:  01/06/2021 FINDINGS: The heart size and mediastinal contours are within normal limits. Both lungs are clear. The visualized skeletal structures are unremarkable. IMPRESSION: No active cardiopulmonary disease. Electronically Signed   By: Lennette Bihari  [place] M.D.   On: 03/04/2021 19:13   [place] Abscess Drain  Result Date: 03/05/2021 INDICATION: 75 year old male with fluid collection within the left liver concerning for abscess EXAM: IMAGE GUIDED DRAINAGE OF LIVER ABSCESS MEDICATIONS: None ANESTHESIA/SEDATION: Fentanyl 1.5 mcg IV; Versed 50 mg IV Moderate  Sedation Time:  16 minutes The patient was continuously monitored during the procedure by the interventional radiology nurse under my direct supervision. COMPLICATIONS: None PROCEDURE: Informed written consent was obtained from the patient after a thorough discussion of the procedural risks, benefits and alternatives. All questions were addressed. Maximal Sterile Barrier Technique was utilized including caps, mask, sterile gowns, sterile gloves, sterile drape, hand hygiene and skin antiseptic. A timeout was performed prior to the initiation of the procedure. Patient was positioned supine position on the stretcher. Images of the upper abdomen in the subxiphoid region were stored sent to PACs. The patient was then prepped and draped in the usual sterile fashion. 1% lidocaine was used for local anesthesia. Small stab incision was made with 11 blade scalpel. Twelve French drain was then placed via trocar technique into the abscess of the left liver. Once the catheter was in position we aspirated approximately 130 cc of purulent material. Sample was sent for cytology and for culture. Abscess drain was attached to bulb suction. Drain was sutured in position. Patient tolerated the procedure well and remained hemodynamically stable throughout. No complications were encountered and no significant blood loss. IMPRESSION: Status post ultrasound-guided drainage of left liver abscess. Signed, Dulcy Fanny. Dellia Nims, RPVI [facility] Radiology Electronically Signed   By: Corrie Mckusick D.O.   On: 03/05/2021 19:26     Scheduled Meds:  allopurinol  100 mg Oral BID   carvedilol  25 mg Oral BID WC   docusate sodium  100 mg Oral BID   levothyroxine  88 mcg Oral QAC breakfast   PARoxetine  10 mg Oral Daily   rosuvastatin  10 mg Oral Daily   Continuous Infusions:  lactated ringers 75 mL/hr at 03/06/21 0658   piperacillin-tazobactam (ZOSYN)  IV 3.375 g (03/06/21 1005)     LOS:  1 day   Time spent: [place], MD [facility] To contact the attending provider between 7A-7P or the covering provider during after hours 7P-7A, please log into the web site [email or web address] and access using universal [place] password for that web site. If you do not have the password, please call the hospital operator.  03/06/2021, 11:08 AM

## 2021-03-06 NOTE — Discharge Instructions (Signed)

## 2021-03-06 NOTE — Progress Notes (Signed)
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  GOAL:   Patient will meet greater than or equal to 90% of their needs  MONITOR:   PO intake, Supplement acceptance, Weight trends, Labs, I & O's  REASON FOR ASSESSMENT:   Consult Other (Comment) ("nutritional goals")  ASSESSMENT:   Pt with PMH significant for HTN, hypothyroidism, arthritis, prostate cancer s/p radiation therapy presenting with persistent fatigue and epigastric discomfort; pt found to have liver mass which appears to be a hepatic abscess as this time. Pt underwent image-guided drain placement for hepatic abscess on 7/07.  Discussed pt with RN in detail.   Pt endorses weight loss over the last 2 months 2/2 inadequate oral intake. Pt states that he has had a lack of interest in food in addition to early satiety. Per weight readings, pt has experienced a 12% weight loss x2 months, which is significant for time frame.   Pt states that appetite has improved since his procedure yesterday -- meal completions documented as 100% x 2 recorded meals. Pt agreeable to Ensure Enlive to provide additional calories/protein.   No UOP documented x 24 hours Drain: 324ml output x24 hours  Medications: colace Labs: Na 133 (L), Hgb 10.3 (L)  NUTRITION - FOCUSED PHYSICAL EXAM:  Flowsheet Row Most Recent Value  Orbital Region Mild depletion  Upper Arm Region Moderate depletion  Thoracic and Lumbar Region Mild  depletion  Buccal Region Mild depletion  Temple Region Mild depletion  Clavicle Bone Region Mild depletion  Clavicle and Acromion Bone Region Mild depletion  Scapular Bone Region Mild depletion  Dorsal Hand Mild depletion  Patellar Region Mild depletion  Anterior Thigh Region Moderate depletion  Posterior Calf Region Moderate depletion  Edema (RD Assessment) None  Hair Reviewed  Eyes Reviewed  Mouth Reviewed  Skin Reviewed  Nails Reviewed       Diet Order:   Diet Order             Diet regular Room service appropriate? Yes; Fluid consistency: Thin  Diet effective now                   EDUCATION NEEDS:   Education needs have been addressed  Skin:  Skin Assessment: Reviewed RN Assessment  Last BM:  7/7  Height:   Ht Readings from Last 1 Encounters:  02/27/21 6' (1.829 m)    Weight:   Wt Readings from Last 1 Encounters:  03/05/21 95.4 kg    BMI:  Body mass index is 28.52 kg/m.  Estimated Nutritional Needs:   Kcal:  1194-1740 kcals  Protein:  115-120g  Fluid:  >2L    [name] Ina, MS, RD, LDN (she/her/hers) RD pager number and weekend/on-call pager number located in [place].

## 2021-03-06 NOTE — Plan of Care (Signed)
  Problem: Education: Goal: Knowledge of General Education information will improve Description: Including pain rating scale, medication(s)/side effects and non-pharmacologic comfort measures Outcome: Progressing   Problem: Activity: Goal: Risk for activity intolerance will decrease Outcome: Progressing   Problem: Nutrition: Goal: Adequate nutrition will be maintained Outcome: Progressing   Problem: Elimination: Goal: Will not experience complications related to bowel motility Outcome: Progressing Goal: Will not experience complications related to urinary retention Outcome: Progressing   Problem: Skin Integrity: Goal: Risk for impaired skin integrity will decrease Outcome: Progressing

## 2021-03-07 ENCOUNTER — Inpatient Hospital Stay (HOSPITAL_BASED_OUTPATIENT_CLINIC_OR_DEPARTMENT_OTHER): Admit: 2021-03-07 | Payer: Medicare Other

## 2021-03-07 DIAGNOSIS — R160 Hepatomegaly, not elsewhere classified: Secondary | ICD-10-CM | POA: Diagnosis not present

## 2021-03-07 LAB — CBC WITH DIFFERENTIAL/PLATELET
Abs Immature Granulocytes: 0.03 10*3/uL (ref 0.00–0.07)
Basophils Absolute: 0 10*3/uL (ref 0.0–0.1)
Basophils Relative: 1 %
Eosinophils Absolute: 0.1 10*3/uL (ref 0.0–0.5)
Eosinophils Relative: 3 %
HCT: 30.5 % — ABNORMAL LOW (ref 39.0–52.0)
Hemoglobin: 9.8 g/dL — ABNORMAL LOW (ref 13.0–17.0)
Immature Granulocytes: 1 %
Lymphocytes Relative: 23 %
Lymphs Abs: 0.9 10*3/uL (ref 0.7–4.0)
MCH: 26.5 pg (ref 26.0–34.0)
MCHC: 32.1 g/dL (ref 30.0–36.0)
MCV: 82.4 fL (ref 80.0–100.0)
Monocytes Absolute: 0.5 10*3/uL (ref 0.1–1.0)
Monocytes Relative: 12 %
Neutro Abs: 2.3 10*3/uL (ref 1.7–7.7)
Neutrophils Relative %: 60 %
Platelets: 379 10*3/uL (ref 150–400)
RBC: 3.7 MIL/uL — ABNORMAL LOW (ref 4.22–5.81)
RDW: 16.4 % — ABNORMAL HIGH (ref 11.5–15.5)
WBC: 3.8 10*3/uL — ABNORMAL LOW (ref 4.0–10.5)
nRBC: 0 % (ref 0.0–0.2)

## 2021-03-07 LAB — COMPREHENSIVE METABOLIC PANEL
ALT: 26 U/L (ref 0–44)
AST: 32 U/L (ref 15–41)
Albumin: 2.2 g/dL — ABNORMAL LOW (ref 3.5–5.0)
Alkaline Phosphatase: 87 U/L (ref 38–126)
Anion gap: 9 (ref 5–15)
BUN: 12 mg/dL (ref 8–23)
CO2: 24 mmol/L (ref 22–32)
Calcium: 10 mg/dL (ref 8.9–10.3)
Chloride: 105 mmol/L (ref 98–111)
Creatinine, Ser: 1.12 mg/dL (ref 0.61–1.24)
GFR, Estimated: >60 mL/min (ref >60)
Glucose, Bld: 148 mg/dL — ABNORMAL HIGH (ref 70–99)
Potassium: 3.6 mmol/L (ref 3.5–5.1)
Sodium: 138 mmol/L (ref 135–145)
Total Bilirubin: 0.5 mg/dL (ref 0.3–1.2)
Total Protein: 6.4 g/dL — ABNORMAL LOW (ref 6.5–8.1)

## 2021-03-07 NOTE — Progress Notes (Addendum)
PROGRESS NOTE   [name]  OHY:[id number] DOB: June 17, 1946 DOA: 03/04/2021 PCP: Haydee Salter, MD  Brief Narrative:   75 year old white male with prostate cancer status post XRT-complication of bleeding 2/2 bladder issues + BCG November 2021?, prediabetes, obesity BMI 28, HTN  Rx UTI 01/16/2021  35 pound weight loss/8 weeks-fatigue-SOB-diaphoresis prompted return visit to cardiology 02/24/2021-  echocardiogram 02/13/2021 noted 3.5 X6.5 echolucent structure extracardiac mass compressing RV - -6/29 CT chest no PE 8 cm mass left liver lobe,  7/5 CT ABD confirm 8.5 X8.4 liver mass suggestive = hepatic cyst?  Hepatic abscess  7/7 IR consulted pigtail placement Gram stain = WBC GPC culture 7/8 all tumor markers were negative includingCA 19-9, CEA, AFP pending  Patient is improving slowly still has drain in place Reached out to IR 7/9 regarding drain study versus neck steps   Hospital-Problem based course  Liver abcess Continue Zosyn-await cult-?  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 Outpatient follow-up no current issues HTN Coreg 25 twice daily, as needed hydralazine noted Prediabetes CBGs minimally elevated-monitor only if above 180 will consider coverage and will need to discuss in the outpatient setting with patient medications Depression Declining use of Paxil-as needed Ativan 0.5-1 twice daily as needed  DVT prophylaxis: SCD Code Status: Full Family Communication: Wife at [place] ([phone]) and discussed with her in detail 7/9 Disposition:  Status is: Inpatient  Remains inpatient appropriate because:Hemodynamically unstable, Persistent severe electrolyte disturbances,  and Ongoing diagnostic testing needed not appropriate for outpatient work up  Dispo: The patient is from: Home              Anticipated d/c is to: Home              Patient currently is not medically stable to d/c.   Difficult to place patient No   Consultants:  Interventional radiology  Procedures: Pigtail drain catheter placement 03/05/2021  Antimicrobials: Zosyn 03/05/2021   Subjective:  Ambulatory pain seems moderately controlled no fever no chills no nausea no vomiting 1 episode diarrhea attributable to laxatives  Objective: Vitals:   03/06/21 1851 03/06/21 2011 03/07/21 0601 03/07/21 0943  BP: (!) 101/59 113/63 126/70 110/68  Pulse: (!) 59 61 (!) 51 (!) 54  Resp: 17 20 16 18   Temp: 97.9 F (36.6 C) 98.3 F (36.8 C) 97.6 F (36.4 C) 98.1 F (36.7 C)  TempSrc:  Oral Oral Oral  SpO2: 99% 99% 98% 99%  Weight:   97.1 kg     Intake/Output Summary (Last 24 hours) at 03/07/2021 1146 Last data filed at 03/07/2021 1056 Gross per 24 hour  Intake 3762.15 ml  Output 55 ml  Net 3707.15 ml    Filed Weights   03/05/21 1617 03/07/21 0601  Weight: 95.4 kg 97.1 kg    Examination:  Alert coherent R4-W5 holosystolic murmur Abdomen soft no rebound no guarding drain in place with minimal drainage Neurologically intact no focal deficits moving all 4 limbs no lower extremity edema ROM intact   Data Reviewed: personally reviewed    WBC 11.8-->7.7-->3.8 Hemoglobin 11.0-->10.3--9.8 BUN/creatinine 17/1.1--> 12/1.1   Radiology Studies: [place] Abscess Drain  Result Date: 03/05/2021 INDICATION: 75 year old male with fluid collection within the left liver concerning for abscess EXAM:  IMAGE GUIDED DRAINAGE OF LIVER ABSCESS MEDICATIONS: None ANESTHESIA/SEDATION: Fentanyl 1.5 mcg IV; Versed 50 mg IV Moderate Sedation Time:  16 minutes The patient was continuously monitored during the procedure by the interventional radiology nurse under my direct supervision. COMPLICATIONS: None PROCEDURE:  Informed written consent was obtained from the patient after a thorough discussion of the procedural risks, benefits and alternatives. All questions were addressed. Maximal Sterile Barrier Technique was utilized including caps, mask, sterile gowns, sterile gloves, sterile drape, hand hygiene and skin antiseptic. A timeout was performed prior to the initiation of the procedure. Patient was positioned supine position on the stretcher. Images of the upper abdomen in the subxiphoid region were stored sent to PACs. The patient was then prepped and draped in the usual sterile fashion. 1% lidocaine was used for local anesthesia. Small stab incision was made with 11 blade scalpel. Twelve French drain was then placed via trocar technique into the abscess of the left liver. Once the catheter was in position we aspirated approximately 130 cc of purulent material. Sample was sent for cytology and for culture. Abscess drain was attached to bulb suction. Drain was sutured in position. Patient tolerated the procedure well and remained hemodynamically stable throughout. No complications were encountered and no significant blood loss. IMPRESSION: Status post ultrasound-guided drainage of left liver abscess. Signed, Dulcy Fanny. Dellia Nims, RPVI [facility] Inter[facility]iology Special[facility] Radiology Electronically Signed   By: Corrie Mckusick D.O.   On: 03/05/2021 19:26     Scheduled Meds:  allopurinol  100 mg Oral BID   carvedilol  25 mg Oral BID WC   docusate sodium  100 mg Oral BID   feeding supplement  237 mL Oral BID BM   levothyroxine  88 mcg Oral QAC breakfast   multivitamin with minerals  1 tablet Oral Daily   PARoxetine  10 mg Oral Daily   rosuvastatin  10 mg Oral Daily   Continuous Infusions:  piperacillin-tazobactam (ZOSYN)  IV 12.5 mL/hr at 03/07/21 1056     LOS: 2 days   Time spent: [place], MD [facility] To contact the attending provider between 7A-7P or the  covering provider during after hours 7P-7A, please log into the web site [email or web address] and access using universal  password for that web site. If you do not have the password, please call the hospital operator.  03/07/2021, 11:46 AM

## 2021-03-07 NOTE — Plan of Care (Signed)
  Problem: Clinical Measurements: Goal: Respiratory complications will improve Outcome: Completed/Met Goal: Cardiovascular complication will be avoided Outcome: Completed/Met   Problem: Activity: Goal: Risk for activity intolerance will decrease Outcome: Completed/Met   Problem: Nutrition: Goal: Adequate nutrition will be maintained Outcome: Completed/Met   

## 2021-03-07 NOTE — Plan of Care (Signed)
  Problem: Activity: Goal: Risk for activity intolerance will decrease Outcome: Progressing   Problem: Nutrition: Goal: Adequate nutrition will be maintained Outcome: Progressing   Problem: Safety: Goal: Ability to remain free from injury will improve Outcome: Progressing   

## 2021-03-07 NOTE — Plan of Care (Signed)
  Problem: Education: Goal: Knowledge of General Education information will improve Description: Including pain rating scale, medication(s)/side effects and non-pharmacologic comfort measures Outcome: Completed/Met   Problem: Clinical Measurements: Goal: Diagnostic test results will improve Outcome: Completed/Met   

## 2021-03-07 NOTE — Progress Notes (Signed)
Referring Physician(s): Dr. Langston Masker, M. / Dr. Lorin Mercy, J.  Supervising Physician: Mir, Sharen Heck  Patient Status:  [facility] - In-pt  Chief Complaint:  S/p hepatic abscess drain placement   Brief History:  [name] is a 75 y.o. male with past medical history of HTN, hypothyroidism, arthritis, prostate cancer s/p radiation therapy who presented to [facility] ED 03/04/21 with chief complaint of shortness of breath and epigastric discomfort.   Of note, patient has been followed by cardiology due to dyspnea on exertion, underwent CT scans on 02/25/2021 which showed an incidental finding of 8 cm hepatic mass. Subsequent CT abdomen on 03/03/21 showed findings are most suggestive of hepatic abscess, malignancy with internal necrosis is difficult to exclude.  Patient was referred to oncology for further evaluation and management of the hepatic lesion, however, patient presented to ED on 03/04/2021 due to worsening of shortness of breath and epigastric discomfort.  Patient is currently being admitted for further evaluation of shortness of breath and epigastric pain.    IR was requested for image guided aspiration and possible drain placement for the hepatic lesion.     Subjective:  Awake, sitting up in bed. No complaints.  Allergies: Patient has no known allergies.  Medications: Prior to Admission medications   Medication Sig Start Date End Date Taking? Authorizing Provider  allopurinol (ZYLOPRIM) 100 MG tablet TAKE 1 TABLET(100 MG) BY MOUTH TWICE DAILY 10/31/20  Yes [name], Lillette Boxer, MD  ALPRAZolam Duanne Moron) 1 MG tablet Take 1/2 to 1 tablet up tow twice a day as needed for anxiety. Patient taking differently: Take 0.5-1 mg by mouth as needed for anxiety. 02/27/21  Yes Haydee Salter, MD  carvedilol (COREG) 25 MG tablet TAKE 1 TABLET(25 MG) BY MOUTH TWICE DAILY Patient taking differently: Take 25 mg by mouth 2 (two) times daily. 07/16/20  Yes Dutch Quint B, FNP  Coenzyme Q10 (COQ10 PO) Take 1 Dose by mouth  daily. Liquid form   Yes [provider]  folic acid (FOLVITE) 1 MG tablet Take 1 mg by mouth daily.   Yes [provider]  levothyroxine (SYNTHROID) 88 MCG tablet Take 1 tablet (88 mcg total) by mouth daily before breakfast. 11/18/20  Yes Haydee Salter, MD  PARoxetine (PAXIL) 10 MG tablet Take 1 tablet (10 mg total) by mouth daily. Patient taking differently: Take 10 mg by mouth as needed (anxiety). 02/27/21  Yes Haydee Salter, MD  rosuvastatin (CRESTOR) 10 MG tablet TAKE 1 TABLET(10 MG) BY MOUTH DAILY Patient taking differently: Take 10 mg by mouth daily. 05/01/20  Yes Libby Maw, MD     Vital Signs: BP 110/68 (BP Location: Right Arm)   Pulse (!) 54   Temp 98.1 F (36.7 C) (Oral)   Resp 18   Wt 97.1 kg   SpO2 99%   BMI 29.03 kg/m   Physical Exam Constitutional:      Appearance: Normal appearance.  HENT:     Head: Normocephalic and atraumatic.  Cardiovascular:     Rate and Rhythm: Normal rate.  Pulmonary:     Effort: Pulmonary effort is normal. No respiratory distress.  Abdominal:     Palpations: Abdomen is soft.     Tenderness: There is no abdominal tenderness.     Comments: Drain in place. 55 mL output recorded = hazy serosanguinous drainage in bulb. Bulb was not charged properly, it was inverted up from the bottom. It will not function properly this way.  Drain flushes easily. Bulb re-charged by  squeezing sides.  Skin:    General: Skin is warm and dry.  Neurological:     General: No focal deficit present.     Mental Status: He is alert and oriented to person, place, and time.  Psychiatric:        Mood and Affect: Mood normal.        Behavior: Behavior normal.        Thought Content: Thought content normal.        Judgment: Judgment normal.    Imaging: CT ABDOMEN PELVIS W WO CONTRAST  Result Date: 03/03/2021 CLINICAL DATA:  Liver mass identified by prior CT chest angiogram EXAM: CT ABDOMEN AND PELVIS WITHOUT AND WITH CONTRAST TECHNIQUE:  Multidetector CT imaging of the abdomen and pelvis was performed following the standard protocol before and following the bolus administration of intravenous contrast. CONTRAST:  131mL ISOVUE-300 IOPAMIDOL (ISOVUE-300) INJECTION 61% COMPARISON:  None. FINDINGS: Lower chest: No acute abnormality. Hepatobiliary: There is a large, rim enhancing mass with near fluid internal attenuation arising from the left lobe of the liver and involving or crossing the diaphragm, very closely abutting the right ventricle (series 15, image 56), measuring 8.5 x 8.4 cm, slightly increased in size compared to recent prior examination, at which time it measured 8.1 x 7.9 cm when measured similarly (series 4, image 35, series 17, image 63). There is a component that appears to extend beyond the liver capsule into the gastrohepatic ligament, tenting the adjacent lesser curvature of the stomach (series 4, image 55). No gallstones, gallbladder wall thickening, or biliary dilatation. Pancreas: Unremarkable. No pancreatic ductal dilatation or surrounding inflammatory changes. Spleen: Normal in size without significant abnormality. Adrenals/Urinary Tract: Adrenal glands are unremarkable. Multiple small bilateral nonobstructive renal calculi. Thickening of the decompressed urinary bladder wall. Stomach/Bowel: Stomach is within normal limits. Appendix appears normal. No evidence of bowel wall thickening, distention, or inflammatory changes. Descending and sigmoid diverticula. Vascular/Lymphatic: Aortic atherosclerosis. Prominent celiac axis, portacaval, and retroperitoneal lymph nodes. Reproductive: Status post prostatectomy. Penile implant and left lower quadrant reservoir. Other: No abdominal wall hernia or abnormality. No abdominopelvic ascites. Musculoskeletal: No acute or significant osseous findings. IMPRESSION: 1. There is a large, rim enhancing mass with near fluid internal attenuation arising from the left lobe of the liver and involving  or crossing the diaphragm and very closely abutting the right ventricle, measuring 8.5 x 8.4 cm, slightly increased in size compared to recent prior examination. There is a component that appears to extend beyond the liver capsule into the gastrohepatic ligament, tenting the adjacent lesser curvature of the stomach. Findings are most suggestive of hepatic abscess. Malignancy with internal necrosis is difficult to exclude. 2. Prominent celiac axis, portacaval, and retroperitoneal lymph nodes, nonspecific and possibly reactive. 3. Status post prostatectomy. Penile implant and left lower quadrant reservoir. 4. Nonobstructive bilateral nephrolithiasis. These results will be called to the ordering clinician or representative by the Radiologist Assistant, and communication documented in the PACS or [employer]. Aortic Atherosclerosis (ICD10-I70.0). Electronically Signed   By: Eddie Candle M.D.   On: 03/03/2021 16:18   DG Chest 2 View  Result Date: 03/04/2021 CLINICAL DATA:  Shortness of breath EXAM: CHEST - 2 VIEW COMPARISON:  01/06/2021 FINDINGS: The heart size and mediastinal contours are within normal limits. Both lungs are clear. The visualized skeletal structures are unremarkable. IMPRESSION: No active cardiopulmonary disease. Electronically Signed   By: Ulyses Jarred M.D.   On: 03/04/2021 19:13   [place] Abscess Drain  Result Date: 03/05/2021 INDICATION: 75 year old  male with fluid collection within the left liver concerning for abscess EXAM: IMAGE GUIDED DRAINAGE OF LIVER ABSCESS MEDICATIONS: None ANESTHESIA/SEDATION: Fentanyl 1.5 mcg IV; Versed 50 mg IV Moderate Sedation Time:  16 minutes The patient was continuously monitored during the procedure by the interventional radiology nurse under my direct supervision. COMPLICATIONS: None PROCEDURE: Informed written consent was obtained from the patient after a thorough discussion of the procedural risks, benefits and alternatives. All questions were addressed.  Maximal Sterile Barrier Technique was utilized including caps, mask, sterile gowns, sterile gloves, sterile drape, hand hygiene and skin antiseptic. A timeout was performed prior to the initiation of the procedure. Patient was positioned supine position on the stretcher. Images of the upper abdomen in the subxiphoid region were stored sent to PACs. The patient was then prepped and draped in the usual sterile fashion. 1% lidocaine was used for local anesthesia. Small stab incision was made with 11 blade scalpel. Twelve French drain was then placed via trocar technique into the abscess of the left liver. Once the catheter was in position we aspirated approximately 130 cc of purulent material. Sample was sent for cytology and for culture. Abscess drain was attached to bulb suction. Drain was sutured in position. Patient tolerated the procedure well and remained hemodynamically stable throughout. No complications were encountered and no significant blood loss. IMPRESSION: Status post ultrasound-guided drainage of left liver abscess. Signed, Dulcy Fanny. Dellia Nims, RPVI [facility]ascular and Interventional Radiology Specialists [facility] Radiology Electronically Signed   By: Corrie Mckusick D.O.   On: 03/05/2021 19:26    Labs:  CBC: Recent Labs    01/06/21 1515 03/04/21 1810 03/06/21 0423 03/07/21 0912  WBC 9.4 11.8* 7.7 3.8*  HGB 13.6 11.0* 10.3* 9.8*  HCT 42.3 34.6* 31.4* 30.5*  PLT 300 513* 443* 379    COAGS: Recent Labs    08/01/20 1036 03/05/21 1006  INR 1.0 1.3*  APTT 29  --     BMP: Recent Labs    01/06/21 1515 01/16/21 1626 02/23/21 0906 03/04/21 1810 03/06/21 0423 03/07/21 0912  NA 134*   < > 136 131* 133* 138  K 4.9   < > 5.2 4.1 3.7 3.6  CL 100   < > 100 98 100 105  CO2 21*   < > 22 23 25 24   GLUCOSE 123*   < > 116* 110* 109* 148*  BUN 26*   < > 14 17 19 12   CALCIUM 10.8*   < > 10.7* 10.3 10.1 10.0  CREATININE 1.23   < > 1.09 1.16 1.23 1.12  GFRNONAA >60  --   --  >60 >60 >60   <  > = values in this interval not displayed.    LIVER FUNCTION TESTS: Recent Labs    08/01/20 1036 01/06/21 1515 03/04/21 1810 03/07/21 0912  BILITOT 0.5 0.9 0.7 0.5  AST 23 25 28  32  ALT 18 20 20 26   ALKPHOS 63 122 107 87  PROT 6.7 8.1 7.6 6.4*  ALBUMIN 3.9 3.3* 2.5* 2.2*    Assessment and Plan:  Hepatic abscess  -> s/p drain placement by Dr. Earleen Newport on 03/05/21.  Continue routine drain care with flushes as ordered.  The JP bulb was NOT charged properly. It was inverted from the bottom.   I have placed an order for RN to make sure it is charged only by squeezing the sides.   It flushed easily.   I also demonstrated to the patient how to flush and charge the  bulb.  When patient is discharged home, IR PA will arrange outpatient follow up in [facility]. Staff will call patient with appointment detals.  Electronically Signed: Murrell Redden, PA-C 03/07/2021, 12:28 PM    I spent a total of 15 Minutes at the the patient's bedside AND on the patient's hospital floor or unit, greater than 50% of which was counseling/coordinating care for f/u hepatic abscess drain.

## 2021-03-08 DIAGNOSIS — R160 Hepatomegaly, not elsewhere classified: Secondary | ICD-10-CM | POA: Diagnosis not present

## 2021-03-08 MED ORDER — AMOXICILLIN-POT CLAVULANATE 875-125 MG PO TABS
1.0000 | ORAL_TABLET | Freq: Two times a day (BID) | ORAL | Status: DC
  Administered 2021-03-08 – 2021-03-09 (×3): 1 via ORAL
  Filled 2021-03-08 (×3): qty 1

## 2021-03-08 NOTE — Plan of Care (Signed)
  Problem: Health Behavior/Discharge Planning: Goal: Ability to manage health-related needs will improve Outcome: Completed/Met   Problem: Clinical Measurements: Goal: Ability to maintain clinical measurements within normal limits will improve Outcome: Completed/Met   Problem: Coping: Goal: Level of anxiety will decrease Outcome: Completed/Met   Problem: Elimination: Goal: Will not experience complications related to bowel motility Outcome: Completed/Met Goal: Will not experience complications related to urinary retention Outcome: Completed/Met   Problem: Pain Managment: Goal: General experience of comfort will improve Outcome: Completed/Met   Problem: Safety: Goal: Ability to remain free from injury will improve Outcome: Completed/Met   Problem: Skin Integrity: Goal: Risk for impaired skin integrity will decrease Outcome: Completed/Met

## 2021-03-08 NOTE — Progress Notes (Signed)
PROGRESS NOTE   [name]  EHU:[id number] DOB: 1946/08/11 DOA: 03/04/2021 PCP: Haydee Salter, MD  Brief Narrative:   75 year old white male with prostate cancer status post XRT-complication of bleeding 2/2 bladder issues + BCG November 2021?, prediabetes, obesity BMI 28, HTN  Rx UTI 01/16/2021  35 pound weight loss/8 weeks-fatigue-SOB-diaphoresis prompted return visit to cardiology 02/24/2021-  echocardiogram 02/13/2021 noted 3.5 X6.5 echolucent structure extracardiac mass compressing RV - -6/29 CT chest no PE 8 cm mass left liver lobe,  7/5 CT ABD confirm 8.5 X8.4 liver mass suggestive = hepatic cyst?  Hepatic abscess  7/7 IR consulted pigtail placement Gram stain = WBC GPC culture 7/8 all tumor markers were negative includingCA 19-9, CEA, AFP pending  Patient is improving slowly still has drain in place Reached out to IR 7/9 regarding drain study versus neck steps   Hospital-Problem based course  Liver abcess De-escalate to Zosyn to Augmentin 7/10-await cultures-anticipate 4 to 6 weeks Augmentin for abscess-if no fever, cultures are pansensitive, probable discharge in a.m. 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 Outpatient follow-up no current issues HTN Coreg 25 twice daily, as needed hydralazine noted Prediabetes CBGs 109 through 148, eating 100% of meals Depression Declining use of Paxil-as needed Ativan 0.5-1 twice daily as needed  DVT prophylaxis: SCD Code Status: Full Family Communication: Wife at bedside Mateo Flow [phone]) and discussed with her in detail 7/9 Disposition:  Status is: Inpatient  Remains inpatient appropriate because:Hemodynamically unstable, Persistent severe electrolyte disturbances, and Ongoing diagnostic testing needed not appropriate for outpatient  work up  Dispo: The patient is from: Home              Anticipated d/c is to: Home              Patient currently is not medically stable to d/c.   Difficult to place patient No   Consultants:  Interventional radiology  Procedures: Pigtail drain catheter placement 03/05/2021  Antimicrobials: Zosyn 03/05/2021   Subjective:  Awake coherent - Chills, - fever, - cough -: No pain at site of drain No lower extremity edema  Objective: Vitals:   03/07/21 0943 03/07/21 1637 03/07/21 2004 03/08/21 0446  BP: 110/68 135/73 124/80 140/81  Pulse: (!) 54 (!) 51 (!) 54 (!) 51  Resp: 18 18 20 18   Temp: 98.1 F (36.7 C) 97.7 F (36.5 C) (!) 97.5 F (36.4 C) 97.7 F (36.5 C)  TempSrc: Oral Oral Oral Oral  SpO2: 99% 98% 100% 99%  Weight:        Intake/Output Summary (Last 24 hours) at 03/08/2021 7858 Last data filed at 03/08/2021 0600 Gross per 24 hour  Intake 586.83 ml  Output 20 ml  Net 566.83 ml    Filed Weights   03/05/21 1617 03/07/21 0601  Weight: 95.4 kg 97.1 kg    Examination:  Pleasant no distress EOMI NCAT thick neck Chest clear no rales rhonchi or absent no added sound Not on telemetry I5-O2 holosystolic murmur Abdomen soft drain in place draining well-probably about 30 cc over the past shift Neurologically intact no focal deficit  Data Reviewed: personally reviewed   No labs today  Radiology Studies: No results found.   Scheduled Meds:  allopurinol  100 mg Oral BID   amoxicillin-clavulanate  1 tablet Oral Q12H   carvedilol  25  mg Oral BID WC   feeding supplement  237 mL Oral BID BM   levothyroxine  88 mcg Oral QAC breakfast   multivitamin with minerals  1 tablet Oral Daily   PARoxetine  10 mg Oral Daily   rosuvastatin  10 mg Oral Daily   Continuous Infusions:     LOS: 3 days   Time spent: 92  Nita Sells, MD [facility] To contact the attending provider between 7A-7P or the covering provider during after hours 7P-7A, please log  into the web site [email or web address] and access using universal [place] password for that web site. If you do not have the password, please call the hospital operator.  03/08/2021, 9:24 AM

## 2021-03-09 DIAGNOSIS — R160 Hepatomegaly, not elsewhere classified: Secondary | ICD-10-CM | POA: Diagnosis not present

## 2021-03-09 LAB — CBC WITH DIFFERENTIAL/PLATELET
Abs Immature Granulocytes: 0.02 10*3/uL (ref 0.00–0.07)
Basophils Absolute: 0 10*3/uL (ref 0.0–0.1)
Basophils Relative: 0 %
Eosinophils Absolute: 0.1 10*3/uL (ref 0.0–0.5)
Eosinophils Relative: 3 %
HCT: 30.2 % — ABNORMAL LOW (ref 39.0–52.0)
Hemoglobin: 9.5 g/dL — ABNORMAL LOW (ref 13.0–17.0)
Immature Granulocytes: 0 %
Lymphocytes Relative: 18 %
Lymphs Abs: 0.9 10*3/uL (ref 0.7–4.0)
MCH: 26.4 pg (ref 26.0–34.0)
MCHC: 31.5 g/dL (ref 30.0–36.0)
MCV: 83.9 fL (ref 80.0–100.0)
Monocytes Absolute: 0.5 10*3/uL (ref 0.1–1.0)
Monocytes Relative: 11 %
Neutro Abs: 3.5 10*3/uL (ref 1.7–7.7)
Neutrophils Relative %: 68 %
Platelets: 383 10*3/uL (ref 150–400)
RBC: 3.6 MIL/uL — ABNORMAL LOW (ref 4.22–5.81)
RDW: 16.2 % — ABNORMAL HIGH (ref 11.5–15.5)
WBC: 5.1 10*3/uL (ref 4.0–10.5)
nRBC: 0 % (ref 0.0–0.2)

## 2021-03-09 MED ORDER — CEFDINIR 300 MG PO CAPS: 300.0000 mg | ORAL_CAPSULE | Freq: Two times a day (BID) | ORAL | 0 refills | Status: AC

## 2021-03-09 MED ORDER — CEFDINIR 300 MG PO CAPS
300.0000 mg | ORAL_CAPSULE | Freq: Two times a day (BID) | ORAL | Status: DC
  Administered 2021-03-09: 300 mg via ORAL
  Filled 2021-03-09 (×2): qty 1

## 2021-03-09 MED ORDER — METRONIDAZOLE 500 MG PO TABS: 500.0000 mg | ORAL_TABLET | Freq: Two times a day (BID) | ORAL | 0 refills | Status: AC

## 2021-03-09 MED ORDER — METRONIDAZOLE 500 MG PO TABS
500.0000 mg | ORAL_TABLET | Freq: Two times a day (BID) | ORAL | Status: DC
  Administered 2021-03-09: 500 mg via ORAL
  Filled 2021-03-09: qty 1

## 2021-03-09 MED ORDER — SALINE FLUSH 0.9 % IV SOLN
10.0000 mL | Freq: Every day | INTRAVENOUS | 0 refills | Status: DC
Start: 2021-03-09 — End: 2021-03-20

## 2021-03-09 MED ORDER — HYDROCODONE-ACETAMINOPHEN 5-325 MG PO TABS: 1.0000 | ORAL_TABLET | ORAL | 0 refills | Status: AC | PRN

## 2021-03-09 NOTE — Progress Notes (Signed)
DISCHARGE NOTE HOME Allenmichael Mcpartlin to be discharged Home per MD order. Discussed prescriptions and follow up appointments with the patient. Prescriptions given to patient; medication list explained in detail. Patient verbalized understanding.  Skin clean, dry and intact without evidence of skin break down, no evidence of skin tears noted. IV catheter discontinued intact. Site without signs and symptoms of complications. Dressing and pressure applied. Pt denies pain at the site currently. No complaints noted.  Patient free of lines, drains, and wounds.   An After Visit Summary (AVS) was printed and given to the patient. Patient escorted via wheelchair, and discharged home via private auto.  Arlyss Repress, RN

## 2021-03-09 NOTE — Progress Notes (Signed)
Change augmentin to cefdinir/flagyl for liver abscess per Dr. Verlon Au.  Onnie Boer, PharmD, BCIDP, AAHIVP, CPP Infectious Disease Pharmacist 03/09/2021 9:45 AM

## 2021-03-09 NOTE — Discharge Summary (Signed)
Physician Discharge Summary  Corie Vavra LNL:[id number] DOB: Dec 31, 1945 DOA: 03/04/2021  PCP: Haydee Salter, MD  Admit date: 03/04/2021 Discharge date: 03/09/2021  Time spent: 36 minutes  Recommendations for Outpatient Follow-up:  Needs completion of antibiotics 04/01/2021 Coordination drain study as per IR-they will be contacting the patient C-Met CBC about 1 week Needs TSH in about 3 weeks Limited prescription of opiates prescribed on discharge for severe pain Consider outpatient discussion of glycemic control, initiation of hypoglycemic agents  Discharge Diagnoses:  MAIN problem for hospitalization   Pyogenic liver abscess without sepsis on admission  Please see below for itemized issues addressed in HOpsital- refer to other progress notes for clarity if needed  Discharge Condition: Improved  Diet recommendation: Hypertensive heart healthy  Filed Weights   03/05/21 1617 03/07/21 0601 03/09/21 0655  Weight: 95.4 kg 97.1 kg 96.6 kg    History of present illness:  75 year old white male with prostate cancer status post XRT-complication of bleeding 2/2 bladder issues + BCG November 2021?, prediabetes, obesity BMI 28, HTN   Rx UTI 01/16/2021   35 pound weight loss/8 weeks-fatigue-SOB-diaphoresis prompted return visit to cardiology 02/24/2021- echocardiogram 02/13/2021 noted 3.5 X6.5 echolucent structure extracardiac mass compressing RV - -6/29 CT chest no PE 8 cm mass left liver lobe, 7/5 CT ABD confirm 8.5 X8.4 liver mass suggestive = hepatic cyst?  Hepatic abscess 7/7 IR consulted pigtail placement Gram stain = WBC GPC culture 7/8 all tumor markers were negative includingCA 19-9, CEA, AFP pending   Patient is improving slowly still has drain in place Reached out to IR 7/9 and they will follow the patient in the outpatient setting for outpatient drain study  Hospital Course:  Liver abcess De-escalate to Zosyn to Augmentin 7/10- transitioned to cefdinir and Flagyl on discharge  until 8/3 -IR aware of patient and will follow in drain clinic Very low concern malignancy-CA 19-9/CEA/AFP negative saline lock LR Prostate cancer status post XRT Bladder cancer status post XRT 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 Outpatient follow-up no current issues HTN Coreg 25 twice daily, as needed hydralazine noted Prediabetes CBGs 109 through 148, eating 100% of meals Depression Declining use of Paxil-resume Ativan as per home regimen  Procedures: Pigtail drain placed 7/7  Consultations: Interventional radiology  Discharge Exam: Vitals:   03/09/21 0655 03/09/21 0918  BP: (!) 143/83 132/78  Pulse: (!) 51 (!) 53  Resp: 16 18  Temp: (!) 97.5 F (36.4 C) 98.2 F (36.8 C)  SpO2: 99% 96%    Subj on day of d/c   Awake coherent looks good eating drinking no stool last night no fever no chills no nausea no vomiting no chest pain JP drain discharge and filled up three-quarter way overnight it looks like there was some tissue in it-I voiced to him there is no large concern as long as the drainage is free-flowing  General Exam on discharge  Awake coherent pleasant EOMI NCAT S1-S2 no murmur Abdomen soft drain in place No lower extremity edema Neurologically intact  Discharge Instructions   Discharge Instructions     Diet - low sodium heart healthy   Complete by: As directed    Discharge instructions   Complete by: As directed    Continue all the antibiotics as we have prescribed We have not really changed at the medications that you are on-would recommend that you follow-up with your primary physician in a week and ensure that you are labs remained stable Radiology will be in contact with you to  talk about starting your drain injecting dye and they will tell you when we can remove it I would recommend pain control with Tylenol first choice and Percocet second choice and do not expect he will need more than 1 or 2 days of this High fevers chills sweats and general  malaise should prompt you to call your primary care physician and/or return to the emergency room-this is quite unlikely as you have been on medications by mouth now for over 48 hours and are improving Good luck and have a good summer   Increase activity slowly   Complete by: As directed       Allergies as of 03/09/2021   No Known Allergies      Medication List     TAKE these medications    allopurinol 100 MG tablet Commonly known as: ZYLOPRIM TAKE 1 TABLET(100 MG) BY MOUTH TWICE DAILY   ALPRAZolam 1 MG tablet Commonly known as: XANAX Take 1/2 to 1 tablet up tow twice a day as needed for anxiety. What changed:  how much to take how to take this when to take this reasons to take this additional instructions   carvedilol 25 MG tablet Commonly known as: COREG TAKE 1 TABLET(25 MG) BY MOUTH TWICE DAILY What changed: See the new instructions.   cefdinir 300 MG capsule Commonly known as: OMNICEF Take 1 capsule (300 mg total) by mouth every 12 (twelve) hours for 23 days.   COQ10 PO Take 1 Dose by mouth daily. Liquid form   folic acid 1 MG tablet Commonly known as: FOLVITE Take 1 mg by mouth daily.   HYDROcodone-acetaminophen 5-325 MG tablet Commonly known as: NORCO/VICODIN Take 1-2 tablets by mouth every 4 (four) hours as needed for up to 5 days for moderate pain.   levothyroxine 88 MCG tablet Commonly known as: SYNTHROID Take 1 tablet (88 mcg total) by mouth daily before breakfast.   metroNIDAZOLE 500 MG tablet Commonly known as: FLAGYL Take 1 tablet (500 mg total) by mouth 2 (two) times daily for 23 days.   PARoxetine 10 MG tablet Commonly known as: Paxil Take 1 tablet (10 mg total) by mouth daily. What changed:  when to take this reasons to take this   rosuvastatin 10 MG tablet Commonly known as: CRESTOR TAKE 1 TABLET(10 MG) BY MOUTH DAILY What changed: See the new instructions.       No Known Allergies    The results of significant diagnostics  from this hospitalization (including imaging, microbiology, ancillary and laboratory) are listed below for reference.    Significant Diagnostic Studies: CT ABDOMEN PELVIS W WO CONTRAST  Result Date: 03/03/2021 CLINICAL DATA:  Liver mass identified by prior CT chest angiogram EXAM: CT ABDOMEN AND PELVIS WITHOUT AND WITH CONTRAST TECHNIQUE: Multidetector CT imaging of the abdomen and pelvis was performed following the standard protocol before and following the bolus administration of intravenous contrast. CONTRAST:  188m ISOVUE-300 IOPAMIDOL (ISOVUE-300) INJECTION 61% COMPARISON:  None. FINDINGS: Lower chest: No acute abnormality. Hepatobiliary: There is a large, rim enhancing mass with near fluid internal attenuation arising from the left lobe of the liver and involving or crossing the diaphragm, very closely abutting the right ventricle (series 15, image 56), measuring 8.5 x 8.4 cm, slightly increased in size compared to recent prior examination, at which time it measured 8.1 x 7.9 cm when measured similarly (series 4, image 35, series 17, image 63). There is a component that appears to extend beyond the liver capsule into the gastrohepatic ligament,  tenting the adjacent lesser curvature of the stomach (series 4, image 55). No gallstones, gallbladder wall thickening, or biliary dilatation. Pancreas: Unremarkable. No pancreatic ductal dilatation or surrounding inflammatory changes. Spleen: Normal in size without significant abnormality. Adrenals/Urinary Tract: Adrenal glands are unremarkable. Multiple small bilateral nonobstructive renal calculi. Thickening of the decompressed urinary bladder wall. Stomach/Bowel: Stomach is within normal limits. Appendix appears normal. No evidence of bowel wall thickening, distention, or inflammatory changes. Descending and sigmoid diverticula. Vascular/Lymphatic: Aortic atherosclerosis. Prominent celiac axis, portacaval, and retroperitoneal lymph nodes. Reproductive: Status  post prostatectomy. Penile implant and left lower quadrant reservoir. Other: No abdominal wall hernia or abnormality. No abdominopelvic ascites. Musculoskeletal: No acute or significant osseous findings. IMPRESSION: 1. There is a large, rim enhancing mass with near fluid internal attenuation arising from the left lobe of the liver and involving or crossing the diaphragm and very closely abutting the right ventricle, measuring 8.5 x 8.4 cm, slightly increased in size compared to recent prior examination. There is a component that appears to extend beyond the liver capsule into the gastrohepatic ligament, tenting the adjacent lesser curvature of the stomach. Findings are most suggestive of hepatic abscess. Malignancy with internal necrosis is difficult to exclude. 2. Prominent celiac axis, portacaval, and retroperitoneal lymph nodes, nonspecific and possibly reactive. 3. Status post prostatectomy. Penile implant and left lower quadrant reservoir. 4. Nonobstructive bilateral nephrolithiasis. These results will be called to the ordering clinician or representative by the Radiologist Assistant, and communication documented in the PACS or [employer]. Aortic Atherosclerosis (ICD10-I70.0). Electronically Signed   By: Eddie Candle M.D.   On: 03/03/2021 16:18   DG Chest 2 View  Result Date: 03/04/2021 CLINICAL DATA:  Shortness of breath EXAM: CHEST - 2 VIEW COMPARISON:  01/06/2021 FINDINGS: The heart size and mediastinal contours are within normal limits. Both lungs are clear. The visualized skeletal structures are unremarkable. IMPRESSION: No active cardiopulmonary disease. Electronically Signed   By: Ulyses Jarred M.D.   On: 03/04/2021 19:13   CT Angio Chest Pulmonary Embolism (PE) W or WO Contrast  Result Date: 02/25/2021 CLINICAL DATA:  Clinical concern for pulmonary embolus. EXAM: CT ANGIOGRAPHY CHEST WITH CONTRAST TECHNIQUE: Multidetector CT imaging of the chest was performed using the standard protocol  during bolus administration of intravenous contrast. Multiplanar CT image reconstructions and MIPs were obtained to evaluate the vascular anatomy. CONTRAST:  149m OMNIPAQUE IOHEXOL 350 MG/ML SOLN COMPARISON:  None. FINDINGS: Cardiovascular: Mass lesion identified along the inferior and anterior right heart. Coronary artery calcification is evident. Atherosclerotic calcification is noted in the wall of the thoracic aorta. There is no filling defect within the opacified pulmonary arteries to suggest the presence of an acute pulmonary embolus. Mediastinum/Nodes: No mediastinal lymphadenopathy. There is no hilar lymphadenopathy. The esophagus has normal imaging features. There is no axillary lymphadenopathy. Lungs/Pleura: No suspicious pulmonary nodule or mass. No focal airspace consolidation. No pleural effusion. Upper Abdomen: 7.9 x 6.7 x 7.9 cm low-density mass lesion is identified in the anterior central abdomen involving the anterior aspect of each hemidiaphragm in generating mass-effect on the inferior heart. Given relative sparing of fat planes between the inferior wall of the right heart in the lesion, this is not felt to be cardiac origin but probably arises from segment II of the left liver. Small stones are seen in the upper pole right kidney. 16 mm short axis hepatoduodenal ligament lymph node is seen on 01/30 8/5. Musculoskeletal: No worrisome lytic or sclerotic osseous abnormality. Review of the MIP images confirms the  above findings. IMPRESSION: 1. No CT evidence for acute pulmonary embolus. 2. 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 This could be a primary hepatic neoplasm or metastatic lesion. Abscess considered less likely. Consider abdomen CT with and without contrast plus pelvis CT with contrast to further evaluate.  Multiphase liver protocol suggested. 3. Mild lymphadenopathy in the hepatoduodenal ligament. 4. Tiny nonobstructing right renal stones. 5.  Aortic Atherosclerois (ICD10-170.0) These results will be called to the ordering clinician or representative by the Radiologist Assistant, and communication documented in the PACS or [employer]. Electronically Signed   By: Misty Stanley M.D.   On: 02/25/2021 10:59   [place] Abscess Drain  Result Date: 03/05/2021 INDICATION: 75 year old male with fluid collection within the left liver concerning for abscess EXAM: IMAGE GUIDED DRAINAGE OF LIVER ABSCESS MEDICATIONS: None ANESTHESIA/SEDATION: Fentanyl 1.5 mcg IV; Versed 50 mg IV Moderate Sedation Time:  16 minutes The patient was continuously monitored during the procedure by the interventional radiology nurse under my direct supervision. COMPLICATIONS: None PROCEDURE: Informed written consent was obtained from the patient after a thorough discussion of the procedural risks, benefits and alternatives. All questions were addressed. Maximal Sterile Barrier Technique was utilized including caps, mask, sterile gowns, sterile gloves, sterile drape, hand hygiene and skin antiseptic. A timeout was performed prior to the initiation of the procedure. Patient was positioned supine position on the stretcher. Images of the upper abdomen in the subxiphoid region were stored sent to PACs. The patient was then prepped and draped in the usual sterile fashion. 1% lidocaine was used for local anesthesia. Small stab incision was made with 11 blade scalpel. Twelve French drain was then placed via trocar technique into the abscess of the left liver. Once the catheter was in position we aspirated approximately 130 cc of purulent material. Sample was sent for cytology and for culture. Abscess drain was attached to bulb suction. Drain was sutured in position. Patient tolerated the procedure well and remained hemodynamically stable throughout. No  complications were encountered and no significant blood loss. IMPRESSION: Status post ultrasound-guided drainage of left liver abscess. Signed, Dulcy Fanny. Dellia Nims, RPVI [facility] Electronically Signed   By: Corrie Mckusick D.O.   On: 03/05/2021 19:26   CT CORONARY MORPH W/CTA COR W/SCORE W/CA W/CM &/OR WO/CM  Addendum Date: 02/25/2021   ADDENDUM REPORT: 02/25/2021 13:21 CLINICAL DATA:  75 Year-old White Male EXAM: Cardiac/Coronary  CTA TECHNIQUE: The patient was scanned on a [employer]. FINDINGS: A 100 kV prospective scan was triggered in the descending thoracic aorta at 111 HU's. Axial non-contrast 3 mm slices were carried out through the heart. The data set was analyzed on a dedicated work station and scored using the [place]. Gantry rotation speed was 250 msecs and collimation was .6 mm. No beta blockade and 0.8 mg of sl NTG was given. The 3D data set was reconstructed in 5% intervals of the 67-82 % of the R-R cycle. Diastolic phases were analyzed on a dedicated work station using MPR, MIP and VRT modes. The patient received 150 cc of contrast. Aorta:  Normal size.  Aortic atherosclerosis noted.  No dissection. Main Pulmonary Artery: Normal size of the pulmonary artery. Aortic Valve: Tri-leaflet. Aortic valve calcium score of 352 (non-severe). Coronary Arteries:  Normal coronary origin.  Right dominance. Coronary Calcium Score: Left main: 25 Left anterior descending artery: 191 Left circumflex artery: 161 Right coronary artery: 101 Total: 478 Percentile: 63rd for age, sex, and race matched control. RCA is a large dominant artery that gives rise to PDA and PLA. There is a mild non-obstructive (25-49%) and a minimal non obstructive (1-24%) calcified plaque in the proximal RCA. There are minimal non obstructive (1-24%) calcified plaques in the mid RCA. There is a mild non-obstructive (25-49%) small vessel calcified plaque in the  distal tip of the RCA. Left main is a large artery that gives rise to LAD and LCX arteries. There is a < 10% calcified plaque in the proximal vessel. LAD is a large vessel that gives rise to two diagonal vessels. There is a minimal non obstructive (1-24%) calcified plaque in the proximal LAD. There is a mild non-obstructive (25-49%) calcified plaque in the mid LAD, followed by a minimal non obstructive (1-24%) calcified plaque. There are minimal non obstructive (1-24%) calcified plaques in the distal LAD LCX is a non-dominant artery that gives rise to one and OM1 vessel and an OM2 branch. There is a mild non-obstructive (25-49%) calcified plaque in the proximal LCX. There is a mild non-obstructive (25-49%) calcified plaque in the OM2 vessel. Other findings: Normal variant pulmonary vein drainage into the left atrium: Left common pulmonary vein. Normal left atrial appendage without a thrombus. There is a 79 X 62 X 79 mm low density mass adjacent to the inferior portion of the right ventricle. No evidence intracardiac communication. Hounsfield Units of 45 are less consistent with a cystic origin. Extra-cardiac findings: See attached radiology report for non-cardiac structures. IMPRESSION: 1. Coronary calcium score of 478. This was 63rd percentile for age, sex, and race matched control. 2. Normal coronary origin with right dominance. 3. Aortic atherosclerosis noted. 4. Aortic valve calcium score of 352 (non-severe). 5. CAD-RADS 2. Mild non-obstructive CAD (25-49%). Consider non-atherosclerotic causes of chest pain. Consider preventive therapy and risk factor modification. 6. There is a 79 X 62 X 79 mm low density mass adjacent to the inferior portion of the right ventricle. No evidence intracardiac communication. Hounsfield Units of 45 are less consistent with a cystic origin. See radiology report for more detail. Discussed with primary cardiologist. RECOMMENDATIONS: Coronary artery calcium (CAC) score is a strong  predictor of incident coronary heart disease (CHD) and provides predictive information beyond traditional risk factors. CAC scoring is reasonable to use in the decision to withhold, postpone, or initiate statin therapy in intermediate-risk or selected borderline-risk asymptomatic adults (age 12-75 years and LDL-C >=70 to <190 mg/dL) who do not have diabetes or established atherosclerotic cardiovascular disease (ASCVD).* In intermediate-risk (10-year ASCVD risk >=7.5% to <20%) adults or selected borderline-risk (10-year ASCVD risk >=5% to <7.5%) adults in whom a CAC score is measured for the purpose of making a treatment decision the following recommendations have been made: If CAC = 0, it is reasonable to withhold statin therapy and reassess in 5 to 10 years, as long as higher risk conditions are absent (diabetes mellitus, family history of premature CHD in first degree relatives (males <55 years; females <65 years), cigarette smoking, LDL >=190 mg/dL or other independent risk factors). If CAC is 1 to 99, it is reasonable to initiate statin therapy for patients ?75 years of age. If CAC is >=100 or >=75th percentile, it is reasonable to initiate statin therapy at any age. Cardiology referral should be considered for patients with CAC scores =400 or >=75th percentile. *2018  AHA/ACC/AACVPR/AAPA/ABC/ACPM/ADA/AGS/APhA/ASPC/NLA/PCNA Guideline on the Management of Blood Cholesterol: A Report of the [employer] of Cardiology/American Heart Association Task Force on Clinical Practice Guidelines. J Am Coll Cardiol. 2019;73(24):3168-3209. Rudean Haskell, MD Electronically Signed   By: Rudean Haskell MD   On: 02/25/2021 13:21   Result Date: 02/25/2021 EXAM: OVER-READ INTERPRETATION  CT CHEST The following report is an over-read performed by radiologist Dr. Misty Stanley of [facility], [place] on 02/25/2021. This over-read does not include interpretation of cardiac or coronary anatomy or pathology. The  Coronary CTA interpretation by the cardiologist is attached. COMPARISON:  None. FINDINGS: Necrotic mass lesion identified between the right heart and segment II of the left liver, suspicious for liver origin. Please see dedicated CTA Chest exam performed at the same time. IMPRESSION: Necrotic mass lesion identified between the right heart and segment II of the left liver, suspicious for liver origin. For full chest findings please see report for pulmonary artery CTA Chest exam performed at the same time. Electronically Signed: By: Misty Stanley M.D. On: 02/25/2021 11:02   ECHOCARDIOGRAM COMPLETE  Result Date: 02/13/2021    ECHOCARDIOGRAM REPORT   Patient Name:   MONICO SUDDUTH Date of Exam: 02/13/2021 Medical Rec #:  [id number]    Height:       72.0 in Accession #:    [id number]   Weight:       221.4 lb Date of Birth:  1946-03-26     BSA:          2.225 m Patient Age:    52 years     BP:           132/73 mmHg Patient Gender: M            HR:           64 bpm. Exam Location:  Outpatient Procedure: 2D Echo, Color Doppler and Cardiac Doppler Indications:    R06.9 DOE  History:        Patient has no prior history of Echocardiogram examinations.                 Risk Factors:Hypertension and Dyslipidemia.  Sonographer:    Raquel Sarna Senior RDCS Referring Phys: [id number] [place]  1. Left ventricular ejection fraction, by estimation, is 60 to 65%. The left ventricle has normal function. The left ventricle has no regional wall motion abnormalities. Left ventricular diastolic parameters are consistent with Grade I diastolic dysfunction (impaired relaxation).  2. Right ventricular systolic function is mildly reduced. The right ventricular size is normal.  3. 3.5 x 6.5 cm echolucent oval structure adjacent to the posterior wall -apperas extracardia - seems to compress the RV some. Consider pericardial cyst, hiatal hernia or other extracardiac masses such as hepatic mass/cyst - cross-sectional imaging such  as CT  may be helpful.  4. The mitral valve is abnormal. Trivial mitral valve regurgitation.  5. The aortic valve is calcified. Aortic valve regurgitation is not visualized. Mild aortic valve stenosis. Aortic valve area, by VTI measures 1.71 cm. Aortic valve mean gradient measures 13.0 mmHg. Aortic valve Vmax measures 2.41 m/s. Peak gradient 23.2 mmHg. DI is 0.54.  6. The inferior vena cava is normal in size with greater than 50% respiratory variability, suggesting right atrial pressure of 3 mmHg. Conclusion(s)/Recommendation(s): Extracardiac echolucent structure - recommend additional cross-sectional imaging. Critical findings reported to Dr. Margaretann Loveless and acknowledged at 4:40 pm on 02/13/2021. FINDINGS  Left Ventricle: Left ventricular ejection fraction, by estimation, is 60 to 65%. The  left ventricle has normal function. The left ventricle has no regional wall motion abnormalities. Definity contrast agent was given IV to delineate the left ventricular  endocardial borders. The left ventricular internal cavity size was normal in size. There is no left ventricular hypertrophy. Left ventricular diastolic parameters are consistent with Grade I diastolic dysfunction (impaired relaxation). Indeterminate filling pressures. Right Ventricle: The right ventricular size is normal. No increase in right ventricular wall thickness. Right ventricular systolic function is mildly reduced. Left Atrium: Left atrial size was normal in size. Right Atrium: Right atrial size was normal in size. Pericardium: 3.5 x 6.5 cm echolucent oval structure adjacent to the posterior wall -apperas extracardia - seems to compress the RV some. Consider pericardial cyst, hiatal hernia or other extracardiac masses such as hepatic mass/cyst - cross-sectional imaging such as CT may be helpful. There is no evidence of pericardial effusion. Mitral Valve: The mitral valve is abnormal. There is mild thickening of the mitral valve leaflet(s). Mild mitral annular  calcification. Trivial mitral valve regurgitation. Tricuspid Valve: The tricuspid valve is grossly normal. Tricuspid valve regurgitation is not demonstrated. Aortic Valve: The aortic valve is calcified. Aortic valve regurgitation is not visualized. Mild aortic stenosis is present. Aortic valve mean gradient measures 13.0 mmHg. Aortic valve peak gradient measures 23.2 mmHg. Aortic valve area, by VTI measures 1.71 cm. Pulmonic Valve: The pulmonic valve was not well visualized. Pulmonic valve regurgitation is not visualized. Aorta: The aortic root and ascending aorta are structurally normal, with no evidence of dilitation. Venous: The inferior vena cava is normal in size with greater than 50% respiratory variability, suggesting right atrial pressure of 3 mmHg. IAS/Shunts: No atrial level shunt detected by color flow Doppler.  LEFT VENTRICLE PLAX 2D LVIDd:         4.30 cm  Diastology LVIDs:         3.10 cm  LV e' medial:    6.96 cm/s LV PW:         0.90 cm  LV E/e' medial:  12.0 LV IVS:        1.00 cm  LV e' lateral:   12.80 cm/s LVOT diam:     2.00 cm  LV E/e' lateral: 6.5 LV SV:         80 LV SV Index:   36 LVOT Area:     3.14 cm  RIGHT VENTRICLE RV S prime:     7.07 cm/s TAPSE (M-mode): 1.5 cm LEFT ATRIUM             Index       RIGHT ATRIUM           Index LA diam:        2.30 cm 1.03 cm/m  RA Area:     10.80 cm LA Vol (A2C):   52.7 ml 23.69 ml/m RA Volume:   22.60 ml  10.16 ml/m LA Vol (A4C):   56.4 ml 25.35 ml/m LA Biplane Vol: 58.5 ml 26.30 ml/m  AORTIC VALVE AV Area (Vmax):    1.50 cm AV Area (Vmean):   1.62 cm AV Area (VTI):     1.71 cm AV Vmax:           241.00 cm/s AV Vmean:          167.000 cm/s AV VTI:            0.469 m AV Peak Grad:      23.2 mmHg AV Mean Grad:      13.0 mmHg  LVOT Vmax:         115.00 cm/s LVOT Vmean:        86.300 cm/s LVOT VTI:          0.255 m LVOT/AV VTI ratio: 0.54  AORTA Ao Root diam: 3.70 cm MITRAL VALVE MV Area (PHT): 2.00 cm     SHUNTS MV Decel Time: 379 msec      Systemic VTI:  0.26 m MV E velocity: 83.30 cm/s   Systemic Diam: 2.00 cm MV A velocity: 101.00 cm/s MV E/A ratio:  0.82 Lyman Bishop MD Electronically signed by Lyman Bishop MD Signature Date/Time: 02/13/2021/4:46:44 PM    Final     Microbiology: Recent Results (from the past 240 hour(s))  Resp Panel by RT-PCR (Flu A&B, Covid) Nasopharyngeal Swab     Status: None   Collection Time: 03/05/21  9:28 AM   Specimen: Nasopharyngeal Swab; Nasopharyngeal(NP) swabs in vial transport medium  Result Value Ref Range Status   SARS Coronavirus 2 by RT PCR NEGATIVE NEGATIVE Final    Comment: (NOTE) SARS-CoV-2 target nucleic acids are NOT DETECTED.  The SARS-CoV-2 RNA is generally detectable in upper respiratory specimens during the acute phase of infection. The lowest concentration of SARS-CoV-2 viral copies this assay can detect is 138 copies/mL. A negative result does not preclude SARS-Cov-2 infection and should not be used as the sole basis for treatment or other patient management decisions. A negative result may occur with  improper specimen collection/handling, submission of specimen other than nasopharyngeal swab, presence of viral mutation(s) within the areas targeted by this assay, and inadequate number of viral copies(<138 copies/mL). A negative result must be combined with clinical observations, patient history, and epidemiological information. The expected result is Negative.  Fact Sheet for Patients:  EntrepreneurPulse.com.au  Fact Sheet for Healthcare Providers:  IncredibleEmployment.be  This test is no t yet approved or cleared by the [place] FDA and  has been authorized for detection and/or diagnosis of SARS-CoV-2 by FDA under an Emergency Use Authorization (EUA). This EUA will remain  in effect (meaning this test can be used) for the duration of the COVID-19 declaration under Section 564(b)(1) of the Act, 21 U.S.C.section 360bbb-3(b)(1),  unless the authorization is terminated  or revoked sooner.       Influenza A by PCR NEGATIVE NEGATIVE Final   Influenza B by PCR NEGATIVE NEGATIVE Final    Comment: (NOTE) The Xpert Xpress SARS-CoV-2/FLU/RSV plus assay is intended as an aid in the diagnosis of influenza from Nasopharyngeal swab specimens and should not be used as a sole basis for treatment. Nasal washings and aspirates are unacceptable for Xpert Xpress SARS-CoV-2/FLU/RSV testing.  Fact Sheet for Patients: EntrepreneurPulse.com.au  Fact Sheet for Healthcare Providers: IncredibleEmployment.be  This test is not yet approved or cleared by the [place] FDA and has been authorized for detection and/or diagnosis of SARS-CoV-2 by FDA under an Emergency Use Authorization (EUA). This EUA will remain in effect (meaning this test can be used) for the duration of the COVID-19 declaration under Section 564(b)(1) of the Act, 21 U.S.C. section 360bbb-3(b)(1), unless the authorization is terminated or revoked.  Performed at [facility], [place] [address] [place], [place] [address]   Blood culture (routine x 2)     Status: None (Preliminary result)   Collection Time: 03/05/21  9:33 AM   Specimen: BLOOD  Result Value Ref Range Status   Specimen Description BLOOD SITE NOT SPECIFIED  Final   Special Requests   Final  BOTTLES DRAWN AEROBIC AND ANAEROBIC Blood Culture adequate volume   Culture   Final    NO GROWTH 4 DAYS Performed at [facility], [place] [address], [place], [place] [address]    Report Status PENDING  Incomplete  Blood culture (routine x 2)     Status: None (Preliminary result)   Collection Time: 03/05/21  9:57 AM   Specimen: BLOOD  Result Value Ref Range Status   Specimen Description BLOOD SITE NOT SPECIFIED  Final   Special Requests   Final    BOTTLES DRAWN AEROBIC AND ANAEROBIC Blood Culture adequate volume   Culture   Final    NO GROWTH 4  DAYS Performed at [facility], [address], [place], [place] [address]    Report Status PENDING  Incomplete  Aerobic/Anaerobic Culture w Gram Stain (surgical/deep wound)     Status: None (Preliminary result)   Collection Time: 03/05/21  1:30 PM   Specimen: Abscess  Result Value Ref Range Status   Specimen Description ABSCESS  Final   Special Requests LIVER  Final   Gram Stain   Final    ABUNDANT WBC PRESENT,BOTH PMN AND MONONUCLEAR ABUNDANT GRAM POSITIVE COCCI MODERATE GRAM VARIABLE ROD    Culture   Final    NO GROWTH 3 DAYS HOLDING FOR POSSIBLE ANAEROBE Performed at [facility], [place] [address], [place], [place] [address]    Report Status PENDING  Incomplete     Labs: Basic Metabolic Panel: Recent Labs  Lab 03/04/21 1810 03/06/21 0423 03/07/21 0912  NA 131* 133* 138  K 4.1 3.7 3.6  CL 98 100 105  CO2 '23 25 24  ' GLUCOSE 110* 109* 148*  BUN '17 19 12  ' CREATININE 1.16 1.23 1.12  CALCIUM 10.3 10.1 10.0   Liver Function Tests: Recent Labs  Lab 03/04/21 1810 03/07/21 0912  AST 28 32  ALT 20 26  ALKPHOS 107 87  BILITOT 0.7 0.5  PROT 7.6 6.4*  ALBUMIN 2.5* 2.2*   No results for input(s): LIPASE, AMYLASE in the last 168 hours. No results for input(s): AMMONIA in the last 168 hours. CBC: Recent Labs  Lab 03/04/21 1810 03/06/21 0423 03/07/21 0912  WBC 11.8* 7.7 3.8*  NEUTROABS 9.6*  --  2.3  HGB 11.0* 10.3* 9.8*  HCT 34.6* 31.4* 30.5*  MCV 83.2 82.2 82.4  PLT 513* 443* 379   Cardiac Enzymes: No results for input(s): CKTOTAL, CKMB, CKMBINDEX, TROPONINI in the last 168 hours. BNP: BNP (last 3 results) No results for input(s): BNP in the last 8760 hours.  ProBNP (last 3 results) Recent Labs    02/11/21 0906  PROBNP 147.0*    CBG: No results for input(s): GLUCAP in the last 168 hours.     Signed:  Nita Sells MD   [facility] 03/09/2021, 9:57 AM

## 2021-03-09 NOTE — TOC Transition Note (Signed)
Transition of Care [facility]) - CM/SW Discharge Note   Patient Details  Name: [name] MRN: [id number] Date of Birth: February 06, 1946  [facility] Care [facility]) CM/SW Contact:  Bartholomew Crews, RN Phone Number: [phone] 03/09/2021, 10:59 AM   Clinical Narrative:     Spoke with patient at the bedside. PTA home with wife. Son lives at the same residence, and daughter is currently in town visiting from [place]. Discussed recommendation for [facility] RN to monitor JP drain and assessment of infection/antibiotics. Patient agreeable. Choice of home health agency offered. [facility] accepted referral. Spouse to provide transportation home this afternoon. No further TOC needs identified.   Final next level of care: Home w Home Health Services Barriers to Discharge: No Barriers Identified   Patient Goals and CMS Choice Patient states their goals for this hospitalization and ongoing recovery are:: return home with wife CMS Medicare.gov Compare Post Acute Care list provided to:: Patient Choice offered to / list presented to : Patient  Discharge Placement                       Discharge Plan and Services                DME Arranged: N/A DME Agency: NA       HH Arranged: RN Economy Agency: [place] Date [facility] Agency Contacted: 03/09/21 Time [place]: 5520 Representative spoke with at [place]: [place] ([place]) Interventions     Readmission Risk Interventions No flowsheet data found.

## 2021-03-10 ENCOUNTER — Other Ambulatory Visit: Payer: Self-pay | Admitting: Family Medicine

## 2021-03-10 DIAGNOSIS — K750 Abscess of liver: Secondary | ICD-10-CM

## 2021-03-10 LAB — CULTURE, BLOOD (ROUTINE X 2)
Culture: NO GROWTH
Culture: NO GROWTH
Special Requests: ADEQUATE
Special Requests: ADEQUATE

## 2021-03-10 LAB — AEROBIC/ANAEROBIC CULTURE W GRAM STAIN (SURGICAL/DEEP WOUND)

## 2021-03-11 ENCOUNTER — Ambulatory Visit (HOSPITAL_COMMUNITY): Payer: Medicare Other

## 2021-03-11 ENCOUNTER — Telehealth: Payer: Self-pay | Admitting: *Deleted

## 2021-03-11 NOTE — Telephone Encounter (Signed)
Transition Care Management Follow-up Telephone Call Date of discharge and from where: [facility] How have you been since you were released from the hospital? Feeling better feels like antibiotics may him lightheaded.  Is going to space out taking medication if does not subside he will call PCP  Any questions or concerns? Yes  Items Reviewed: Did the pt receive and understand the discharge instructions provided? Yes  Medications obtained and verified? Yes  Other? No  Any new allergies since your discharge? No  Dietary orders reviewed? Yes Do you have support at home? Yes   Home Care and Equipment/Supplies: Were home health services ordered? yes If so, what is the name of the agency?   Has the agency set up a time to come to the patient's home? yes Were any new equipment or medical supplies ordered?   What is the name of the medical supply agency?  Were you able to get the supplies/equipment? yes Do you have any questions related to the use of the equipment or supplies? No  Functional Questionnaire: (I = Independent and D = Dependent) ADLs: I  Bathing/Dressing- I  Meal Prep- I  Eating- I  Maintaining continence- I  Transferring/Ambulation- I  Managing Meds- I  Follow up appointments reviewed:  PCP Hospital f/u appt confirmed? Yes  Scheduled to see Dr. Gena Fray on 7-22 @ 11:30. [facility] f/u appt confirmed? Yes  Scheduled to see Dr. Haroldine Laws on 03-19-2021 @ 11:30. Are transportation arrangements needed? No  If their condition worsens, is the pt aware to call PCP or go to the Emergency Dept.? Yes Was the patient provided with contact information for the PCP's office or ED? Yes Was to pt encouraged to call back with questions or concerns? Yes

## 2021-03-12 ENCOUNTER — Telehealth: Payer: Self-pay

## 2021-03-12 NOTE — Telephone Encounter (Signed)
[name] with [facility] is calling to get a verbal authorization for home health visits due to patient being discharged with a JP drain  Heathers call back # is [phone]  Thank you

## 2021-03-12 NOTE — Telephone Encounter (Signed)
Spoke to [employer] w/[facility], gave verbal orders for once a week, then twice a week, and back to once a week if things are going well.Dm/cma

## 2021-03-19 ENCOUNTER — Ambulatory Visit
Admission: RE | Admit: 2021-03-19 | Discharge: 2021-03-19 | Disposition: A | Discharge status: Home / Self Care | Payer: Medicare Other | Source: Ambulatory Visit | Attending: Student | Admitting: Student

## 2021-03-19 ENCOUNTER — Ambulatory Visit
Admission: RE | Admit: 2021-03-19 | Discharge: 2021-03-19 | Disposition: A | Discharge status: Home / Self Care | Payer: Medicare Other | Source: Ambulatory Visit | Attending: Family Medicine | Admitting: Family Medicine

## 2021-03-19 ENCOUNTER — Encounter: Payer: Self-pay | Admitting: *Deleted

## 2021-03-19 DIAGNOSIS — K750 Abscess of liver: Secondary | ICD-10-CM

## 2021-03-19 HISTORY — PX: IR RADIOLOGIST EVAL & MGMT: IMG5224

## 2021-03-19 MED ORDER — IOPAMIDOL (ISOVUE-300) INJECTION 61%
100.0000 mL | Freq: Once | INTRAVENOUS | Status: AC | PRN
  Administered 2021-03-19: 100 mL via INTRAVENOUS

## 2021-03-19 NOTE — Progress Notes (Signed)
Chief Complaint: Patient was seen in consultation today for hepatic abscess drain follow-up   Referring Physician(s): Dr. Lorin Mercy, J.  Supervising Physician: Arne Cleveland  History of Present Illness: [name] is a 75 y.o. male with past medical history of HTN, hypothyroidism, arthritis, prostate cancer s/p radiation therapy who presented to [facility] ED 03/04/21 with chief complaint of shortness of breath and epigastric discomfort.  Prior to the presentation to ED, patient underwent CT chest for evaluation of dyspnea per his cardiologist which showed incidental finding of 8 cm hepatic mass. Subsequent CT abdomen on 03/03/21 showed findings are most suggestive of hepatic abscess, malignancy with internal necrosis is difficult to exclude.  Patient presented to [facility] ED on 03/04/2021, IR was requested for possible drain placement for the hepatic fluid collection and patient underwent a drain placement with Dr. Earleen Newport on 03/05/2021.  Patient was discharged on 03/09/2021 in stable condition with IR follow-up for the hepatic abscess drain.  Patient  presents today for follow-up CT scan and possible drain injection.   Patient reports no abdominal complaints, states that the drain is working ok but OP is less than 5cc/day.Patient is flushing his drain with 5 cc NS twice a day.  Currently on abx.  Has appointment with his surgeon on 03/20/21.    Past Medical History:  Diagnosis Date   Arthritis    knees. 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  Medications: Prior to Admission medications   Medication Sig Start Date End Date Taking? Authorizing Provider  allopurinol (ZYLOPRIM) 100 MG tablet TAKE 1 TABLET(100 MG) BY MOUTH TWICE DAILY 10/31/20   Haydee Salter, MD  ALPRAZolam Duanne Moron) 1 MG tablet Take 1/2 to 1 tablet up tow twice a day as needed for anxiety. 02/27/21   Haydee Salter, MD  carvedilol (COREG) 25 MG tablet TAKE 1 TABLET(25 MG) BY MOUTH TWICE DAILY 07/16/20   Dutch Quint B, FNP  cefdinir (OMNICEF) 300 MG capsule Take 1 capsule (300 mg total) by mouth every 12 (twelve) hours for 23 days. 03/09/21 04/01/21  Nita Sells, MD  Coenzyme Q10 (COQ10 PO) Take 1 Dose by mouth daily. Liquid form    [provider]  folic acid (FOLVITE) 1 MG tablet Take 1 mg by mouth daily.    [provider]  levothyroxine (SYNTHROID) 88 MCG tablet Take 1 tablet (88 mcg total) by mouth daily before breakfast. 11/18/20   Haydee Salter, MD  metroNIDAZOLE (FLAGYL) 500 MG tablet Take 1 tablet (500 mg total) by mouth 2 (two) times daily for 23 days. 03/09/21 04/01/21  Nita Sells, MD  PARoxetine (PAXIL) 10 MG tablet Take 1 tablet (10 mg total) by mouth daily. 02/27/21   Haydee Salter, MD  rosuvastatin (CRESTOR) 10 MG tablet TAKE 1 TABLET(10 MG) BY MOUTH DAILY 05/01/20  Libby Maw, MD  Sodium Chloride Flush (SALINE FLUSH) 0.9 % SOLN Place 10 mLs into feeding tube daily. 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  Tobacco Use   Smoking status: Never   Smokeless tobacco: Never   Vaping Use   Vaping Use: Former   Start date: 08/01/2017   Substances: THC  Substance and Sexual Activity   Alcohol use: Yes    Alcohol/week: 2.0 standard drinks    Types: 2 Standard drinks or equivalent per week   Drug use: Not Currently    Types: Marijuana    Comment: none in at least 1 year   Sexual activity: Yes  Other Topics Concern   Not on file  Social History Narrative   Not on file   Social Determinants of Health   Financial Resource Strain: Not on file  Food Insecurity: Not on file  Transportation Needs: Not on file  Physical Activity: Not on file  Stress: Not on file  Social Connections: Not on file     Review of Systems: A 12 point ROS discussed and pertinent positives are indicated in the HPI above.  All other systems are negative.   Vital Signs: There were no vitals taken for this visit.  BP 134/60, Temp 98 F, HR 65, O2 100% RA   Physical Exam Vitals reviewed.  Constitutional:      General: He is not in acute distress.    Appearance: Normal appearance. He is not ill-appearing.  HENT:     Head: Normocephalic and atraumatic.  Pulmonary:     Effort: Pulmonary effort is normal.  Abdominal:     General: Abdomen is flat.     Palpations: Abdomen is soft.  Musculoskeletal:     Cervical back: Neck supple.  Skin:    General: Skin is warm and dry.     Coloration: Skin is not jaundiced or pale.  Neurological:     Mental Status: He is alert and oriented to person, place, and time.  Psychiatric:        Mood and Affect: Mood normal.        Behavior: Behavior normal.        Judgment: Judgment normal.    Mallampati Score:     Imaging: CT ABDOMEN PELVIS W WO CONTRAST  Result Date: 03/03/2021 CLINICAL DATA:  Liver mass identified by prior CT chest angiogram EXAM: CT ABDOMEN AND PELVIS WITHOUT AND WITH CONTRAST TECHNIQUE: Multidetector CT imaging of the abdomen and pelvis was performed following the standard protocol before and following the bolus  administration of intravenous contrast. CONTRAST:  125mL ISOVUE-300 IOPAMIDOL (ISOVUE-300) INJECTION 61% COMPARISON:  None. FINDINGS: Lower chest: No acute abnormality. Hepatobiliary: There is a large, rim enhancing mass with near fluid internal attenuation arising from the left lobe of the liver and involving or crossing the diaphragm, very closely abutting the right ventricle (series 15, image 56), measuring 8.5 x 8.4 cm, slightly increased in size compared to recent prior examination, at which time it measured 8.1 x 7.9 cm when measured similarly (series 4, image 35,  series 17, image 63). There is a component that appears to extend beyond the liver capsule into the gastrohepatic ligament, tenting the adjacent lesser curvature of the stomach (series 4, image 55). No gallstones, gallbladder wall thickening, or biliary dilatation. Pancreas: Unremarkable. No pancreatic ductal dilatation or surrounding inflammatory changes. Spleen: Normal in size without significant abnormality. Adrenals/Urinary Tract: Adrenal glands are unremarkable. Multiple small bilateral nonobstructive renal calculi. Thickening of the decompressed urinary bladder wall. Stomach/Bowel: Stomach is within normal limits. Appendix appears normal. No evidence of bowel wall thickening, distention, or inflammatory changes. Descending and sigmoid diverticula. Vascular/Lymphatic: Aortic atherosclerosis. Prominent celiac axis, portacaval, and retroperitoneal lymph nodes. Reproductive: Status post prostatectomy. Penile implant and left lower quadrant reservoir. Other: No abdominal wall hernia or abnormality. No abdominopelvic ascites. Musculoskeletal: No acute or significant osseous findings. IMPRESSION: 1. There is a large, rim enhancing mass with near fluid internal attenuation arising from the left lobe of the liver and involving or crossing the diaphragm and very closely abutting the right ventricle, measuring 8.5 x 8.4 cm, slightly increased in size  compared to recent prior examination. There is a component that appears to extend beyond the liver capsule into the gastrohepatic ligament, tenting the adjacent lesser curvature of the stomach. Findings are most suggestive of hepatic abscess. Malignancy with internal necrosis is difficult to exclude. 2. Prominent celiac axis, portacaval, and retroperitoneal lymph nodes, nonspecific and possibly reactive. 3. Status post prostatectomy. Penile implant and left lower quadrant reservoir. 4. Nonobstructive bilateral nephrolithiasis. These results will be called to the ordering clinician or representative by the Radiologist Assistant, and communication documented in the PACS or [employer]. Aortic Atherosclerosis (ICD10-I70.0). Electronically Signed   By: Eddie Candle M.D.   On: 03/03/2021 16:18   DG Chest 2 View  Result Date: 03/04/2021 CLINICAL DATA:  Shortness of breath EXAM: CHEST - 2 VIEW COMPARISON:  01/06/2021 FINDINGS: The heart size and mediastinal contours are within normal limits. Both lungs are clear. The visualized skeletal structures are unremarkable. IMPRESSION: No active cardiopulmonary disease. Electronically Signed   By: Ulyses Jarred M.D.   On: 03/04/2021 19:13   CT Angio Chest Pulmonary Embolism (PE) W or WO Contrast  Result Date: 02/25/2021 CLINICAL DATA:  Clinical concern for pulmonary embolus. EXAM: CT ANGIOGRAPHY CHEST WITH CONTRAST TECHNIQUE: Multidetector CT imaging of the chest was performed using the standard protocol during bolus administration of intravenous contrast. Multiplanar CT image reconstructions and MIPs were obtained to evaluate the vascular anatomy. CONTRAST:  192mL OMNIPAQUE IOHEXOL 350 MG/ML SOLN COMPARISON:  None. FINDINGS: Cardiovascular: Mass lesion identified along the inferior and anterior right heart. Coronary artery calcification is evident. Atherosclerotic calcification is noted in the wall of the thoracic aorta. There is no filling defect within the opacified  pulmonary arteries to suggest the presence of an acute pulmonary embolus. Mediastinum/Nodes: No mediastinal lymphadenopathy. There is no hilar lymphadenopathy. The esophagus has normal imaging features. There is no axillary lymphadenopathy. Lungs/Pleura: No suspicious pulmonary nodule or mass. No focal airspace consolidation. No pleural effusion. Upper Abdomen: 7.9 x 6.7 x 7.9 cm low-density mass lesion is identified in the anterior central abdomen involving the anterior aspect of each hemidiaphragm in generating mass-effect on the inferior heart. Given relative sparing of fat planes between the inferior wall of the right heart in the lesion, this is not felt to be cardiac origin but probably arises from segment II of the left liver. Small stones are seen in the upper pole right kidney. 16 mm short axis hepatoduodenal ligament lymph node  is seen on 01/30 8/5. Musculoskeletal: No worrisome lytic or sclerotic osseous abnormality. Review of the MIP images confirms the above findings. IMPRESSION: 1. No CT evidence for acute pulmonary embolus. 2. 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 This could be a primary hepatic neoplasm or metastatic lesion. Abscess considered less likely. Consider abdomen CT with and without contrast plus pelvis CT with contrast to further evaluate. Multiphase liver protocol suggested. 3. Mild lymphadenopathy in the hepatoduodenal ligament. 4. Tiny nonobstructing right renal stones. 5.  Aortic Atherosclerois (ICD10-170.0) These results will be called to the ordering clinician or representative by the Radiologist Assistant, and communication documented in the PACS or [employer]. Electronically Signed   By: Misty Stanley M.D.   On: 02/25/2021 10:59   [place] Abscess Drain  Result Date: 03/05/2021 INDICATION:  75 year old male with fluid collection within the left liver concerning for abscess EXAM: IMAGE GUIDED DRAINAGE OF LIVER ABSCESS MEDICATIONS: None ANESTHESIA/SEDATION: Fentanyl 1.5 mcg IV; Versed 50 mg IV Moderate Sedation Time:  16 minutes The patient was continuously monitored during the procedure by the interventional radiology nurse under my direct supervision. COMPLICATIONS: None PROCEDURE: Informed written consent was obtained from the patient after a thorough discussion of the procedural risks, benefits and alternatives. All questions were addressed. Maximal Sterile Barrier Technique was utilized including caps, mask, sterile gowns, sterile gloves, sterile drape, hand hygiene and skin antiseptic. A timeout was performed prior to the initiation of the procedure. Patient was positioned supine position on the stretcher. Images of the upper abdomen in the subxiphoid region were stored sent to PACs. The patient was then prepped and draped in the usual sterile fashion. 1% lidocaine was used for local anesthesia. Small stab incision was made with 11 blade scalpel. Twelve French drain was then placed via trocar technique into the abscess of the left liver. Once the catheter was in position we aspirated approximately 130 cc of purulent material. Sample was sent for cytology and for culture. Abscess drain was attached to bulb suction. Drain was sutured in position. Patient tolerated the procedure well and remained hemodynamically stable throughout. No complications were encountered and no significant blood loss. IMPRESSION: Status post ultrasound-guided drainage of left liver abscess. Signed, Dulcy Fanny. Dellia Nims, RPVI [facility] Electronically Signed   By: Corrie Mckusick D.O.   On: 03/05/2021 19:26   CT CORONARY MORPH W/CTA COR W/SCORE W/CA W/CM &/OR WO/CM  Addendum Date: 02/25/2021   ADDENDUM REPORT: 02/25/2021 13:21 CLINICAL DATA:  75 Year-old White Male  EXAM: Cardiac/Coronary  CTA TECHNIQUE: The patient was scanned on a [employer]. FINDINGS: A 100 kV prospective scan was triggered in the descending thoracic aorta at 111 HU's. Axial non-contrast 3 mm slices were carried out through the heart. The data set was analyzed on a dedicated work station and scored using the Rodriguez Hevia. Gantry rotation speed was 250 msecs and collimation was .6 mm. No beta blockade and 0.8 mg of sl NTG was given. The 3D data set was reconstructed in 5% intervals of the 67-82 % of the R-R cycle. Diastolic phases were analyzed on a dedicated work station using MPR, MIP and VRT modes. The patient received 150 cc of contrast. Aorta:  Normal size.  Aortic atherosclerosis noted.  No dissection. Main Pulmonary Artery: Normal size of  the pulmonary artery. Aortic Valve: Tri-leaflet. Aortic valve calcium score of 352 (non-severe). Coronary Arteries:  Normal coronary origin.  Right dominance. Coronary Calcium Score: Left main: 25 Left anterior descending artery: 191 Left circumflex artery: 161 Right coronary artery: 101 Total: 478 Percentile: 63rd for age, sex, and race matched control. RCA is a large dominant artery that gives rise to PDA and PLA. There is a mild non-obstructive (25-49%) and a minimal non obstructive (1-24%) calcified plaque in the proximal RCA. There are minimal non obstructive (1-24%) calcified plaques in the mid RCA. There is a mild non-obstructive (25-49%) small vessel calcified plaque in the distal tip of the RCA. Left main is a large artery that gives rise to LAD and LCX arteries. There is a < 10% calcified plaque in the proximal vessel. LAD is a large vessel that gives rise to two diagonal vessels. There is a minimal non obstructive (1-24%) calcified plaque in the proximal LAD. There is a mild non-obstructive (25-49%) calcified plaque in the mid LAD, followed by a minimal non obstructive (1-24%) calcified plaque. There are minimal non obstructive (1-24%)  calcified plaques in the distal LAD LCX is a non-dominant artery that gives rise to one and OM1 vessel and an OM2 branch. There is a mild non-obstructive (25-49%) calcified plaque in the proximal LCX. There is a mild non-obstructive (25-49%) calcified plaque in the OM2 vessel. Other findings: Normal variant pulmonary vein drainage into the left atrium: Left common pulmonary vein. Normal left atrial appendage without a thrombus. There is a 79 X 62 X 79 mm low density mass adjacent to the inferior portion of the right ventricle. No evidence intracardiac communication. Hounsfield Units of 45 are less consistent with a cystic origin. Extra-cardiac findings: See attached radiology report for non-cardiac structures. IMPRESSION: 1. Coronary calcium score of 478. This was 63rd percentile for age, sex, and race matched control. 2. Normal coronary origin with right dominance. 3. Aortic atherosclerosis noted. 4. Aortic valve calcium score of 352 (non-severe). 5. CAD-RADS 2. Mild non-obstructive CAD (25-49%). Consider non-atherosclerotic causes of chest pain. Consider preventive therapy and risk factor modification. 6. There is a 79 X 62 X 79 mm low density mass adjacent to the inferior portion of the right ventricle. No evidence intracardiac communication. Hounsfield Units of 45 are less consistent with a cystic origin. See radiology report for more detail. Discussed with primary cardiologist. RECOMMENDATIONS: Coronary artery calcium (CAC) score is a strong predictor of incident coronary heart disease (CHD) and provides predictive information beyond traditional risk factors. CAC scoring is reasonable to use in the decision to withhold, postpone, or initiate statin therapy in intermediate-risk or selected borderline-risk asymptomatic adults (age 107-75 years and LDL-C >=70 to <190 mg/dL) who do not have diabetes or established atherosclerotic cardiovascular disease (ASCVD).* In intermediate-risk (10-year ASCVD risk >=7.5% to  <20%) adults or selected borderline-risk (10-year ASCVD risk >=5% to <7.5%) adults in whom a CAC score is measured for the purpose of making a treatment decision the following recommendations have been made: If CAC = 0, it is reasonable to withhold statin therapy and reassess in 5 to 10 years, as long as higher risk conditions are absent (diabetes mellitus, family history of premature CHD in first degree relatives (males <55 years; females <65 years), cigarette smoking, LDL >=190 mg/dL or other independent risk factors). If CAC is 1 to 99, it is reasonable to initiate statin therapy for patients ?75 years of age. If CAC is >=100 or >=75th percentile, it is reasonable to initiate  statin therapy at any age. Cardiology referral should be considered for patients with CAC scores =400 or >=75th percentile. *2018 AHA/ACC/AACVPR/AAPA/ABC/ACPM/ADA/AGS/APhA/ASPC/NLA/PCNA Guideline on the Management of Blood Cholesterol: A Report of the American College of Cardiology/American Heart Association Task Force on Clinical Practice Guidelines. J Am Coll Cardiol. 2019;73(24):3168-3209. Rudean Haskell, MD Electronically Signed   By: Rudean Haskell MD   On: 02/25/2021 13:21   Result Date: 02/25/2021 EXAM: OVER-READ INTERPRETATION  CT CHEST The following report is an over-read performed by radiologist Dr. Misty Stanley of [facility], [place] on 02/25/2021. This over-read does not include interpretation of cardiac or coronary anatomy or pathology. The Coronary CTA interpretation by the cardiologist is attached. COMPARISON:  None. FINDINGS: Necrotic mass lesion identified between the right heart and segment II of the left liver, suspicious for liver origin. Please see dedicated CTA Chest exam performed at the same time. IMPRESSION: Necrotic mass lesion identified between the right heart and segment II of the left liver, suspicious for liver origin. For full chest findings please see report for pulmonary artery CTA Chest  exam performed at the same time. Electronically Signed: By: Misty Stanley M.D. On: 02/25/2021 11:02    Labs:  CBC: Recent Labs    03/04/21 1810 03/06/21 0423 03/07/21 0912 03/09/21 0931  WBC 11.8* 7.7 3.8* 5.1  HGB 11.0* 10.3* 9.8* 9.5*  HCT 34.6* 31.4* 30.5* 30.2*  PLT 513* 443* 379 383    COAGS: Recent Labs    08/01/20 1036 03/05/21 1006  INR 1.0 1.3*  APTT 29  --     BMP: Recent Labs    01/06/21 1515 01/16/21 1626 02/23/21 0906 03/04/21 1810 03/06/21 0423 03/07/21 0912  NA 134*   < > 136 131* 133* 138  K 4.9   < > 5.2 4.1 3.7 3.6  CL 100   < > 100 98 100 105  CO2 21*   < > 22 23 25 24   GLUCOSE 123*   < > 116* 110* 109* 148*  BUN 26*   < > 14 17 19 12   CALCIUM 10.8*   < > 10.7* 10.3 10.1 10.0  CREATININE 1.23   < > 1.09 1.16 1.23 1.12  GFRNONAA >60  --   --  >60 >60 >60   < > = values in this interval not displayed.    LIVER FUNCTION TESTS: Recent Labs    08/01/20 1036 01/06/21 1515 03/04/21 1810 03/07/21 0912  BILITOT 0.5 0.9 0.7 0.5  AST 23 25 28  32  ALT 18 20 20 26   ALKPHOS 63 122 107 87  PROT 6.7 8.1 7.6 6.4*  ALBUMIN 3.9 3.3* 2.5* 2.2*    TUMOR MARKERS: No results for input(s): AFPTM, CEA, CA199, CHROMGRNA in the last 8760 hours.  Assessment:  75 y.o. male with hepatic abscess s/p drain placement with Dr. Earleen Newport on 03/05/2021.  Patient  presents today for follow-up CT scan. The CT scan reviewed by Dr. Vernard Gambles who recommends removal of the drain.   Drain removed intact, patient tolerated the procedure well.   Dressing was placed.   Instructed patient to keep the dressing dry and clean for 24 hours, dressing can be changed to regular bandage tomorrow.  Keep bandage on to take a shower, remove the bandage and pat dry the area and place a new bandage until fully heals.  No submerging (swimming or bathing) for 7 days.  Check site daily for signs of infection such severe redness of the skin, thick odorous drainage, worsening tenderness  around the site.  Patient verbalized understanding.    Electronically Signed: Tera Mater PA-C 03/19/2021, 2:29 PM   Please refer to Dr. Adron Bene  attestation of this note for management and plan.

## 2021-03-20 ENCOUNTER — Ambulatory Visit (INDEPENDENT_AMBULATORY_CARE_PROVIDER_SITE_OTHER): Payer: Medicare Other | Admitting: Family Medicine

## 2021-03-20 ENCOUNTER — Encounter: Payer: Self-pay | Admitting: Family Medicine

## 2021-03-20 ENCOUNTER — Other Ambulatory Visit: Payer: Self-pay

## 2021-03-20 VITALS — BP 120/66 | HR 60 | Temp 97.5°F | Ht 72.0 in | Wt 214.4 lb

## 2021-03-20 DIAGNOSIS — K750 Abscess of liver: Secondary | ICD-10-CM | POA: Diagnosis not present

## 2021-03-20 DIAGNOSIS — E039 Hypothyroidism, unspecified: Secondary | ICD-10-CM | POA: Diagnosis not present

## 2021-03-20 DIAGNOSIS — R739 Hyperglycemia, unspecified: Secondary | ICD-10-CM

## 2021-03-20 LAB — TSH: TSH: 0.05 u[IU]/mL — ABNORMAL LOW (ref 0.35–5.50)

## 2021-03-20 NOTE — Progress Notes (Signed)
[name] [address] [place] [address] Dept: [phone] Dept Fax: [phone]  Office Visit  Subjective:    Patient ID: [name], male    DOB: [birth year], 75 y.o..   MRN: [id number]  Chief Complaint  Patient presents with   Hospitalization [facility] f/u.   No concerns.  "States he is feeling good"  not taking meds given for depression/anxiety. Needs refill on Allopurinol     History of Present Illness:  Patient is in today for hospital follow-up. [name] was admitted to [facility] from 7/7-7/06/2021. He had been strugglign with profound fatigue since May, after having a UTI. His early evaluation was negative, but he developed progressive dyspnea on exertion. He was referred to cardiology. He had a chest CT to rule-out a pulmonary embolus. It showed an 8 cm mass arising from the left liver and extending up into the diaphragm and creating a mass-effect on the right heart. The CT was suggestive of this being a primary or metastatic cancer. An MRI, however, suggested this was more likely an abscess. [name] subsequently developed signs of sepsis, so was admitted. During his hospitalization, [name] had an ultrasound-guided drainage of the abscess and placement of a pigtail drain. Tumor markers were obtained that were all negative. The cultures grew Streptococcus intermedius and a few Fusobacterium nucleatum. He was treated initially with IV antibiotics and then discharged on cefdinir and metronidazole.  [name] is feeling much improved at this point. He notes his breathing issues are resolved. he is eating better. He denies any night sweats. He had a follow-up CT scan recently.  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   Thrombocytopenia ([place]) 05/03/2019   Gout 05/03/2019   Screen for colon cancer 05/03/2019   Personal history of prostate cancer 05/03/2019   Obesity (BMI 30.0-34.9) 04/26/2019   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    Past Surgical History:  Procedure Laterality Date   EYE SURGERY Bilateral 2014   HERNIA REPAIR Right 1984   inguinal   IR RADIOLOGIST EVAL & MGMT  03/19/2021   KNEE ARTHROSCOPY Left 1966   PENILE PROSTHESIS IMPLANT  2014   TOTAL KNEE ARTHROPLASTY Right 08/11/2020   Procedure: TOTAL KNEE ARTHROPLASTY;  Surgeon: Gaynelle Arabian, MD;  Location: [facility];  Service: Orthopedics;  Laterality: Right;  16mn   TRANSURETHRAL RESECTION OF BLADDER TUMOR  2018    Family History  Problem Relation Age of Onset   Cancer Neg Hx     Outpatient Medications Prior to Visit  Medication Sig Dispense Refill   allopurinol (ZYLOPRIM) 100 MG tablet TAKE 1 TABLET(100 MG) BY MOUTH TWICE DAILY 180 tablet 2   carvedilol (COREG) 25 MG tablet TAKE 1 TABLET(25 MG) BY MOUTH TWICE DAILY 180 tablet 2   cefdinir (OMNICEF) 300 MG capsule Take 1 capsule (300 mg total) by mouth every 12 (twelve) hours for 23 days. 46 capsule 0   Coenzyme Q10 (COQ10 PO) Take 1 Dose by mouth daily. Liquid form     folic acid (FOLVITE) 1 MG tablet Take  1 mg by mouth daily.     levothyroxine (SYNTHROID) 88 MCG tablet Take 1 tablet (88 mcg total) by mouth daily before breakfast. 90 tablet 3   metroNIDAZOLE (FLAGYL) 500 MG tablet Take 1 tablet (500 mg total) by mouth 2 (two) times daily for 23 days. 46 tablet 0   rosuvastatin (CRESTOR) 10 MG tablet TAKE 1 TABLET(10 MG) BY MOUTH DAILY 90 tablet 3   ALPRAZolam (XANAX) 1 MG tablet Take 1/2 to 1 tablet up tow twice a day  as needed for anxiety. (Patient not taking: Reported on 03/20/2021) 20 tablet 1   PARoxetine (PAXIL) 10 MG tablet Take 1 tablet (10 mg total) by mouth daily. (Patient not taking: Reported on 03/20/2021) 30 tablet 1   Sodium Chloride Flush (SALINE FLUSH) 0.9 % SOLN Place 10 mLs into feeding tube daily. (Patient not taking: Reported on 03/20/2021) 300 mL 0   No facility-administered medications prior to visit.    No Known Allergies    Objective:   Today's Vitals   03/20/21 1134  BP: 120/66  Pulse: 60  Temp: (!) 97.5 F (36.4 C)  TempSrc: Temporal  SpO2: 97%  Weight: 214 lb 6.4 oz (97.3 kg)  Height: 6' (1.829 m)   Body mass index is 29.08 kg/m.   General: Well developed, well nourished. No acute distress. Abdomen: Bandage in place. No sign of infection. Psych: Alert and oriented x3. Normal mood and affect.  Health Maintenance Due  Topic Date Due   Hepatitis C Screening  Never done   TETANUS/TDAP  Never done   Zoster Vaccines- Shingrix (1 of 2) Never done   PNA vac Low Risk Adult (1 of 2 - PCV13) Never done   COVID-19 Vaccine (4 - Booster for Pfizer series) 12/04/2020   Lab Results Lab Results  Component Value Date   WBC 5.1 03/09/2021   HGB 9.5 (L) 03/09/2021   HCT 30.2 (L) 03/09/2021   MCV 83.9 03/09/2021   PLT 383 03/09/2021   CMP Latest Ref Rng & Units 03/07/2021 03/06/2021 03/04/2021  Glucose 70 - 99 mg/dL 148(H) 109(H) 110(H)  BUN 8 - 23 mg/dL '12 19 17  '$ Creatinine 0.61 - 1.24 mg/dL 1.12 1.23 1.16  Sodium 135 - 145 mmol/L 138 133(L) 131(L)  Potassium 3.5 - 5.1 mmol/L 3.6 3.7 4.1  Chloride 98 - 111 mmol/L 105 100 98  CO2 22 - 32 mmol/L '24 25 23  '$ Calcium 8.9 - 10.3 mg/dL 10.0 10.1 10.3  Total Protein 6.5 - 8.1 g/dL 6.4(L) - 7.6  Total Bilirubin 0.3 - 1.2 mg/dL 0.5 - 0.7  Alkaline Phos 38 - 126 U/L 87 - 107  AST 15 - 41 U/L 32 - 28  ALT 0 - 44 U/L 26 - 20     Imaging: CT Abdomen and Pelvis (03/19/2021) IMPRESSION: 1. Interval resolution of a hepatic abscess post  percutaneous drainage. 2. Bilateral nephrolithiasis without hydronephrosis. 3.  Aortic Atherosclerosis (ICD10-170.0).  Assessment & Plan:   1. Abscess of liver CT shows resolution of the abscess. [name] will complete his course of antibiotics. He should advance diet and activity as tolerated.  2. Acquired hypothyroidism Reviewed discharge summary. Recommended repeat TSH at follow-up.  - TSH  3. Hyperglycemia Mr. Holms had some elevated blood sugars during his hospitalization. This is likely have been due to stress, but will plan for an HbA1c and glucose at his next visit.  Haydee Salter, MD

## 2021-04-22 ENCOUNTER — Ambulatory Visit: Payer: Medicare Other | Admitting: Family Medicine

## 2021-07-11 ENCOUNTER — Other Ambulatory Visit: Payer: Self-pay | Admitting: Family Medicine

## 2021-07-13 ENCOUNTER — Other Ambulatory Visit: Payer: Self-pay | Admitting: Family Medicine

## 2021-07-14 ENCOUNTER — Other Ambulatory Visit: Payer: Self-pay | Admitting: Family

## 2021-07-15 ENCOUNTER — Encounter: Payer: Self-pay | Admitting: Family Medicine

## 2021-07-15 ENCOUNTER — Other Ambulatory Visit: Payer: Self-pay

## 2021-07-15 ENCOUNTER — Ambulatory Visit (INDEPENDENT_AMBULATORY_CARE_PROVIDER_SITE_OTHER): Payer: Medicare Other | Admitting: Family Medicine

## 2021-07-15 VITALS — BP 150/84 | HR 54 | Temp 97.9°F | Ht 72.0 in | Wt 236.2 lb

## 2021-07-15 DIAGNOSIS — E039 Hypothyroidism, unspecified: Secondary | ICD-10-CM

## 2021-07-15 DIAGNOSIS — Z23 Encounter for immunization: Secondary | ICD-10-CM | POA: Diagnosis not present

## 2021-07-15 DIAGNOSIS — Z8546 Personal history of malignant neoplasm of prostate: Secondary | ICD-10-CM

## 2021-07-15 DIAGNOSIS — K750 Abscess of liver: Secondary | ICD-10-CM | POA: Diagnosis not present

## 2021-07-15 DIAGNOSIS — I10 Essential (primary) hypertension: Secondary | ICD-10-CM

## 2021-07-15 DIAGNOSIS — M109 Gout, unspecified: Secondary | ICD-10-CM | POA: Diagnosis not present

## 2021-07-15 DIAGNOSIS — E785 Hyperlipidemia, unspecified: Secondary | ICD-10-CM

## 2021-07-15 DIAGNOSIS — R7303 Prediabetes: Secondary | ICD-10-CM

## 2021-07-15 DIAGNOSIS — D649 Anemia, unspecified: Secondary | ICD-10-CM

## 2021-07-15 MED ORDER — ROSUVASTATIN CALCIUM 10 MG PO TABS: ORAL_TABLET | ORAL | 3 refills | Status: DC

## 2021-07-15 MED ORDER — CARVEDILOL 25 MG PO TABS
ORAL_TABLET | ORAL | 3 refills | Status: DC
Start: 2021-07-15 — End: 2022-05-24

## 2021-07-15 MED ORDER — ALLOPURINOL 100 MG PO TABS: ORAL_TABLET | ORAL | 2 refills | Status: DC

## 2021-07-15 NOTE — Progress Notes (Signed)
[facility] [address] [place] [address] Dept: [phone] Dept Fax: [phone]  Chronic Care Office Visit  Subjective:    Patient ID: [name], male    DOB: Aug 14, 1946, 75 y.o..   MRN: [id number]  Chief Complaint  Patient presents with   Follow-up    F/u meds. Wants blood work, not fasting today.       History of Present Illness:  Patient is in today for reassessment of chronic medical issues.  [name] notes that he is feeling much better. This summer, he had been found to have an intraabdominal abscess, which was drained under ultrasound-guidance. He has been able to regain the weight he had lost during his illness. he denies any abdominal pain at this point. He had some anemia around the time of his infection.  [name] has a history of hypertension. He is managed on carvedilol, but notes he has been out of his medication for several days.  [name] has a history of hypothyroidism. He had a low TSH this summer. We did not change his levothyroxine at the time due to his illness.  [name] has a history of gout. He is managed on allopurinol to reduce risk of attacks.  [name] has a prior history of prostate cancer, s/p prostatectomy and radiation.  [name] has a history of hyperlipidemia. He is managed on rosuvastatin. 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   Thrombocytopenia ([place]) 05/03/2019   Gout 05/03/2019   Personal history of prostate cancer 05/03/2019   Obesity (BMI 30.0-34.9)  04/26/2019   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   Past Surgical History:  Procedure Laterality Date   EYE SURGERY Bilateral 2014   HERNIA REPAIR Right 1984   inguinal   IR RADIOLOGIST EVAL & MGMT  03/19/2021   KNEE ARTHROSCOPY Left 1966   PENILE PROSTHESIS IMPLANT  2014   TOTAL KNEE ARTHROPLASTY Right 08/11/2020   Procedure: TOTAL KNEE ARTHROPLASTY;  Surgeon: Gaynelle Arabian, MD;  Location: [facility];  Service: Orthopedics;  Laterality: Right;  68min   TRANSURETHRAL RESECTION OF BLADDER TUMOR  2018   Family History  Problem Relation Age of Onset   Cancer Neg Hx    Outpatient Medications Prior to Visit  Medication Sig Dispense Refill   Coenzyme Q10 (COQ10 PO) Take 1 Dose by mouth daily. Liquid form     folic acid (FOLVITE) 1 MG tablet Take 1 mg by mouth daily.     levothyroxine (SYNTHROID) 88 MCG tablet Take 1 tablet (88 mcg total) by mouth daily before breakfast. 90 tablet 3   allopurinol (ZYLOPRIM) 100 MG tablet TAKE 1 TABLET(100 MG) BY MOUTH TWICE DAILY 180 tablet 2   carvedilol (COREG) 25 MG tablet TAKE 1 TABLET(25 MG) BY MOUTH TWICE DAILY 180 tablet 2   rosuvastatin (CRESTOR) 10 MG tablet TAKE 1 TABLET(10 MG) BY MOUTH DAILY 30 tablet 1   No facility-administered medications prior to visit.   No Known Allergies    Objective:   Today's Vitals   07/15/21 1326  BP: (!) 150/84  Pulse: (!) 54  Temp: 97.9 F (  36.6 C)  TempSrc: Temporal  SpO2: 99%  Weight: 236 lb 3.2 oz (107.1 kg)  Height: 6' (1.829 m)   Body mass index is 32.03 kg/m.   General: Well developed, well nourished. No acute distress. Abdomen: Soft, non-tender. No rebound or guarding. Psych: Alert and oriented. Normal mood and affect.  Health Maintenance Due  Topic Date Due   Pneumonia Vaccine 69+ Years old (1 - PCV) Never done   Hepatitis C Screening  Never done   TETANUS/TDAP  Never done   Zoster Vaccines- Shingrix (1 of 2) Never done   COVID-19 Vaccine (4 - Booster for Pfizer  series) 10/31/2020     Assessment & Plan:   1. Abscess of liver The abscess appears to have resolved with drainage and antibiotics. I will check a CMP to reassess his liver enzymes and a CT to follow-up to assure resolution.  - Comprehensive metabolic panel; Future - CT Abdomen Pelvis W Contrast; Future  2. Gout, unspecified cause, unspecified chronicity, unspecified site We will continue allopurinol.  - allopurinol (ZYLOPRIM) 100 MG tablet; TAKE 1 TABLET(100 MG) BY MOUTH TWICE DAILY  Dispense: 180 tablet; Refill: 2  3. Essential hypertension Blood pressure is a bit high today, but he has been off of his medication. I will renew his carvedilol.  - carvedilol (COREG) 25 MG tablet; TAKE 1 TABLET(25 MG) BY MOUTH TWICE DAILY  Dispense: 180 tablet; Refill: 3  4. Acquired hypothyroidism Due for repeat TSH. Last TSH was low.  - TSH; Future  5. Hyperlipidemia, unspecified hyperlipidemia type Due for follow-up lipids to assess his current statin therapy.  - rosuvastatin (CRESTOR) 10 MG tablet; TAKE 1 TABLET(10 MG) BY MOUTH DAILY  Dispense: 90 tablet; Refill: 3 - Lipid panel; Future  6. Anemia, unspecified type Due for follow-up CBC to assure resolution of his anemia.  - CBC; Future  7. Personal history of prostate cancer We will monitor his PSA.  - PSA; Future  8. Prediabetes Prior elevated A1c. We are due to reassess.  - Hemoglobin A1c; Future  Haydee Salter, MD

## 2021-07-15 NOTE — Addendum Note (Signed)
Addended by: [name] on: 07/15/2021 02:26 PM   Modules accepted: Orders

## 2021-07-17 ENCOUNTER — Other Ambulatory Visit (INDEPENDENT_AMBULATORY_CARE_PROVIDER_SITE_OTHER): Payer: Medicare Other

## 2021-07-17 ENCOUNTER — Other Ambulatory Visit: Payer: Self-pay

## 2021-07-17 DIAGNOSIS — R7303 Prediabetes: Secondary | ICD-10-CM

## 2021-07-17 DIAGNOSIS — K750 Abscess of liver: Secondary | ICD-10-CM

## 2021-07-17 DIAGNOSIS — E039 Hypothyroidism, unspecified: Secondary | ICD-10-CM

## 2021-07-17 DIAGNOSIS — D649 Anemia, unspecified: Secondary | ICD-10-CM

## 2021-07-17 DIAGNOSIS — Z8546 Personal history of malignant neoplasm of prostate: Secondary | ICD-10-CM

## 2021-07-17 DIAGNOSIS — E785 Hyperlipidemia, unspecified: Secondary | ICD-10-CM

## 2021-07-17 LAB — COMPREHENSIVE METABOLIC PANEL
ALT: 16 U/L (ref 0–53)
AST: 19 U/L (ref 0–37)
Albumin: 3.9 g/dL (ref 3.5–5.2)
Alkaline Phosphatase: 86 U/L (ref 39–117)
BUN: 22 mg/dL (ref 6–23)
CO2: 28 mEq/L (ref 19–32)
Calcium: 10.1 mg/dL (ref 8.4–10.5)
Chloride: 105 mEq/L (ref 96–112)
Creatinine, Ser: 1.07 mg/dL (ref 0.40–1.50)
GFR: 67.91 mL/min (ref >60.00)
Glucose, Bld: 105 mg/dL — ABNORMAL HIGH (ref 70–99)
Potassium: 4.9 mEq/L (ref 3.5–5.1)
Sodium: 139 mEq/L (ref 135–145)
Total Bilirubin: 0.4 mg/dL (ref 0.2–1.2)
Total Protein: 6.6 g/dL (ref 6.0–8.3)

## 2021-07-17 LAB — LIPID PANEL
Cholesterol: 136 mg/dL (ref 0–200)
HDL: 43.3 mg/dL (ref >39.00)
LDL Cholesterol: 56 mg/dL (ref 0–99)
NonHDL: 93.05
Total CHOL/HDL Ratio: 3
Triglycerides: 187 mg/dL — ABNORMAL HIGH (ref 0.0–149.0)
VLDL: 37.4 mg/dL (ref 0.0–40.0)

## 2021-07-17 LAB — CBC
HCT: 43.5 % (ref 39.0–52.0)
Hemoglobin: 14.5 g/dL (ref 13.0–17.0)
MCHC: 33.3 g/dL (ref 30.0–36.0)
MCV: 87.7 fl (ref 78.0–100.0)
Platelets: 136 10*3/uL — ABNORMAL LOW (ref 150.0–400.0)
RBC: 4.96 Mil/uL (ref 4.22–5.81)
RDW: 14.9 % (ref 11.5–15.5)
WBC: 4.5 10*3/uL (ref 4.0–10.5)

## 2021-07-17 LAB — TSH: TSH: 4.28 u[IU]/mL (ref 0.35–5.50)

## 2021-07-17 LAB — PSA: PSA: 0 ng/mL — ABNORMAL LOW (ref 0.10–4.00)

## 2021-07-17 LAB — HEMOGLOBIN A1C: Hgb A1c MFr Bld: 6.1 % (ref 4.6–6.5)

## 2021-07-17 NOTE — Progress Notes (Signed)
Per the orders of Dr.[name] pt is here for labs pt tolerated draw well.

## 2021-08-14 ENCOUNTER — Other Ambulatory Visit: Payer: Medicare Other

## 2021-09-03 ENCOUNTER — Inpatient Hospital Stay: Admission: RE | Admit: 2021-09-03 | Payer: Medicare Other | Source: Ambulatory Visit

## 2021-09-16 ENCOUNTER — Other Ambulatory Visit: Payer: Self-pay | Admitting: Family Medicine

## 2021-09-16 DIAGNOSIS — M109 Gout, unspecified: Secondary | ICD-10-CM

## 2021-09-21 ENCOUNTER — Ambulatory Visit
Admission: RE | Admit: 2021-09-21 | Discharge: 2021-09-21 | Disposition: A | Discharge status: Home / Self Care | Payer: Medicare Other | Source: Ambulatory Visit | Attending: Family Medicine | Admitting: Family Medicine

## 2021-09-21 ENCOUNTER — Other Ambulatory Visit: Payer: Self-pay

## 2021-09-21 ENCOUNTER — Other Ambulatory Visit: Payer: Medicare Other

## 2021-09-21 DIAGNOSIS — K750 Abscess of liver: Secondary | ICD-10-CM

## 2021-09-21 IMAGING — CT CT ABD-PELV W/ CM
1 of 3 series · 12 of 32 positions shown, 18 images · IV contrast (APPLIED)
Comparison: March 19, 2021.

CLINICAL DATA: Status post drainage of liver abscess.

EXAM:
CT ABDOMEN AND PELVIS WITH CONTRAST
TECHNIQUE: Multidetector CT imaging of the abdomen and pelvis was performed
using the standard protocol following bolus administration of
intravenous contrast.

[Series 2: abd/pelvis w/cm · axial · 0.93mm/px · z∈[-503,-78]mm · 12 of 101 slices shown, 18 images]
[im 8/101  soft-tissue]
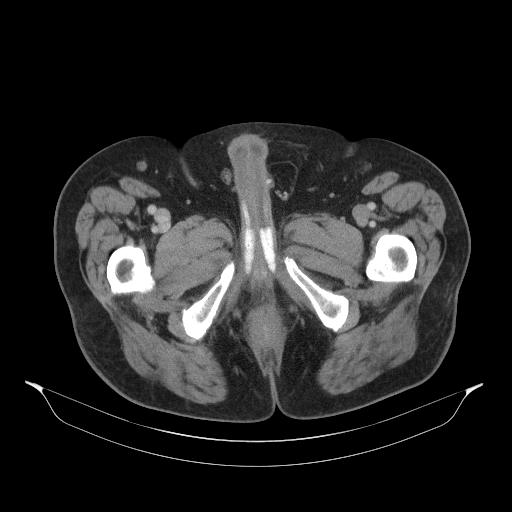
[im 8/101  bone]
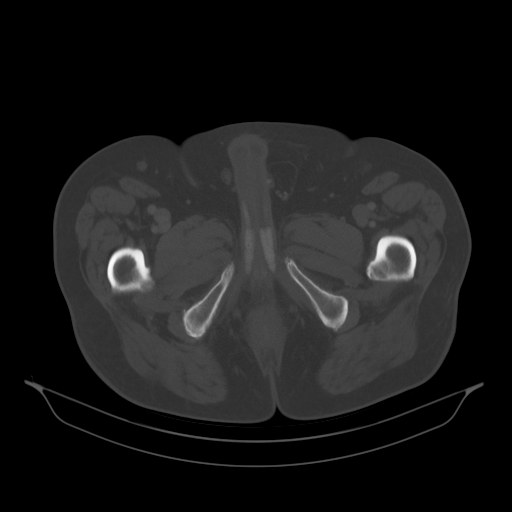
[im 16/101  soft-tissue]
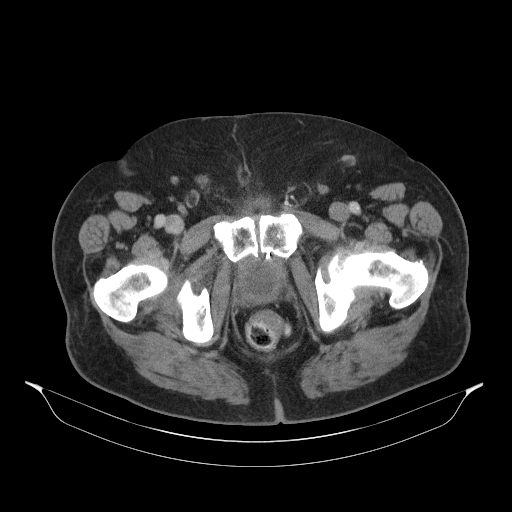
[im 24/101  soft-tissue]
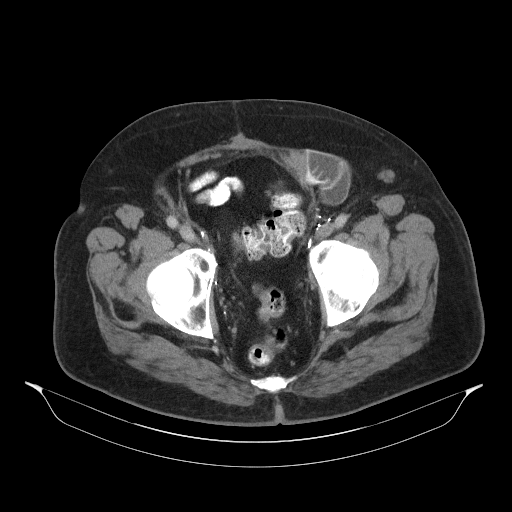
[im 31/101  soft-tissue]
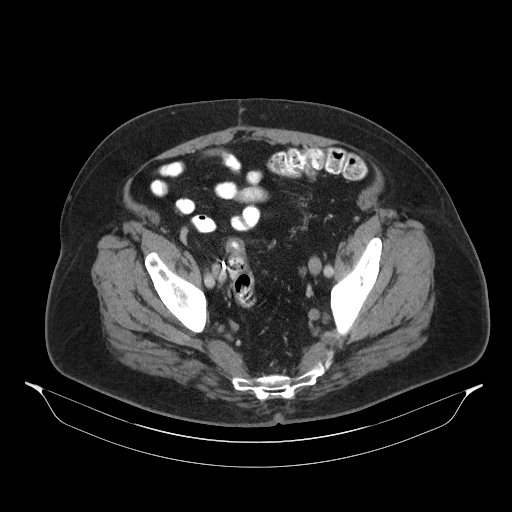
[im 39/101  soft-tissue]
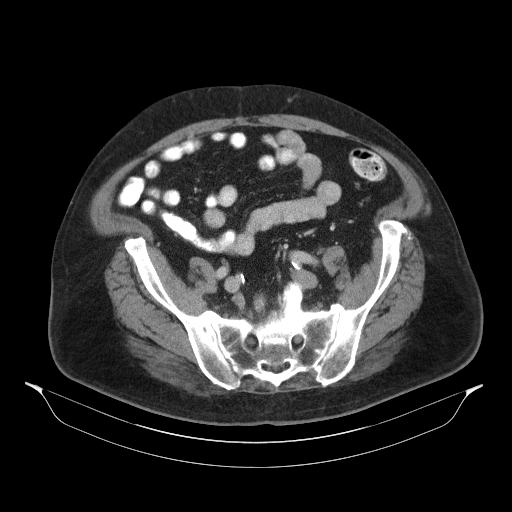
[im 47/101  soft-tissue]
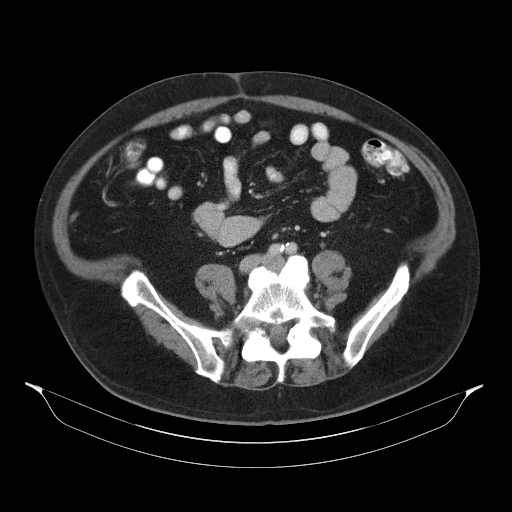
[im 54/101  soft-tissue]
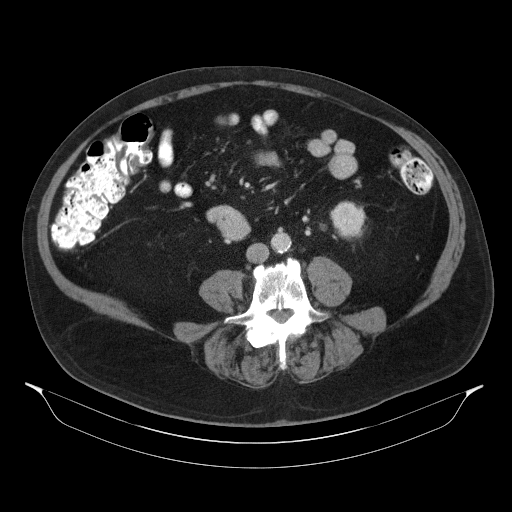
[im 62/101  soft-tissue]
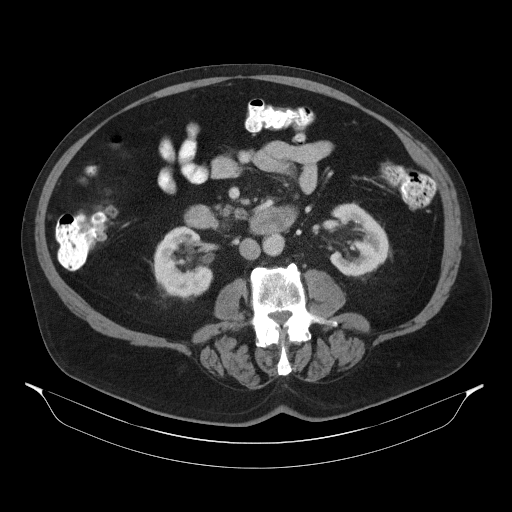
[im 70/101  soft-tissue]
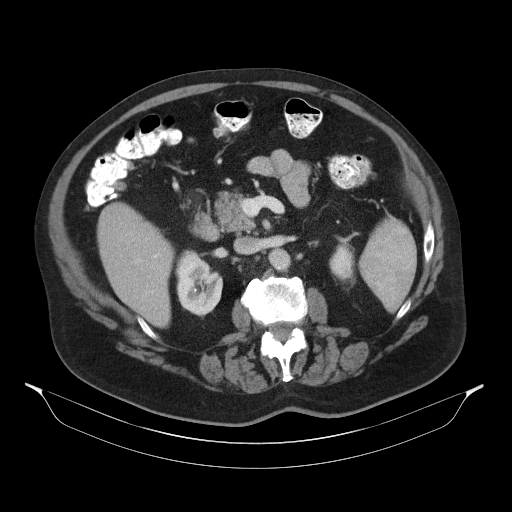
[im 70/101  lung]
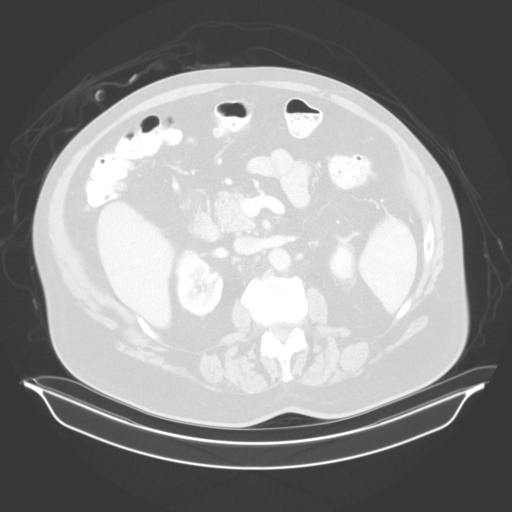
[im 70/101  bone]
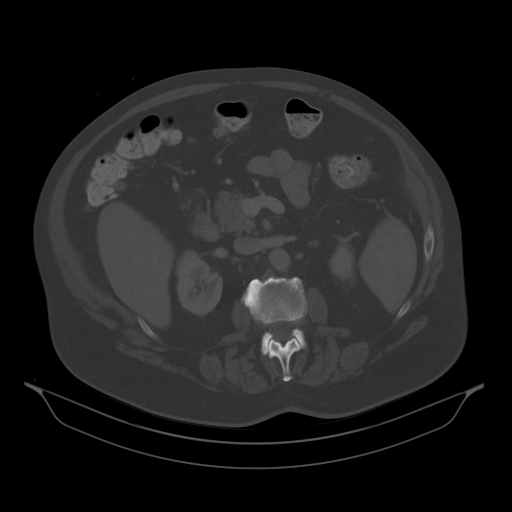
[im 77/101  soft-tissue]
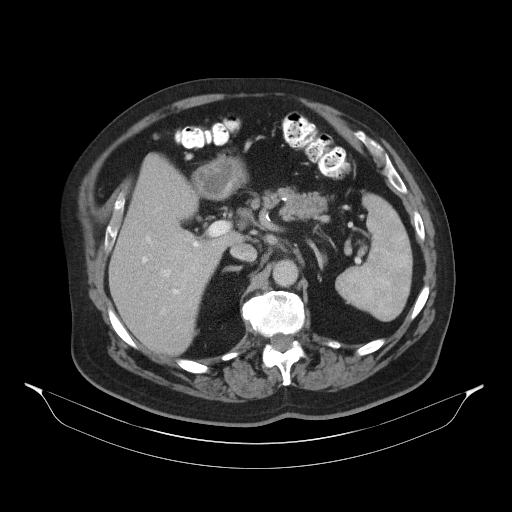
[im 77/101  lung]
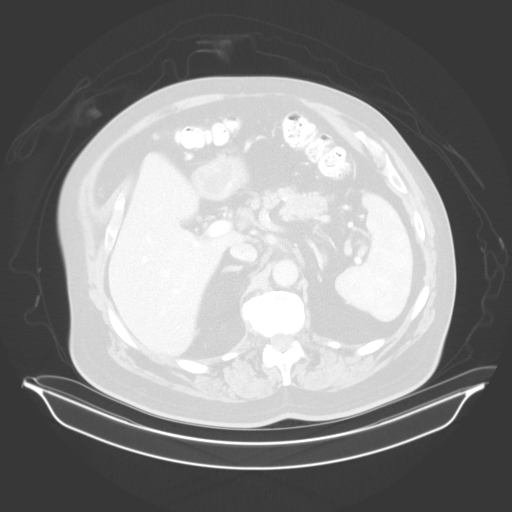
[im 85/101  soft-tissue]
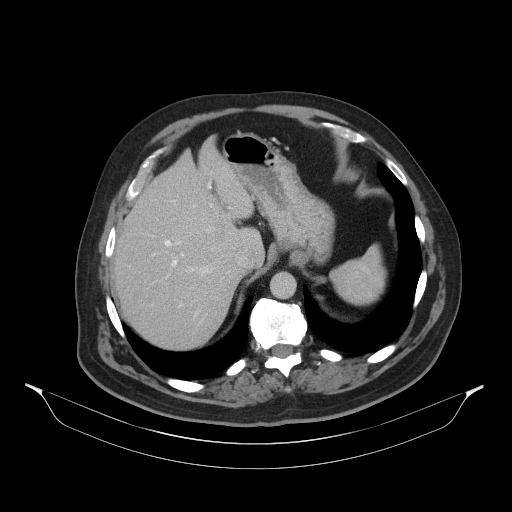
[im 85/101  lung]
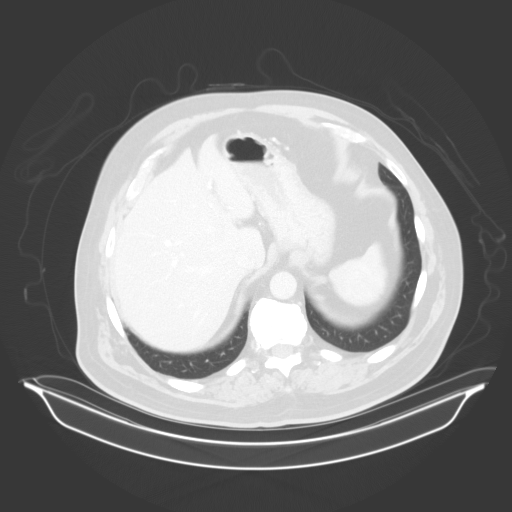
[im 93/101  soft-tissue]
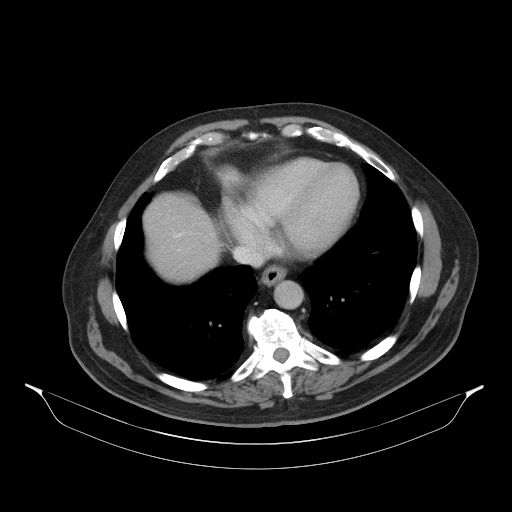
[im 93/101  lung]
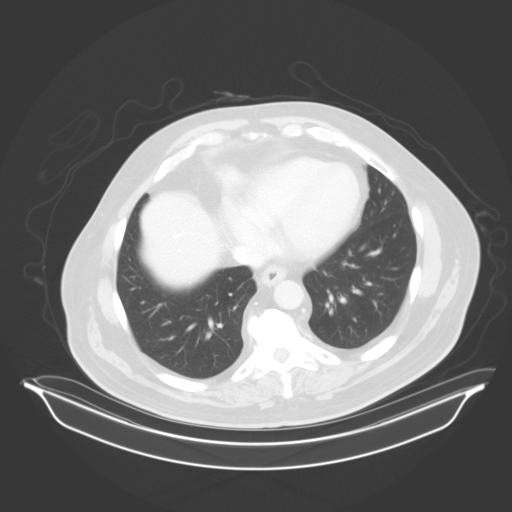

[12 of 32 positions shown; findings below may reference images not displayed]

RADIATION DOSE REDUCTION: This exam was performed according to the
departmental dose-optimization program which includes automated
exposure control, adjustment of the mA and/or kV according to
patient size and/or use of iterative reconstruction technique.

CONTRAST:  100mL J9KCE7-M00 IOPAMIDOL (J9KCE7-M00) INJECTION 61%
FINDINGS: Lower chest: No acute abnormality.

Hepatobiliary: No gallstones or biliary dilatation is noted. Status
post removal of percutaneous drain. No definite hepatic abnormality
is seen at this time.

Pancreas: Unremarkable. No pancreatic ductal dilatation or
surrounding inflammatory changes.

Spleen: Normal in size without focal abnormality.

Adrenals/Urinary Tract: Adrenal glands appear normal. Bilateral
nonobstructive nephrolithiasis is noted. No hydronephrosis or renal
obstruction is noted. Urinary bladder is unremarkable.

Stomach/Bowel: Stomach is within normal limits. Appendix appears
normal. No evidence of bowel wall thickening, distention, or
inflammatory changes.

Vascular/Lymphatic: Aortic atherosclerosis. No enlarged abdominal or
pelvic lymph nodes.

Reproductive: Status post prostatectomy.  Penile implant is noted.

Other: No abdominal wall hernia or abnormality. No abdominopelvic
ascites.

Musculoskeletal: No acute osseous abnormality is noted.
IMPRESSION: Status post removal of percutaneous drainage catheter. No definite
residual hepatic abscess is noted.

Bilateral nonobstructive nephrolithiasis.

Aortic Atherosclerosis ([id number]).

## 2021-09-21 MED ORDER — IOPAMIDOL (ISOVUE-300) INJECTION 61%
100.0000 mL | Freq: Once | INTRAVENOUS | Status: AC | PRN
  Administered 2021-09-21: 100 mL via INTRAVENOUS

## 2021-10-12 ENCOUNTER — Other Ambulatory Visit: Payer: Self-pay

## 2021-10-13 ENCOUNTER — Other Ambulatory Visit: Payer: Self-pay

## 2021-10-14 ENCOUNTER — Ambulatory Visit (INDEPENDENT_AMBULATORY_CARE_PROVIDER_SITE_OTHER): Payer: Medicare Other | Admitting: Family Medicine

## 2021-10-14 ENCOUNTER — Encounter: Payer: Self-pay | Admitting: Family Medicine

## 2021-10-14 VITALS — BP 140/78 | HR 55 | Temp 97.6°F | Ht 72.0 in | Wt 242.2 lb

## 2021-10-14 DIAGNOSIS — E039 Hypothyroidism, unspecified: Secondary | ICD-10-CM

## 2021-10-14 DIAGNOSIS — R0609 Other forms of dyspnea: Secondary | ICD-10-CM

## 2021-10-14 DIAGNOSIS — E785 Hyperlipidemia, unspecified: Secondary | ICD-10-CM | POA: Diagnosis not present

## 2021-10-14 DIAGNOSIS — I700 Atherosclerosis of aorta: Secondary | ICD-10-CM | POA: Diagnosis not present

## 2021-10-14 DIAGNOSIS — I10 Essential (primary) hypertension: Secondary | ICD-10-CM | POA: Diagnosis not present

## 2021-10-14 DIAGNOSIS — R7303 Prediabetes: Secondary | ICD-10-CM

## 2021-10-14 NOTE — Progress Notes (Signed)
[facility] [address] [place] [address] Dept: [phone] Dept Fax: [phone]  Chronic Care Office Visit  Subjective:    Patient ID: [name], male    DOB: [birth year], 76 y.o..   MRN: [id number]  Chief Complaint  Patient presents with   Follow-up    3 month f/u meds.   C/o having SOB with activity x 4-5 days.     History of Present Illness:  Patient is in today for reassessment of chronic medical issues.  Mr. Dennie has a history of hypertension. He is managed on carvedilol. He does not check his BP at home. He also has prediabetes, but no sign of progression.   Mr. Hiney has a history of hypothyroidism. He is managed on levothyroxine.   Mr. Marchiano has a history of gout. He is managed on allopurinol to reduce risk of attacks. He also takes indomethacin as needed for attacks. he notes he took this for 1-2 days about 2 weeks ago.   Mr. Aboud has a history of hyperlipidemia. He is managed on rosuvastatin.  Mr. Croft notes that he had an episode of dyspnea on exertion last Sat. He and his wife were attending a concert at the [facility]. They had a long walk in from the parking lot in a rainstorn. On getting tot he coliseum, Mr. Andreas Newport felt short of breath. He had to stand for about 2 min. before this resolved. He was then able to walk into the arena to his seats without difficulty. He notes that he has been involve din PT for his prior right TKJ replacement. This involves working out on an [employer] stationary bicycle, and doing weight training. he has had no issue with DOE with this. He denies any wheezing.  Mr. Hanning had an intraabdominal abscess last summer, which was drained under ultrasound-guidance. This had presented with DOE due to the mass effect on his diaphragm. He denies any abdominal pain at this point.  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   Thrombocytopenia ([place]) 05/03/2019   Gout 05/03/2019   Personal history of prostate cancer 05/03/2019   Obesity (BMI 30.0-34.9) 04/26/2019   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   Past Surgical History:  Procedure Laterality Date   EYE SURGERY Bilateral 2014   HERNIA REPAIR Right 1984   inguinal   IR RADIOLOGIST EVAL & MGMT  03/19/2021   KNEE ARTHROSCOPY Left 1966   PENILE PROSTHESIS IMPLANT  2014   TOTAL KNEE ARTHROPLASTY Right 08/11/2020   Procedure: TOTAL KNEE ARTHROPLASTY;  Surgeon: Gaynelle Arabian, MD;  Location: [facility];  Service: Orthopedics;  Laterality: Right;  30min   TRANSURETHRAL RESECTION OF BLADDER TUMOR  2018   Family History  Problem Relation Age of Onset   Cancer Neg Hx    Outpatient Medications Prior to Visit  Medication Sig Dispense Refill   allopurinol (ZYLOPRIM) 100 MG tablet TAKE 1 TABLET(100 MG) BY MOUTH TWICE DAILY 180 tablet 0   carvedilol (COREG) 25 MG tablet TAKE 1 TABLET(25 MG) BY MOUTH TWICE DAILY 180 tablet 3   Coenzyme Q10 (COQ10 PO) Take 1 Dose  by mouth daily. Liquid form     folic acid (FOLVITE) 1 MG tablet Take 1 mg by mouth daily.     indomethacin (INDOCIN) 25 MG capsule Take by mouth.     levothyroxine (SYNTHROID) 88 MCG tablet Take 1 tablet (88 mcg total) by mouth daily before breakfast. 90 tablet 3   rosuvastatin (CRESTOR) 10 MG tablet TAKE 1 TABLET(10 MG) BY MOUTH DAILY 90 tablet 3   No facility-administered medications prior to visit.   No Known Allergies    Objective:   Today's Vitals   10/14/21 0826  BP: 140/78  Pulse: (!) 55  Temp: 97.6 F  (36.4 C)  TempSrc: Temporal  SpO2: 99%  Weight: 242 lb 3.2 oz (109.9 kg)  Height: 6' (1.829 m)   Body mass index is 32.85 kg/m.   General: Well developed, well nourished. No acute distress. Lungs: Clear to auscultation bilaterally. No wheezing, rales or rhonchi. CV: RRR with I-II/VI holosystolic murmur. Pulses 2+ bilaterally. Psych: Alert and oriented. Normal mood and affect.  Health Maintenance Due  Topic Date Due   Hepatitis C Screening  Never done   TETANUS/TDAP  Never done   Zoster Vaccines- Shingrix (1 of 2) Never done   COVID-19 Vaccine (4 - Booster for Pfizer series) 10/31/2020   Lab Results Last CBC Lab Results  Component Value Date   WBC 4.5 07/17/2021   HGB 14.5 07/17/2021   HCT 43.5 07/17/2021   MCV 87.7 07/17/2021   MCH 26.4 03/09/2021   RDW 14.9 07/17/2021   PLT 136.0 (L) 38/05/1750   Last metabolic panel Lab Results  Component Value Date   GLUCOSE 105 (H) 07/17/2021   NA 139 07/17/2021   K 4.9 07/17/2021   CL 105 07/17/2021   CO2 28 07/17/2021   BUN 22 07/17/2021   CREATININE 1.07 07/17/2021   GFRNONAA >60 03/07/2021   CALCIUM 10.1 07/17/2021   PROT 6.6 07/17/2021   ALBUMIN 3.9 07/17/2021   BILITOT 0.4 07/17/2021   ALKPHOS 86 07/17/2021   AST 19 07/17/2021   ALT 16 07/17/2021   ANIONGAP 9 03/07/2021   Last lipids Lab Results  Component Value Date   CHOL 136 07/17/2021   HDL 43.30 07/17/2021   LDLCALC 56 07/17/2021   LDLDIRECT 81 01/11/2020   TRIG 187.0 (H) 07/17/2021   CHOLHDL 3 07/17/2021   Last hemoglobin A1c Lab Results  Component Value Date   HGBA1C 6.1 07/17/2021   Last thyroid functions Lab Results  Component Value Date   TSH 4.28 07/17/2021   Lab Results  Component Value Date   PSA 0.00 (L) 07/17/2021   PSA 0.00 (L) 11/18/2020   PSA 0.00 Repeated and verified X2. (L) 05/03/2019   Imaging: CT Abdomen and Pelvis w contrast (09/21/2021) IMPRESSION: Status post removal of percutaneous drainage catheter. No definite  residual hepatic abscess is noted.   Bilateral nonobstructive nephrolithiasis.   Aortic Atherosclerosis (ICD10-I70.0).    Echocardiogram (02/13/2022) IMPRESSIONS   1. Left ventricular ejection fraction, by estimation, is 60 to 65%. The left ventricle has normal function. The left ventricle has no regional wall motion abnormalities. Left ventricular diastolic parameters are consistent with Grade I diastolic dysfunction (impaired relaxation).   2. Right ventricular systolic function is mildly reduced. The right ventricular size is normal.   3. 3.5 x 6.5 cm echolucent oval structure adjacent to the posterior wall -apperas extracardia - seems to compress the RV some. Consider pericardial cyst, hiatal hernia or other extracardiac masses such as hepatic mass/cyst - cross-sectional  imaging such as CT may be helpful.   4. The mitral valve is abnormal. Trivial mitral valve regurgitation.   5. The aortic valve is calcified. Aortic valve regurgitation is not visualized. Mild aortic valve stenosis. Aortic valve area, by VTI measures 1.71 cm. Aortic valve mean gradient measures 13.0 mmHg. Aortic valve Vmax measures 2.41 m/s. Peak gradient 23.2 mmHg. DI is 0.54.   6. The inferior vena cava is normal in size with greater than 50% respiratory variability, suggesting right atrial pressure of 3 mmHg.   Assessment & Plan:   1. Dyspnea on exertion Mr. Kleven has had a single episode of DOE that occurred under unusual circumstances (walking fast, a long distance, during a rainstorm). This may be a benign occurrence. He does have a mild heart murmur which may reflect the previously identified aortic stenosis. He had an echocardiogram last summer that was being impacted by the larger abscess which was impinging on his heart. Although I do not think he has had this recur based on his recent CT scan, I do think he may need a follow-up echocardiogram. I will refer him back to Dr. Margaretann Loveless for assessment.  - Ambulatory  referral to Cardiology  2. Essential hypertension Blood pressure is a little high today. We discussed Mr. Hensch obtaining a home blood pressure cuff so that he can monitor his blood pressures.  3. Aortic atherosclerosis ([place]) 4. Hyperlipidemia, unspecified hyperlipidemia type Last lipids were well managed on rosuvastatin.  5. Acquired hypothyroidism TSH normal at last check. Recommend we reassess this in 6 months.  6. Prediabetes Stable. Low risk for progression.  Haydee Salter, MD

## 2021-11-25 ENCOUNTER — Other Ambulatory Visit: Payer: Self-pay | Admitting: Family Medicine

## 2021-11-25 DIAGNOSIS — E039 Hypothyroidism, unspecified: Secondary | ICD-10-CM

## 2021-12-02 ENCOUNTER — Ambulatory Visit (INDEPENDENT_AMBULATORY_CARE_PROVIDER_SITE_OTHER): Payer: Medicare Other

## 2021-12-02 DIAGNOSIS — Z0000 Encounter for general adult medical examination without abnormal findings: Secondary | ICD-10-CM | POA: Diagnosis not present

## 2021-12-02 NOTE — Progress Notes (Signed)
? ?Subjective:  ? [name] is a 76 y.o. male who presents for an Initial Medicare Annual Wellness Visit. ? ?I connected with [name] today by telephone and verified that I am speaking with the correct person using two identifiers. ?Location patient: home ?Location provider: work ?Persons participating in the virtual visit: patient, provider. ?  ?I discussed the limitations, risks, security and privacy concerns of performing an evaluation and management service by telephone and the availability of in person appointments. I also discussed with the patient that there may be a patient responsible charge related to this service. The patient expressed understanding and verbally consented to this telephonic visit.  ?  ?Interactive audio and video telecommunications were attempted between this provider and patient, however failed, due to patient having technical difficulties OR patient did not have access to video capability.  We continued and completed visit with audio only. ? ?  ?Review of Systems    ? ?Cardiac Risk Factors include: advanced age (>49mn, >>52women);male gender ? ?   ?Objective:  ?  ?Today's Vitals  ? ?There is no height or weight on file to calculate BMI. ? ? ?  12/02/2021  ?  3:21 PM 03/05/2021  ?  9:59 PM 01/06/2021  ?  2:33 PM 08/11/2020  ?  4:42 PM 08/01/2020  ?  8:54 AM  ?Advanced Directives  ?Does Patient Have a Medical Advance Directive? Yes No No Yes Yes  ?Type of [place]   [place] will  ?Does patient want to make changes to medical advance directive?    No - Patient declined   ?Copy of [place] Chart? No - copy requested   No - copy requested   ?Would patient like information on creating a medical advance directive?  No - Patient declined     ? ? ?Current Medications (verified) ?Outpatient Encounter Medications as of 12/02/2021  ?Medication Sig  ?  allopurinol (ZYLOPRIM) 100 MG tablet TAKE 1 TABLET(100 MG) BY MOUTH TWICE DAILY  ? carvedilol (COREG) 25 MG tablet TAKE 1 TABLET(25 MG) BY MOUTH TWICE DAILY  ? Coenzyme Q10 (COQ10 PO) Take 1 Dose by mouth daily. Liquid form  ? folic acid (FOLVITE) 1 MG tablet Take 1 mg by mouth daily.  ? indomethacin (INDOCIN) 25 MG capsule Take by mouth.  ? levothyroxine (SYNTHROID) 88 MCG tablet TAKE 1 TABLET(88 MCG) BY MOUTH DAILY BEFORE AND BREAKFAST  ? rosuvastatin (CRESTOR) 10 MG tablet TAKE 1 TABLET(10 MG) BY MOUTH DAILY  ? ?No facility-administered encounter medications on file as of 12/02/2021.  ? ? ?Allergies (verified) ?Patient has no known allergies.  ? ?History: ?Past Medical History:  ?Diagnosis Date  ? Arthritis   ? knees. back  ? Bladder cancer ([place]   ? 2018  ? High cholesterol   ? Hypertension   ? Hypothyroidism   ? prostate cancer   ? 2000, 2003  ? Thyroid disease   ? ?Past Surgical History:  ?Procedure Laterality Date  ? EYE SURGERY Bilateral 2014  ? HERNIA REPAIR Right 1984  ? inguinal  ? IR RADIOLOGIST EVAL & MGMT  03/19/2021  ? KNEE ARTHROSCOPY Left 1966  ? PENILE PROSTHESIS IMPLANT  2014  ? TOTAL KNEE ARTHROPLASTY Right 08/11/2020  ? Procedure: TOTAL KNEE ARTHROPLASTY;  Surgeon: AGaynelle Arabian MD;  Location: [facility];  Service: Orthopedics;  Laterality: Right;  522m  ? TRANSURETHRAL RESECTION OF BLADDER TUMOR  2018  ? ?  Family History  ?Problem Relation Age of Onset  ? Cancer Neg Hx   ? ?Social History  ? ?Socioeconomic History  ? Marital status: Married  ?  Spouse name: Not on file  ? Number of children: Not on file  ? Years of education: Not on file  ? Highest education level: Not on file  ?Occupational History  ? Occupation: Clinical research associate for a [employer]  ?Tobacco Use  ? Smoking status: Never  ? Smokeless tobacco: Never  ?Vaping Use  ? Vaping Use: Former  ? Start date: 08/01/2017  ? Substances: THC  ?Substance and Sexual Activity  ? Alcohol use: Yes  ?  Alcohol/week: 2.0 standard drinks  ?  Types: 2 Standard  drinks or equivalent per week  ? Drug use: Not Currently  ?  Types: Marijuana  ?  Comment: none in at least 1 year  ? Sexual activity: Yes  ?Other Topics Concern  ? Not on file  ?Social History Narrative  ? Not on file  ? ?Social Determinants of Health  ? ?Financial Resource Strain: Low Risk   ? Difficulty of Paying Living Expenses: Not hard at all  ?Food Insecurity: No Food Insecurity  ? Worried About Charity fundraiser in the Last Year: Never true  ? Ran Out of Food in the Last Year: Never true  ?Transportation Needs: No Transportation Needs  ? Lack of Transportation (Medical): No  ? Lack of Transportation (Non-Medical): No  ?Physical Activity: Insufficiently Active  ? Days of Exercise per Week: 3 days  ? Minutes of Exercise per Session: 20 min  ?Stress: No Stress Concern Present  ? Feeling of Stress : Not at all  ?Social Connections: Moderately Integrated  ? Frequency of Communication with Friends and Family: Twice a week  ? Frequency of Social Gatherings with Friends and Family: Twice a week  ? Attends Religious Services: More than 4 times per year  ? Active Member of Clubs or Organizations: No  ? Attends Archivist Meetings: Never  ? Marital Status: Married  ? ? ?Tobacco Counseling ?Counseling given: Not Answered ? ? ?Clinical Intake: ? ?Pre-visit preparation completed: Yes ? ?Pain : No/denies pain ? ?  ? ?Nutritional Risks: None ?Diabetes: No ? ?How often do you need to have someone help you when you read instructions, pamphlets, or other written materials from your doctor or pharmacy?: 1 - Never ?What is the last grade level you completed in school?: college ? ?Diabetic?no  ? ?Interpreter Needed?: No ? ?Information entered by :: [email or web address] ? ? ?Activities of Daily Living ? ?  12/02/2021  ?  3:24 PM 03/05/2021  ? 10:35 PM  ?In your present state of health, do you have any difficulty performing the following activities:  ?Hearing? 0 0  ?Vision? 0 0  ?Difficulty concentrating or making decisions? 0 0   ?Walking or climbing stairs? 0 0  ?Dressing or bathing? 0 0  ?Doing errands, shopping? 0 0  ?Preparing Food and eating ? N   ?Using the Toilet? N   ?In the past six months, have you accidently leaked urine? N   ?Do you have problems with loss of bowel control? N   ?Managing your Medications? N   ?Managing your Finances? N   ?Housekeeping or managing your Housekeeping? N   ? ? ?Patient Care Team: ?Haydee Salter, MD as PCP - General (Family Medicine) ?Elouise Munroe, MD as Consulting Physician (Cardiology) ? ?Indicate any recent Medical Services you may have received  from other than [facility] providers in the past year (date may be approximate). ? ?   ?Assessment:  ? This is a routine wellness examination for [name]. ? ?Hearing/Vision screen ?Vision Screening - Comments:: Annual eye exams wear glasses  ? ?Dietary issues and exercise activities discussed: ?Current Exercise Habits: Home exercise routine, Type of exercise: walking, Time (Minutes): 30, Frequency (Times/Week): 3, Weekly Exercise (Minutes/Week): 90, Intensity: Mild, Exercise limited by: None identified ? ? Goals Addressed   ?None ?  ? ?Depression Screen ? ?  12/02/2021  ?  3:22 PM 12/02/2021  ?  3:20 PM 10/14/2021  ?  8:24 AM 07/15/2021  ?  2:01 PM 01/05/2021  ? 10:34 AM 10/31/2020  ?  3:08 PM 10/12/2019  ?  3:39 PM  ?PHQ 2/9 Scores  ?PHQ - 2 Score 0 0 0 0 0 0 0  ?PHQ- 9 Score    1     ?  ?Fall Risk ? ?  12/02/2021  ?  3:22 PM 10/14/2021  ?  8:25 AM 02/27/2021  ?  3:31 PM 01/05/2021  ? 10:34 AM 10/31/2020  ?  3:08 PM  ?Fall Risk   ?Falls in the past year? 0 0 0 0   ?Number falls in past yr: 0 0 0  0  ?Injury with Fall? 0 0 0  0  ?Risk for fall due to :  No Fall Risks     ?Follow up Falls evaluation completed Falls evaluation completed     ? ? ?FALL RISK PREVENTION PERTAINING TO THE HOME: ? ?Any stairs in or around the home? Yes  ?If so, are there any without handrails? No  ?Home free of loose throw rugs in walkways, pet beds, electrical cords, etc? Yes  ?Adequate lighting in  your home to reduce risk of falls? Yes  ? ?ASSISTIVE DEVICES UTILIZED TO PREVENT FALLS: ? ?Life alert? No  ?Use of a cane, walker or w/c? No  ?Grab bars in the bathroom? No  ?Shower chair or bench in shower? No

## 2021-12-02 NOTE — Patient Instructions (Signed)
Mr. Campillo , ?Thank you for taking time to come for your Medicare Wellness Visit. I appreciate your ongoing commitment to your health goals. Please review the following plan we discussed and let me know if I can assist you in the future.  ? ?Screening recommendations/referrals: ?Colonoscopy: 08/02/2019 ?Recommended yearly ophthalmology/optometry visit for glaucoma screening and checkup ?Recommended yearly dental visit for hygiene and checkup ? ?Vaccinations: ?Influenza vaccine: completed  ?Pneumococcal vaccine: completed  ?Tdap vaccine: due  ?Shingles vaccine: will consider    ? ?Advanced directives: none  ? ?Conditions/risks identified: none  ? ?Next appointment: none  ? ?Preventive Care 8 Years and Older, Male ?Preventive care refers to lifestyle choices and visits with your health care provider that can promote health and wellness. ?What does preventive care include? ?A yearly physical exam. This is also called an annual well check. ?Dental exams once or twice a year. ?Routine eye exams. Ask your health care provider how often you should have your eyes checked. ?Personal lifestyle choices, including: ?Daily care of your teeth and gums. ?Regular physical activity. ?Eating a healthy diet. ?Avoiding tobacco and drug use. ?Limiting alcohol use. ?Practicing safe sex. ?Taking low doses of aspirin every day. ?Taking vitamin and mineral supplements as recommended by your health care provider. ?What happens during an annual well check? ?The services and screenings done by your health care provider during your annual well check will depend on your age, overall health, lifestyle risk factors, and family history of disease. ?Counseling  ?Your health care provider may ask you questions about your: ?Alcohol use. ?Tobacco use. ?Drug use. ?Emotional well-being. ?Home and relationship well-being. ?Sexual activity. ?Eating habits. ?History of falls. ?Memory and ability to understand (cognition). ?Work and work  Statistician. ?Screening  ?You may have the following tests or measurements: ?Height, weight, and BMI. ?Blood pressure. ?Lipid and cholesterol levels. These may be checked every 5 years, or more frequently if you are over 34 years old. ?Skin check. ?Lung cancer screening. You may have this screening every year starting at age 15 if you have a 30-pack-year history of smoking and currently smoke or have quit within the past 15 years. ?Fecal occult blood test (FOBT) of the stool. You may have this test every year starting at age 50. ?Flexible sigmoidoscopy or colonoscopy. You may have a sigmoidoscopy every 5 years or a colonoscopy every 10 years starting at age 45. ?Prostate cancer screening. Recommendations will vary depending on your family history and other risks. ?Hepatitis C blood test. ?Hepatitis B blood test. ?Sexually transmitted disease (STD) testing. ?Diabetes screening. This is done by checking your blood sugar (glucose) after you have not eaten for a while (fasting). You may have this done every 1-3 years. ?Abdominal aortic aneurysm (AAA) screening. You may need this if you are a current or former smoker. ?Osteoporosis. You may be screened starting at age 60 if you are at high risk. ?Talk with your health care provider about your test results, treatment options, and if necessary, the need for more tests. ?Vaccines  ?Your health care provider may recommend certain vaccines, such as: ?Influenza vaccine. This is recommended every year. ?Tetanus, diphtheria, and acellular pertussis (Tdap, Td) vaccine. You may need a Td booster every 10 years. ?Zoster vaccine. You may need this after age 30. ?Pneumococcal 13-valent conjugate (PCV13) vaccine. One dose is recommended after age 4. ?Pneumococcal polysaccharide (PPSV23) vaccine. One dose is recommended after age 16. ?Talk to your health care provider about which screenings and vaccines you need and how often  you need them. ?This information is not intended to replace  advice given to you by your health care provider. Make sure you discuss any questions you have with your health care provider. ?Document Released: 09/12/2015 Document Revised: 05/05/2016 Document Reviewed: 06/17/2015 ?Elsevier Interactive Patient Education ? 2017 [place]. ? ?Fall Prevention in the Home ?Falls can cause injuries. They can happen to people of all ages. There are many things you can do to make your home safe and to help prevent falls. ?What can I do on the outside of my home? ?Regularly fix the edges of walkways and driveways and fix any cracks. ?Remove anything that might make you trip as you walk through a door, such as a raised step or threshold. ?Trim any bushes or trees on the path to your home. ?Use bright outdoor lighting. ?Clear any walking paths of anything that might make someone trip, such as rocks or tools. ?Regularly check to see if handrails are loose or broken. Make sure that both sides of any steps have handrails. ?Any raised decks and porches should have guardrails on the edges. ?Have any leaves, snow, or ice cleared regularly. ?Use sand or salt on walking paths during winter. ?Clean up any spills in your garage right away. This includes oil or grease spills. ?What can I do in the bathroom? ?Use night lights. ?Install grab bars by the toilet and in the tub and shower. Do not use towel bars as grab bars. ?Use non-skid mats or decals in the tub or shower. ?If you need to sit down in the shower, use a plastic, non-slip stool. ?Keep the floor dry. Clean up any water that spills on the floor as soon as it happens. ?Remove soap buildup in the tub or shower regularly. ?Attach bath mats securely with double-sided non-slip rug tape. ?Do not have throw rugs and other things on the floor that can make you trip. ?What can I do in the bedroom? ?Use night lights. ?Make sure that you have a light by your bed that is easy to reach. ?Do not use any sheets or blankets that are too big for your bed.  They should not hang down onto the floor. ?Have a firm chair that has side arms. You can use this for support while you get dressed. ?Do not have throw rugs and other things on the floor that can make you trip. ?What can I do in the kitchen? ?Clean up any spills right away. ?Avoid walking on wet floors. ?Keep items that you use a lot in easy-to-reach places. ?If you need to reach something above you, use a strong step stool that has a grab bar. ?Keep electrical cords out of the way. ?Do not use floor polish or wax that makes floors slippery. If you must use wax, use non-skid floor wax. ?Do not have throw rugs and other things on the floor that can make you trip. ?What can I do with my stairs? ?Do not leave any items on the stairs. ?Make sure that there are handrails on both sides of the stairs and use them. Fix handrails that are broken or loose. Make sure that handrails are as long as the stairways. ?Check any carpeting to make sure that it is firmly attached to the stairs. Fix any carpet that is loose or worn. ?Avoid having throw rugs at the top or bottom of the stairs. If you do have throw rugs, attach them to the floor with carpet tape. ?Make sure that you have a light  switch at the top of the stairs and the bottom of the stairs. If you do not have them, ask someone to add them for you. ?What else can I do to help prevent falls? ?Wear shoes that: ?Do not have high heels. ?Have rubber bottoms. ?Are comfortable and fit you well. ?Are closed at the toe. Do not wear sandals. ?If you use a stepladder: ?Make sure that it is fully opened. Do not climb a closed stepladder. ?Make sure that both sides of the stepladder are locked into place. ?Ask someone to hold it for you, if possible. ?Clearly mark and make sure that you can see: ?Any grab bars or handrails. ?First and last steps. ?Where the edge of each step is. ?Use tools that help you move around (mobility aids) if they are needed. These  include: ?Canes. ?Walkers. ?Scooters. ?Crutches. ?Turn on the lights when you go into a dark area. Replace any light bulbs as soon as they burn out. ?Set up your furniture so you have a clear path. Avoid moving your furniture around. ?If any of

## 2022-01-11 ENCOUNTER — Ambulatory Visit: Payer: Medicare Other | Admitting: Family Medicine

## 2022-01-15 ENCOUNTER — Ambulatory Visit: Payer: Medicare Other | Admitting: Internal Medicine

## 2022-03-10 ENCOUNTER — Encounter: Payer: Self-pay | Admitting: Family Medicine

## 2022-03-10 ENCOUNTER — Ambulatory Visit (INDEPENDENT_AMBULATORY_CARE_PROVIDER_SITE_OTHER): Payer: Medicare Other

## 2022-03-10 ENCOUNTER — Ambulatory Visit (INDEPENDENT_AMBULATORY_CARE_PROVIDER_SITE_OTHER): Payer: Medicare Other | Admitting: Family Medicine

## 2022-03-10 VITALS — BP 124/68 | HR 49 | Temp 96.8°F | Ht 72.0 in | Wt 239.8 lb

## 2022-03-10 DIAGNOSIS — M545 Low back pain, unspecified: Secondary | ICD-10-CM

## 2022-03-10 DIAGNOSIS — Z8546 Personal history of malignant neoplasm of prostate: Secondary | ICD-10-CM | POA: Diagnosis not present

## 2022-03-10 DIAGNOSIS — M4716 Other spondylosis with myelopathy, lumbar region: Secondary | ICD-10-CM

## 2022-03-10 DIAGNOSIS — R600 Localized edema: Secondary | ICD-10-CM

## 2022-03-10 LAB — PSA: PSA: 0 ng/mL — ABNORMAL LOW (ref 0.10–4.00)

## 2022-03-10 MED ORDER — PREDNISONE 20 MG PO TABS: 20.0000 mg | ORAL_TABLET | Freq: Every day | ORAL | 0 refills | Status: DC

## 2022-03-10 NOTE — Progress Notes (Unsigned)
Initial visit for LBP. Pt states "it is excurciating" Denies injury.  All views standing. Hard for pt not to move during exam. Needed to sit between each view.

## 2022-03-10 NOTE — Progress Notes (Signed)
[facility] [address] [place] [address] Dept: [phone] Dept Fax: [phone]  Office Visit  Subjective:    Patient ID: [name], male    DOB: [birth year], 76 y.o..   MRN: [id number]  Chief Complaint  Patient presents with   Acute Visit    C/o having low back pain radiating into Rt leg/thigh x 1 week.  Also having RT leg swelling.  Has taken Tylenol & Aleve alternating and went to Chiropractor (x2).      History of Present Illness:  Patient is in today for evaluation of a recent flare of low back pain. Mr. Mcmanaway notes that he has had problems with his back for many years. He typically sees a Restaurant manager, fast food and this has been able to manage his issue. His current flare started a week ago. He has been seeing his chiropractor daily without relief. He also notes that about 3 days ago, he developed some pain in the anterior right thigh. He notes stiffness after sitting that improves once he gets up and moves around. He denies any fever, bowel/bladder issues, unintended weight loss, or new areas of numbness/tingling in his lower legs. He does have a past history of prostate cancer.  Additionally, Mr. Burkhead had a history of edema in the right lower leg. He notes his wife has been concerned about this. It has not necessarily changed more recently. He has hypertension, but this has been well managed. He recalls having an ultrasound of the leg some years ago, but was told this was normal.  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    Thrombocytopenia ([place]) 05/03/2019   Gout 05/03/2019   Personal history of prostate cancer 05/03/2019   Obesity (BMI 30.0-34.9) 04/26/2019   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   Past Surgical History:  Procedure Laterality Date   EYE SURGERY Bilateral 2014   HERNIA REPAIR Right 1984   inguinal   IR RADIOLOGIST EVAL & MGMT  03/19/2021   KNEE ARTHROSCOPY Left 1966   PENILE PROSTHESIS IMPLANT  2014   TOTAL KNEE ARTHROPLASTY Right 08/11/2020   Procedure: TOTAL KNEE ARTHROPLASTY;  Surgeon: Gaynelle Arabian, MD;  Location: [facility];  Service: Orthopedics;  Laterality: Right;  46mn   TRANSURETHRAL RESECTION OF BLADDER TUMOR  2018   Family History  Problem Relation Age of Onset   Cancer Neg Hx    Outpatient Medications Prior to Visit  Medication Sig Dispense Refill   allopurinol (ZYLOPRIM) 100 MG tablet TAKE 1 TABLET(100 MG) BY MOUTH TWICE DAILY 180 tablet 0   carvedilol (COREG) 25 MG tablet TAKE 1 TABLET(25 MG) BY MOUTH TWICE DAILY 180 tablet 3   Coenzyme Q10 (COQ10 PO) Take 1 Dose by mouth daily. Liquid form     folic acid (FOLVITE) 1 MG tablet Take 1 mg by mouth daily.     indomethacin (INDOCIN) 25 MG capsule Take by mouth.     levothyroxine (SYNTHROID) 88 MCG tablet TAKE 1 TABLET(88 MCG) BY MOUTH DAILY BEFORE AND BREAKFAST 90 tablet 1   rosuvastatin (CRESTOR) 10 MG tablet TAKE 1 TABLET(10 MG) BY MOUTH DAILY 90 tablet  3   No facility-administered medications prior to visit.   No Known Allergies    Objective:   Today's Vitals   03/10/22 1032  BP: 124/68  Pulse: (!) 49  Temp: (!) 96.8 F (36 C)  TempSrc: Temporal  SpO2: 97%  Weight: 239 lb 12.8 oz (108.8 kg)  Height: 6' (1.829 m)   Body mass index is 32.52 kg/m.   General: Well developed, well nourished. No acute distress. Back: Straight. No tenderness to palpation. Extremities: Full ROM. Strength 5/5. SLR-. Sensation normal. There is 1-2+ edema of the right lower leg.   There are a few varicose  veins present. Psych: Alert and oriented. Normal mood and affect.  Health Maintenance Due  Topic Date Due   Hepatitis C Screening  Never done   TETANUS/TDAP  Never done   Zoster Vaccines- Shingrix (1 of 2) Never done   Imaging: Lumbar spine- Marked osteophytes and cross bridging between vertebrae of the lumbar spine.    Assessment & Plan:   1. Acute right-sided low back pain, unspecified whether sciatica present 2. Osteoarthritis of lumbar spine with myelopathy The lumbar x-rays do demonstrate some considerable arthritic changes to the lumbar spine. As he has some anterior thigh pain, I will give a trial of prednisone to see if this improves the condition. I will plan to see him back in 1 month. If pain persists, I would consider a neurosurgery referral in light of the significant lower back disease seen on x-ray.  - DG Lumbar Spine Complete - predniSONE (DELTASONE) 20 MG tablet; Take 1 tablet (20 mg total) by mouth daily with breakfast.  Dispense: 7 tablet; Refill: 0 3. Personal history of prostate cancer I do not see any evidence for lytic lesions in the back. I will check a PSA to assure no evidence for prostate cancer recurrence/metastasis.  - PSA  4. Leg edema, right I suspect this represents venous insufficiency. I will refer him to vascular for confirmation and to review a management plan.  - Ambulatory referral to Vascular Surgery   Return in about 4 weeks (around 04/07/2022) for Reassessment.   Haydee Salter, MD

## 2022-03-14 ENCOUNTER — Encounter: Payer: Self-pay | Admitting: Family Medicine

## 2022-03-14 DIAGNOSIS — M545 Low back pain, unspecified: Secondary | ICD-10-CM

## 2022-03-15 ENCOUNTER — Telehealth: Payer: Self-pay | Admitting: Family Medicine

## 2022-03-15 MED ORDER — METHOCARBAMOL 500 MG PO TABS: 500.0000 mg | ORAL_TABLET | Freq: Three times a day (TID) | ORAL | 0 refills | Status: DC | PRN

## 2022-03-15 NOTE — Telephone Encounter (Signed)
Per patient's request. Disk has been placed up front & ready for pick up

## 2022-03-15 NOTE — Telephone Encounter (Signed)
Pt is wanting his most recent x-ray put on a disc to pick up. He wants to pick up by tomorrow 03/16/22. Please advise pt at [phone]

## 2022-03-16 ENCOUNTER — Other Ambulatory Visit: Payer: Self-pay | Admitting: Family Medicine

## 2022-03-16 DIAGNOSIS — M545 Low back pain, unspecified: Secondary | ICD-10-CM

## 2022-03-16 DIAGNOSIS — M4716 Other spondylosis with myelopathy, lumbar region: Secondary | ICD-10-CM

## 2022-03-22 ENCOUNTER — Encounter: Payer: Self-pay | Admitting: Family Medicine

## 2022-04-07 ENCOUNTER — Ambulatory Visit: Payer: Medicare Other | Admitting: Family Medicine

## 2022-04-13 ENCOUNTER — Encounter: Payer: Self-pay | Admitting: Family Medicine

## 2022-04-14 ENCOUNTER — Encounter: Payer: Self-pay | Admitting: Family Medicine

## 2022-04-15 ENCOUNTER — Encounter: Payer: Self-pay | Admitting: Family Medicine

## 2022-04-20 ENCOUNTER — Encounter: Payer: Self-pay | Admitting: Family Medicine

## 2022-04-20 ENCOUNTER — Ambulatory Visit (INDEPENDENT_AMBULATORY_CARE_PROVIDER_SITE_OTHER): Payer: Medicare Other | Admitting: Family Medicine

## 2022-04-20 VITALS — BP 140/80 | HR 50 | Temp 97.3°F | Ht 72.0 in | Wt 231.2 lb

## 2022-04-20 DIAGNOSIS — I10 Essential (primary) hypertension: Secondary | ICD-10-CM

## 2022-04-20 DIAGNOSIS — Z01818 Encounter for other preprocedural examination: Secondary | ICD-10-CM | POA: Diagnosis not present

## 2022-04-20 DIAGNOSIS — D696 Thrombocytopenia, unspecified: Secondary | ICD-10-CM

## 2022-04-20 DIAGNOSIS — E039 Hypothyroidism, unspecified: Secondary | ICD-10-CM | POA: Diagnosis not present

## 2022-04-20 LAB — COMPREHENSIVE METABOLIC PANEL
ALT: 26 U/L (ref 0–53)
AST: 27 U/L (ref 0–37)
Albumin: 3.9 g/dL (ref 3.5–5.2)
Alkaline Phosphatase: 74 U/L (ref 39–117)
BUN: 26 mg/dL — ABNORMAL HIGH (ref 6–23)
CO2: 25 mEq/L (ref 19–32)
Calcium: 10.7 mg/dL — ABNORMAL HIGH (ref 8.4–10.5)
Chloride: 104 mEq/L (ref 96–112)
Creatinine, Ser: 1.18 mg/dL (ref 0.40–1.50)
GFR: 60.07 mL/min (ref >60.00)
Glucose, Bld: 88 mg/dL (ref 70–99)
Potassium: 4.3 mEq/L (ref 3.5–5.1)
Sodium: 137 mEq/L (ref 135–145)
Total Bilirubin: 0.5 mg/dL (ref 0.2–1.2)
Total Protein: 7 g/dL (ref 6.0–8.3)

## 2022-04-20 LAB — CBC
HCT: 45.9 % (ref 39.0–52.0)
Hemoglobin: 15.3 g/dL (ref 13.0–17.0)
MCHC: 33.3 g/dL (ref 30.0–36.0)
MCV: 91.3 fl (ref 78.0–100.0)
Platelets: 132 10*3/uL — ABNORMAL LOW (ref 150.0–400.0)
RBC: 5.03 Mil/uL (ref 4.22–5.81)
RDW: 14.9 % (ref 11.5–15.5)
WBC: 8.9 10*3/uL (ref 4.0–10.5)

## 2022-04-20 LAB — TSH: TSH: 1.38 u[IU]/mL (ref 0.35–5.50)

## 2022-04-20 NOTE — Progress Notes (Signed)
[facility] [address] [place] [address] Dept: [phone] Dept Fax: [phone]  Office Visit  Subjective:    Patient ID: [name], male    DOB: 1946/03/22, 76 y.o..   MRN: [id number]  Chief Complaint  Patient presents with   Pre-op Exam    Pre-Op  for back surgery.      History of Present Illness:  Patient is in today for preoperative clearance for an L3-L4 lumbar decompression. He saw Dr. Tonita Cong in the past month due to a recent flare of lower back pain.  [name] has a history of hypertension. He is managed on carvedilol 25 mg bid.    [name] has a history of hypothyroidism. He is managed on levothyroxine 88 mcg daily.   [name] has a history of hyperlipidemia. He is managed on rosuvastatin 10 mg daily.  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   Past Surgical History:  Procedure Laterality Date   EYE SURGERY Bilateral 2014   HERNIA REPAIR Right 1984   inguinal   IR RADIOLOGIST EVAL & MGMT  03/19/2021   KNEE ARTHROSCOPY Left 1966   PENILE PROSTHESIS IMPLANT  2014   TOTAL KNEE ARTHROPLASTY Right 08/11/2020   Procedure: TOTAL KNEE ARTHROPLASTY;  Surgeon: Gaynelle Arabian, MD;  Location: [facility];  Service: Orthopedics;  Laterality: Right;  81mn   TRANSURETHRAL RESECTION OF BLADDER TUMOR  2018   Family History  Problem Relation Age  of Onset   Cancer Neg Hx    Outpatient Medications Prior to Visit  Medication Sig Dispense Refill   allopurinol (ZYLOPRIM) 100 MG tablet TAKE 1 TABLET(100 MG) BY MOUTH TWICE DAILY 180 tablet 0   carvedilol (COREG) 25 MG tablet TAKE 1 TABLET(25 MG) BY MOUTH TWICE DAILY 180 tablet 3   Coenzyme Q10 (COQ10 PO) Take 1 Dose by mouth daily. Liquid form     Febuxostat (ULORIC) 80 MG TABS Take 1 tablet every day by oral route.     folic acid (FOLVITE) 1 MG tablet Take 1 mg by mouth daily.     gabapentin (NEURONTIN) 300 MG capsule take 1 cap QHS x 3 days then 1 cap BID x 3 days then 1 cap TID     indomethacin (INDOCIN) 25 MG capsule Take by mouth.     levothyroxine (SYNTHROID) 88 MCG tablet TAKE 1 TABLET(88 MCG) BY MOUTH DAILY BEFORE AND BREAKFAST 90 tablet 1   methocarbamol (ROBAXIN) 500 MG tablet Take 1 tablet (500 mg total) by mouth every 8 (eight) hours as needed for muscle spasms. 21 tablet 0   rosuvastatin (CRESTOR) 10 MG tablet TAKE 1 TABLET(10 MG) BY MOUTH DAILY 90 tablet 3   predniSONE (DELTASONE) 20 MG tablet Take 1 tablet (20 mg total) by mouth daily with breakfast. 7 tablet 0   No facility-administered medications prior to visit.   No Known Allergies    Objective:   Today's Vitals   04/20/22 1335  BP: (!) 140/80  Pulse: (!) 50  Temp: (!) 97.3 F (36.3 C)  TempSrc: Temporal  SpO2: 96%  Weight: 231 lb 3.2 oz (104.9 kg)  Height: 6' (1.829 m)   Body mass index is 31.36 kg/m.   General: Well developed, well nourished. No acute distress. HEENT: Normocephalic, non-traumatic. PERRL, EOMI. Conjunctiva clear. External ears normal.   EAC and TMs normal bilaterally. Nose clear without congestion or rhinorrhea. Mucous   membranes moist. Oropharynx clear. Good dentition. Neck: Supple. No lymphadenopathy. No thyromegaly. Lungs: Clear to auscultation bilaterally. No wheezing, rales or rhonchi. CV: RRR with a II/VI holosystolic murmur. Pulses 2+ bilaterally. Abdomen: Soft, non-tender.  Bowel sounds positive, normal pitch and frequency. No   hepatosplenomegaly. No rebound or guarding. Extremities: Full ROM. No joint swelling or tenderness. No edema noted. Skin: Warm and dry. No rashes. Psych: Alert and oriented. Normal mood and affect.  Health Maintenance Due  Topic Date Due   Hepatitis C Screening  Never done   TETANUS/TDAP  Never done   Zoster Vaccines- Shingrix (1 of 2) Never done   COVID-19 Vaccine (4 - Pfizer risk series) 10/31/2020   INFLUENZA VACCINE  03/30/2022   Imaging: Echocardiogram (02/13/2021) IMPRESSIONS   1. Left ventricular ejection fraction, by estimation, is 60 to 65%. The left ventricle has normal function. The left ventricle has no regional wall motion abnormalities. Left ventricular diastolic parameters are consistent with Grade I diastolic dysfunction (impaired relaxation).   2. Right ventricular systolic function is mildly reduced. The right ventricular size is normal.   3. 3.5 x 6.5 cm echolucent oval structure adjacent to the posterior wall -apperas extracardia - seems to compress the RV some. Consider pericardial cyst, hiatal hernia or other extracardiac masses such as hepatic mass/cyst - cross-sectional imaging such  as CT may be helpful.   4. The mitral valve is abnormal. Trivial mitral valve regurgitation.   5. The aortic valve is calcified. Aortic valve regurgitation is not visualized. Mild aortic valve stenosis. Aortic valve area, by VTI measures 1.71 cm. Aortic valve mean gradient measures 13.0 mmHg. Aortic valve Vmax measures 2.41 m/s. Peak gradient 23.2 mmHg. DI is 0.54.   6. The inferior vena cava is normal in size with greater than 50% respiratory variability, suggesting right atrial pressure of 3 mmHg.   Lab Results Last CBC Lab Results  Component Value Date   WBC 4.5 07/17/2021   HGB 14.5 07/17/2021   HCT 43.5 07/17/2021   MCV 87.7 07/17/2021   MCH 26.4 03/09/2021   RDW 14.9 07/17/2021   PLT 136.0 (L) 49/17/9150   Last  metabolic panel Lab Results  Component Value Date   GLUCOSE 105 (H) 07/17/2021   NA 139 07/17/2021   K 4.9 07/17/2021   CL 105 07/17/2021   CO2 28 07/17/2021   BUN 22 07/17/2021   CREATININE 1.07 07/17/2021   GFRNONAA >60 03/07/2021   CALCIUM 10.1 07/17/2021   PROT 6.6 07/17/2021   ALBUMIN 3.9 07/17/2021   BILITOT 0.4 07/17/2021   ALKPHOS 86 07/17/2021   AST 19 07/17/2021   ALT 16 07/17/2021   ANIONGAP 9 03/07/2021   Last lipids Lab Results  Component Value Date   CHOL 136 07/17/2021   HDL 43.30 07/17/2021   LDLCALC 56 07/17/2021   LDLDIRECT 81 01/11/2020   TRIG 187.0 (H) 07/17/2021   CHOLHDL 3 07/17/2021   Last thyroid functions Lab Results  Component Value Date   TSH 4.28 07/17/2021   Assessment & Plan:   1. Preoperative examination Overall, [name]  is a good candidate for general anesthesia. I will check some follow up labs to make sure he has sustained improvements after his abdominal abscess last year. If these are normal, I will plan to clear him for surgery.  - Comprehensive metabolic panel  2. Essential hypertension Blood pressure is mildly high, but likely impacted by his current pain. I feel comfortable that this will not preclude his surgery. He will continue on carvedilol 25 mg bid.  - Comprehensive metabolic panel  3. Acquired hypothyroidism We will reassess his TSH. Assuming stability, we would continue levothyroxine 88 mcg daily.  - TSH  4. Thrombocytopenia ([facility]) Past history of thrombocytopenia. I will reassess his current platelet level. - CBC   Return if symptoms worsen or fail to improve.   Haydee Salter, MD

## 2022-04-26 ENCOUNTER — Ambulatory Visit: Payer: Self-pay | Admitting: Orthopedic Surgery

## 2022-04-26 DIAGNOSIS — M48062 Spinal stenosis, lumbar region with neurogenic claudication: Secondary | ICD-10-CM

## 2022-04-28 ENCOUNTER — Ambulatory Visit: Payer: Self-pay | Admitting: Orthopedic Surgery

## 2022-04-28 NOTE — H&P (View-Only) (Signed)
[name] is an 76 y.o. male.   Chief Complaint: back and leg pain, weakness HPI: Reason for Visit: (normal) review of test results (lumbar MRI); 8 weeks NKI Location (Lower Extremity): leg pain bilateral, both are the same, posterior Severity: pain level 2/10 Timing: constant Quality: aching Aggravating Factors: standing for ; walking for Associated Symptoms: weakness (BLE) Medications: Gabapentin '300mg'$  Notes: He is here with his son Theresia Lo today. He reports gabapentin has helped significantly, no longer having back pain, and the leg pain is less severe. He reports aching in the legs still, can only stand 10-15 min until he needs to sit or lay for relief. Per his son he has been walking in forward flexion for quite some time.  Past Medical History:  Diagnosis Date   Arthritis    knees. back   Bladder cancer ([place])    2018   High cholesterol    Hypertension    Hypothyroidism    prostate cancer    2000, 2003   Thyroid disease     Past Surgical History:  Procedure Laterality Date   EYE SURGERY Bilateral 2014   HERNIA REPAIR Right 1984   inguinal   IR RADIOLOGIST EVAL & MGMT  03/19/2021   KNEE ARTHROSCOPY Left 1966   PENILE PROSTHESIS IMPLANT  2014   TOTAL KNEE ARTHROPLASTY Right 08/11/2020   Procedure: TOTAL KNEE ARTHROPLASTY;  Surgeon: Gaynelle Arabian, MD;  Location: [facility];  Service: Orthopedics;  Laterality: Right;  1mn   TRANSURETHRAL RESECTION OF BLADDER TUMOR  2018    Family History  Problem Relation Age of Onset   Cancer Neg Hx    Social History:  reports that he has never smoked. He has never used smokeless tobacco. He reports current alcohol use of about 2.0 standard drinks of alcohol per week. He reports that he does not currently use drugs after having used the following drugs: Marijuana.  Allergies: No Known Allergies  Current meds: allopurinoL 100 mg tablet carvediloL 25 mg tablet CTDD-22folic acid gabapentin 3025mg capsule oxyBUTYnin chloride 5 mg  tablet rosuvastatin 10 mg tablet TylenoL Uloric 80 mg tablet  Review of Systems  Constitutional: Negative.   HENT: Negative.    Eyes: Negative.   Respiratory: Negative.    Cardiovascular: Negative.   Gastrointestinal: Negative.   Endocrine: Negative.   Genitourinary: Negative.   Musculoskeletal:  Positive for back pain and gait problem.  Neurological:  Positive for weakness and numbness.  Hematological: Negative.   Psychiatric/Behavioral: Negative.      There were no vitals taken for this visit. Physical Exam Constitutional:      Appearance: Normal appearance.  HENT:     Head: Normocephalic and atraumatic.     Right Ear: External ear normal.     Left Ear: External ear normal.     Nose: Nose normal.     Mouth/Throat:     Pharynx: Oropharynx is clear.  Eyes:     Conjunctiva/sclera: Conjunctivae normal.  Cardiovascular:     Rate and Rhythm: Normal rate and regular rhythm.     Pulses: Normal pulses.     Heart sounds: Normal heart sounds.  Pulmonary:     Effort: Pulmonary effort is normal.     Breath sounds: Normal breath sounds.  Abdominal:     General: Bowel sounds are normal.  Musculoskeletal:     Cervical back: Normal range of motion and neck supple.     Comments: WN, WD, AAOX3. Pleasant. Seated. Ambulates with a rolling seated walker  and forward flexion in the spine.  Nontender spinous processes, paraspinals, SI joints, buttocks, lateral hip. No calf pain or sign of DVT. Decreased flexion and extension lumbar spine. Seated SLR with buttock and thigh tightness bilaterally. No LE weakness noted, 5/5 throughout hip flexors, quads, hamstrings, PF, DF, EHL. Sensation intact distally. No babinski or clonus. No groin pain or limitation of ROM with IR/ER.  Neurological:     Mental Status: He is alert.    Prior xrays again reviewed by Dr. Tonita Cong. 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 No listhesis or instability noted. Disc heights well  maintained. Hips unremarkable.  MRI images and report reviewed today with severe stenosis L3-4 centrally, subarticular zone bilaterally, and central disc herniation at the same level. L4-5 with moderate stenosis and grade 1 listhesis.  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  Plan: Discussed again relevant anatomy and etiology of his symptoms. Discussed nature of spinal stenosis and tx options. Discussed that epidurals would be a temporary solution and given the significant degree of stenosis, likely underestimated on MRI due to position, may not even be effective at all. Given that, and with significant claudication symptoms, we discussed proceeding with microlumbar laminectomy L3-4, possible L4-5. We discussed the surgery itself as well as risks, complications and alternatives, including but not limited to DVT, PE, failure of procedure, need for secondary procedure, anesthesia risk, even death. Discussed typical post-op protocols, restrictions, return to work. He would like to proceed with surgery. All questions were invited and answered. Patient was seen in conjunction with Dr. Tonita Cong. He desires to proceed. We will obtain pre-op clearance from his PCP Dr. Gena Fray and then proceed accordingly.  Plan microlumbar laminectomy L3-4, possible L4-5  Cecilie Kicks, PA-C for Dr Tonita Cong 04/28/2022, 12:45 PM

## 2022-04-28 NOTE — H&P (Signed)
[name] is an 76 y.o. male.   Chief Complaint: back and leg pain, weakness HPI: Reason for Visit: (normal) review of test results (lumbar MRI); 8 weeks NKI Location (Lower Extremity): leg pain bilateral, both are the same, posterior Severity: pain level 2/10 Timing: constant Quality: aching Aggravating Factors: standing for ; walking for Associated Symptoms: weakness (BLE) Medications: Gabapentin '300mg'$  Notes: He is here with his son [name] today. He reports gabapentin has helped significantly, no longer having back pain, and the leg pain is less severe. He reports aching in the legs still, can only stand 10-15 min until he needs to sit or lay for relief. Per his son he has been walking in forward flexion for quite some time.  Past Medical History:  Diagnosis Date   Arthritis    knees. back   Bladder cancer ([place])    2018   High cholesterol    Hypertension    Hypothyroidism    prostate cancer    2000, 2003   Thyroid disease     Past Surgical History:  Procedure Laterality Date   EYE SURGERY Bilateral 2014   HERNIA REPAIR Right 1984   inguinal   IR RADIOLOGIST EVAL & MGMT  03/19/2021   KNEE ARTHROSCOPY Left 1966   PENILE PROSTHESIS IMPLANT  2014   TOTAL KNEE ARTHROPLASTY Right 08/11/2020   Procedure: TOTAL KNEE ARTHROPLASTY;  Surgeon: Gaynelle Arabian, MD;  Location: [facility];  Service: Orthopedics;  Laterality: Right;  58mn   TRANSURETHRAL RESECTION OF BLADDER TUMOR  2018    Family History  Problem Relation Age of Onset   Cancer Neg Hx    Social History:  reports that he has never smoked. He has never used smokeless tobacco. He reports current alcohol use of about 2.0 standard drinks of alcohol per week. He reports that he does not currently use drugs after having used the following drugs: Marijuana.  Allergies: No Known Allergies  Current meds: allopurinoL 100 mg tablet carvediloL 25 mg tablet COEU-23folic acid gabapentin 3536mg capsule oxyBUTYnin chloride 5 mg  tablet rosuvastatin 10 mg tablet TylenoL Uloric 80 mg tablet  Review of Systems  Constitutional: Negative.   HENT: Negative.    Eyes: Negative.   Respiratory: Negative.    Cardiovascular: Negative.   Gastrointestinal: Negative.   Endocrine: Negative.   Genitourinary: Negative.   Musculoskeletal:  Positive for back pain and gait problem.  Neurological:  Positive for weakness and numbness.  Hematological: Negative.   Psychiatric/Behavioral: Negative.      There were no vitals taken for this visit. Physical Exam Constitutional:      Appearance: Normal appearance.  HENT:     Head: Normocephalic and atraumatic.     Right Ear: External ear normal.     Left Ear: External ear normal.     Nose: Nose normal.     Mouth/Throat:     Pharynx: Oropharynx is clear.  Eyes:     Conjunctiva/sclera: Conjunctivae normal.  Cardiovascular:     Rate and Rhythm: Normal rate and regular rhythm.     Pulses: Normal pulses.     Heart sounds: Normal heart sounds.  Pulmonary:     Effort: Pulmonary effort is normal.     Breath sounds: Normal breath sounds.  Abdominal:     General: Bowel sounds are normal.  Musculoskeletal:     Cervical back: Normal range of motion and neck supple.     Comments: WN, WD, AAOX3. Pleasant. Seated. Ambulates with a rolling seated walker  and forward flexion in the spine.  Nontender spinous processes, paraspinals, SI joints, buttocks, lateral hip. No calf pain or sign of DVT. Decreased flexion and extension lumbar spine. Seated SLR with buttock and thigh tightness bilaterally. No LE weakness noted, 5/5 throughout hip flexors, quads, hamstrings, PF, DF, EHL. Sensation intact distally. No babinski or clonus. No groin pain or limitation of ROM with IR/ER.  Neurological:     Mental Status: He is alert.    Prior xrays again reviewed by [name]. 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 No listhesis or instability noted. Disc heights well  maintained. Hips unremarkable.  MRI images and report reviewed today with severe stenosis L3-4 centrally, subarticular zone bilaterally, and central disc herniation at the same level. L4-5 with moderate stenosis and grade 1 listhesis.  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  Plan: Discussed again relevant anatomy and etiology of his symptoms. Discussed nature of spinal stenosis and tx options. Discussed that epidurals would be a temporary solution and given the significant degree of stenosis, likely underestimated on MRI due to position, may not even be effective at all. Given that, and with significant claudication symptoms, we discussed proceeding with microlumbar laminectomy L3-4, possible L4-5. We discussed the surgery itself as well as risks, complications and alternatives, including but not limited to DVT, PE, failure of procedure, need for secondary procedure, anesthesia risk, even death. Discussed typical post-op protocols, restrictions, return to work. He would like to proceed with surgery. All questions were invited and answered. Patient was seen in conjunction with [name]. He desires to proceed. We will obtain pre-op clearance from his PCP [name] and then proceed accordingly.  Plan microlumbar laminectomy L3-4, possible L4-5  [name] Kicks, PA-C for Dr Tonita [name] 04/28/2022, 12:45 PM

## 2022-05-07 NOTE — Pre-Procedure Instructions (Signed)
Surgical Instructions    Your procedure is scheduled on May 13, 2022.  Report to [facility] Main Entrance "A" at 8:30 A.M., then check in with the Admitting office.  Call this number if you have problems the morning of surgery:  [phone]   If you have any questions prior to your surgery date call [phone]: Open Monday-Friday 8am-4pm    Remember:  Do not eat after midnight the night before your surgery  You may drink clear liquids until 7:30 AM the morning of your surgery.   Clear liquids allowed are: Water, Non-Citrus Juices (without pulp), Carbonated Beverages, Clear Tea, Black Coffee Only (NO MILK, CREAM OR POWDERED CREAMER of any kind), and Gatorade.  Patient Instructions  The night before surgery:  No food after midnight. ONLY clear liquids after midnight  The day of surgery (if you do NOT have diabetes):  Drink ONE (1) Pre-Surgery Clear Ensure by 7:30 AM the morning of surgery. Drink in one sitting. Do not sip.  This drink was given to you during your hospital  pre-op appointment visit.  Nothing else to drink after completing the  Pre-Surgery Clear Ensure.         If you have questions, please contact your surgeon's office.     Take these medicines the morning of surgery with A SIP OF WATER:  allopurinol (ZYLOPRIM)  carvedilol (COREG)  gabapentin (NEURONTIN)  levothyroxine (SYNTHROID)  rosuvastatin (CRESTOR)   As of today, STOP taking any Aspirin (unless otherwise instructed by your surgeon) Aleve, Naproxen, Ibuprofen, Motrin, Advil, Goody's, BC's, all herbal medications, fish oil, and all vitamins. This includes your medication: indomethacin (INDOCIN).                     Do NOT Smoke (Tobacco/Vaping) for 24 hours prior to your procedure.  If you use a CPAP at night, you may bring your mask/headgear for your overnight stay.   Contacts, glasses, piercing's, hearing aid's, dentures or partials may not be worn into surgery, please bring cases for these  belongings.    For patients admitted to the hospital, discharge time will be determined by your treatment team.   Patients discharged the day of surgery will not be allowed to drive home, and someone needs to stay with them for 24 hours.  SURGICAL WAITING ROOM VISITATION Patients having surgery or a procedure may have no more than 2 support people in the waiting area - these visitors may rotate.   Children under the age of 33 must have an adult with them who is not the patient. If the patient needs to stay at the hospital during part of their recovery, the visitor guidelines for inpatient rooms apply. Pre-op nurse will coordinate an appropriate time for 1 support person to accompany patient in pre-op.  This support person may not rotate.   Please refer to the [facility] website for the visitor guidelines for Inpatients (after your surgery is over and you are in a regular room).    Special instructions:   [place]- Preparing For Surgery  Before surgery, you can play an important role. Because skin is not sterile, your skin needs to be as free of germs as possible. You can reduce the number of germs on your skin by washing with CHG (chlorahexidine gluconate) Soap before surgery.  CHG is an antiseptic cleaner which kills germs and bonds with the skin to continue killing germs even after washing.    Oral Hygiene is also important to reduce your risk  of infection.  Remember - BRUSH YOUR TEETH THE MORNING OF SURGERY WITH YOUR REGULAR TOOTHPASTE  Please do not use if you have an allergy to CHG or antibacterial soaps. If your skin becomes reddened/irritated stop using the CHG.  Do not shave (including legs and underarms) for at least 48 hours prior to first CHG shower. It is OK to shave your face.  Please follow these instructions carefully.   Shower the NIGHT BEFORE SURGERY and the MORNING OF SURGERY  If you chose to wash your hair, wash your hair first as usual with your normal  shampoo.  After you shampoo, rinse your hair and body thoroughly to remove the shampoo.  Use CHG Soap as you would any other liquid soap. You can apply CHG directly to the skin and wash gently with a scrungie or a clean washcloth.   Apply the CHG Soap to your body ONLY FROM THE NECK DOWN.  Do not use on open wounds or open sores. Avoid contact with your eyes, ears, mouth and genitals (private parts). Wash Face and genitals (private parts)  with your normal soap.   Wash thoroughly, paying special attention to the area where your surgery will be performed.  Thoroughly rinse your body with warm water from the neck down.  DO NOT shower/wash with your normal soap after using and rinsing off the CHG Soap.  Pat yourself dry with a CLEAN TOWEL.  Wear CLEAN PAJAMAS to bed the night before surgery  Place CLEAN SHEETS on your bed the night before your surgery  DO NOT SLEEP WITH PETS.   Day of Surgery: Take a shower with CHG soap. Do not wear jewelry or makeup Do not wear lotions, powders, colognes, or deodorant. Do not shave 48 hours prior to surgery.  Men may shave face and neck. Do not bring valuables to the hospital.  [facility] is not responsible for any belongings or valuables.  Wear Clean/Comfortable clothing the morning of surgery Remember to brush your teeth WITH YOUR REGULAR TOOTHPASTE.   Please read over the following fact sheets that you were given.    If you received a COVID test during your pre-op visit  it is requested that you wear a mask when out in public, stay away from anyone that may not be feeling well and notify your surgeon if you develop symptoms. If you have been in contact with anyone that has tested positive in the last 10 days please notify you surgeon.

## 2022-05-10 ENCOUNTER — Encounter (HOSPITAL_COMMUNITY)
Admission: RE | Admit: 2022-05-10 | Discharge: 2022-05-10 | Disposition: A | Discharge status: Home / Self Care | Payer: Medicare Other | Source: Ambulatory Visit | Attending: Specialist | Admitting: Specialist

## 2022-05-10 ENCOUNTER — Ambulatory Visit (HOSPITAL_COMMUNITY)
Admission: RE | Admit: 2022-05-10 | Discharge: 2022-05-10 | Disposition: A | Discharge status: Home / Self Care | Payer: Medicare Other | Source: Ambulatory Visit | Attending: Orthopedic Surgery | Admitting: Orthopedic Surgery

## 2022-05-10 ENCOUNTER — Other Ambulatory Visit: Payer: Self-pay

## 2022-05-10 ENCOUNTER — Encounter (HOSPITAL_COMMUNITY): Payer: Self-pay

## 2022-05-10 VITALS — BP 159/80 | HR 54 | Temp 97.7°F | Resp 18 | Ht 72.0 in | Wt 235.3 lb

## 2022-05-10 DIAGNOSIS — M48062 Spinal stenosis, lumbar region with neurogenic claudication: Secondary | ICD-10-CM

## 2022-05-10 DIAGNOSIS — Z01818 Encounter for other preprocedural examination: Secondary | ICD-10-CM | POA: Diagnosis present

## 2022-05-10 DIAGNOSIS — R160 Hepatomegaly, not elsewhere classified: Secondary | ICD-10-CM | POA: Diagnosis not present

## 2022-05-10 LAB — SURGICAL PCR SCREEN
MRSA, PCR: NEGATIVE
Staphylococcus aureus: NEGATIVE

## 2022-05-10 NOTE — Progress Notes (Signed)
PCP - Dr. Arlester Marker Cardiologist - denies  PPM/ICD - n/a  Chest x-ray - n/a Lumbar spine XR-05/10/22 EKG - 05/10/22 Stress Test - Requested from [facility] in [place] ECHO - 02/13/21 Cardiac Cath - ?? Requested all cardiac records from [facility].  Sleep Study - denies CPAP - denies  Blood Thinner Instructions: n/a Aspirin Instructions: n/a  ERAS Protcol -Clear liquids until 0730 DOS PRE-SURGERY Ensure or G2- Ensure provided  COVID TEST- n/a  Anesthesia review: Yes, pending cardiac records.    Patient denies shortness of breath, fever, cough and chest pain at PAT appointment   All instructions explained to the patient, with a verbal understanding of the material. Patient agrees to go over the instructions while at home for a better understanding. Patient also instructed to self quarantine after being tested for COVID-19. The opportunity to ask questions was provided.

## 2022-05-11 NOTE — Progress Notes (Signed)
Anesthesia Chart Review:  Patient seen by PCP Dr. Arlester Marker on 04/20/2022 for preop evaluation.  Current medical conditions were noted to be stable.  He was cleared for surgery per note stating, "Overall, Mr. Mayabb is a good candidate for general anesthesia. I will check some follow up labs to make sure he has sustained improvements after his abdominal abscess last year. If these are normal, I will plan to clear him for surgery."  CMP and CBC 04/20/2022 were unremarkable and Dr. Joya Gaskins did subsequently clear the patient.  Patient had cardiology evaluation by Dr. Margaretann Loveless in June 2022 for DOE and fatigue.  Echo 02/13/2021 showed EF 60 to 65%, grade 1 DD, mild AS with mean gradient 13 mmHg.  There is also noted to be an extracardiac echolucent structure and CT was ordered for further evaluation.  CT showed an 8 cm mass lesion arising from segment 2 of the left liver and extending cranially to involve the hemidiaphragms of the midline to generate mass effect on the inferior aspect of the right heart, mainly right ventricle.  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  He subsequently presented to the ED on 03/05/2021 for liver abscess and was admitted for treatment.  He received antibiotics and had drain placed by IR.  He followed up outpatient with IR on 03/19/2021, follow-up CT showed resolution of abscess, drain was removed.  Preop labs reviewed, platelets mildly low at 132k, otherwise unremarkable.  EKG 05/10/22: Sinus bradycardia. Rate 53.  Coronary CT 02/25/21: IMPRESSION: 1. Coronary calcium score of 478. This was 63rd percentile for age, sex, and race matched control.   2. Normal coronary origin with right dominance.   3. Aortic atherosclerosis noted.   4. Aortic valve calcium score of 352 (non-severe).   5. CAD-RADS 2. Mild non-obstructive CAD (25-49%). Consider non-atherosclerotic causes of chest pain. Consider preventive therapy and risk factor  modification.   6. There is a 79 X 62 X 79 mm low density mass adjacent to the inferior portion of the right ventricle. No evidence intracardiac communication. Hounsfield Units of 45 are less consistent with a cystic origin. See radiology report for more detail. Discussed with primary cardiologist.  TTE 02/13/21:  1. Left ventricular ejection fraction, by estimation, is 60 to 65%. The  left ventricle has normal function. The left ventricle has no regional  wall motion abnormalities. Left ventricular diastolic parameters are  consistent with Grade I diastolic  dysfunction (impaired relaxation).   2. Right ventricular systolic function is mildly reduced. The right  ventricular size is normal.   3. 3.5 x 6.5 cm echolucent oval structure adjacent to the posterior wall  -apperas extracardia - seems to compress the RV some. Consider pericardial  cyst, hiatal hernia or other extracardiac masses such as hepatic mass/cyst  - cross-sectional imaging such   as CT may be helpful.   4. The mitral valve is abnormal. Trivial mitral valve regurgitation.   5. The aortic valve is calcified. Aortic valve regurgitation is not  visualized. Mild aortic valve stenosis. Aortic valve area, by VTI measures  1.71 cm. Aortic valve mean gradient measures 13.0 mmHg. Aortic valve Vmax  measures 2.41 m/s. Peak gradient  23.2 mmHg. DI is 0.54.   6. The inferior vena cava is normal in size with greater than 50%  respiratory variability, suggesting right atrial pressure of 3 mmHg.   Conclusion(s)/Recommendation(s): Extracardiac echolucent structure -  recommend additional cross-sectional imaging. Critical findings reported  to Dr.  Margaretann Loveless and acknowledged at 4:40 pm on 02/13/2021.    Wynonia Musty [facility]/Anesthesiology Phone [phone] 05/11/2022 11:10 AM

## 2022-05-11 NOTE — Anesthesia Preprocedure Evaluation (Addendum)
Anesthesia Evaluation  Patient identified by MRN, date of birth, ID band Patient awake    Reviewed: Allergy & Precautions, H&P , NPO status , Patient's Chart, lab work & pertinent test results  Airway Mallampati: II   Neck ROM: full    Dental   Pulmonary neg pulmonary ROS,    breath sounds clear to auscultation       Cardiovascular hypertension,  Rhythm:regular Rate:Normal     Neuro/Psych    GI/Hepatic   Endo/Other  Hypothyroidism   Renal/GU    H/o prostate CA  Penile prosthesis    Musculoskeletal  (+) Arthritis ,   Abdominal   Peds  Hematology   Anesthesia Other Findings   Reproductive/Obstetrics                            Anesthesia Physical Anesthesia Plan  ASA: 2  Anesthesia Plan: General   Post-op Pain Management:    Induction: Intravenous  PONV Risk Score and Plan: 2 and Ondansetron, Dexamethasone, Midazolam and Treatment may vary due to age or medical condition  Airway Management Planned: Oral ETT  Additional Equipment:   Intra-op Plan:   Post-operative Plan: Extubation in OR  Informed Consent: I have reviewed the patients History and Physical, chart, labs and discussed the procedure including the risks, benefits and alternatives for the proposed anesthesia with the patient or authorized representative who has indicated his/her understanding and acceptance.     Dental advisory given  Plan Discussed with: CRNA, Anesthesiologist and Surgeon  Anesthesia Plan Comments: (PAT note by Karoline Caldwell, PA-C: Patient seen by PCP Dr. Arlester Marker on 04/20/2022 for preop evaluation.  Current medical conditions were noted to be stable.  He was cleared for surgery per note stating, "Overall, Mr. Wilbourne is a good candidate for general anesthesia. I will check some follow up labs to make sure he has sustained improvements after his abdominal abscess last year. If these are normal, I  will plan to clear him for surgery."  CMP and CBC 04/20/2022 were unremarkable and Dr. Joya Gaskins did subsequently clear the patient.  Patient had cardiology evaluation by Dr. Margaretann Loveless in June 2022 for DOE and fatigue.  Echo 02/13/2021 showed EF 60 to 65%, grade 1 DD, mild AS with mean gradient 13 mmHg.  There is also noted to be an extracardiac echolucent structure and CT was ordered for further evaluation.  CT showed an 8 cm mass lesion arising from segment 2 of the left liver and extending cranially to involve the hemidiaphragms of the midline to generate mass effect on the inferior aspect of the right heart, mainly right ventricle.  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  He subsequently presented to the ED on 03/05/2021 for liver abscess and was admitted for treatment.  He received antibiotics and had drain placed by IR.  He followed up outpatient with IR on 03/19/2021, follow-up CT showed resolution of abscess, drain was removed.  Preop labs reviewed, platelets mildly low at 132k, otherwise unremarkable.  EKG 05/10/22: Sinus bradycardia. Rate 53.  Coronary CT 02/25/21: IMPRESSION: 1. Coronary calcium score of 478. This was 63rd percentile for age, sex, and race matched control.  2. Normal coronary origin with right dominance.  3. Aortic atherosclerosis noted.  4. Aortic valve calcium score of 352 (non-severe).  5. CAD-RADS 2. Mild non-obstructive CAD (25-49%). Consider non-atherosclerotic causes of chest pain. Consider preventive therapy and risk factor modification.  6. There is a 79 X 62 X 79 mm low density mass adjacent to the inferior portion of the right ventricle. No evidence intracardiac communication. Hounsfield Units of 45 are less consistent with a cystic origin. See radiology report for more detail. Discussed with primary cardiologist.  TTE 02/13/21: 1. Left ventricular ejection fraction, by estimation, is 60 to 65%. The  left  ventricle has normal function. The left ventricle has no regional  wall motion abnormalities. Left ventricular diastolic parameters are  consistent with Grade I diastolic  dysfunction (impaired relaxation).  2. Right ventricular systolic function is mildly reduced. The right  ventricular size is normal.  3. 3.5 x 6.5 cm echolucent oval structure adjacent to the posterior wall  -apperas extracardia - seems to compress the RV some. Consider pericardial  cyst, hiatal hernia or other extracardiac masses such as hepatic mass/cyst  - cross-sectional imaging such  as CT may be helpful.  4. The mitral valve is abnormal. Trivial mitral valve regurgitation.  5. The aortic valve is calcified. Aortic valve regurgitation is not  visualized. Mild aortic valve stenosis. Aortic valve area, by VTI measures  1.71 cm. Aortic valve mean gradient measures 13.0 mmHg. Aortic valve Vmax  measures 2.41 m/s. Peak gradient  23.2 mmHg. DI is 0.54.  6. The inferior vena cava is normal in size with greater than 50%  respiratory variability, suggesting right atrial pressure of 3 mmHg.   Conclusion(s)/Recommendation(s): Extracardiac echolucent structure -  recommend additional cross-sectional imaging. Critical findings reported  to Dr. Margaretann Loveless and acknowledged at 4:40 pm on 02/13/2021.   )       Anesthesia Quick Evaluation

## 2022-05-13 ENCOUNTER — Ambulatory Visit (HOSPITAL_COMMUNITY): Payer: Medicare Other

## 2022-05-13 ENCOUNTER — Ambulatory Visit (HOSPITAL_COMMUNITY): Payer: Medicare Other | Admitting: Physician Assistant

## 2022-05-13 ENCOUNTER — Ambulatory Visit (HOSPITAL_COMMUNITY)
Admission: RE | Admit: 2022-05-13 | Discharge: 2022-05-13 | Disposition: A | Discharge status: Home / Self Care | Payer: Medicare Other | Attending: Specialist | Admitting: Specialist

## 2022-05-13 ENCOUNTER — Ambulatory Visit (HOSPITAL_BASED_OUTPATIENT_CLINIC_OR_DEPARTMENT_OTHER): Payer: Medicare Other | Admitting: Physician Assistant

## 2022-05-13 ENCOUNTER — Encounter (HOSPITAL_COMMUNITY)
Admission: RE | Disposition: A | Discharge status: Home / Self Care | Payer: Self-pay | Source: Home / Self Care | Attending: Specialist

## 2022-05-13 ENCOUNTER — Other Ambulatory Visit: Payer: Self-pay

## 2022-05-13 DIAGNOSIS — M48062 Spinal stenosis, lumbar region with neurogenic claudication: Secondary | ICD-10-CM | POA: Diagnosis not present

## 2022-05-13 DIAGNOSIS — M48061 Spinal stenosis, lumbar region without neurogenic claudication: Secondary | ICD-10-CM | POA: Diagnosis present

## 2022-05-13 DIAGNOSIS — M545 Low back pain, unspecified: Secondary | ICD-10-CM

## 2022-05-13 HISTORY — PX: LUMBAR LAMINECTOMY/DECOMPRESSION MICRODISCECTOMY: SHX5026

## 2022-05-13 SURGERY — LUMBAR LAMINECTOMY/DECOMPRESSION MICRODISCECTOMY 1 LEVEL
Anesthesia: General

## 2022-05-13 MED ORDER — DOCUSATE SODIUM 100 MG PO CAPS
100.0000 mg | ORAL_CAPSULE | Freq: Two times a day (BID) | ORAL | Status: DC
  Administered 2022-05-13: 100 mg via ORAL
  Filled 2022-05-13: qty 1

## 2022-05-13 MED ORDER — LIDOCAINE 2% (20 MG/ML) 5 ML SYRINGE
INTRAMUSCULAR | Status: DC | PRN
  Administered 2022-05-13: 60 mg via INTRAVENOUS

## 2022-05-13 MED ORDER — SUGAMMADEX SODIUM 200 MG/2ML IV SOLN
INTRAVENOUS | Status: DC | PRN
  Administered 2022-05-13: 200 mg via INTRAVENOUS

## 2022-05-13 MED ORDER — COLCHICINE 0.6 MG PO TABS
0.6000 mg | ORAL_TABLET | Freq: Every day | ORAL | Status: DC
  Filled 2022-05-13: qty 1

## 2022-05-13 MED ORDER — PHENYLEPHRINE HCL-NACL 20-0.9 MG/250ML-% IV SOLN
INTRAVENOUS | Status: DC | PRN
  Administered 2022-05-13: 25 ug/min via INTRAVENOUS

## 2022-05-13 MED ORDER — LACTATED RINGERS IV SOLN: INTRAVENOUS | Status: DC

## 2022-05-13 MED ORDER — ALLOPURINOL 100 MG PO TABS: 100.0000 mg | ORAL_TABLET | Freq: Every day | ORAL | Status: DC

## 2022-05-13 MED ORDER — METHOCARBAMOL 500 MG PO TABS: 500.0000 mg | ORAL_TABLET | Freq: Four times a day (QID) | ORAL | Status: DC | PRN

## 2022-05-13 MED ORDER — OXYCODONE HCL 5 MG/5ML PO SOLN: 5.0000 mg | Freq: Once | ORAL | Status: DC | PRN

## 2022-05-13 MED ORDER — FENTANYL CITRATE (PF) 100 MCG/2ML IJ SOLN: 25.0000 ug | INTRAMUSCULAR | Status: DC | PRN

## 2022-05-13 MED ORDER — COQ10 100 MG PO CAPS: ORAL_CAPSULE | Freq: Every day | ORAL | Status: DC

## 2022-05-13 MED ORDER — BISACODYL 5 MG PO TBEC: 5.0000 mg | DELAYED_RELEASE_TABLET | Freq: Every day | ORAL | Status: DC | PRN

## 2022-05-13 MED ORDER — FENTANYL CITRATE (PF) 250 MCG/5ML IJ SOLN
INTRAMUSCULAR | Status: AC
  Filled 2022-05-13: qty 5

## 2022-05-13 MED ORDER — TRANEXAMIC ACID-NACL 1000-0.7 MG/100ML-% IV SOLN
1000.0000 mg | INTRAVENOUS | Status: AC
  Administered 2022-05-13: 1000 mg via INTRAVENOUS
  Filled 2022-05-13: qty 100

## 2022-05-13 MED ORDER — FENTANYL CITRATE (PF) 250 MCG/5ML IJ SOLN
INTRAMUSCULAR | Status: DC | PRN
  Administered 2022-05-13 (×4): 50 ug via INTRAVENOUS

## 2022-05-13 MED ORDER — ONDANSETRON HCL 4 MG/2ML IJ SOLN: 4.0000 mg | Freq: Four times a day (QID) | INTRAMUSCULAR | Status: DC | PRN

## 2022-05-13 MED ORDER — CEFAZOLIN SODIUM-DEXTROSE 2-4 GM/100ML-% IV SOLN: 2.0000 g | Freq: Three times a day (TID) | INTRAVENOUS | Status: DC

## 2022-05-13 MED ORDER — BUPIVACAINE-EPINEPHRINE 0.5% -1:200000 IJ SOLN
INTRAMUSCULAR | Status: DC | PRN
  Administered 2022-05-13: 3 mL

## 2022-05-13 MED ORDER — CARVEDILOL 25 MG PO TABS: 25.0000 mg | ORAL_TABLET | Freq: Two times a day (BID) | ORAL | Status: DC

## 2022-05-13 MED ORDER — GABAPENTIN 300 MG PO CAPS: 300.0000 mg | ORAL_CAPSULE | Freq: Two times a day (BID) | ORAL | Status: DC

## 2022-05-13 MED ORDER — CEFAZOLIN SODIUM-DEXTROSE 2-4 GM/100ML-% IV SOLN
2.0000 g | INTRAVENOUS | Status: AC
  Administered 2022-05-13: 2 g via INTRAVENOUS
  Filled 2022-05-13: qty 100

## 2022-05-13 MED ORDER — ALUM & MAG HYDROXIDE-SIMETH 200-200-20 MG/5ML PO SUSP: 30.0000 mL | Freq: Four times a day (QID) | ORAL | Status: DC | PRN

## 2022-05-13 MED ORDER — ONDANSETRON HCL 4 MG/2ML IJ SOLN
INTRAMUSCULAR | Status: AC
  Filled 2022-05-13: qty 2

## 2022-05-13 MED ORDER — ONDANSETRON HCL 4 MG/2ML IJ SOLN
INTRAMUSCULAR | Status: DC | PRN
  Administered 2022-05-13: 4 mg via INTRAVENOUS

## 2022-05-13 MED ORDER — DOCUSATE SODIUM 100 MG PO CAPS: 100.0000 mg | ORAL_CAPSULE | Freq: Two times a day (BID) | ORAL | 1 refills | Status: DC | PRN

## 2022-05-13 MED ORDER — DEXAMETHASONE SODIUM PHOSPHATE 10 MG/ML IJ SOLN
INTRAMUSCULAR | Status: AC
  Filled 2022-05-13: qty 1

## 2022-05-13 MED ORDER — OXYCODONE HCL 5 MG PO TABS: 5.0000 mg | ORAL_TABLET | ORAL | Status: DC | PRN

## 2022-05-13 MED ORDER — BUPIVACAINE-EPINEPHRINE (PF) 0.5% -1:200000 IJ SOLN
INTRAMUSCULAR | Status: AC
  Filled 2022-05-13: qty 30

## 2022-05-13 MED ORDER — ACETAMINOPHEN 650 MG RE SUPP: 650.0000 mg | RECTAL | Status: DC | PRN

## 2022-05-13 MED ORDER — LEVOTHYROXINE SODIUM 88 MCG PO TABS
88.0000 ug | ORAL_TABLET | Freq: Every day | ORAL | Status: DC
  Filled 2022-05-13: qty 1

## 2022-05-13 MED ORDER — OXYCODONE HCL 5 MG PO TABS: 5.0000 mg | ORAL_TABLET | ORAL | 0 refills | Status: DC | PRN

## 2022-05-13 MED ORDER — RISAQUAD PO CAPS
1.0000 | ORAL_CAPSULE | Freq: Every day | ORAL | Status: DC
  Administered 2022-05-13: 1 via ORAL
  Filled 2022-05-13: qty 1

## 2022-05-13 MED ORDER — DEXAMETHASONE SODIUM PHOSPHATE 10 MG/ML IJ SOLN
INTRAMUSCULAR | Status: DC | PRN
  Administered 2022-05-13: 10 mg via INTRAVENOUS

## 2022-05-13 MED ORDER — METHOCARBAMOL 500 MG PO TABS: 500.0000 mg | ORAL_TABLET | Freq: Three times a day (TID) | ORAL | 0 refills | Status: DC | PRN

## 2022-05-13 MED ORDER — ORAL CARE MOUTH RINSE: 15.0000 mL | Freq: Once | OROMUCOSAL | Status: AC

## 2022-05-13 MED ORDER — 0.9 % SODIUM CHLORIDE (POUR BTL) OPTIME
TOPICAL | Status: DC | PRN
  Administered 2022-05-13: 1000 mL

## 2022-05-13 MED ORDER — ROCURONIUM BROMIDE 10 MG/ML (PF) SYRINGE
PREFILLED_SYRINGE | INTRAVENOUS | Status: AC
  Filled 2022-05-13: qty 10

## 2022-05-13 MED ORDER — PROPOFOL 10 MG/ML IV BOLUS
INTRAVENOUS | Status: AC
  Filled 2022-05-13: qty 20

## 2022-05-13 MED ORDER — EPHEDRINE SULFATE-NACL 50-0.9 MG/10ML-% IV SOSY
PREFILLED_SYRINGE | INTRAVENOUS | Status: DC | PRN
  Administered 2022-05-13: 10 mg via INTRAVENOUS

## 2022-05-13 MED ORDER — ACETAMINOPHEN 10 MG/ML IV SOLN
1000.0000 mg | INTRAVENOUS | Status: AC
  Administered 2022-05-13: 1000 mg via INTRAVENOUS
  Filled 2022-05-13: qty 100

## 2022-05-13 MED ORDER — OXYCODONE HCL 5 MG PO TABS: 5.0000 mg | ORAL_TABLET | Freq: Once | ORAL | Status: DC | PRN

## 2022-05-13 MED ORDER — METHOCARBAMOL 1000 MG/10ML IJ SOLN: 500.0000 mg | Freq: Four times a day (QID) | INTRAVENOUS | Status: DC | PRN

## 2022-05-13 MED ORDER — ROCURONIUM BROMIDE 10 MG/ML (PF) SYRINGE
PREFILLED_SYRINGE | INTRAVENOUS | Status: DC | PRN
  Administered 2022-05-13: 20 mg via INTRAVENOUS
  Administered 2022-05-13: 60 mg via INTRAVENOUS

## 2022-05-13 MED ORDER — ACETAMINOPHEN 325 MG PO TABS: 650.0000 mg | ORAL_TABLET | ORAL | Status: DC | PRN

## 2022-05-13 MED ORDER — THROMBIN 20000 UNITS EX SOLR
CUTANEOUS | Status: DC | PRN
  Administered 2022-05-13: 20 mL via TOPICAL

## 2022-05-13 MED ORDER — SUCCINYLCHOLINE CHLORIDE 200 MG/10ML IV SOSY
PREFILLED_SYRINGE | INTRAVENOUS | Status: AC
  Filled 2022-05-13: qty 10

## 2022-05-13 MED ORDER — FOLIC ACID 1 MG PO TABS
1.0000 mg | ORAL_TABLET | Freq: Every day | ORAL | Status: DC
  Administered 2022-05-13: 1 mg via ORAL
  Filled 2022-05-13: qty 1

## 2022-05-13 MED ORDER — POLYETHYLENE GLYCOL 3350 17 G PO PACK: 17.0000 g | PACK | Freq: Every day | ORAL | Status: DC | PRN

## 2022-05-13 MED ORDER — CHLORHEXIDINE GLUCONATE 0.12 % MT SOLN
15.0000 mL | Freq: Once | OROMUCOSAL | Status: AC
  Administered 2022-05-13: 15 mL via OROMUCOSAL
  Filled 2022-05-13: qty 15

## 2022-05-13 MED ORDER — THROMBIN 20000 UNITS EX SOLR
CUTANEOUS | Status: AC
  Filled 2022-05-13: qty 20000

## 2022-05-13 MED ORDER — PHENOL 1.4 % MT LIQD: 1.0000 | OROMUCOSAL | Status: DC | PRN

## 2022-05-13 MED ORDER — POTASSIUM CHLORIDE IN NACL 20-0.45 MEQ/L-% IV SOLN
INTRAVENOUS | Status: DC
  Filled 2022-05-13: qty 1000

## 2022-05-13 MED ORDER — PHENYLEPHRINE 80 MCG/ML (10ML) SYRINGE FOR IV PUSH (FOR BLOOD PRESSURE SUPPORT)
PREFILLED_SYRINGE | INTRAVENOUS | Status: DC | PRN
  Administered 2022-05-13: 160 ug via INTRAVENOUS

## 2022-05-13 MED ORDER — ONDANSETRON HCL 4 MG PO TABS: 4.0000 mg | ORAL_TABLET | Freq: Four times a day (QID) | ORAL | Status: DC | PRN

## 2022-05-13 MED ORDER — KCL IN DEXTROSE-NACL 20-5-0.45 MEQ/L-%-% IV SOLN: INTRAVENOUS | Status: DC

## 2022-05-13 MED ORDER — MENTHOL 3 MG MT LOZG: 1.0000 | LOZENGE | OROMUCOSAL | Status: DC | PRN

## 2022-05-13 MED ORDER — PROPOFOL 10 MG/ML IV BOLUS
INTRAVENOUS | Status: DC | PRN
  Administered 2022-05-13: 180 mg via INTRAVENOUS

## 2022-05-13 MED ORDER — HYDROMORPHONE HCL 1 MG/ML IJ SOLN: 0.5000 mg | INTRAMUSCULAR | Status: DC | PRN

## 2022-05-13 MED ORDER — LIDOCAINE 2% (20 MG/ML) 5 ML SYRINGE
INTRAMUSCULAR | Status: AC
  Filled 2022-05-13: qty 5

## 2022-05-13 MED ORDER — MAGNESIUM CITRATE PO SOLN: 1.0000 | Freq: Once | ORAL | Status: DC | PRN

## 2022-05-13 MED ORDER — POLYETHYLENE GLYCOL 3350 17 G PO PACK: 17.0000 g | PACK | Freq: Every day | ORAL | 0 refills | Status: DC

## 2022-05-13 SURGICAL SUPPLY — 63 items
BAG COUNTER SPONGE SURGICOUNT (BAG) ×1 IMPLANT
BAG DECANTER FOR FLEXI CONT (MISCELLANEOUS) IMPLANT
BAND RUBBER #18 3X1/16 STRL (MISCELLANEOUS) ×2 IMPLANT
BUR EGG ELITE 5.0 (BURR) IMPLANT
BUR RND DIAMOND ELITE 4.0 (BURR) IMPLANT
BUR STRYKR EGG 5.0 (BURR) IMPLANT
CARTRIDGE OIL MAESTRO DRILL (MISCELLANEOUS) IMPLANT
CLEANER TIP ELECTROSURG 2X2 (MISCELLANEOUS) ×1 IMPLANT
CNTNR URN SCR LID CUP LEK RST (MISCELLANEOUS) ×1 IMPLANT
CONT SPEC 4OZ STRL OR WHT (MISCELLANEOUS) ×1
DIFFUSER DRILL AIR PNEUMATIC (MISCELLANEOUS) IMPLANT
DRAPE LAPAROTOMY 100X72X124 (DRAPES) ×1 IMPLANT
DRAPE MICROSCOPE SLANT 54X150 (MISCELLANEOUS) ×1 IMPLANT
DRAPE SHEET LG 3/4 BI-LAMINATE (DRAPES) ×1 IMPLANT
DRAPE SURG 17X11 SM STRL (DRAPES) ×1 IMPLANT
DRAPE UTILITY XL STRL (DRAPES) ×1 IMPLANT
DRSG AQUACEL AG ADV 3.5X 4 (GAUZE/BANDAGES/DRESSINGS) IMPLANT
DRSG AQUACEL AG ADV 3.5X 6 (GAUZE/BANDAGES/DRESSINGS) IMPLANT
DRSG TELFA 3X8 NADH (GAUZE/BANDAGES/DRESSINGS) IMPLANT
DURAPREP 26ML APPLICATOR (WOUND CARE) ×1 IMPLANT
DURASEAL SPINE SEALANT 3ML (MISCELLANEOUS) IMPLANT
ELECT BLADE 4.0 EZ CLEAN MEGAD (MISCELLANEOUS)
ELECT REM PT RETURN 9FT ADLT (ELECTROSURGICAL) ×1
ELECTRODE BLDE 4.0 EZ CLN MEGD (MISCELLANEOUS) IMPLANT
ELECTRODE REM PT RTRN 9FT ADLT (ELECTROSURGICAL) ×1 IMPLANT
GLOVE BIOGEL PI IND STRL 7.5 (GLOVE) ×1 IMPLANT
GLOVE SURG SS PI 7.0 STRL IVOR (GLOVE) ×1 IMPLANT
GLOVE SURG SS PI 8.0 STRL IVOR (GLOVE) ×2 IMPLANT
GOWN STRL REUS W/ TWL LRG LVL3 (GOWN DISPOSABLE) ×1 IMPLANT
GOWN STRL REUS W/ TWL XL LVL3 (GOWN DISPOSABLE) ×1 IMPLANT
GOWN STRL REUS W/TWL LRG LVL3 (GOWN DISPOSABLE) ×2
GOWN STRL REUS W/TWL XL LVL3 (GOWN DISPOSABLE) ×1
IV CATH 14GX2 1/4 (CATHETERS) ×1 IMPLANT
KIT BASIN OR (CUSTOM PROCEDURE TRAY) ×1 IMPLANT
NDL 22X1.5 STRL (OR ONLY) (MISCELLANEOUS) ×1 IMPLANT
NDL SPNL 18GX3.5 QUINCKE PK (NEEDLE) ×2 IMPLANT
NEEDLE 22X1.5 STRL (OR ONLY) (MISCELLANEOUS) ×1 IMPLANT
NEEDLE SPNL 18GX3.5 QUINCKE PK (NEEDLE) ×2 IMPLANT
OIL CARTRIDGE MAESTRO DRILL (MISCELLANEOUS)
PACK LAMINECTOMY NEURO (CUSTOM PROCEDURE TRAY) ×1 IMPLANT
PAD DRESSING TELFA 3X8 NADH (GAUZE/BANDAGES/DRESSINGS) IMPLANT
PATTIES SURGICAL .75X.75 (GAUZE/BANDAGES/DRESSINGS) ×1 IMPLANT
SOLUTION PRONTOSAN WOUND 350ML (IRRIGATION / IRRIGATOR) IMPLANT
SPONGE SURGIFOAM ABS GEL 100 (HEMOSTASIS) ×1 IMPLANT
SPONGE T-LAP 4X18 ~~LOC~~+RFID (SPONGE) IMPLANT
STAPLER VISISTAT (STAPLE) IMPLANT
STRIP CLOSURE SKIN 1/2X4 (GAUZE/BANDAGES/DRESSINGS) ×1 IMPLANT
SUT NURALON 4 0 TR CR/8 (SUTURE) IMPLANT
SUT PROLENE 3 0 PS 2 (SUTURE) IMPLANT
SUT VIC AB 1 CT1 27 (SUTURE)
SUT VIC AB 1 CT1 27XBRD ANTBC (SUTURE) IMPLANT
SUT VIC AB 1 CTB1 27 (SUTURE) IMPLANT
SUT VIC AB 1-0 CT2 27 (SUTURE) IMPLANT
SUT VIC AB 2-0 CT1 27 (SUTURE)
SUT VIC AB 2-0 CT1 TAPERPNT 27 (SUTURE) IMPLANT
SUT VIC AB 2-0 CT2 27 (SUTURE) IMPLANT
SYR 3ML LL SCALE MARK (SYRINGE) ×1 IMPLANT
TOWEL GREEN STERILE (TOWEL DISPOSABLE) ×1 IMPLANT
TOWEL GREEN STERILE FF (TOWEL DISPOSABLE) ×1 IMPLANT
TRAY FOLEY MTR SLVR 14FR STAT (SET/KITS/TRAYS/PACK) IMPLANT
TRAY FOLEY MTR SLVR 16FR STAT (SET/KITS/TRAYS/PACK) ×1 IMPLANT
WIPE CHG 2% PREP (PERSONAL CARE ITEMS) ×1 IMPLANT
YANKAUER SUCT BULB TIP NO VENT (SUCTIONS) ×1 IMPLANT

## 2022-05-13 NOTE — Op Note (Unsigned)
[name], [name] MEDICAL RECORD NO: [id number] ACCOUNT NO: [id number] DATE OF BIRTH: [birth year] FACILITY: [facility] LOCATION: [facility] PHYSICIAN: Johnn Hai, MD  Operative Report   DATE OF PROCEDURE: 05/13/2022  PREOPERATIVE DIAGNOSIS:  Spinal stenosis, L3-L4 and L4-L5.  POSTOPERATIVE DIAGNOSES:  Spinal stenosis, L3-L4 and L4-L5.  PROCEDURE PERFORMED:  Central laminectomy of L3-L4 and L4-L5 with bilateral hemilaminotomies and foraminotomies of L3, L4 and L5.  ANESTHESIA:  General.  ASSISTANT:  Lacie Draft, PA.  HISTORY:  A 76 year old male with neurogenic claudication secondary to severe spinal stenosis, L3-L4 and lateral recess at L4-L5.  Indicated for decompression at those levels failing conservative treatment.  Risks and benefits discussed including  bleeding, infection, damage to neurovascular structures, no change in symptoms, worsening symptoms, DVT, PE, anesthetic complications, etc.  TECHNIQUE:  With the patient in supine position.  After induction of adequate general anesthesia, 2 grams Kefzol placed prone on the Wilson frame.  All bony prominences were well padded.  Foley to gravity.  Lumbar region was prepped and draped in the  usual sterile fashion.  Two 18-gauge spinal needles utilized to localize the L3-L4, L4-L5 interspace, confirmed with x-ray.  Incision was made from the spinous process 3 to spinous process 5.  Subcutaneous tissue was dissected.  Electrocautery was  utilized to achieve hemostasis.  Dorsal lumbar fascia was divided in line with skin incision.  Paraspinous muscle elevated from lamina L3-L4 and L4-L5.  McCulloch retractor was placed.  Operating microscope was draped and brought in the surgical field.   Confirmatory radiograph obtained.  Leksell rongeur was utilized to remove the spinous processes of 4 partially of 3 and of 5.  Attention was turned towards L3-L4 first.  A straight curette was utilized to skeletonize the interlaminar window at L3-L4.  I   used a high-speed bur to remove a portion of the hemilamina of 3 bilaterally.  The patient had a large hypertrophic facet on the right at L3-L4.  I removed a less than 50% of the medial aspect of the inferior process of 3 with a 3 mm Kerrison.  Following  this, severe ligamentum flavum hypertrophy was noted bilaterally.  I entered the epidural space centrally after I utilized a 2 mm Kerrison to enlarge a hemilaminotomy of L3 bilaterally to the point cephalad of detaching the ligamentum flavum.   Ligamentum flavum then was sequentially removed from the interface first centrally and developing a plane between the epidural tissue between the dura and the ligamentum flavum with a Woodson retractor.  Attention was first turned to the left side.   Ligamentum flavum removed from the left side and decompressed the lateral recess to medial border of the pedicle.  Severe hypertrophic ligamentum flavum was noted.  On the right, I removed ligamentum flavum and a portion of the superior articulating  process of L4 with a 2 mm Kerrison and a curette.  Severe compression of the lateral aspect of the thecal sac was noted here.  I performed foraminotomies of L2 and L3.  Both were stenotic.  Hemilaminotomies of the cephalad edge of 4 were performed with a  2 and a 3 mm Kerrison as well, detached the ligamentum flavum, removing it from the interspace.  Following the decompression, the [facility] probe passed freely out the foramen at 3 and 4 bilaterally.  Below the pedicle 4 and above the pedicle of 3 as well  as centrally.  Good restoration of the thecal sac.  Bone wax was placed on the cancellous surfaces.  A patty  was placed over the laminotomy defect.  Attention was turned towards L4-L5 in similar fashion. Hemilaminotomies of the caudad edge of 4 were  performed with a 3 mm Kerrison to the point of cephalad detaching the ligamentum flavum.  Ligamentum flavum detached from the cephalad edge of 5 utilizing a straight curette.   Ligamentum flavum removed from the interspace first centrally developing a  plane between the dura and the ligamentum flavum.  Severe lateral recess stenosis was noted bilaterally.  I decompressed the lateral recess and medial border of the pedicle, utilizing a 2 mm Kerrison.  We performed foraminotomies of 5.  Bipolar cautery  was utilized to achieve hemostasis.  Bone wax was placed on the cancellous surfaces.  Good restoration of the thecal sac was noted and a Woodson probe passed freely out the foramen of 4 and 5 bilaterally following the decompression.  No disk herniation  was noted at L3-L4 or L4-L5 bilaterally.  Copiously irrigated with irrigation.  Woodson retractors were placed in the foramen of L3 and L5 and a confirmatory radiograph obtained.  I felt there was excellent decompression.  I placed bone wax cancellous  surfaces copiously irrigated with antibiotic irrigation.  We removed the [facility] retractor, irrigated the paraspinous musculature copiously.  Bipolar cautery was utilized to achieve hemostasis.  No evidence of active bleeding or CSF leakage.   Therefore, closed the dorsal lumbar fascia with #1 Vicryl in interrupted figure-of-eight sutures, subcutaneous with 2-0 and skin with staples.  Wound was dressed sterilely, placed supine on the hospital bed, extubated without difficulty and transported  to the recovery room in satisfactory condition.  The patient tolerated the procedure well.  No complications.  ASSISTANTLacie Draft, PA.  BLOOD LOSS:  150 mL   PUS D: 05/13/2022 1:02:25 pm T: 05/13/2022 1:41:00 pm  JOB: [id number]/ [id number]

## 2022-05-13 NOTE — Discharge Instructions (Signed)

## 2022-05-13 NOTE — Transfer of Care (Signed)
Immediate Anesthesia Transfer of Care Note  Patient: [name]  Procedure(s) Performed: Microlumbar laminectomy Lumbar three-four , Lumbar four-five  Patient Location: PACU  Anesthesia Type:General  Level of Consciousness: awake, alert  and oriented  Airway & Oxygen Therapy: Patient connected to face mask oxygen  Post-op Assessment: Post -op Vital signs reviewed and stable  Post vital signs: stable  Last Vitals:  Vitals Value Taken Time  BP    Temp    Pulse 50 05/13/22 1309  Resp    SpO2 98 % 05/13/22 1309  Vitals shown include unvalidated device data.  Last Pain:  Vitals:   05/13/22 0901  PainSc: 0-No pain         Complications: No notable events documented.

## 2022-05-13 NOTE — Evaluation (Signed)
Occupational Therapy Evaluation and Discharge Summary Patient Details Name: [name] MRN: [id number] DOB: 1946/07/26 Today's Date: 05/13/2022   History of Present Illness Pt is a 76 yo male admitted with spinal stenosis and underwent a L3-4, L4-5 microlumbar laminectomy.   Clinical Impression   Pt admitted with the above diagnosis and overall is doing very well with adls. Pt able to complete all basic adls in room without physical assist and following precautions. Pt given handouts for back safety and educated in log rolling. Pt has wife that will be with him at all times. Pt safe from therapy perspective to d/c home with wife.         Recommendations for follow up therapy are one component of a multi-disciplinary discharge planning process, led by the attending physician.  Recommendations may be updated based on patient status, additional functional criteria and insurance authorization.   Follow Up Recommendations  No OT follow up    Assistance Recommended at Discharge Intermittent Supervision/Assistance  Patient can return home with the following Help with stairs or ramp for entrance;Assist for transportation    Functional Status Assessment  Patient has not had a recent decline in their functional status  Equipment Recommendations  None recommended by OT    Recommendations for Other Services       Precautions / Restrictions Precautions Precautions: Back Precaution Booklet Issued: Yes (comment) Restrictions Weight Bearing Restrictions: No      Mobility Bed Mobility Overal bed mobility: Modified Independent             General bed mobility comments: PT followed all precautions. Pt did use bedrail to come into full sitting position.    Transfers Overall transfer level: Modified independent Equipment used: None               General transfer comment: Pt was steady getting onto feet. Supervision provided only because IV was present.      Balance Overall  balance assessment: Mild deficits observed, not formally tested                                         ADL either performed or assessed with clinical judgement   ADL Overall ADL's : At baseline                                       General ADL Comments: Pt is doing better adls now then before surgery.  Pt with less pain and is able to do most basic adls for himself at this time. Wife there to assist if needed.     Vision Baseline Vision/History: 1 Wears glasses Ability to See in Adequate Light: 0 Adequate Patient Visual Report: No change from baseline Vision Assessment?: No apparent visual deficits     Perception     Praxis      Pertinent Vitals/Pain Pain Assessment Pain Assessment: 0-10 Pain Score: 5  Pain Location: incision Pain Descriptors / Indicators: Burning Pain Intervention(s): Monitored during session, Repositioned     Hand Dominance Right   Extremity/Trunk Assessment Upper Extremity Assessment Upper Extremity Assessment: Overall WFL for tasks assessed   Lower Extremity Assessment Lower Extremity Assessment: Overall WFL for tasks assessed   Cervical / Trunk Assessment Cervical / Trunk Assessment: Back Surgery   Communication Communication Communication: No difficulties   Cognition  Arousal/Alertness: Awake/alert Behavior During Therapy: WFL for tasks assessed/performed Overall Cognitive Status: Within Functional Limits for tasks assessed                                       General Comments  Pt doing very well post surgery. Pt stated he feels slightly groggy from anasthesia but had not overt LOB and just moved slowly but with no physical assist.    Exercises     Shoulder Instructions      Home Living Family/patient expects to be discharged to:: Private residence Living Arrangements: Spouse/significant other                                      Prior Functioning/Environment  Prior Level of Function : Independent/Modified Independent             Mobility Comments: has used walker in past when back was worse but has not used lately ADLs Comments: Pt completes adls on own; occasionally sits in shower on chair.        OT Problem List:        OT Treatment/Interventions:      OT Goals(Current goals can be found in the care plan section) Acute Rehab OT Goals Patient Stated Goal: to go home OT Goal Formulation: All assessment and education complete, DC therapy  OT Frequency:      Co-evaluation              AM-PAC OT "6 Clicks" Daily Activity     Outcome Measure Help from another person eating meals?: None Help from another person taking care of personal grooming?: None Help from another person toileting, which includes using toliet, bedpan, or urinal?: None Help from another person bathing (including washing, rinsing, drying)?: None Help from another person to put on and taking off regular upper body clothing?: None Help from another person to put on and taking off regular lower body clothing?: None 6 Click Score: 24   End of Session Nurse Communication: Mobility status  Activity Tolerance: Patient tolerated treatment well Patient left: in chair;with call bell/phone within reach;with family/visitor present  OT Visit Diagnosis: Unsteadiness on feet (R26.81)                Time: 1027-2536 OT Time Calculation (min): 28 min Charges:  OT General Charges $OT Visit: 1 Visit OT Evaluation $OT Eval Low Complexity: 1 Low OT Treatments $Self Care/Home Management : 8-22 mins  Glenford Peers 05/13/2022, 4:24 PM

## 2022-05-13 NOTE — Progress Notes (Signed)
PT Cancellation Note  Patient Details Name: [name] MRN: [id number] DOB: September 01, 1945   Cancelled Treatment:    Reason Eval/Treat Not Completed: PT screened, no needs identified, will sign off Per OT, no skilled PT needs. If needs change, please re-consult.   [name], DPT  Acute Rehabilitation Services  Office: [phone]    [name] 05/13/2022, 4:18 PM

## 2022-05-13 NOTE — Interval H&P Note (Signed)
History and Physical Interval Note:  05/13/2022 9:34 AM  [name]  has presented today for surgery, with the diagnosis of Spinal stenosis L3-4.  The various methods of treatment have been discussed with the patient and family. After consideration of risks, benefits and other options for treatment, the patient has consented to  Procedure(s) with comments: Microlumbar laminectomy L3-4, possible L4-5 (N/A) - 2 hrs 3 C-Bed as a surgical intervention.  The patient's history has been reviewed, patient examined, no change in status, stable for surgery.  I have reviewed the patient's chart and labs.  Questions were answered to the patient's satisfaction.     Johnn Hai

## 2022-05-13 NOTE — Brief Op Note (Signed)
05/13/2022  9:35 AM  PATIENT:  [name]  76 y.o. male  PRE-OPERATIVE DIAGNOSIS:  Spinal stenosis L3-4  POST-OPERATIVE DIAGNOSIS:  * No post-op diagnosis entered *  PROCEDURE:  Procedure(s) with comments: Microlumbar laminectomy L3-4, possible L4-5 (N/A) - 2 hrs 3 C-Bed  SURGEON:  Surgeon(s) and Role:    Susa Day, MD - Primary  PHYSICIAN ASSISTANT:   ASSISTANTS: Bissell   ANESTHESIA:   general  EBL:  150  BLOOD ADMINISTERED:none  DRAINS: none   LOCAL MEDICATIONS USED:  MARCAINE     SPECIMEN:  No Specimen  DISPOSITION OF SPECIMEN:  N/A  COUNTS:  YES  TOURNIQUET:  * No tourniquets in log *  DICTATION: .Other Dictation: Dictation Number [id number]  PLAN OF CARE: Admit for overnight observation  PATIENT DISPOSITION:  PACU - hemodynamically stable.   Delay start of Pharmacological VTE agent (>24hrs) due to surgical blood loss or risk of bleeding: yes

## 2022-05-13 NOTE — Anesthesia Procedure Notes (Signed)
Procedure Name: Intubation Date/Time: 05/13/2022 10:20 AM  Performed by: Lavell Luster, CRNAPre-anesthesia Checklist: Patient identified, Emergency Drugs available, Suction available, Patient being monitored and Timeout performed Patient Re-evaluated:Patient Re-evaluated prior to induction Oxygen Delivery Method: Circle system utilized Preoxygenation: Pre-oxygenation with 100% oxygen Induction Type: IV induction Ventilation: Mask ventilation without difficulty Laryngoscope Size: Mac and 4 Grade View: Grade II Tube type: Oral Tube size: 7.5 mm Number of attempts: 1 Airway Equipment and Method: Stylet Placement Confirmation: ETT inserted through vocal cords under direct vision, positive ETCO2 and breath sounds checked- equal and bilateral Secured at: 22 cm Tube secured with: Tape Dental Injury: Teeth and Oropharynx as per pre-operative assessment

## 2022-05-14 ENCOUNTER — Encounter (HOSPITAL_COMMUNITY): Payer: Self-pay | Admitting: Specialist

## 2022-05-14 NOTE — Anesthesia Postprocedure Evaluation (Signed)
Anesthesia Post Note  Patient: [name]  Procedure(s) Performed: Microlumbar laminectomy Lumbar three-four , Lumbar four-five     Patient location during evaluation: PACU Anesthesia Type: General Level of consciousness: awake and alert Pain management: pain level controlled Vital Signs Assessment: post-procedure vital signs reviewed and stable Respiratory status: spontaneous breathing, nonlabored ventilation, respiratory function stable and patient connected to nasal cannula oxygen Cardiovascular status: blood pressure returned to baseline and stable Postop Assessment: no apparent nausea or vomiting Anesthetic complications: no   No notable events documented.  Last Vitals:  Vitals:   05/13/22 1415 05/13/22 1447  BP: 134/77 (!) 155/85  Pulse: (!) 47 (!) 45  Resp: 12 18  Temp:  (!) 36.4 C  SpO2: 97% 100%    Last Pain:  Vitals:   05/13/22 1447  TempSrc: Oral  PainSc: [place]

## 2022-05-18 ENCOUNTER — Inpatient Hospital Stay (HOSPITAL_COMMUNITY): Admission: RE | Admit: 2022-05-18 | Payer: Medicare Other | Source: Ambulatory Visit

## 2022-05-19 ENCOUNTER — Other Ambulatory Visit: Payer: Self-pay | Admitting: Family Medicine

## 2022-05-19 DIAGNOSIS — E039 Hypothyroidism, unspecified: Secondary | ICD-10-CM

## 2022-05-24 ENCOUNTER — Other Ambulatory Visit: Payer: Self-pay | Admitting: Family Medicine

## 2022-05-24 DIAGNOSIS — I10 Essential (primary) hypertension: Secondary | ICD-10-CM

## 2022-05-30 ENCOUNTER — Other Ambulatory Visit: Payer: Self-pay | Admitting: Family Medicine

## 2022-05-30 DIAGNOSIS — M109 Gout, unspecified: Secondary | ICD-10-CM

## 2022-06-04 ENCOUNTER — Other Ambulatory Visit: Payer: Self-pay | Admitting: *Deleted

## 2022-06-04 DIAGNOSIS — R600 Localized edema: Secondary | ICD-10-CM

## 2022-06-08 ENCOUNTER — Ambulatory Visit (INDEPENDENT_AMBULATORY_CARE_PROVIDER_SITE_OTHER): Payer: Medicare Other | Admitting: Physician Assistant

## 2022-06-08 ENCOUNTER — Ambulatory Visit (HOSPITAL_COMMUNITY)
Admission: RE | Admit: 2022-06-08 | Discharge: 2022-06-08 | Disposition: A | Discharge status: Home / Self Care | Payer: Medicare Other | Source: Ambulatory Visit | Attending: Vascular Surgery | Admitting: Vascular Surgery

## 2022-06-08 VITALS — BP 143/82 | HR 55 | Temp 98.2°F | Resp 20 | Ht 72.0 in | Wt 235.3 lb

## 2022-06-08 DIAGNOSIS — R600 Localized edema: Secondary | ICD-10-CM | POA: Diagnosis not present

## 2022-06-08 DIAGNOSIS — I824Z1 Acute embolism and thrombosis of unspecified deep veins of right distal lower extremity: Secondary | ICD-10-CM | POA: Diagnosis not present

## 2022-06-08 DIAGNOSIS — M7989 Other specified soft tissue disorders: Secondary | ICD-10-CM

## 2022-06-08 MED ORDER — RIVAROXABAN (XARELTO) VTE STARTER PACK (15 & 20 MG): ORAL_TABLET | ORAL | 0 refills | Status: DC

## 2022-06-08 NOTE — Progress Notes (Signed)
Requested by:  Haydee Salter, MD [place],  [place] [address]  Reason for consultation: R leg edema    History of Present Illness   [name] is a 76 y.o. (1946-03-13) male who presents for evaluation of R leg edema. He explains that he has had swelling on and off for many years. Was evaluated on multiple occasions when in [place] and they could never find cause. He then says 2 years ago he had his right knee replaced and since that time the swelling has been constant. He has no aching, no throbbing, no pain, no itching, no burning, no heaviness or tiredness. The swelling does not change with prolonged standing or ambulation. It does not improve with elevation. He says he does elevate in recliner occasionally. He has never worn compression stockings. He does have some small spider veins on both legs but these are asymptomatic. He has no history of DVT or SVT. No family history of DVT or venous disease.   Venous symptoms include: swelling Onset/duration:  many years ago  Occupation:  retired Aggravating factors: none Alleviating factors: none Compression:  No Helps:  n/a Pain medications:  No Previous vein procedures:  No History of DVT:  No  Past Medical History:  Diagnosis Date   Arthritis    knees. back   Bladder cancer ([facility])    2018   High cholesterol    Hypertension    Hypothyroidism    prostate cancer    2000, 2003   Thyroid disease     Past Surgical History:  Procedure Laterality Date   EYE SURGERY Bilateral 2014   HERNIA REPAIR Right 1984   inguinal   IR RADIOLOGIST EVAL & MGMT  03/19/2021   KNEE ARTHROSCOPY Left 1966   LUMBAR LAMINECTOMY/DECOMPRESSION MICRODISCECTOMY N/A 05/13/2022   Procedure: Microlumbar laminectomy Lumbar three-four , Lumbar four-five;  Surgeon: Susa Day, MD;  Location: [facility];  Service: Orthopedics;  Laterality: N/A;   PENILE PROSTHESIS IMPLANT  2014   TOTAL KNEE ARTHROPLASTY Right 08/11/2020   Procedure: TOTAL  KNEE ARTHROPLASTY;  Surgeon: Gaynelle Arabian, MD;  Location: [facility];  Service: Orthopedics;  Laterality: Right;  66mn   TRANSURETHRAL RESECTION OF BLADDER TUMOR  2018    Social History   Socioeconomic History   Marital status: Married    Spouse name: Not on file   Number of children: Not on file   Years of education: Not on file   Highest education level: Not on file  Occupational History   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  Tobacco Use   Smoking status: Never    Passive exposure: Never   Smokeless tobacco: Never  Vaping Use   Vaping Use: Former   Start date: 08/01/2017   Substances: THC  Substance and Sexual Activity   Alcohol use: Yes    Alcohol/week: 2.0 standard drinks of alcohol    Types: 2 Standard drinks or equivalent per week    Comment: rarely   Drug use: Not Currently    Types: Marijuana    Comment: none in at least 1 year   Sexual activity: Yes  Other Topics Concern   Not on file  Social History Narrative   Not on file   Social Determinants of Health   Financial Resource Strain: Low Risk  (12/02/2021)   Overall Financial Resource Strain (CARDIA)    Difficulty of Paying Living Expenses: Not hard at all  Food Insecurity: No Food Insecurity (12/02/2021)   Hunger  Vital Sign    Worried About Charity fundraiser in the Last Year: Never true    Ran Out of Food in the Last Year: Never true  Transportation Needs: No Transportation Needs (12/02/2021)   PRAPARE - Hydrologist (Medical): No    Lack of Transportation (Non-Medical): No  Physical Activity: Insufficiently Active (12/02/2021)   Exercise Vital Sign    Days of Exercise per Week: 3 days    Minutes of Exercise per Session: 20 min  Stress: No Stress Concern Present (12/02/2021)   [place]    Feeling of Stress : Not at all  Social Connections: Moderately Integrated (12/02/2021)   Social Connection and Isolation Panel  [NHANES]    Frequency of Communication with Friends and Family: Twice a week    Frequency of Social Gatherings with Friends and Family: Twice a week    Attends Religious Services: More than 4 times per year    Active Member of [employer] or Organizations: No    Attends Archivist Meetings: Never    Marital Status: Married  Human resources officer Violence: Not At Risk (12/02/2021)   Humiliation, Afraid, Rape, and Kick questionnaire    Fear of Current or Ex-Partner: No    Emotionally Abused: No    Physically Abused: No    Sexually Abused: No    Family History  Problem Relation Age of Onset   Cancer Neg Hx     Current Outpatient Medications  Medication Sig Dispense Refill   allopurinol (ZYLOPRIM) 100 MG tablet TAKE 1 TABLET(100 MG) BY MOUTH TWICE DAILY 180 tablet 3   carvedilol (COREG) 25 MG tablet TAKE 1 TABLET(25 MG) BY MOUTH TWICE DAILY 180 tablet 2   Coenzyme Q10 (COQ10 PO) Take 1 Dose by mouth daily. Liquid form     docusate sodium (COLACE) 100 MG capsule Take 1 capsule (100 mg total) by mouth 2 (two) times daily as needed for mild constipation. 30 capsule 1   folic acid (FOLVITE) 629 MCG tablet Take 800 mcg by mouth daily.     gabapentin (NEURONTIN) 300 MG capsule Take 300 mg by mouth 2 (two) times daily.     levothyroxine (SYNTHROID) 88 MCG tablet TAKE 1 TABLET(88 MCG) BY MOUTH DAILY BEFORE BREAKFAST 90 tablet 1   methocarbamol (ROBAXIN) 500 MG tablet Take 1 tablet (500 mg total) by mouth every 8 (eight) hours as needed for muscle spasms. 21 tablet 0   OVER THE COUNTER MEDICATION Take 2 capsules by mouth daily. Brain Supplement     OVER THE COUNTER MEDICATION Take 2 tablets by mouth daily. Flush ( for toenail fungus)     oxyCODONE (OXY IR/ROXICODONE) 5 MG immediate release tablet Take 1 tablet (5 mg total) by mouth every 4 (four) hours as needed for severe pain. 40 tablet 0   polyethylene glycol (MIRALAX / GLYCOLAX) 17 g packet Take 17 g by mouth daily. 14 each 0   rosuvastatin  (CRESTOR) 10 MG tablet TAKE 1 TABLET(10 MG) BY MOUTH DAILY 90 tablet 3   No current facility-administered medications for this visit.    No Known Allergies  REVIEW OF SYSTEMS (negative unless checked):   Cardiac:  '[]'$  Chest pain or chest pressure? '[]'$  Shortness of breath upon activity? '[]'$  Shortness of breath when lying flat? '[]'$  Irregular heart rhythm?  Vascular:  '[]'$  Pain in calf, thigh, or hip brought on by walking? '[]'$  Pain in feet at night that wakes  you up from your sleep? '[]'$  Blood clot in your veins? '[x]'$  Leg swelling?  Pulmonary:  '[]'$  Oxygen at home? '[]'$  Productive cough? '[]'$  Wheezing?  Neurologic:  '[]'$  Sudden weakness in arms or legs? '[]'$  Sudden numbness in arms or legs? '[]'$  Sudden onset of difficult speaking or slurred speech? '[]'$  Temporary loss of vision in one eye? '[]'$  Problems with dizziness?  Gastrointestinal:  '[]'$  Blood in stool? '[]'$  Vomited blood?  Genitourinary:  '[]'$  Burning when urinating? '[]'$  Blood in urine?  Psychiatric:  '[]'$  Major depression  Hematologic:  '[]'$  Bleeding problems? '[]'$  Problems with blood clotting?  Dermatologic:  '[]'$  Rashes or ulcers?  Constitutional:  '[]'$  Fever or chills?  Ear/Nose/Throat:  '[]'$  Change in hearing? '[]'$  Nose bleeds? '[]'$  Sore throat?  Musculoskeletal:  '[]'$  Back pain? '[]'$  Joint pain? '[]'$  Muscle pain?   Physical Examination     Vitals:   06/08/22 1351  BP: (!) 143/82  Pulse: (!) 55  Resp: 20  Temp: 98.2 F (36.8 C)  TempSrc: Temporal  SpO2: 99%  Weight: 235 lb 4.8 oz (106.7 kg)  Height: 6' (1.829 m)   Body mass index is 31.91 kg/m.  General:  WDWN in NAD; vital signs documented above Gait: Normal HENT: WNL, normocephalic Pulmonary: normal non-labored breathing Cardiac: regular HR Vascular Exam/Pulses:  Right Left  Radial 2+ (normal) 2+ (normal)  Femoral 2+ (normal) 2+ (normal)  Popliteal 2+ (normal) 2+ (normal)  DP 2+ (normal) 2+ (normal)  PT 2+ (normal) 2+ (normal)   Extremities: without varicose veins,  with reticular veins of both thighs and ankles, right anterior leg with edema of right leg and ankle, without stasis pigmentation, without lipodermatosclerosis, without ulcers Musculoskeletal: no muscle wasting or atrophy  Neurologic: A&O X 3;  No focal weakness or paresthesias are detected Psychiatric:  The pt has Normal affect.  Non-invasive Vascular Imaging   BLE Venous Insufficiency Duplex (06/08/22):  RLE:  Non occlusive age indeterminate DVT in the CFV, PFV, FV, popliteal vein, and PTV in mid calf. Also non occlusive distal EIV thrombus Reflux scan was not completed secondary to DVT  Medical Decision Making   [name] is a 76 y.o. male who presents with age indeterminate DVT. Based on patients history it is hard to determine acuteness of DVT. He has no history of DVT. Possible that it occurred after right knee replacement in 2021 when the swelling became more constant but hard to know. Duplex today shows non occlusive age indeterminate DVT in EIV, CFV, PFV, FV, popliteal vein, and PTV. Aside from some swelling patient is minimally symptomatic. Based on the patient's history and examination, I recommend placing him on 3 months of Xarelto. I will send Rx for starter pack of 15 mg BID x 21 days and then 20 mg daily after. Discussed possible side effects of Xarelto. I do not think with duplex findings and patients minimal symptoms that a mechanical thrombectomy is indicated. He was measured for knee high compression stockings and I discussed use of these and daily elevation. He will follow up in 3 months for duplex. Hopefully at that time also we can evaluate him for venous reflux. He knows should he have any new or worsening symptoms to call for earlier follow up.   Karoline Caldwell, PA-C [facility] Office: [phone]  06/08/2022, 1:57 PM  Clinic MD: Roxanne Mins

## 2022-06-11 ENCOUNTER — Other Ambulatory Visit: Payer: Self-pay

## 2022-06-11 DIAGNOSIS — R600 Localized edema: Secondary | ICD-10-CM

## 2022-06-14 ENCOUNTER — Telehealth: Payer: Self-pay

## 2022-06-14 NOTE — Telephone Encounter (Signed)
Pt called stating that the new medication he was prescribed is expensive and was seeking advice.  Reviewed pt's chart, returned call for clarification, no answer, lf vm regarding Xarelto savings cards.  Pt returned call, returned call, no answer.  Pt was not inquiring about the cost, but rather symptoms that he was experiencing. Pt stated that he had slight hematuria, generalized tiredness, and occasional slightly blurry vision. Spoke with [place], [place], who advised for pt to continue while monitoring symptoms. Instructed pt to call back within a week if symptoms had either not improved or worsened. Confirmed understanding.

## 2022-06-27 NOTE — Progress Notes (Unsigned)
Cardiology Office Note:    Date:  06/28/2022   ID:  [name], DOB Feb 23, 1946, MRN [id number]  PCP:  Haydee Salter, MD  Cardiologist:  None  Electrophysiologist:  None   Referring MD: Haydee Salter, MD   Chief Complaint/Reason for Referral: Dyspnea on exertion, Fatigue  History of Present Illness:    [name] is a 76 y.o. male with a history of osteoarthritis, prostate cancer s/p radiation therapy, hyperlipidemia, essential hypertension, pre-diabeties, obesity and hypothyroidism. An ambulatory referral was made to cardiology for evaluation of severe fatigue and DOE.  We evaluated the patient for DOE with echo and CCTA. No significant cardiac abnormalities however was found to have a liver abscess which was I&D, with resolution of DOE.   Recently dx with age indeterminate DVT now on Xarelto per [facility]. Recent spinal decompression lumbar surgery.   ***    Past Medical History:  Diagnosis Date   Arthritis    knees. back   Bladder cancer ([facility])    2018   High cholesterol    Hypertension    Hypothyroidism    prostate cancer    2000, 2003   Thyroid disease     Past Surgical History:  Procedure Laterality Date   EYE SURGERY Bilateral 2014   HERNIA REPAIR Right 1984   inguinal   IR RADIOLOGIST EVAL & MGMT  03/19/2021   KNEE ARTHROSCOPY Left 1966   LUMBAR LAMINECTOMY/DECOMPRESSION MICRODISCECTOMY N/A 05/13/2022   Procedure: Microlumbar laminectomy Lumbar three-four , Lumbar four-five;  Surgeon: Susa Day, MD;  Location: [facility];  Service: Orthopedics;  Laterality: N/A;   PENILE PROSTHESIS IMPLANT  2014   TOTAL KNEE ARTHROPLASTY Right 08/11/2020   Procedure: TOTAL KNEE ARTHROPLASTY;  Surgeon: Gaynelle Arabian, MD;  Location: [facility];  Service: Orthopedics;  Laterality: Right;  78mn   TRANSURETHRAL RESECTION OF BLADDER TUMOR  2018    Current Medications: No outpatient medications have been marked as taking for the 06/28/22 encounter (Appointment) with AElouise Munroe MD.     Allergies:   Patient has no known allergies.   Social History   Tobacco Use   Smoking status: Never    Passive exposure: Never   Smokeless tobacco: Never  Vaping Use   Vaping Use: Former   Start date: 08/01/2017   Substances: THC  Substance Use Topics   Alcohol use: Yes    Alcohol/week: 2.0 standard drinks of alcohol    Types: 2 Standard drinks or equivalent per week    Comment: rarely   Drug use: Not Currently    Types: Marijuana    Comment: none in at least 1 year     Family History: The patient's family history is negative for Cancer.  ROS:   Please see the history of present illness.    (+) Constant chest pressure (+) Shortness of breath (+) Significant DOE (+) Fatigue (+) Strained voice (+) Depressed (+) Tearful (+) Loss of appetite All other systems reviewed and are negative.  EKGs/Labs/Other Studies Reviewed:    The following studies were reviewed today:  Echo 02/13/2021: 1. Left ventricular ejection fraction, by estimation, is 60 to 65%. The  left ventricle has normal function. The left ventricle has no regional  wall motion abnormalities. Left ventricular diastolic parameters are  consistent with Grade I diastolic  dysfunction (impaired relaxation).   2. Right ventricular systolic function is mildly reduced. The right  ventricular size is normal.   3. 3.5 x 6.5 cm echolucent oval structure adjacent to the  posterior wall  -apperas extracardia - seems to compress the RV some. Consider pericardial  cyst, hiatal hernia or other extracardiac masses such as hepatic mass/cyst  - cross-sectional imaging such   as CT may be helpful.   4. The mitral valve is abnormal. Trivial mitral valve regurgitation.   5. The aortic valve is calcified. Aortic valve regurgitation is not  visualized. Mild aortic valve stenosis. Aortic valve area, by VTI measures  1.71 cm. Aortic valve mean gradient measures 13.0 mmHg. Aortic valve Vmax  measures 2.41 m/s.  Peak gradient  23.2 mmHg. DI is 0.54.   6. The inferior vena cava is normal in size with greater than 50%  respiratory variability, suggesting right atrial pressure of 3 mmHg.   Conclusion(s)/Recommendation(s): Extracardiac echolucent structure -  recommend additional cross-sectional imaging. Critical findings reported  to Dr. Margaretann Loveless and acknowledged at 4:40 pm on 02/13/2021.  EKG:  02/24/2021: NSR, rate 66 02/13/2021: NSR, rate 65  I have independently reviewed the images from chest x-ray 01/06/2021.  No evidence of pulmonary interstitial edema or cardiomegaly.  Recent Labs: 04/20/2022: ALT 26; BUN 26; Creatinine, Ser 1.18; Hemoglobin 15.3; Platelets 132.0; Potassium 4.3; Sodium 137; TSH 1.38  Recent Lipid Panel    Component Value Date/Time   CHOL 136 07/17/2021 0833   TRIG 187.0 (H) 07/17/2021 0833   HDL 43.30 07/17/2021 0833   CHOLHDL 3 07/17/2021 0833   VLDL 37.4 07/17/2021 0833   LDLCALC 56 07/17/2021 0833   LDLDIRECT 81 01/11/2020 1656    Physical Exam:    VS:  There were no vitals taken for this visit.    Wt Readings from Last 5 Encounters:  06/08/22 235 lb 4.8 oz (106.7 kg)  05/13/22 235 lb (106.6 kg)  05/10/22 235 lb 4.8 oz (106.7 kg)  04/20/22 231 lb 3.2 oz (104.9 kg)  03/10/22 239 lb 12.8 oz (108.8 kg)    Constitutional: No acute distress Eyes: sclera non-icteric, normal conjunctiva and lids, tearful at times ENMT: normal dentition, moist mucous membranes Cardiovascular: regular rhythm, normal rate, 1/6 HS murmur at the apex. S1 and S2 normal. Radial pulses normal bilaterally. No jugular venous distention.  Respiratory: clear to auscultation bilaterally GI : normal bowel sounds, soft and nontender. No distention.   MSK: extremities warm, well perfused. Varicose veins distally.  Bilateral trace ankle edema.  NEURO: grossly nonfocal exam, moves all extremities. PSYCH: alert and oriented x 3, normal mood and affect.   ASSESSMENT:    No diagnosis  found.   PLAN:    Dyspnea on exertion  Total time of encounter: 30 minutes total time of encounter, including 25 minutes spent in face-to-face patient care on the date of this encounter. This time includes coordination of care and counseling regarding above mentioned problem list. Remainder of non-face-to-face time involved reviewing chart documents/testing relevant to the patient encounter and documentation in the medical record. I have independently reviewed documentation from referring provider.   Cherlynn Kaiser, MD, [facility]      Medication Adjustments/Labs and Tests Ordered: Current medicines are reviewed at length with the patient today.  Concerns regarding medicines are outlined above.   No orders of the defined types were placed in this encounter.    No orders of the defined types were placed in this encounter.    There are no Patient Instructions on file for this visit.

## 2022-06-28 ENCOUNTER — Ambulatory Visit (INDEPENDENT_AMBULATORY_CARE_PROVIDER_SITE_OTHER): Payer: Medicare Other | Admitting: Internal Medicine

## 2022-06-28 ENCOUNTER — Encounter: Payer: Self-pay | Admitting: Internal Medicine

## 2022-06-28 VITALS — BP 118/73 | HR 54 | Ht 72.0 in | Wt 234.4 lb

## 2022-06-28 DIAGNOSIS — A4159 Other Gram-negative sepsis: Secondary | ICD-10-CM | POA: Diagnosis not present

## 2022-06-28 DIAGNOSIS — A419 Sepsis, unspecified organism: Secondary | ICD-10-CM | POA: Diagnosis not present

## 2022-06-28 DIAGNOSIS — I350 Nonrheumatic aortic (valve) stenosis: Secondary | ICD-10-CM | POA: Insufficient documentation

## 2022-06-28 DIAGNOSIS — I700 Atherosclerosis of aorta: Secondary | ICD-10-CM | POA: Insufficient documentation

## 2022-06-28 DIAGNOSIS — R0609 Other forms of dyspnea: Secondary | ICD-10-CM | POA: Insufficient documentation

## 2022-06-28 DIAGNOSIS — I10 Essential (primary) hypertension: Secondary | ICD-10-CM | POA: Insufficient documentation

## 2022-06-28 DIAGNOSIS — E785 Hyperlipidemia, unspecified: Secondary | ICD-10-CM | POA: Insufficient documentation

## 2022-06-28 NOTE — Patient Instructions (Signed)
Medication Instructions:  No Changes In Medications at this time.  *If you need a refill on your cardiac medications before your next appointment, please call your pharmacy*  Lab Work: None Ordered At This Time.  If you have labs (blood work) drawn today and your tests are completely normal, you will receive your results only by: [place] (if you have MyChart) OR A paper copy in the mail If you have any lab test that is abnormal or we need to change your treatment, we will call you to review the results.  Testing/Procedures: Your physician has requested that you have an echocardiogram NEXT AVAILABLE . Echocardiography is a painless test that uses sound waves to create images of your heart. It provides your doctor with information about the size and shape of your heart and how well your heart's chambers and valves are working. You may receive an ultrasound enhancing agent through an IV if needed to better visualize your heart during the echo.This procedure takes approximately one hour. There are no restrictions for this procedure. This will take place at the [address].   Follow-Up: At [facility], you and your health needs are our priority.  As part of our continuing mission to provide you with exceptional heart care, we have created designated Provider Care Teams.  These Care Teams include your primary Cardiologist (physician) and Advanced Practice Providers (APPs -  Physician Assistants and Nurse Practitioners) who all work together to provide you with the care you need, when you need it.  Your next appointment:   6 month(s)  The format for your next appointment:   In Person  Provider:   Elouise Munroe, MD      PLEASE CALL us IN 6-8 WEEKS TO FOLLOW UP ON [place]

## 2022-07-01 ENCOUNTER — Encounter (HOSPITAL_COMMUNITY): Payer: Self-pay

## 2022-07-01 ENCOUNTER — Other Ambulatory Visit: Payer: Self-pay

## 2022-07-01 ENCOUNTER — Inpatient Hospital Stay (HOSPITAL_COMMUNITY)
Admission: EM | Admit: 2022-07-01 | Discharge: 2022-07-05 | DRG: 871 | Disposition: A | Discharge status: Home / Self Care | Payer: Medicare Other | Attending: Internal Medicine | Admitting: Internal Medicine

## 2022-07-01 ENCOUNTER — Emergency Department (HOSPITAL_COMMUNITY): Payer: Medicare Other

## 2022-07-01 DIAGNOSIS — A419 Sepsis, unspecified organism: Secondary | ICD-10-CM | POA: Diagnosis present

## 2022-07-01 DIAGNOSIS — N1831 Chronic kidney disease, stage 3a: Secondary | ICD-10-CM | POA: Diagnosis present

## 2022-07-01 DIAGNOSIS — N390 Urinary tract infection, site not specified: Secondary | ICD-10-CM | POA: Diagnosis not present

## 2022-07-01 DIAGNOSIS — N179 Acute kidney failure, unspecified: Secondary | ICD-10-CM | POA: Diagnosis present

## 2022-07-01 DIAGNOSIS — I5033 Acute on chronic diastolic (congestive) heart failure: Secondary | ICD-10-CM | POA: Diagnosis present

## 2022-07-01 DIAGNOSIS — N202 Calculus of kidney with calculus of ureter: Secondary | ICD-10-CM | POA: Diagnosis present

## 2022-07-01 DIAGNOSIS — Z96651 Presence of right artificial knee joint: Secondary | ICD-10-CM | POA: Diagnosis present

## 2022-07-01 DIAGNOSIS — Z6831 Body mass index (BMI) 31.0-31.9, adult: Secondary | ICD-10-CM

## 2022-07-01 DIAGNOSIS — I350 Nonrheumatic aortic (valve) stenosis: Secondary | ICD-10-CM | POA: Diagnosis present

## 2022-07-01 DIAGNOSIS — Z79899 Other long term (current) drug therapy: Secondary | ICD-10-CM

## 2022-07-01 DIAGNOSIS — R6520 Severe sepsis without septic shock: Secondary | ICD-10-CM | POA: Diagnosis present

## 2022-07-01 DIAGNOSIS — I700 Atherosclerosis of aorta: Secondary | ICD-10-CM | POA: Diagnosis present

## 2022-07-01 DIAGNOSIS — Z20822 Contact with and (suspected) exposure to covid-19: Secondary | ICD-10-CM | POA: Diagnosis present

## 2022-07-01 DIAGNOSIS — E039 Hypothyroidism, unspecified: Secondary | ICD-10-CM | POA: Diagnosis present

## 2022-07-01 DIAGNOSIS — Z9079 Acquired absence of other genital organ(s): Secondary | ICD-10-CM

## 2022-07-01 DIAGNOSIS — N12 Tubulo-interstitial nephritis, not specified as acute or chronic: Secondary | ICD-10-CM | POA: Diagnosis present

## 2022-07-01 DIAGNOSIS — A4159 Other Gram-negative sepsis: Secondary | ICD-10-CM | POA: Diagnosis present

## 2022-07-01 DIAGNOSIS — Z79890 Hormone replacement therapy (postmenopausal): Secondary | ICD-10-CM

## 2022-07-01 DIAGNOSIS — E079 Disorder of thyroid, unspecified: Secondary | ICD-10-CM | POA: Diagnosis present

## 2022-07-01 DIAGNOSIS — I130 Hypertensive heart and chronic kidney disease with heart failure and stage 1 through stage 4 chronic kidney disease, or unspecified chronic kidney disease: Secondary | ICD-10-CM | POA: Diagnosis present

## 2022-07-01 DIAGNOSIS — M109 Gout, unspecified: Secondary | ICD-10-CM | POA: Diagnosis present

## 2022-07-01 DIAGNOSIS — E669 Obesity, unspecified: Secondary | ICD-10-CM | POA: Diagnosis present

## 2022-07-01 DIAGNOSIS — Z86718 Personal history of other venous thrombosis and embolism: Secondary | ICD-10-CM

## 2022-07-01 DIAGNOSIS — E7800 Pure hypercholesterolemia, unspecified: Secondary | ICD-10-CM | POA: Diagnosis present

## 2022-07-01 DIAGNOSIS — I2510 Atherosclerotic heart disease of native coronary artery without angina pectoris: Secondary | ICD-10-CM | POA: Diagnosis present

## 2022-07-01 DIAGNOSIS — E876 Hypokalemia: Secondary | ICD-10-CM | POA: Diagnosis present

## 2022-07-01 DIAGNOSIS — Z23 Encounter for immunization: Secondary | ICD-10-CM

## 2022-07-01 DIAGNOSIS — Z8546 Personal history of malignant neoplasm of prostate: Secondary | ICD-10-CM | POA: Diagnosis not present

## 2022-07-01 DIAGNOSIS — Z8551 Personal history of malignant neoplasm of bladder: Secondary | ICD-10-CM

## 2022-07-01 DIAGNOSIS — K5790 Diverticulosis of intestine, part unspecified, without perforation or abscess without bleeding: Secondary | ICD-10-CM | POA: Diagnosis present

## 2022-07-01 DIAGNOSIS — I5031 Acute diastolic (congestive) heart failure: Secondary | ICD-10-CM | POA: Diagnosis not present

## 2022-07-01 DIAGNOSIS — Z7901 Long term (current) use of anticoagulants: Secondary | ICD-10-CM

## 2022-07-01 DIAGNOSIS — Z923 Personal history of irradiation: Secondary | ICD-10-CM | POA: Diagnosis not present

## 2022-07-01 DIAGNOSIS — K750 Abscess of liver: Secondary | ICD-10-CM | POA: Diagnosis present

## 2022-07-01 LAB — COMPREHENSIVE METABOLIC PANEL
ALT: 21 U/L (ref 0–44)
AST: 24 U/L (ref 15–41)
Albumin: 2.9 g/dL — ABNORMAL LOW (ref 3.5–5.0)
Alkaline Phosphatase: 73 U/L (ref 38–126)
Anion gap: 9 (ref 5–15)
BUN: 27 mg/dL — ABNORMAL HIGH (ref 8–23)
CO2: 21 mmol/L — ABNORMAL LOW (ref 22–32)
Calcium: 9.8 mg/dL (ref 8.9–10.3)
Chloride: 106 mmol/L (ref 98–111)
Creatinine, Ser: 2.07 mg/dL — ABNORMAL HIGH (ref 0.61–1.24)
GFR, Estimated: 33 mL/min — ABNORMAL LOW (ref >60)
Glucose, Bld: 107 mg/dL — ABNORMAL HIGH (ref 70–99)
Potassium: 4.3 mmol/L (ref 3.5–5.1)
Sodium: 136 mmol/L (ref 135–145)
Total Bilirubin: 1.3 mg/dL — ABNORMAL HIGH (ref 0.3–1.2)
Total Protein: 6.5 g/dL (ref 6.5–8.1)

## 2022-07-01 LAB — CBC WITH DIFFERENTIAL/PLATELET
Abs Immature Granulocytes: 0.08 10*3/uL — ABNORMAL HIGH (ref 0.00–0.07)
Basophils Absolute: 0 10*3/uL (ref 0.0–0.1)
Basophils Relative: 0 %
Eosinophils Absolute: 0 10*3/uL (ref 0.0–0.5)
Eosinophils Relative: 0 %
HCT: 40.1 % (ref 39.0–52.0)
Hemoglobin: 13.5 g/dL (ref 13.0–17.0)
Immature Granulocytes: 1 %
Lymphocytes Relative: 4 %
Lymphs Abs: 0.4 10*3/uL — ABNORMAL LOW (ref 0.7–4.0)
MCH: 30.3 pg (ref 26.0–34.0)
MCHC: 33.7 g/dL (ref 30.0–36.0)
MCV: 90.1 fL (ref 80.0–100.0)
Monocytes Absolute: 0.6 10*3/uL (ref 0.1–1.0)
Monocytes Relative: 5 %
Neutro Abs: 9.2 10*3/uL — ABNORMAL HIGH (ref 1.7–7.7)
Neutrophils Relative %: 90 %
Platelets: 152 10*3/uL (ref 150–400)
RBC: 4.45 MIL/uL (ref 4.22–5.81)
RDW: 14.8 % (ref 11.5–15.5)
WBC: 10.3 10*3/uL (ref 4.0–10.5)
nRBC: 0 % (ref 0.0–0.2)

## 2022-07-01 LAB — BLOOD GAS, VENOUS
Acid-base deficit: 3.9 mmol/L — ABNORMAL HIGH (ref 0.0–2.0)
Bicarbonate: 20.8 mmol/L (ref 20.0–28.0)
O2 Saturation: 52 %
Patient temperature: 36.5
pCO2, Ven: 35 mmHg — ABNORMAL LOW (ref 44–60)
pH, Ven: 7.38 (ref 7.25–7.43)
pO2, Ven: <31 mmHg — CL (ref 32–45)

## 2022-07-01 LAB — URIC ACID: Uric Acid, Serum: 5.9 mg/dL (ref 3.7–8.6)

## 2022-07-01 LAB — URINALYSIS, ROUTINE W REFLEX MICROSCOPIC
Bilirubin Urine: NEGATIVE
Glucose, UA: NEGATIVE mg/dL
Ketones, ur: NEGATIVE mg/dL
Nitrite: NEGATIVE
Protein, ur: 100 mg/dL — AB
RBC / HPF: >50 RBC/hpf — ABNORMAL HIGH (ref 0–5)
Specific Gravity, Urine: 1.016 (ref 1.005–1.030)
WBC, UA: >50 WBC/hpf — ABNORMAL HIGH (ref 0–5)
pH: 5 (ref 5.0–8.0)

## 2022-07-01 LAB — SARS CORONAVIRUS 2 BY RT PCR: SARS Coronavirus 2 by RT PCR: NEGATIVE

## 2022-07-01 LAB — LACTIC ACID, PLASMA
Lactic Acid, Venous: 2.1 mmol/L (ref 0.5–1.9)
Lactic Acid, Venous: 2 mmol/L (ref 0.5–1.9)

## 2022-07-01 LAB — PROTIME-INR
INR: 2.7 — ABNORMAL HIGH (ref 0.8–1.2)
Prothrombin Time: 28.7 seconds — ABNORMAL HIGH (ref 11.4–15.2)

## 2022-07-01 LAB — PROCALCITONIN: Procalcitonin: 30.5 ng/mL

## 2022-07-01 MED ORDER — SODIUM CHLORIDE 0.9 % IV SOLN: 2.0000 g | Freq: Two times a day (BID) | INTRAVENOUS | Status: DC

## 2022-07-01 MED ORDER — PROBIOTIC GUMMIES 30 MG PO CHEW: CHEWABLE_TABLET | Freq: Every day | ORAL | Status: DC

## 2022-07-01 MED ORDER — BUDESONIDE 0.25 MG/2ML IN SUSP: 0.2500 mg | Freq: Two times a day (BID) | RESPIRATORY_TRACT | Status: DC

## 2022-07-01 MED ORDER — OXYCODONE HCL 5 MG PO TABS: 5.0000 mg | ORAL_TABLET | ORAL | Status: DC | PRN

## 2022-07-01 MED ORDER — ROSUVASTATIN CALCIUM 5 MG PO TABS
10.0000 mg | ORAL_TABLET | Freq: Every day | ORAL | Status: DC
  Administered 2022-07-01 – 2022-07-05 (×5): 10 mg via ORAL
  Filled 2022-07-01 (×5): qty 2

## 2022-07-01 MED ORDER — SODIUM CHLORIDE 0.9 % IV BOLUS (SEPSIS)
1000.0000 mL | Freq: Once | INTRAVENOUS | Status: AC
  Administered 2022-07-01: 1000 mL via INTRAVENOUS

## 2022-07-01 MED ORDER — VANCOMYCIN HCL 1500 MG/300ML IV SOLN: 1500.0000 mg | INTRAVENOUS | Status: DC

## 2022-07-01 MED ORDER — RIVAROXABAN 20 MG PO TABS: 20.0000 mg | ORAL_TABLET | Freq: Every day | ORAL | Status: DC

## 2022-07-01 MED ORDER — LEVOTHYROXINE SODIUM 88 MCG PO TABS
88.0000 ug | ORAL_TABLET | Freq: Every day | ORAL | Status: DC
  Administered 2022-07-02 – 2022-07-05 (×4): 88 ug via ORAL
  Filled 2022-07-01 (×4): qty 1

## 2022-07-01 MED ORDER — ACETAMINOPHEN 325 MG PO TABS
650.0000 mg | ORAL_TABLET | Freq: Four times a day (QID) | ORAL | Status: DC | PRN
  Administered 2022-07-02 – 2022-07-05 (×3): 650 mg via ORAL
  Filled 2022-07-01 (×3): qty 2

## 2022-07-01 MED ORDER — ACETAMINOPHEN 650 MG RE SUPP: 650.0000 mg | Freq: Four times a day (QID) | RECTAL | Status: DC | PRN

## 2022-07-01 MED ORDER — SODIUM CHLORIDE 0.9 % IV SOLN: INTRAVENOUS | Status: DC

## 2022-07-01 MED ORDER — IPRATROPIUM-ALBUTEROL 0.5-2.5 (3) MG/3ML IN SOLN
3.0000 mL | Freq: Two times a day (BID) | RESPIRATORY_TRACT | Status: DC
  Filled 2022-07-01: qty 3

## 2022-07-01 MED ORDER — ONDANSETRON HCL 4 MG PO TABS
4.0000 mg | ORAL_TABLET | Freq: Four times a day (QID) | ORAL | Status: DC | PRN
  Administered 2022-07-03: 4 mg via ORAL
  Filled 2022-07-01: qty 1

## 2022-07-01 MED ORDER — BUDESONIDE 0.25 MG/2ML IN SUSP
0.2500 mg | Freq: Two times a day (BID) | RESPIRATORY_TRACT | Status: DC
  Administered 2022-07-01: 0.25 mg via RESPIRATORY_TRACT
  Filled 2022-07-01 (×2): qty 2

## 2022-07-01 MED ORDER — IPRATROPIUM-ALBUTEROL 0.5-2.5 (3) MG/3ML IN SOLN
3.0000 mL | Freq: Four times a day (QID) | RESPIRATORY_TRACT | Status: DC
  Administered 2022-07-01 (×2): 3 mL via RESPIRATORY_TRACT
  Filled 2022-07-01 (×2): qty 3

## 2022-07-01 MED ORDER — SODIUM CHLORIDE 0.9 % IV BOLUS (SEPSIS)
100.0000 mL | Freq: Once | INTRAVENOUS | Status: AC
  Administered 2022-07-01: 100 mL via INTRAVENOUS

## 2022-07-01 MED ORDER — SENNOSIDES-DOCUSATE SODIUM 8.6-50 MG PO TABS: 1.0000 | ORAL_TABLET | Freq: Every evening | ORAL | Status: DC | PRN

## 2022-07-01 MED ORDER — SODIUM CHLORIDE 0.9 % IV BOLUS
1000.0000 mL | Freq: Once | INTRAVENOUS | Status: AC
  Administered 2022-07-01: 1000 mL via INTRAVENOUS

## 2022-07-01 MED ORDER — VANCOMYCIN HCL IN DEXTROSE 1-5 GM/200ML-% IV SOLN: 1000.0000 mg | Freq: Once | INTRAVENOUS | Status: DC

## 2022-07-01 MED ORDER — SODIUM CHLORIDE 0.9 % IV SOLN
2.0000 g | Freq: Once | INTRAVENOUS | Status: AC
  Administered 2022-07-01: 2 g via INTRAVENOUS
  Filled 2022-07-01: qty 12.5

## 2022-07-01 MED ORDER — RIVAROXABAN 15 MG PO TABS
15.0000 mg | ORAL_TABLET | Freq: Two times a day (BID) | ORAL | Status: DC
  Administered 2022-07-01 – 2022-07-02 (×2): 15 mg via ORAL
  Filled 2022-07-01 (×2): qty 1

## 2022-07-01 MED ORDER — VANCOMYCIN HCL 2000 MG/400ML IV SOLN
2000.0000 mg | Freq: Once | INTRAVENOUS | Status: AC
  Administered 2022-07-01: 2000 mg via INTRAVENOUS
  Filled 2022-07-01: qty 400

## 2022-07-01 MED ORDER — ALLOPURINOL 100 MG PO TABS
100.0000 mg | ORAL_TABLET | Freq: Two times a day (BID) | ORAL | Status: DC
  Administered 2022-07-01 – 2022-07-05 (×8): 100 mg via ORAL
  Filled 2022-07-01 (×8): qty 1

## 2022-07-01 MED ORDER — METRONIDAZOLE 500 MG/100ML IV SOLN
500.0000 mg | Freq: Once | INTRAVENOUS | Status: AC
  Administered 2022-07-01: 500 mg via INTRAVENOUS
  Filled 2022-07-01: qty 100

## 2022-07-01 MED ORDER — INFLUENZA VAC A&B SA ADJ QUAD 0.5 ML IM PRSY
0.5000 mL | PREFILLED_SYRINGE | INTRAMUSCULAR | Status: AC
  Administered 2022-07-05: 0.5 mL via INTRAMUSCULAR
  Filled 2022-07-01 (×2): qty 0.5

## 2022-07-01 MED ORDER — SODIUM CHLORIDE 0.9 % IV SOLN
2.0000 g | INTRAVENOUS | Status: DC
  Administered 2022-07-01 – 2022-07-04 (×4): 2 g via INTRAVENOUS
  Filled 2022-07-01 (×5): qty 20

## 2022-07-01 MED ORDER — ACETAMINOPHEN 325 MG PO TABS
650.0000 mg | ORAL_TABLET | Freq: Once | ORAL | Status: AC
  Administered 2022-07-01: 650 mg via ORAL
  Filled 2022-07-01: qty 2

## 2022-07-01 MED ORDER — LABETALOL HCL 5 MG/ML IV SOLN: 10.0000 mg | INTRAVENOUS | Status: DC | PRN

## 2022-07-01 MED ORDER — SACCHAROMYCES BOULARDII 250 MG PO CAPS
250.0000 mg | ORAL_CAPSULE | Freq: Two times a day (BID) | ORAL | Status: DC
  Administered 2022-07-01 – 2022-07-05 (×8): 250 mg via ORAL
  Filled 2022-07-01 (×9): qty 1

## 2022-07-01 MED ORDER — ONDANSETRON HCL 4 MG/2ML IJ SOLN: 4.0000 mg | Freq: Four times a day (QID) | INTRAMUSCULAR | Status: DC | PRN

## 2022-07-01 MED ORDER — HYDROMORPHONE HCL 1 MG/ML IJ SOLN: 0.5000 mg | INTRAMUSCULAR | Status: DC | PRN

## 2022-07-01 MED ORDER — IOHEXOL 350 MG/ML SOLN
75.0000 mL | Freq: Once | INTRAVENOUS | Status: AC | PRN
  Administered 2022-07-01: 75 mL via INTRAVENOUS

## 2022-07-01 NOTE — Sepsis Progress Note (Signed)
Sepsis protocol monitored by eLink 

## 2022-07-01 NOTE — ED Notes (Signed)
Sitter at bedside.

## 2022-07-01 NOTE — ED Provider Notes (Signed)
Assumption of Care   ED Course / MDM   I assumed care of [name] on 07/01/2022 at 3 PM from the offgoing PA, Rex Kras.  Briefly, [name] is a 76 y.o. male who presented as above. The signout from the previous ED provider included: Clinical Course as of 07/01/22 1836  Thu Jul 01, 2022  1551 CTAP  1. No acute cardiopulmonary findings. 2. No hepatic abscess. 3. Perinephric renal stranding on the RIGHT. Findings could indicate sequela passed RIGHT ureteral calculus. No ureteral calculus or obstructive uropathy currently. 4. Bilateral nephrolithiasis. 5. Mild pan diverticulosis without diverticulitis. 6. Penile prosthetic reservoir or extends into a LEFT inguinal hernia. [DG]  1448 CXR An 8 mm nodular opacity projects at the level of the left lung base. This may reflect a pulmonary nodule or an asymmetric nipple shadow. A repeat AP chest radiograph with nipple markers is recommended. If this finding does not correspond with the left-sided nipple marker on the follow-up radiograph, a chest CT is recommended for further evaluation. [DG]  1552 CODE SEPSIS. Cr 2.07, up from baseline around 1.1. Lactic 2.1 -> 2.0. No pressors. Hospitalist consulted.  [DG]    Clinical Course User Index [DG] Renard Matter, MD     Please refer to the original provider's note for additional information regarding the care of [name].  Reassessment: Vital Signs:  The most current vitals were  Vitals:   07/01/22 1800 07/01/22 1815  BP: (!) 103/59 100/64  Pulse: 85 83  Resp: (!) 22 (!) 23  Temp:    SpO2: 97% 97%    Physical Exam:  Hemodynamics:  The patient is hemodynamically stable. Mental Status:  The patient is alert. PE: R sided flank tenderness.   Additional MDM: [name] is a 76 y.o. male here with concern for sepsis likely secondary to right pyelonephritis.   Patient admitted to hospitalist for further management.  Repeat blood pressures have been stable off of  pressors.  On my exam, initial blood pressure 110/60s.   The plan for this patient was discussed with Dr. Tamera Punt, who voiced agreement and who oversaw evaluation and treatment of this patient.    Diagnosis: No diagnosis found.             Renard Matter, MD 07/01/22 Hazle Nordmann    Malvin Johns, MD 07/01/22 2112

## 2022-07-01 NOTE — ED Triage Notes (Signed)
Patient brought in by ems.  Recently started on blood thinners for DVT but 3 days ago started having right flank pain with blood in urine and fever.  Patient extremely weak in triage.

## 2022-07-01 NOTE — H&P (Signed)
History and Physical    [name] PJA:[id number] DOB: Aug 20, 1946 DOA: 07/01/2022  PCP: Haydee Salter, MD (Confirm with patient/family/NH records and if not entered, this has to be entered at [facility] point of entry) Patient coming from: Home  I have personally briefly reviewed patient's old medical records in [place]  Chief Complaint: Fever, back pain  HPI: [name] is a 76 y.o. male with medical history significant of DVT on Xarelto, history of bladder cancer and prostate cancer status post TURP and radiation therapy, HTN, hypothyroidism, kidney stones, presented with new onset of hematuria, back pain and fever.  Patient was diagnosed with DVT and started on Xarelto about 3 weeks ago.  Since then, patient has observed episodes of hematuria, nonpainful.  About 3 days ago, he started develop another episode of hematuria, but this time, he also started to have severe right sided back pain, aching-like, constant, he denies any dysuria.  Soon, he started to have subjective fever and chills.  He also admitted that for last 2 to 3 months, he has had worsening of exertional dyspnea, for which he visited cardiology, and work-up was done recently and was told his heart is normal.  He denies any history of asthma or COPD, and he is a non-smoker. ED Course: Fever 101.7, hypotensive, responded to 5 L of fluid.  CT chest abdomen pel with showed multiple bilateral nonobstructive kidney stones, with right kidney stranding.  Diverticulosis but no signs of diverticulitis.  WBC 10.3, creatinine 2.0 compared to baseline 1.12 months ago, lactic acid 2.1> 2.0.  Patient was started on vancomycin cefepime and Flagyl in the ED.  Review of Systems: As per HPI otherwise 14 point review of systems negative.    Past Medical History:  Diagnosis Date   Arthritis    knees. back   Bladder cancer ([place])    2018   High cholesterol    Hypertension    Hypothyroidism    prostate cancer    2000, 2003   Thyroid  disease     Past Surgical History:  Procedure Laterality Date   EYE SURGERY Bilateral 2014   HERNIA REPAIR Right 1984   inguinal   IR RADIOLOGIST EVAL & MGMT  03/19/2021   KNEE ARTHROSCOPY Left 1966   LUMBAR LAMINECTOMY/DECOMPRESSION MICRODISCECTOMY N/A 05/13/2022   Procedure: Microlumbar laminectomy Lumbar three-four , Lumbar four-five;  Surgeon: Susa Day, MD;  Location: [place];  Service: Orthopedics;  Laterality: N/A;   PENILE PROSTHESIS IMPLANT  2014   TOTAL KNEE ARTHROPLASTY Right 08/11/2020   Procedure: TOTAL KNEE ARTHROPLASTY;  Surgeon: Gaynelle Arabian, MD;  Location: [facility];  Service: Orthopedics;  Laterality: Right;  55mn   TRANSURETHRAL RESECTION OF BLADDER TUMOR  2018     reports that he has never smoked. He has never been exposed to tobacco smoke. He has never used smokeless tobacco. He reports current alcohol use of about 2.0 standard drinks of alcohol per week. He reports that he does not currently use drugs after having used the following drugs: Marijuana.  No Known Allergies  Family History  Problem Relation Age of Onset   Cancer Neg Hx     Prior to Admission medications   Medication Sig Start Date End Date Taking? Authorizing Provider  allopurinol (ZYLOPRIM) 100 MG tablet TAKE 1 TABLET(100 MG) BY MOUTH TWICE DAILY Patient taking differently: Take 100 mg by mouth 2 (two) times daily. TAKE 1 TABLET(100 MG) BY MOUTH TWICE DAILY 05/30/22  Yes RHaydee Salter MD  carvedilol (  COREG) 25 MG tablet TAKE 1 TABLET(25 MG) BY MOUTH TWICE DAILY Patient taking differently: Take 25 mg by mouth 2 (two) times daily with a meal. TAKE 1 TABLET(25 MG) BY MOUTH TWICE DAILY 05/24/22  Yes Haydee Salter, MD  Coenzyme Q10 (COQ10 PO) Take 1 Dose by mouth daily. Liquid form   Yes [provider]  folic acid (FOLVITE) 287 MCG tablet Take 800 mcg by mouth daily.   Yes [provider]  levothyroxine (SYNTHROID) 88 MCG tablet TAKE 1 TABLET(88 MCG) BY MOUTH DAILY BEFORE  BREAKFAST Patient taking differently: Take 88 mcg by mouth daily before breakfast. 05/19/22  Yes Haydee Salter, MD  OVER THE COUNTER MEDICATION Take 2 capsules by mouth daily. Brain Supplement   Yes [provider]  OVER THE COUNTER MEDICATION Take 2 tablets by mouth daily. Flush ( for toenail fungus)   Yes [provider]  Prenatal Vit-Fe Fum-FA-Omega (VIVA DHA PO) Take 1 tablet by mouth daily.   Yes [provider]  Probiotic Product (PROBIOTIC GUMMIES PO) Take 1 tablet by mouth daily.   Yes [provider]  RIVAROXABAN Alveda Reasons) VTE STARTER PACK (15 & 20 MG) Follow package directions: Take one '15mg'$  tablet by mouth twice a day. On day 22, switch to one '20mg'$  tablet once a day. Take with food. 06/08/22  Yes Baglia, Corrina, PA-C  rosuvastatin (CRESTOR) 10 MG tablet TAKE 1 TABLET(10 MG) BY MOUTH DAILY Patient taking differently: Take 10 mg by mouth daily. TAKE 1 TABLET(10 MG) BY MOUTH DAILY 07/15/21  Yes Haydee Salter, MD  polyethylene glycol (MIRALAX / GLYCOLAX) 17 g packet Take 17 g by mouth daily. Patient not taking: Reported on 07/01/2022 05/13/22   Susa Day, MD    Physical Exam: Vitals:   07/01/22 1530 07/01/22 1540 07/01/22 1600 07/01/22 1630  BP: 116/64  (!) 120/102 (!) 140/107  Pulse: 71  88 93  Resp:      Temp:  98 F (36.7 C)    TempSrc:      SpO2: 98%  92% 99%  Weight:      Height:        Constitutional: NAD, calm, comfortable Vitals:   07/01/22 1530 07/01/22 1540 07/01/22 1600 07/01/22 1630  BP: 116/64  (!) 120/102 (!) 140/107  Pulse: 71  88 93  Resp:      Temp:  98 F (36.7 C)    TempSrc:      SpO2: 98%  92% 99%  Weight:      Height:       Eyes: PERRL, lids and conjunctivae normal ENMT: Mucous membranes are moist. Posterior pharynx clear of any exudate or lesions.Normal dentition.  Neck: normal, supple, no masses, no thyromegaly Respiratory: clear to auscultation bilaterally, scattered wheezing bilaterally, no crackles.  Normal respiratory effort. No accessory muscle use.  Cardiovascular: Regular rate and rhythm, no murmurs / rubs / gallops. No extremity edema. 2+ pedal pulses. No carotid bruits.  Abdomen: no tenderness, no masses palpated. No hepatosplenomegaly. Bowel sounds positive. CVA tenderness on the right Musculoskeletal: no clubbing / cyanosis. No joint deformity upper and lower extremities. Good ROM, no contractures. Normal muscle tone.  Skin: no rashes, lesions, ulcers. No induration Neurologic: CN 2-12 grossly intact. Sensation intact, DTR normal. Strength 5/5 in all 4.  Psychiatric: Normal judgment and insight. Alert and oriented x 3. Normal mood.    Labs on Admission: I have personally reviewed following labs and imaging studies  CBC: Recent Labs  Lab 07/01/22 1029  WBC 10.3  NEUTROABS 9.2*  HGB 13.5  HCT 40.1  MCV 90.1  PLT 811   Basic Metabolic Panel: Recent Labs  Lab 07/01/22 1029  NA 136  K 4.3  CL 106  CO2 21*  GLUCOSE 107*  BUN 27*  CREATININE 2.07*  CALCIUM 9.8   GFR: Estimated Creatinine Clearance: 38.2 mL/min (A) (by C-G formula based on SCr of 2.07 mg/dL (H)). Liver Function Tests: Recent Labs  Lab 07/01/22 1029  AST 24  ALT 21  ALKPHOS 73  BILITOT 1.3*  PROT 6.5  ALBUMIN 2.9*   No results for input(s): "LIPASE", "AMYLASE" in the last 168 hours. No results for input(s): "AMMONIA" in the last 168 hours. Coagulation Profile: Recent Labs  Lab 07/01/22 1029  INR 2.7*   Cardiac Enzymes: No results for input(s): "CKTOTAL", "CKMB", "CKMBINDEX", "TROPONINI" in the last 168 hours. BNP (last 3 results) No results for input(s): "PROBNP" in the last 8760 hours. HbA1C: No results for input(s): "HGBA1C" in the last 72 hours. CBG: No results for input(s): "GLUCAP" in the last 168 hours. Lipid Profile: No results for input(s): "CHOL", "HDL", "LDLCALC", "TRIG", "CHOLHDL", "LDLDIRECT" in the last 72 hours. Thyroid Function Tests: No results for input(s): "TSH",  "T4TOTAL", "FREET4", "T3FREE", "THYROIDAB" in the last 72 hours. Anemia Panel: No results for input(s): "VITAMINB12", "FOLATE", "FERRITIN", "TIBC", "IRON", "RETICCTPCT" in the last 72 hours. Urine analysis:    Component Value Date/Time   COLORURINE AMBER (A) 07/01/2022 1438   APPEARANCEUR CLOUDY (A) 07/01/2022 1438   LABSPEC 1.016 07/01/2022 1438   PHURINE 5.0 07/01/2022 1438   GLUCOSEU NEGATIVE 07/01/2022 1438   HGBUR LARGE (A) 07/01/2022 1438   BILIRUBINUR NEGATIVE 07/01/2022 1438   BILIRUBINUR neg 01/16/2021 1657   KETONESUR NEGATIVE 07/01/2022 1438   PROTEINUR 100 (A) 07/01/2022 1438   UROBILINOGEN 2.0 (A) 01/16/2021 1657   NITRITE NEGATIVE 07/01/2022 1438   LEUKOCYTESUR MODERATE (A) 07/01/2022 1438    Radiological Exams on Admission: CT CHEST ABDOMEN PELVIS W CONTRAST  Result Date: 07/01/2022 CLINICAL DATA:  Hypotension and fever.  Recent hepatic abscess. EXAM: CT CHEST, ABDOMEN, AND PELVIS WITH CONTRAST TECHNIQUE: Multidetector CT imaging of the chest, abdomen and pelvis was performed following the standard protocol during bolus administration of intravenous contrast. RADIATION DOSE REDUCTION: This exam was performed according to the departmental dose-optimization program which includes automated exposure control, adjustment of the mA and/or kV according to patient size and/or use of iterative reconstruction technique. CONTRAST:  85m OMNIPAQUE IOHEXOL 350 MG/ML SOLN COMPARISON:  None Available. FINDINGS: CT CHEST FINDINGS Cardiovascular: Coronary artery calcification and aortic atherosclerotic calcification. Mediastinum/Nodes: No axillary or supraclavicular adenopathy. No mediastinal or hilar adenopathy. No pericardial fluid. Esophagus normal. Lungs/Pleura: No pulmonary infarction. No pneumonia. No pleural fluid. No pneumothorax Musculoskeletal: No aggressive osseous lesion. CT ABDOMEN AND PELVIS FINDINGS Hepatobiliary: No hepatic abscess. No enhancing hepatic lesion. Gallbladder  normal. Normal biliary tree. Pancreas: Pancreas is normal. No ductal dilatation. No pancreatic inflammation. Spleen: Normal spleen Adrenals/urinary tract: Adrenal glands normal. Bilateral punctate nonobstructing renal calculi. No ureterolithiasis or obstructive uropathy. No bladder calculi. Asymmetric perinephric stranding on the RIGHT. Stomach/Bowel: Stomach, small bowel, appendix, and cecum are normal. Scattered diverticula throughout the colon. Rectum normal. Vascular/Lymphatic: Abdominal aorta is normal caliber. There is no retroperitoneal or periportal lymphadenopathy. No pelvic lymphadenopathy. Reproductive: Penile prosthetic reservoir extends into a LEFT inguinal hernia. Other: No free fluid. Musculoskeletal: No aggressive osseous lesion Degenerative osteophytosis of the spine. IMPRESSION: 1. No acute cardiopulmonary findings. 2. No hepatic abscess. 3. Perinephric  renal stranding on the RIGHT. Findings could indicate sequela passed RIGHT ureteral calculus. No ureteral calculus or obstructive uropathy currently. 4. Bilateral nephrolithiasis. 5. Mild pan diverticulosis without diverticulitis. 6. Penile prosthetic reservoir or extends into a LEFT inguinal hernia. Electronically Signed   By: Suzy Bouchard M.D.   On: 07/01/2022 14:59   DG Chest Portable 1 View  Result Date: 07/01/2022 CLINICAL DATA:  Provided history: Fever. Shortness of breath. Code sepsis. EXAM: PORTABLE CHEST 1 VIEW COMPARISON:  Prior chest radiographs 03/04/2021 and earlier. Heart size FINDINGS: Heart size at the upper limits of normal. Aortic atherosclerosis. 8 mm nodular opacity projecting at the level of the left lung base (see annotation on image). Elsewhere, there is no appreciable airspace consolidation. No pulmonary edema. No evidence of pleural effusion or pneumothorax. Degenerative changes of the spine and right glenohumeral joint. IMPRESSION: An 8 mm nodular opacity projects at the level of the left lung base. This may reflect  a pulmonary nodule or an asymmetric nipple shadow. A repeat AP chest radiograph with nipple markers is recommended. If this finding does not correspond with the left-sided nipple marker on the follow-up radiograph, a chest CT is recommended for further evaluation. Otherwise, no evidence of acute cardiopulmonary abnormality. Aortic Atherosclerosis (ICD10-I70.0). Electronically Signed   By: Kellie Simmering D.O.   On: 07/01/2022 11:01    EKG: Independently reviewed. Sinus, no ST changes  Assessment/Plan Principal Problem:   Sepsis ([place])  (please populate well all problems here in Problem List. (For example, if patient is on BP meds at home and you resume or decide to hold them, it is a problem that needs to be her. Same for CAD, COPD, HLD and so on)  Severe sepsis -Evidenced by fever, hypotension, with signs of endorgan damage of AKI, likely source is a complicated UTI with hematuria/right-sided pyelonephritis. -De-escalate antibiotic coverage to ceftriaxone -Blood pressure appears to respond IV boluses, hold off home BP meds, continue maintenance IV fluid. -Check PVR  Complicated UTI with hematuria and concurrent right-sided pyelonephritis -As above.  Question of asthma -Has active wheezing, and exertional dyspnea for last 3 months.  Cardiology recent appears to be ruled out with recent cardiac study.  Pulmonary wise, no smoking history, has been working as a Glass blower/designer for years.  Given that there is active wheezing, start patient on DuoNebs, trial of Pulmicort twice daily.  Recommend outpatient follow-up with pulmonary for formal PFT. -Check VBG to rule out CO2 retention.  No history of GERD, and denies any reflux symptoms or cough in the morning.  AKI -Likely secondary to sepsis, likely prerenal -Management as above.  HTN -BP meds on hold.  DVT -Continue Xarelto  Gout -Continue allopurinol  DVT prophylaxis: Xarelto Code Status: Full code Family Communication: None at  bedside Disposition Plan: Patient is sick with sepsis, requiring IV antibiotics, expect more than 2 midnight hospital stay. Consults called: None Admission status: PCU   Lequita Halt MD [facility] [phone]  07/01/2022, 5:21 PM

## 2022-07-01 NOTE — Progress Notes (Signed)
Pharmacy Antibiotic Note  [name] is a 76 y.o. male admitted on 07/01/2022 with sepsis.  Pharmacy has been consulted for Cefepime and Vancomycin dosing.  Plan: Cefepime 2g IV q12h Initiate loading dose of Vancomycin '2000mg'$  IV x 1, followed by  Vancomycin '1500mg'$  IV q48h (eAUC ~441)    > Goal AUC 400-550    > Check vancomycin levels at steady state  Monitor daily CBC, temp, SCr, and for clinical signs of improvement  F/u cultures and de-escalate antibiotics as able   Height: 6' (182.9 cm) Weight: 106.1 kg (234 lb) IBW/kg (Calculated) : 77.6  Temp (24hrs), Avg:101.7 F (38.7 C), Min:101.7 F (38.7 C), Max:101.7 F (38.7 C)  Recent Labs  Lab 07/01/22 1029  WBC 10.3    CrCl cannot be calculated (Patient's most recent lab result is older than the maximum 21 days allowed.).    No Known Allergies  Antimicrobials this admission: Cefepime 11/2 >>  Metronidazole 11/2 >>  Vancomycin 11/2 >>   Dose adjustments this admission: N/A  Microbiology results: 11/2 BCx: pending 11/2 UCx: pending   Thank you for allowing pharmacy to be a part of this patient's care.  Luisa Hart, PharmD, BCPS Clinical Pharmacist 07/01/2022 2:15 PM   Please refer to AMION for pharmacy phone number

## 2022-07-01 NOTE — ED Notes (Signed)
Called into room for pt feeling extremely SOB suddenly.  Placed on 2L to assist with easy of breathing.  Will let MD know and administer breathing treatment

## 2022-07-01 NOTE — ED Notes (Signed)
Pt c/o decreased energy intermittent x 1-2 months, persistent x 1 week; fever x 1 day, and generalized weakness x 1 day.

## 2022-07-01 NOTE — ED Provider Notes (Signed)
[facility] EMERGENCY DEPARTMENT Provider Note   CSN: [id number] Arrival date & time: 07/01/22  1020     History  Chief Complaint  Patient presents with   Code Sepsis    [name] is a 76 y.o. male history of DVT, hypothyroidism, spinal stenosis status post microlumbar laminectomy 9/14 presenting to the emergency department for evaluation of weakness.  Patient states he has had lower right back pain in last few days.  The pain is sharp, intermittent, nonradiating.  He noticed blood in his urine and had a fever yesterday.  Wife states patient has been feeling weak recently.  He has nausea and vomited once this morning.  Denies any chest pain, shortness of breath, pain, leg pain, rash.  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 Authorizing Provider  allopurinol (ZYLOPRIM) 100 MG tablet TAKE 1 TABLET(100 MG) BY MOUTH TWICE DAILY 05/30/22   Haydee Salter, MD  carvedilol (COREG) 25 MG tablet TAKE 1 TABLET(25 MG) BY MOUTH TWICE DAILY 05/24/22   Haydee Salter, MD  Coenzyme Q10 (COQ10 PO) Take 1 Dose by mouth daily. Liquid form    [provider]  folic acid (FOLVITE) 338 MCG tablet Take 800 mcg by mouth daily.    [provider]  levothyroxine (SYNTHROID) 88 MCG tablet TAKE 1 TABLET(88 MCG) BY MOUTH DAILY BEFORE BREAKFAST 05/19/22   Haydee Salter, MD  OVER THE COUNTER MEDICATION Take 2 capsules by mouth daily. Brain Supplement    [provider]  OVER THE COUNTER MEDICATION Take 2 tablets by mouth daily. Flush ( for toenail fungus)    [provider]  polyethylene glycol (MIRALAX / GLYCOLAX) 17 g packet Take 17 g by mouth daily. 05/13/22   Susa Day, MD  RIVAROXABAN Alveda Reasons) VTE STARTER PACK (15 & 20 MG) Follow package directions: Take one '15mg'$  tablet by mouth twice a day. On day 22, switch to one '20mg'$  tablet once a day. Take with  food. 06/08/22   Baglia, Corrina, PA-C  rosuvastatin (CRESTOR) 10 MG tablet TAKE 1 TABLET(10 MG) BY MOUTH DAILY 07/15/21   Haydee Salter, MD      Allergies    Patient has no known allergies.    Review of Systems   Review of Systems  Physical Exam Updated Vital Signs BP (!) 72/52 (BP Location: Right Arm)   Pulse 73   Temp (!) 101.7 F (38.7 C)   Resp 20   Ht 6' (1.829 m)   Wt 106.1 kg   SpO2 97%   BMI 31.74 kg/m  Physical Exam Vitals and nursing note reviewed.  Constitutional:      Appearance: Normal appearance. He is ill-appearing.  HENT:     Head: Normocephalic and atraumatic.     Mouth/Throat:     Mouth: Mucous membranes are moist.  Eyes:     General: No scleral icterus. Cardiovascular:     Rate and Rhythm: Normal rate and regular rhythm.     Pulses: Normal pulses.     Heart sounds: Normal heart sounds.  Pulmonary:     Effort: Pulmonary effort is normal.     Breath sounds: Normal breath sounds.  Abdominal:     General: Abdomen is flat.     Palpations: Abdomen is soft.     Tenderness: There is no abdominal tenderness.  Musculoskeletal:  General: No deformity.     Comments: TTP to lower back and right flank.  Skin:    General: Skin is warm.     Findings: No rash.  Neurological:     General: No focal deficit present.     Mental Status: He is alert.  Psychiatric:        Mood and Affect: Mood normal.     ED Results / Procedures / Treatments   Labs (all labs ordered are listed, but only abnormal results are displayed) Labs Reviewed  CBC WITH DIFFERENTIAL/PLATELET - Abnormal; Notable for the following components:      Result Value   Neutro Abs 9.2 (*)    Lymphs Abs 0.4 (*)    Abs Immature Granulocytes 0.08 (*)    All other components within normal limits  CULTURE, BLOOD (ROUTINE X 2)  CULTURE, BLOOD (ROUTINE X 2)  URINE CULTURE  COMPREHENSIVE METABOLIC PANEL  LACTIC ACID, PLASMA  LACTIC ACID, PLASMA  PROTIME-INR  URINALYSIS, ROUTINE W  REFLEX MICROSCOPIC    EKG None  Radiology No results found.  Procedures Procedures    Medications Ordered in ED Medications  ceFEPIme (MAXIPIME) 2 g in sodium chloride 0.9 % 100 mL IVPB (has no administration in time range)  metroNIDAZOLE (FLAGYL) IVPB 500 mg (has no administration in time range)  vancomycin (VANCOCIN) IVPB 1000 mg/200 mL premix (has no administration in time range)  acetaminophen (TYLENOL) tablet 650 mg (has no administration in time range)    ED Course/ Medical Decision Making/ A&P Clinical Course as of 07/02/22 0840  Thu Jul 01, 2022  1551 CTAP  1. No acute cardiopulmonary findings. 2. No hepatic abscess. 3. Perinephric renal stranding on the RIGHT. Findings could indicate sequela passed RIGHT ureteral calculus. No ureteral calculus or obstructive uropathy currently. 4. Bilateral nephrolithiasis. 5. Mild pan diverticulosis without diverticulitis. 6. Penile prosthetic reservoir or extends into a LEFT inguinal hernia. [DG]  2130 CXR An 8 mm nodular opacity projects at the level of the left lung base. This may reflect a pulmonary nodule or an asymmetric nipple shadow. A repeat AP chest radiograph with nipple markers is recommended. If this finding does not correspond with the left-sided nipple marker on the follow-up radiograph, a chest CT is recommended for further evaluation. [DG]  1552 CODE SEPSIS. Cr 2.07, up from baseline around 1.1. Lactic 2.1 -> 2.0. No pressors. Hospitalist consulted.  [DG]    Clinical Course User Index [DG] Renard Matter, MD                           Medical Decision Making Amount and/or Complexity of Data Reviewed Labs: ordered. Radiology: ordered.  Risk OTC drugs. Prescription drug management. Decision regarding hospitalization.   This patient presents to the ED for concern of sepsis, this involves an extensive number of treatment options, and is a complaint that carries with a high risk of complications and  morbidity.  The differential diagnosis includes sepsis, UTI, kidney stone.  This is not an exhaustive list.  Comorbidities that complicate the patient evaluation See HPI  Social determinants of health NA  Additional history obtained: Additional history obtained from EMR. External records from outside source obtained and review including prior labs  Cardiac monitoring/EKG: The patient was maintained on a cardiac monitor.  I personally reviewed and interpreted the cardiac monitor which showed an underlying rhythm of: Sinus rhythm.  Lab tests: I ordered and personally interpreted labs.  The pertinent results include  WBC normal. Hbg/Hct normal. Platelets normal. No electrolyte abnormalities noted. BUN, creatinine normal. No transaminitis. UA significant red blood cells, leukocytes and bacteria. Imaging studies: I ordered imaging studies including CT chest abdomen pelvis with contrast which showed: 1. No acute cardiopulmonary findings. 2. No hepatic abscess. 3. Perinephric renal stranding on the RIGHT. Findings could indicate sequela passed RIGHT ureteral calculus. No ureteral calculus or obstructive uropathy currently. 4. Bilateral nephrolithiasis. 5. Mild pan diverticulosis without diverticulitis. 6. Penile prosthetic reservoir or extends into a LEFT inguinal hernia.. I personally reviewed, interpreted imaging and agree with the radiologist's interpretations.  Problem list/ ED course/ Critical interventions/ Medical management: HPI: See above Vital signs blood pressure 103/59, respiratory rate 28. Otherwise normal range and stable throughout visit. Laboratory/imaging studies significant for: See above. On physical examination, patient is afebrile and ill appearing.  Initial blood pressure in triage was 72/52.  Temperature 101.7.  Code sepsis activated.  1 L bolus fluid ordered.  Antibiotics ordered.  1:02 PM Reported by nurse patient's blood pressure has dropped to the 90s.  Used 30 cc/kg fluid  protocol I ordered 2100 cc of fluid. Continued fluid resuscitation. Central line considered however a series of MAP measured around 1500 were above 65. Central line held off. 3:00 PM Spoke to the hospitalist Dr. Roosevelt Locks who will come to see patient.   Patient's clinical presentations and laboratory/imaging studies are most concerned for sepsis possibly from UTI.  Broad spectrum antibiotics ordered. I have reviewed the patient home medicines and have made adjustments as needed.  Consultations obtained: I requested consultation with the hospitalist Dr. Roosevelt Locks, and discussed lab and imaging findings as well as pertinent plan.  They recommend admission  Disposition Patient is admitted to the hospital for sepsis.  This chart was dictated using voice recognition software.  Despite best efforts to proofread,  errors can occur which can change the documentation meaning.          Final Clinical Impression(s) / ED Diagnoses Final diagnoses:  None    Rx / DC Orders ED Discharge Orders     None         Rex Kras, [place] 69/48/54 [id number]    Lianne Cure, DO 35/00/93 [phone]

## 2022-07-01 NOTE — Progress Notes (Signed)
ANTICOAGULATION CONSULT NOTE - Initial Consult  Pharmacy Consult for Xarelto Indication: DVT  No Known Allergies  Patient Measurements: Height: 6' (182.9 cm) Weight: 106.1 kg (234 lb) IBW/kg (Calculated) : 77.6  Vital Signs: Temp: 98 F (36.7 C) (11/02 1540) Temp Source: Oral (11/02 1354) BP: 140/107 (11/02 1630) Pulse Rate: 93 (11/02 1630)  Labs: Recent Labs    07/01/22 1029  HGB 13.5  HCT 40.1  PLT 152  LABPROT 28.7*  INR 2.7*  CREATININE 2.07*    Estimated Creatinine Clearance: 38.2 mL/min (A) (by C-G formula based on SCr of 2.07 mg/dL (H)).   Medical History: Past Medical History:  Diagnosis Date   Arthritis    knees. 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 Last dose was today, 11/2, at 0800 and is on day 18/21 of 15 mg BID. Patient will need to switch to 20 mg daily on 07/05/2022. Pharmacy has been consulted to restart PTA Xarelto.   On presentation, patient reports history of several episodes of hematuria, the most recent episode being approx. 3 days ago accompanied by severe right-sided back pain and fever. In ED, patient febrile to 101.7, WBC 10.3, CT scan shows multiple bilateral nonobstructive kidney stones. Hgb is 13.5, platelets are WNL.   Plan:  Restart Xarelto 15 mg BID for 7 more doses Switch to Xarelto 20 mg daily on 07/05/2022 Monitor CBC, signs and symptoms of bleeding  Louanne Belton, PharmD, [facility] PGY1 Pharmacy  Resident 07/01/2022 5:13 PM

## 2022-07-02 DIAGNOSIS — N390 Urinary tract infection, site not specified: Secondary | ICD-10-CM | POA: Diagnosis not present

## 2022-07-02 DIAGNOSIS — A419 Sepsis, unspecified organism: Secondary | ICD-10-CM | POA: Diagnosis not present

## 2022-07-02 LAB — BLOOD CULTURE ID PANEL (REFLEXED) - BCID2

## 2022-07-02 LAB — CBC
HCT: 36.3 % — ABNORMAL LOW (ref 39.0–52.0)
Hemoglobin: 11.8 g/dL — ABNORMAL LOW (ref 13.0–17.0)
MCH: 30.6 pg (ref 26.0–34.0)
MCHC: 32.5 g/dL (ref 30.0–36.0)
MCV: 94.3 fL (ref 80.0–100.0)
Platelets: 101 10*3/uL — ABNORMAL LOW (ref 150–400)
RBC: 3.85 MIL/uL — ABNORMAL LOW (ref 4.22–5.81)
RDW: 15.5 % (ref 11.5–15.5)
WBC: 9.2 10*3/uL (ref 4.0–10.5)
nRBC: 0 % (ref 0.0–0.2)

## 2022-07-02 LAB — BASIC METABOLIC PANEL
Anion gap: 11 (ref 5–15)
BUN: 29 mg/dL — ABNORMAL HIGH (ref 8–23)
CO2: 17 mmol/L — ABNORMAL LOW (ref 22–32)
Calcium: 8.7 mg/dL — ABNORMAL LOW (ref 8.9–10.3)
Chloride: 106 mmol/L (ref 98–111)
Creatinine, Ser: 1.98 mg/dL — ABNORMAL HIGH (ref 0.61–1.24)
GFR, Estimated: 34 mL/min — ABNORMAL LOW (ref >60)
Glucose, Bld: 80 mg/dL (ref 70–99)
Potassium: 4.2 mmol/L (ref 3.5–5.1)
Sodium: 134 mmol/L — ABNORMAL LOW (ref 135–145)

## 2022-07-02 LAB — PROCALCITONIN: Procalcitonin: 30.64 ng/mL

## 2022-07-02 MED ORDER — IPRATROPIUM-ALBUTEROL 0.5-2.5 (3) MG/3ML IN SOLN
3.0000 mL | Freq: Four times a day (QID) | RESPIRATORY_TRACT | Status: DC | PRN
  Administered 2022-07-02 – 2022-07-03 (×3): 3 mL via RESPIRATORY_TRACT
  Filled 2022-07-02 (×3): qty 3

## 2022-07-02 MED ORDER — CARVEDILOL 25 MG PO TABS
25.0000 mg | ORAL_TABLET | Freq: Two times a day (BID) | ORAL | Status: DC
  Administered 2022-07-02 – 2022-07-05 (×6): 25 mg via ORAL
  Filled 2022-07-02 (×6): qty 1

## 2022-07-02 MED ORDER — SODIUM CHLORIDE 0.9 % IV SOLN: INTRAVENOUS | Status: DC

## 2022-07-02 MED ORDER — RIVAROXABAN 20 MG PO TABS
20.0000 mg | ORAL_TABLET | Freq: Every day | ORAL | Status: DC
  Administered 2022-07-02 – 2022-07-04 (×3): 20 mg via ORAL
  Filled 2022-07-02 (×3): qty 1

## 2022-07-02 MED ORDER — RIVAROXABAN 20 MG PO TABS: 20.0000 mg | ORAL_TABLET | Freq: Every day | ORAL | Status: DC

## 2022-07-02 NOTE — Plan of Care (Signed)

## 2022-07-02 NOTE — Progress Notes (Signed)
PHARMACY - PHYSICIAN COMMUNICATION CRITICAL VALUE ALERT - BLOOD CULTURE IDENTIFICATION (BCID)  [name] is an 76 y.o. male who presented to [facility] on 07/01/2022 with a chief complaint of fever/back pain  Name of physician (or Provider) Contacted: Dr. Nevada Crane  Current antibiotics: Ceftriaxone 2g IV q24h  Changes to prescribed antibiotics recommended:  Patient is on recommended antibiotics - No changes needed  Results for orders placed or performed during the hospital encounter of 07/01/22  Blood Culture ID Panel (Reflexed) (Collected: 07/01/2022 10:39 AM)  Result Value Ref Range   Enterococcus faecalis NOT DETECTED NOT DETECTED   Enterococcus Faecium NOT DETECTED NOT DETECTED   Listeria monocytogenes NOT DETECTED NOT DETECTED   Staphylococcus species NOT DETECTED NOT DETECTED   Staphylococcus aureus (BCID) NOT DETECTED NOT DETECTED   Staphylococcus epidermidis NOT DETECTED NOT DETECTED   Staphylococcus lugdunensis NOT DETECTED NOT DETECTED   Streptococcus species NOT DETECTED NOT DETECTED   Streptococcus agalactiae NOT DETECTED NOT DETECTED   Streptococcus pneumoniae NOT DETECTED NOT DETECTED   Streptococcus pyogenes NOT DETECTED NOT DETECTED   A.calcoaceticus-baumannii NOT DETECTED NOT DETECTED   Bacteroides fragilis NOT DETECTED NOT DETECTED   Enterobacterales DETECTED (A) NOT DETECTED   Enterobacter cloacae complex NOT DETECTED NOT DETECTED   Escherichia coli NOT DETECTED NOT DETECTED   Klebsiella aerogenes NOT DETECTED NOT DETECTED   Klebsiella oxytoca DETECTED (A) NOT DETECTED   Klebsiella pneumoniae NOT DETECTED NOT DETECTED   Proteus species NOT DETECTED NOT DETECTED   Salmonella species NOT DETECTED NOT DETECTED   Serratia marcescens NOT DETECTED NOT DETECTED   Haemophilus influenzae NOT DETECTED NOT DETECTED   Neisseria meningitidis NOT DETECTED NOT DETECTED   Pseudomonas aeruginosa NOT DETECTED NOT DETECTED   Stenotrophomonas maltophilia NOT DETECTED NOT DETECTED    Candida albicans NOT DETECTED NOT DETECTED   Candida auris NOT DETECTED NOT DETECTED   Candida glabrata NOT DETECTED NOT DETECTED   Candida krusei NOT DETECTED NOT DETECTED   Candida parapsilosis NOT DETECTED NOT DETECTED   Candida tropicalis NOT DETECTED NOT DETECTED   Cryptococcus neoformans/gattii NOT DETECTED NOT DETECTED   CTX-M ESBL NOT DETECTED NOT DETECTED   Carbapenem resistance IMP NOT DETECTED NOT DETECTED   Carbapenem resistance KPC NOT DETECTED NOT DETECTED   Carbapenem resistance NDM NOT DETECTED NOT DETECTED   Carbapenem resist OXA 48 LIKE NOT DETECTED NOT DETECTED   Carbapenem resistance VIM NOT DETECTED NOT DETECTED    Narda Bonds 07/02/2022  5:25 AM

## 2022-07-02 NOTE — Progress Notes (Signed)
Initial Nutrition Assessment  DOCUMENTATION CODES:   Not applicable  INTERVENTION:  - Liberalize to Regular diet - If intake remains poor recommend Ensure Enlive BID    NUTRITION DIAGNOSIS:   Inadequate oral intake related to decreased appetite as evidenced by per patient/family report.  GOAL:   Patient will meet greater than or equal to 90% of their needs  MONITOR:   PO intake  REASON FOR ASSESSMENT:   Malnutrition Screening Tool    ASSESSMENT:   76 y.o. male with PMH bladder cancer, HTN, Hypothyroidism, kidney stones who presented with fever and found to have sepsis.  Met with patient and wife at bedside this AM. Pt reports a UBW of 230# and denied recent changes in weight. Per chart review of previous weight history, no significant changes in weight. Pt endorses typically eating 2 meals a day (breakfast and dinner) with good appetite. Admits his current appetite is poor but that he tried to eat some of his breakfast this morning. Encouraged intake of small and frequent meals and snacks in between meals to stimulate appetite. Agreeable to nutrition supplements if appetite does not improve back to baseline.  Medications reviewed and include: Synthroid, Senokot prn   Labs reviewed:  Na 134 (L) Creatinine 1.98 (H) GFR 34 (L)   NUTRITION - FOCUSED PHYSICAL EXAM:  Flowsheet Row Most Recent Value  Orbital Region No depletion  Upper Arm Region No depletion  Thoracic and Lumbar Region No depletion  Buccal Region Mild depletion  Temple Region Mild depletion  Clavicle Bone Region No depletion  Clavicle and Acromion Bone Region No depletion  Scapular Bone Region Unable to assess  Dorsal Hand No depletion  Patellar Region No depletion  Anterior Thigh Region No depletion  Posterior Calf Region No depletion  Edema (RD Assessment) None  Hair Reviewed  Eyes Reviewed  Mouth Reviewed  Skin Reviewed  Nails Reviewed       Diet Order:   Diet Order             Diet  regular Room service appropriate? Yes; Fluid consistency: Thin  Diet effective now                   EDUCATION NEEDS:   No education needs have been identified at this time  Skin:  Skin Assessment: Reviewed RN Assessment  Last BM:  11/2  Height:  Ht Readings from Last 1 Encounters:  07/01/22 6' (1.829 m)    Weight:  Wt Readings from Last 1 Encounters:  07/01/22 106.1 kg    Ideal Body Weight:  80.9 kg   BMI:  Body mass index is 31.74 kg/m.  Estimated Nutritional Needs:  Kcal:  2000-2200 Protein:  100-125 Fluid:  2000-2200    Samson Frederic RD, LDN For contact information, refer to [facility].

## 2022-07-02 NOTE — Progress Notes (Signed)
PROGRESS NOTE  [name]  DOB: [birth year]  PCP: Haydee Salter, MD OHY:[id number]  DOA: 07/01/2022  LOS: 1 day  Hospital Day: 2  Brief narrative: [name] is a 76 y.o. male with PMH significant for HTN, DVT on Xarelto, history of bladder cancer, prostate cancer s/p TURP and radiation therapy, kidney stones, hypothyroidism  11/2, patient presented to the ED with complaint of new onset of hematuria, back pain and fever. 3 weeks prior, patient was started on Xarelto for DVT.  Since then patient has been having intermittent painless hematuria. About 3 days ago, he had an episode of hematuria and also associated severe rightsided back pain that  was followed by fever and chills.   Of note, for the last 2 to 3 months, patient was also seen by cardiology as an outpatient for exertional dyspnea and reportedly the workup was negative.  In the ED, patient had a temperature of 101.7, heart rate 70s, initially hypotensive to 70s, breathing on room air. Labs with unremarkable CBC, lactic acid 2.1, BUN/creatinine 27/2.07, INR 2.76, procalcitonin elevated to 30 urinalysis showed cloudy amber color urine with large hemoglobin, moderate leukocytes, few bacteria CT chest abd/pelvis showed multiple bilateral nonobstructive kidney stones, with right kidney stranding.  Diverticulosis but no signs of diverticulitis.  Urine culture and blood culture were sent. Patient was started on empiric IV antibiotics. Admitted to [facility] Blood culture sent on admission is growing Klebsiella oxytoca.  Subjective: Patient was seen and examined this morning.  Elderly Caucasian male.  Lying on bed.  Could not sleep well last night.  Looks tired this morning.  Wife at bedside. No blood noted in condom catheter today. Chart reviewed.   Overnight, heart rate in 80s, blood pressure running mostly low in 80s and 90s, breathing on 2.5 L oxygen by nasal cannula Labs this morning showing sodium 134, creatinine 1.98,    Assessment and plan: Severe sepsis - POA Complicated UTI with hematuria Right-sided pyonephritis Klebsiella bacteremia Presented with fever, back pain, hematuria  Urinalysis as above.  On empiric IV Rocephin Blood culture on admission is growing Klebsiella oxytoca.  Pending sensitivity WBC count normal.  No fever.  Repeat lactic acid level tomorrow. Recent Labs  Lab 07/01/22 1029 07/01/22 1245 07/01/22 1859 07/02/22 0234  WBC 10.3  --   --  9.2  LATICACIDVEN 2.1* 2.0*  --   --   PROCALCITON  --   --  30.50 30.64   Hematuria History of bladder/prostate cancer s/p TURP several years ago.  Follows up at [facility] urology. Hematuria likely from UTI.  Patient also has but patient states that he started having hematuria right after/started on Xarelto for DVT few weeks ago.  As per the protocol, he was on Xarelto 15 mg twice daily and is about to switch to 20 mg daily starting today.  I ordered for 20 mg daily. Hematuria seems to have resolved as of now.  Continue to monitor Hemoglobin 11.8 this morning. Recent Labs    07/17/21 0833 04/20/22 1417 07/01/22 1029 07/02/22 0234  HGB 14.5 15.3 13.5 11.8*  MCV 87.7 91.3 90.1 94.3   AKI Baseline creatinine less than 1.2.  Creatinine was elevated to 2.07 on admission, likely due to sepsis.  Improving with IV fluid. Recent Labs    07/17/21 0833 04/20/22 1417 07/01/22 1029 07/02/22 0234  BUN 22 26* 27* 29*  CREATININE 1.07 1.18 2.07* 1.98*   Dyspnea and exertion Complains of exertional dyspnea for last 3 months.   Reports negative  work-up as an outpatient. Echocardiogram was ordered on admission. Continue Pulmicort.   HTN PTA on Coreg 25 mg twice daily.  Continue the same  CAD Continue Coreg, statin.  Continue Xarelto  Hypothyroidism Synthroid daily   DVT Diagnosed with nonobstructive DVT on the right leg on 10/13.  Xarelto as above   Gout Continue allopurinol 100 mg twice daily    Goals of care   Code Status:  Full Code    Mobility: Encourage ambulation.  PT eval ordered  Skin assessment:     Nutritional status:  Body mass index is 31.74 kg/m.  Nutrition Problem: Inadequate oral intake Etiology: decreased appetite Signs/Symptoms: per patient/family report     Diet:  Diet Order             Diet regular Room service appropriate? Yes; Fluid consistency: Thin  Diet effective now                   DVT prophylaxis:   rivaroxaban (XARELTO) tablet 20 mg   Antimicrobials: IV Rocephin Fluid: NS at 19 mill per hour Consultants: None Family Communication: Wife at bedside  Status is: Inpatient  Continue inhospital care because: Ongoing management of sepsis Level of care: Progressive   Dispo: The patient is from: Home              Anticipated d/c is to: Hopefully home in 1 to 2 days              Patient currently is not medically stable to d/c.   Difficult to place patient No     Infusions:   sodium chloride     cefTRIAXone (ROCEPHIN)  IV Stopped (07/02/22 0000)    Scheduled Meds:  allopurinol  100 mg Oral BID   carvedilol  25 mg Oral BID WC   influenza vaccine adjuvanted  0.5 mL Intramuscular Tomorrow-1000   levothyroxine  88 mcg Oral Q0600   rivaroxaban  20 mg Oral Q supper   rosuvastatin  10 mg Oral Daily   saccharomyces boulardii  250 mg Oral BID    PRN meds: acetaminophen **OR** acetaminophen, HYDROmorphone (DILAUDID) injection, ipratropium-albuterol, labetalol, ondansetron **OR** ondansetron (ZOFRAN) IV, oxyCODONE, senna-docusate   Antimicrobials: Anti-infectives (From admission, onward)    Start     Dose/Rate Route Frequency Ordered Stop   07/03/22 1300  vancomycin (VANCOREADY) IVPB 1500 mg/300 mL  Status:  Discontinued        1,500 mg 150 mL/hr over 120 Minutes Intravenous Every 48 hours 07/01/22 1428 07/01/22 1631   07/01/22 2200  ceFEPIme (MAXIPIME) 2 g in sodium chloride 0.9 % 100 mL IVPB  Status:  Discontinued        2 g 200 mL/hr over 30 Minutes  Intravenous Every 12 hours 07/01/22 1428 07/01/22 1631   07/01/22 1730  cefTRIAXone (ROCEPHIN) 2 g in sodium chloride 0.9 % 100 mL IVPB        2 g 200 mL/hr over 30 Minutes Intravenous Every 24 hours 07/01/22 1631     07/01/22 1130  vancomycin (VANCOREADY) IVPB 2000 mg/400 mL        2,000 mg 200 mL/hr over 120 Minutes Intravenous  Once 07/01/22 1123 07/01/22 1539   07/01/22 1100  ceFEPIme (MAXIPIME) 2 g in sodium chloride 0.9 % 100 mL IVPB        2 g 200 mL/hr over 30 Minutes Intravenous  Once 07/01/22 1050 07/01/22 1140   07/01/22 1100  metroNIDAZOLE (FLAGYL) IVPB 500 mg  500 mg 100 mL/hr over 60 Minutes Intravenous  Once 07/01/22 1050 07/01/22 1250   07/01/22 1100  vancomycin (VANCOCIN) IVPB 1000 mg/200 mL premix  Status:  Discontinued        1,000 mg 200 mL/hr over 60 Minutes Intravenous  Once 07/01/22 1050 07/01/22 1123       Objective: Vitals:   07/02/22 0805 07/02/22 0944  BP: 104/66   Pulse: 87   Resp: 20   Temp:    SpO2: (!) 17% 94%    Intake/Output Summary (Last 24 hours) at 07/02/2022 1500 Last data filed at 07/02/2022 1100 Gross per 24 hour  Intake 1937.33 ml  Output 900 ml  Net 1037.33 ml   Filed Weights   07/01/22 1032  Weight: 106.1 kg   Weight change:  Body mass index is 31.74 kg/m.   Physical Exam: General exam: Pleasant, elderly Caucasian male.  Not in physical distress Skin: No rashes, lesions or ulcers. HEENT: Atraumatic, normocephalic, no obvious bleeding Lungs: Clear to auscultation bilaterally CVS: Regular rate and rhythm, no murmur GI/Abd soft, nontender, nondistended, bowel sound present CNS: Alert, awake, oriented x3 Psychiatry: Mood appropriate Extremities: Trace pedal edema on the right, no calf tenderness  Data Review: I have personally reviewed the laboratory data and studies available.  F/u labs ordered Unresulted Labs (From admission, onward)     Start     Ordered   07/03/22 0500  Lactic acid, plasma  Tomorrow morning,    R        07/02/22 1455   07/02/22 2482  Basic metabolic panel  Daily at 5am,   R      07/01/22 1428   07/02/22 0500  CBC  Daily at 5am,   R      07/01/22 1428   07/02/22 0500  Procalcitonin  Daily at 5am,   R      07/01/22 1642   07/01/22 1034  Urine Culture  Once,   URGENT       Question:  Indication  Answer:  Acute gross hematuria   07/01/22 1033            Signed, Terrilee Croak, MD [facility] 07/02/2022

## 2022-07-02 NOTE — Discharge Instructions (Signed)
Information on my medicine - XARELTO (rivaroxaban)  This medication education was reviewed with me or my healthcare representative as part of my discharge preparation.    You were taking this medication prior to this hospital admission.   WHY WAS XARELTO PRESCRIBED FOR YOU? Xarelto was prescribed to treat blood clots that may have been found in the veins of your legs (deep vein thrombosis) or in your lungs (pulmonary embolism) and to reduce the risk of them occurring again.  What do you need to know about Xarelto? The dose is one 20 mg tablet taken ONCE A DAY with your evening meal.  DO NOT stop taking Xarelto without talking to the health care provider who prescribed the medication.  Refill your prescription for 20 mg tablets before you run out.  After discharge, you should have regular check-up appointments with your healthcare provider that is prescribing your Xarelto.  In the future your dose may need to be changed if your kidney function changes by a significant amount.  What do you do if you miss a dose? If you are taking Xarelto TWICE DAILY and you miss a dose, take it as soon as you remember. You may take two 15 mg tablets (total 30 mg) at the same time then resume your regularly scheduled 15 mg twice daily the next day.  If you are taking Xarelto ONCE DAILY and you miss a dose, take it as soon as you remember on the same day then continue your regularly scheduled once daily regimen the next day. Do not take two doses of Xarelto at the same time.   Important Safety Information Xarelto is a blood thinner medicine that can cause bleeding. You should call your healthcare provider right away if you experience any of the following: Bleeding from an injury or your nose that does not stop. Unusual colored urine (red or dark brown) or unusual colored stools (red or black). Unusual bruising for unknown reasons. A serious fall or if you hit your head (even if there is no  bleeding).  Some medicines may interact with Xarelto and might increase your risk of bleeding while on Xarelto. To help avoid this, consult your healthcare provider or pharmacist prior to using any new prescription or non-prescription medications, including herbals, vitamins, non-steroidal anti-inflammatory drugs (NSAIDs) and supplements.  This website has more information on Xarelto: [email or web address]   Follow with Primary MD Haydee Salter, MD in 2 days   Get CBC, CMP, Magnesium -  checked next visit by Primary MD    Activity: As tolerated with Full fall precautions use walker/cane & assistance as needed  Disposition Home    Diet: Heart Healthy    Special Instructions: If you have smoked or chewed Tobacco  in the last 2 yrs please stop smoking, stop any regular Alcohol  and or any Recreational drug use.  On your next visit with your primary care physician please Get Medicines reviewed and adjusted.  Please request your Prim.MD to go over all Hospital Tests and Procedure/Radiological results at the follow up, please get all Hospital records sent to your Prim MD by signing hospital release before you go home.  If you experience worsening of your admission symptoms, develop shortness of breath, life threatening emergency, suicidal or homicidal thoughts you must seek medical attention immediately by calling 911 or calling your MD immediately  if symptoms less severe.  You Must read complete instructions/literature along with all the possible adverse reactions/side effects for all the Medicines you take and  that have been prescribed to you. Take any new Medicines after you have completely understood and accpet all the possible adverse reactions/side effects.

## 2022-07-02 NOTE — Plan of Care (Signed)

## 2022-07-02 NOTE — Progress Notes (Signed)
Mobility Specialist Progress Note:   07/02/22 1431  Mobility  Activity Dangled on edge of bed  Level of Assistance Moderate assist, patient does 50-74%  Assistive Device Other (Comment) (HHA)  Activity Response Tolerated poorly  Mobility Referral Yes  $Mobility charge 1 Mobility   BP Supine: 93/59 BP Sitting EOB: 109/68  Pt received in bed and agreeable. C/o of feeling weak and lightheaded, worsened with sitting upright. Pt required ModA to sit up in bed and MinA to stay upright. Pt left in bed with all needs met, call bell in reach, family in room, and bed alarm on.   Chassity Ludke Mobility Specialist-Acute Rehab Secure Chat only

## 2022-07-03 DIAGNOSIS — A4159 Other Gram-negative sepsis: Secondary | ICD-10-CM | POA: Diagnosis not present

## 2022-07-03 DIAGNOSIS — N12 Tubulo-interstitial nephritis, not specified as acute or chronic: Secondary | ICD-10-CM

## 2022-07-03 DIAGNOSIS — N179 Acute kidney failure, unspecified: Secondary | ICD-10-CM | POA: Diagnosis not present

## 2022-07-03 LAB — CBC WITH DIFFERENTIAL/PLATELET
Abs Immature Granulocytes: 0.15 10*3/uL — ABNORMAL HIGH (ref 0.00–0.07)
Basophils Absolute: 0 10*3/uL (ref 0.0–0.1)
Basophils Relative: 0 %
Eosinophils Absolute: 0 10*3/uL (ref 0.0–0.5)
Eosinophils Relative: 0 %
HCT: 31.7 % — ABNORMAL LOW (ref 39.0–52.0)
Hemoglobin: 10.6 g/dL — ABNORMAL LOW (ref 13.0–17.0)
Immature Granulocytes: 2 %
Lymphocytes Relative: 5 %
Lymphs Abs: 0.4 10*3/uL — ABNORMAL LOW (ref 0.7–4.0)
MCH: 30.5 pg (ref 26.0–34.0)
MCHC: 33.4 g/dL (ref 30.0–36.0)
MCV: 91.4 fL (ref 80.0–100.0)
Monocytes Absolute: 0.7 10*3/uL (ref 0.1–1.0)
Monocytes Relative: 8 %
Neutro Abs: 7.3 10*3/uL (ref 1.7–7.7)
Neutrophils Relative %: 85 %
Platelets: 117 10*3/uL — ABNORMAL LOW (ref 150–400)
RBC: 3.47 MIL/uL — ABNORMAL LOW (ref 4.22–5.81)
RDW: 15.6 % — ABNORMAL HIGH (ref 11.5–15.5)
WBC: 8.5 10*3/uL (ref 4.0–10.5)
nRBC: 0 % (ref 0.0–0.2)

## 2022-07-03 LAB — BASIC METABOLIC PANEL
Anion gap: 11 (ref 5–15)
BUN: 28 mg/dL — ABNORMAL HIGH (ref 8–23)
CO2: 18 mmol/L — ABNORMAL LOW (ref 22–32)
Calcium: 9.5 mg/dL (ref 8.9–10.3)
Chloride: 108 mmol/L (ref 98–111)
Creatinine, Ser: 1.57 mg/dL — ABNORMAL HIGH (ref 0.61–1.24)
GFR, Estimated: 45 mL/min — ABNORMAL LOW (ref >60)
Glucose, Bld: 94 mg/dL (ref 70–99)
Potassium: 3.2 mmol/L — ABNORMAL LOW (ref 3.5–5.1)
Sodium: 137 mmol/L (ref 135–145)

## 2022-07-03 LAB — BRAIN NATRIURETIC PEPTIDE: B Natriuretic Peptide: 104.2 pg/mL — ABNORMAL HIGH (ref 0.0–100.0)

## 2022-07-03 LAB — MAGNESIUM: Magnesium: 1.7 mg/dL (ref 1.7–2.4)

## 2022-07-03 LAB — C-REACTIVE PROTEIN: CRP: 34.9 mg/dL — ABNORMAL HIGH (ref <1.0)

## 2022-07-03 LAB — PROCALCITONIN: Procalcitonin: 20.22 ng/mL

## 2022-07-03 MED ORDER — POTASSIUM CHLORIDE CRYS ER 20 MEQ PO TBCR
40.0000 meq | EXTENDED_RELEASE_TABLET | Freq: Once | ORAL | Status: AC
  Administered 2022-07-03: 40 meq via ORAL
  Filled 2022-07-03: qty 2

## 2022-07-03 MED ORDER — POTASSIUM CHLORIDE CRYS ER 20 MEQ PO TBCR: 20.0000 meq | EXTENDED_RELEASE_TABLET | Freq: Once | ORAL | Status: DC

## 2022-07-03 MED ORDER — MAGNESIUM SULFATE 2 GM/50ML IV SOLN
2.0000 g | Freq: Once | INTRAVENOUS | Status: AC
  Administered 2022-07-03: 2 g via INTRAVENOUS
  Filled 2022-07-03: qty 50

## 2022-07-03 MED ORDER — FUROSEMIDE 20 MG PO TABS
20.0000 mg | ORAL_TABLET | Freq: Once | ORAL | Status: AC
  Administered 2022-07-03: 20 mg via ORAL
  Filled 2022-07-03: qty 1

## 2022-07-03 MED ORDER — IPRATROPIUM-ALBUTEROL 0.5-2.5 (3) MG/3ML IN SOLN
3.0000 mL | Freq: Two times a day (BID) | RESPIRATORY_TRACT | Status: DC
  Administered 2022-07-03 – 2022-07-05 (×4): 3 mL via RESPIRATORY_TRACT
  Filled 2022-07-03 (×5): qty 3

## 2022-07-03 MED ORDER — BUDESONIDE 0.25 MG/2ML IN SUSP
0.2500 mg | Freq: Two times a day (BID) | RESPIRATORY_TRACT | Status: DC
  Administered 2022-07-03 – 2022-07-05 (×4): 0.25 mg via RESPIRATORY_TRACT
  Filled 2022-07-03 (×5): qty 2

## 2022-07-03 MED ORDER — SODIUM CHLORIDE 0.9 % IV SOLN: INTRAVENOUS | Status: DC

## 2022-07-03 MED ORDER — OXYCODONE HCL 5 MG PO TABS: 5.0000 mg | ORAL_TABLET | Freq: Four times a day (QID) | ORAL | Status: DC | PRN

## 2022-07-03 NOTE — Evaluation (Signed)
Occupational Therapy Evaluation Patient Details Name: [name] MRN: [id number] DOB: 09-09-1945 Today's Date: 07/03/2022   History of Present Illness 76 y.o. male with PMH significant for HTN, DVT on Xarelto, history of bladder cancer, prostate cancer s/p TURP and radiation therapy, kidney stones, hypothyroidism, 11/2, patient presented to the ED with complaint of new onset of hematuria, back pain and fever.  He was diagnosed with pyelonephritis and admitted to the hospital 07/01/22.   Clinical Impression   Pt reports PTA he was independent with ADL/IADL and functional mobility. Pt currently demonstrates ability to complete ADL and functional mobility at modified independent level with increased time. Pt reports feeling close to his baseline and improved respiratory function following breathing treatment that was completed prior to OT arrival. Pt on RA throughout session, SpO2 >94% on RA. Patient evaluated by Occupational Therapy with no further acute OT needs identified. All education has been completed and the patient has no further questions. See below for any follow-up Occupational Therapy or equipment needs. OT to sign off. Thank you for referral.        Recommendations for follow up therapy are one component of a multi-disciplinary discharge planning process, led by the attending physician.  Recommendations may be updated based on patient status, additional functional criteria and insurance authorization.   Follow Up Recommendations  No OT follow up    Assistance Recommended at Discharge None  Patient can return home with the following      Functional Status Assessment  Patient has not had a recent decline in their functional status  Equipment Recommendations  None recommended by OT    Recommendations for Other Services       Precautions / Restrictions Precautions Precautions: None Restrictions Weight Bearing Restrictions: No      Mobility Bed Mobility Overal bed  mobility: Modified Independent Bed Mobility: Supine to Sit     Supine to sit: Modified independent (Device/Increase time)     General bed mobility comments: extra time    Transfers Overall transfer level: Modified independent Equipment used: None               General transfer comment: No physical assist required to rise from bed. Slow and somewhat effortful but likely due to height and low surface of bed.      Balance Overall balance assessment: Mild deficits observed, not formally tested                                         ADL either performed or assessed with clinical judgement   ADL Overall ADL's : Modified independent                                       General ADL Comments: increased time     Vision         Perception     Praxis      Pertinent Vitals/Pain Pain Assessment Pain Assessment: No/denies pain     Hand Dominance Right   Extremity/Trunk Assessment Upper Extremity Assessment Upper Extremity Assessment: Overall WFL for tasks assessed   Lower Extremity Assessment Lower Extremity Assessment: Overall WFL for tasks assessed       Communication Communication Communication: No difficulties   Cognition Arousal/Alertness: Awake/alert Behavior During Therapy: WFL for tasks assessed/performed Overall Cognitive Status: Within Functional  Limits for tasks assessed                                       General Comments  VSS SpO2 >94% on RA with exertion    Exercises     Shoulder Instructions      Home Living Family/patient expects to be discharged to:: Private residence Living Arrangements: Spouse/significant other Available Help at Discharge: Family Type of Home: House Home Access: Stairs to enter Technical brewer of Steps: 3 Entrance Stairs-Rails: Can reach both Home Layout: One level     Bathroom Shower/Tub: Occupational psychologist: Handicapped  height Bathroom Accessibility: Yes   Home Equipment: Grab bars - tub/shower          Prior Functioning/Environment Prior Level of Function : Independent/Modified Independent             Mobility Comments: No device use, Ind ADLs Comments: Pt completes adls on own; occasionally sits in shower on chair.        OT Problem List: Cardiopulmonary status limiting activity      OT Treatment/Interventions:      OT Goals(Current goals can be found in the care plan section) Acute Rehab OT Goals Patient Stated Goal: to go home OT Goal Formulation: With patient Time For Goal Achievement: 07/17/22 Potential to Achieve Goals: Good  OT Frequency:      Co-evaluation              AM-PAC OT "6 Clicks" Daily Activity     Outcome Measure Help from another person eating meals?: None Help from another person taking care of personal grooming?: None Help from another person toileting, which includes using toliet, bedpan, or urinal?: None Help from another person bathing (including washing, rinsing, drying)?: None Help from another person to put on and taking off regular upper body clothing?: None Help from another person to put on and taking off regular lower body clothing?: None 6 Click Score: 24   End of Session Nurse Communication: Mobility status  Activity Tolerance: Patient tolerated treatment well Patient left: in chair;with call bell/phone within reach  OT Visit Diagnosis: Other abnormalities of gait and mobility (R26.89)                Time: 3662-9476 OT Time Calculation (min): 8 min Charges:  OT General Charges $OT Visit: 1 Visit OT Evaluation $OT Eval Low Complexity: 1 Low  Dan Dissinger OTR/L Acute Rehabilitation Services Office: [phone]   Wyn Forster 07/03/2022, 11:21 AM

## 2022-07-03 NOTE — Progress Notes (Signed)
Mobility Specialist Progress Note:   07/03/22 1319  Mobility  Activity Ambulated independently in hallway  Level of Assistance Modified independent, requires aide device or extra time  Assistive Device None  Distance Ambulated (ft) 200 ft  Activity Response Tolerated well  Mobility Referral Yes  $Mobility charge 1 Mobility   Pt received in bed and agreeable. No complaints. Pt left in bed with all needs met, call bell in reach, and family in room.   Encarnacion Bole Mobility Specialist-Acute Rehab Secure Chat only

## 2022-07-03 NOTE — Evaluation (Signed)
Physical Therapy Evaluation and Discharge Patient Details Name: [name] MRN: [id number] DOB: 10/14/1945 Today's Date: 07/03/2022  History of Present Illness  76 y.o. male with PMH significant for HTN, DVT on Xarelto, history of bladder cancer, prostate cancer s/p TURP and radiation therapy, kidney stones, hypothyroidism, 11/2, patient presented to the ED with complaint of new onset of hematuria, back pain and fever.  He was diagnosed with pyelonephritis and admitted to the hospital 07/01/22.  Clinical Impression  Patient evaluated by Physical Therapy with no further acute PT needs identified. All education has been completed and the patient has no further questions. Ambulating without assistive device, assist for lines/leads/foley only. SpO2 96% and greater on room air during evaluation. No evidence of LOB with backwards and side stepping challenges. Safe in congested areas and demonstrates good awareness. Declines to practice stairs, as he feels confident he will not have issues with this at home. See below for any follow-up Physical Therapy or equipment needs. PT is signing off. Thank you for this referral.        Recommendations for follow up therapy are one component of a multi-disciplinary discharge planning process, led by the attending physician.  Recommendations may be updated based on patient status, additional functional criteria and insurance authorization.  Follow Up Recommendations No PT follow up      Assistance Recommended at Discharge PRN  Patient can return home with the following  Assistance with cooking/housework;Assist for transportation    Equipment Recommendations None recommended by PT  Recommendations for Other Services       Functional Status Assessment Patient has not had a recent decline in their functional status     Precautions / Restrictions Precautions Precautions: None Restrictions Weight Bearing Restrictions: No      Mobility  Bed  Mobility Overal bed mobility: Modified Independent Bed Mobility: Supine to Sit     Supine to sit: Modified independent (Device/Increase time)     General bed mobility comments: extra time    Transfers Overall transfer level: Modified independent Equipment used: None               General transfer comment: No physical assist required to rise from bed. Slow and somewhat effortful but likely due to height and low surface of bed.    Ambulation/Gait Ambulation/Gait assistance: Supervision Gait Distance (Feet): 125 Feet Assistive device: None, IV Pole Gait Pattern/deviations: Step-through pattern, Decreased stride length Gait velocity: decreased Gait velocity interpretation: 1.31 - 2.62 ft/sec, indicative of limited community ambulator   General Gait Details: Slower gait speed but without overt instability noted. Initially pushing IV pole, therapist assisted with management of lines/leads/foley bag. Progressed to no UE support, including backwards and side stepping without LOB. Supervision for safety.  Stairs Stairs:  (Declines)          Wheelchair Mobility    Modified Rankin (Stroke Patients Only)       Balance Overall balance assessment: Mild deficits observed, not formally tested                                           Pertinent Vitals/Pain Pain Assessment Pain Assessment: No/denies pain    Home Living Family/patient expects to be discharged to:: Private residence Living Arrangements: Spouse/significant other Available Help at Discharge: Family Type of Home: House Home Access: Stairs to enter Entrance Stairs-Rails: Can reach both Entrance Stairs-Number of Steps: 3  Home Layout: One level Home Equipment: Grab bars - tub/shower      Prior Function Prior Level of Function : Independent/Modified Independent             Mobility Comments: No device use, Ind ADLs Comments: Pt completes adls on own; occasionally sits in shower  on chair.     Hand Dominance   Dominant Hand: Right    Extremity/Trunk Assessment   Upper Extremity Assessment Upper Extremity Assessment: Defer to OT evaluation    Lower Extremity Assessment Lower Extremity Assessment: Overall WFL for tasks assessed       Communication   Communication: No difficulties  Cognition Arousal/Alertness: Awake/alert Behavior During Therapy: WFL for tasks assessed/performed Overall Cognitive Status: Within Functional Limits for tasks assessed                                          General Comments General comments (skin integrity, edema, etc.): VSS, SpO2 96-99% on RA    Exercises     Assessment/Plan    PT Assessment Patient does not need any further PT services  PT Problem List         PT Treatment Interventions      PT Goals (Current goals can be found in the Care Plan section)  Acute Rehab PT Goals Patient Stated Goal: Get well and return home PT Goal Formulation: All assessment and education complete, DC therapy    Frequency       Co-evaluation               AM-PAC PT "6 Clicks" Mobility  Outcome Measure Help needed turning from your back to your side while in a flat bed without using bedrails?: None Help needed moving from lying on your back to sitting on the side of a flat bed without using bedrails?: None Help needed moving to and from a bed to a chair (including a wheelchair)?: None Help needed standing up from a chair using your arms (e.g., wheelchair or bedside chair)?: None Help needed to walk in hospital room?: A Little Help needed climbing 3-5 steps with a railing? : A Little 6 Click Score: 22    End of Session   Activity Tolerance: Patient tolerated treatment well Patient left: in chair;with call bell/phone within reach Nurse Communication: Mobility status PT Visit Diagnosis: Unsteadiness on feet (R26.81);Other abnormalities of gait and mobility (R26.89)    Time: 7543-6067 PT Time  Calculation (min) (ACUTE ONLY): 29 min   Charges:   PT Evaluation $PT Eval Low Complexity: 1 Low PT Treatments $Gait Training: 8-22 mins        Candie Mile, PT, DPT Physical Therapist Acute Rehabilitation Services [place]   Ellouise Newer 07/03/2022, 9:16 AM

## 2022-07-03 NOTE — Plan of Care (Signed)
  Problem: Nutrition: Goal: Adequate nutrition will be maintained Outcome: Progressing   Problem: Activity: Goal: Risk for activity intolerance will decrease Outcome: Progressing   Problem: Coping: Goal: Level of anxiety will decrease Outcome: Progressing   Problem: Pain Managment: Goal: General experience of comfort will improve Outcome: Progressing   Problem: Safety: Goal: Ability to remain free from injury will improve Outcome: Progressing

## 2022-07-03 NOTE — Progress Notes (Signed)
RT called due to patient complaining of wheezing with shortness of breath,patient with bilaterally expiratory wheezes and upper airway wheeze upon assessment. Respiratory treatment given with improvement, scheduled nebulizers re-ordered. RT will continue to monitor.

## 2022-07-03 NOTE — Progress Notes (Addendum)
PROGRESS NOTE  [name]  DOB: 1946/03/16  PCP: Haydee Salter, MD QAS:[id number]  DOA: 07/01/2022  LOS: 2 days  Hospital Day: 3  Brief narrative: [name] is a 76 y.o. male with PMH significant for HTN, DVT on Xarelto, history of bladder cancer, prostate cancer s/p TURP and radiation therapy, kidney stones, hypothyroidism, 11/2, patient presented to the ED with complaint of new onset of hematuria, back pain and fever.  He was diagnosed with pyelonephritis and admitted to the hospital.   Subjective:   Patient in bed, appears comfortable, denies any headache, no fever, no chest pain or pressure, no shortness of breath , no abdominal pain. No new focal weakness.   Assessment and plan:  Severe sepsis - POA - with right-sided pyelonephritis and Klebsiella bacteremia - he is on the Rocephin, sepsis pathophysiology has resolved.  Follow final culture sensitivity.  CT suggestive of possibly passed stone.   Hematuria - History of bladder/prostate cancer s/p TURP several years ago.  Follows up at [facility] urology.  Hematuria likely due to past right-sided ureteric stone along with pyelonephritis in the setting of patient being on Xarelto.  Now resolved.  Urine appears clear.  AKI on CKD 3A.  Baseline creatinine around 1.2. -  Due to sepsis.  Improved after hydration.  No evidence of fluid overload.  Hold further hydration gentle Lasix and monitor.  Baseline creatinine less than 1.2.  Creatinine was elevated to 2.07 on admission, likely due to sepsis.  Improving with IV fluid.   Dyspnea and exertion, some wheezing and crackles on exam.  BNP is elevated.  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  Continue nebulizer treatments, encouraged to sit in chair use I-S and flutter valve.    HTN PTA on Coreg 25 mg twice daily.  Continue the same  CAD  Continue Coreg, statin.  Continue Xarelto  Hypothyroidism  Synthroid  daily   DVT  Diagnosed with nonobstructive DVT on the right leg on 10/13.  Xarelto as above   Gout  Continue allopurinol 100 mg twice daily  Hypokalemia.  Replaced.  Goals of care   Code Status: Full Code     Diet:  Diet Order             Diet regular Room service appropriate? No; Fluid consistency: Thin  Diet effective now                   DVT prophylaxis:   rivaroxaban (XARELTO) tablet 20 mg   Antimicrobials: IV Rocephin Fluid: NS at 26 mill per hour Consultants: None Family Communication: Wife at bedside  Status is: Inpatient  Continue inhospital care because: Ongoing management of sepsis Level of care: Progressive   Dispo: The patient is from: Home              Anticipated d/c is to: Hopefully home in 1 to 2 days              Patient currently is not medically stable to d/c.   Difficult to place patient No     Infusions:   cefTRIAXone (ROCEPHIN)  IV 2 g (07/02/22 1654)    Scheduled Meds:  allopurinol  100 mg Oral BID   budesonide (PULMICORT) nebulizer solution  0.25 mg Nebulization BID   carvedilol  25 mg Oral BID WC   furosemide  20 mg Oral Once   influenza vaccine adjuvanted  0.5 mL Intramuscular Tomorrow-1000  ipratropium-albuterol  3 mL Nebulization BID   levothyroxine  88 mcg Oral Q0600   potassium chloride  40 mEq Oral Once   rivaroxaban  20 mg Oral Q supper   rosuvastatin  10 mg Oral Daily   saccharomyces boulardii  250 mg Oral BID    PRN meds: acetaminophen **OR** acetaminophen, ipratropium-albuterol, labetalol, ondansetron **OR** ondansetron (ZOFRAN) IV, oxyCODONE, senna-docusate   Objective: Vitals:   07/03/22 0336 07/03/22 0800  BP: 122/73 139/87  Pulse: 61 71  Resp: 19 17  Temp: 97.7 F (36.5 C) 98.2 F (36.8 C)  SpO2: 96% 98%    Intake/Output Summary (Last 24 hours) at 07/03/2022 0850 Last data filed at 07/03/2022 0131 Gross per 24 hour  Intake --  Output 2800 ml  Net -2800 ml   Filed Weights   07/01/22 1032   Weight: 106.1 kg   Weight change:  Body mass index is 31.74 kg/m.   Physical Exam:  Awake Alert, No new F.N deficits, Normal affect [place].AT,PERRAL Supple Neck, No JVD,   Symmetrical Chest wall movement, Good air movement bilaterally, few rales and wheezes RRR,No Gallops, Rubs or new Murmurs,  +ve B.Sounds, Abd Soft, No tenderness,   No Cyanosis, Clubbing or edema    Data Review: I have personally reviewed the laboratory data and studies available.  Recent Labs  Lab 07/01/22 1029 07/02/22 0234 07/03/22 0239  WBC 10.3 9.2 8.5  HGB 13.5 11.8* 10.6*  HCT 40.1 36.3* 31.7*  PLT 152 101* 117*  MCV 90.1 94.3 91.4  MCH 30.3 30.6 30.5  MCHC 33.7 32.5 33.4  RDW 14.8 15.5 15.6*  LYMPHSABS 0.4*  --  0.4*  MONOABS 0.6  --  0.7  EOSABS 0.0  --  0.0  BASOSABS 0.0  --  0.0    Recent Labs  Lab 07/01/22 1029 07/01/22 1245 07/01/22 1859 07/02/22 0234 07/03/22 0239  NA 136  --   --  134* 137  K 4.3  --   --  4.2 3.2*  CL 106  --   --  106 108  CO2 21*  --   --  17* 18*  GLUCOSE 107*  --   --  80 94  BUN 27*  --   --  29* 28*  CREATININE 2.07*  --   --  1.98* 1.57*  CALCIUM 9.8  --   --  8.7* 9.5  AST 24  --   --   --   --   ALT 21  --   --   --   --   ALKPHOS 73  --   --   --   --   BILITOT 1.3*  --   --   --   --   ALBUMIN 2.9*  --   --   --   --   MG  --   --   --   --  1.7  CRP  --   --   --   --  34.9*  PROCALCITON  --   --  30.50 30.64 20.22  LATICACIDVEN 2.1* 2.0*  --   --   --   INR 2.7*  --   --   --   --   BNP  --   --   --   --  104.2*        Signature  -  Lala Lund M.D on 07/03/2022 at 8:51 AM   -  To page go to [email or web address]

## 2022-07-04 ENCOUNTER — Inpatient Hospital Stay (HOSPITAL_COMMUNITY): Payer: Medicare Other

## 2022-07-04 DIAGNOSIS — I5031 Acute diastolic (congestive) heart failure: Secondary | ICD-10-CM

## 2022-07-04 DIAGNOSIS — N179 Acute kidney failure, unspecified: Secondary | ICD-10-CM | POA: Diagnosis not present

## 2022-07-04 DIAGNOSIS — N12 Tubulo-interstitial nephritis, not specified as acute or chronic: Secondary | ICD-10-CM | POA: Diagnosis not present

## 2022-07-04 DIAGNOSIS — A4159 Other Gram-negative sepsis: Secondary | ICD-10-CM | POA: Diagnosis not present

## 2022-07-04 LAB — CBC WITH DIFFERENTIAL/PLATELET
Abs Immature Granulocytes: 0.02 10*3/uL (ref 0.00–0.07)
Basophils Absolute: 0 10*3/uL (ref 0.0–0.1)
Basophils Relative: 0 %
Eosinophils Absolute: 0.1 10*3/uL (ref 0.0–0.5)
Eosinophils Relative: 1 %
HCT: 34.7 % — ABNORMAL LOW (ref 39.0–52.0)
Hemoglobin: 11.5 g/dL — ABNORMAL LOW (ref 13.0–17.0)
Immature Granulocytes: 0 %
Lymphocytes Relative: 7 %
Lymphs Abs: 0.6 10*3/uL — ABNORMAL LOW (ref 0.7–4.0)
MCH: 30 pg (ref 26.0–34.0)
MCHC: 33.1 g/dL (ref 30.0–36.0)
MCV: 90.6 fL (ref 80.0–100.0)
Monocytes Absolute: 0.8 10*3/uL (ref 0.1–1.0)
Monocytes Relative: 10 %
Neutro Abs: 6.8 10*3/uL (ref 1.7–7.7)
Neutrophils Relative %: 82 %
Platelets: 135 10*3/uL — ABNORMAL LOW (ref 150–400)
RBC: 3.83 MIL/uL — ABNORMAL LOW (ref 4.22–5.81)
RDW: 15.4 % (ref 11.5–15.5)
WBC: 8.2 10*3/uL (ref 4.0–10.5)
nRBC: 0 % (ref 0.0–0.2)

## 2022-07-04 LAB — BASIC METABOLIC PANEL
Anion gap: 11 (ref 5–15)
BUN: 27 mg/dL — ABNORMAL HIGH (ref 8–23)
CO2: 19 mmol/L — ABNORMAL LOW (ref 22–32)
Calcium: 9.8 mg/dL (ref 8.9–10.3)
Chloride: 107 mmol/L (ref 98–111)
Creatinine, Ser: 1.42 mg/dL — ABNORMAL HIGH (ref 0.61–1.24)
GFR, Estimated: 51 mL/min — ABNORMAL LOW (ref >60)
Glucose, Bld: 96 mg/dL (ref 70–99)
Potassium: 4.1 mmol/L (ref 3.5–5.1)
Sodium: 137 mmol/L (ref 135–145)

## 2022-07-04 LAB — ECHOCARDIOGRAM COMPLETE
AR max vel: 1.23 cm2
AV Area VTI: 1.32 cm2
AV Area mean vel: 1.24 cm2
AV Mean grad: 9 mmHg
AV Peak grad: 17 mmHg
Ao pk vel: 2.06 m/s
Area-P 1/2: 2.91 cm2
Height: 72 in
S' Lateral: 2.8 cm
Weight: 3744 oz

## 2022-07-04 LAB — C-REACTIVE PROTEIN: CRP: 26.4 mg/dL — ABNORMAL HIGH (ref <1.0)

## 2022-07-04 LAB — CULTURE, BLOOD (ROUTINE X 2)
Special Requests: ADEQUATE
Special Requests: ADEQUATE

## 2022-07-04 LAB — URINE CULTURE: Culture: NO GROWTH

## 2022-07-04 LAB — MAGNESIUM: Magnesium: 1.8 mg/dL (ref 1.7–2.4)

## 2022-07-04 LAB — PROCALCITONIN: Procalcitonin: 9.36 ng/mL

## 2022-07-04 LAB — BRAIN NATRIURETIC PEPTIDE: B Natriuretic Peptide: 118.4 pg/mL — ABNORMAL HIGH (ref 0.0–100.0)

## 2022-07-04 MED ORDER — FUROSEMIDE 40 MG PO TABS
40.0000 mg | ORAL_TABLET | Freq: Once | ORAL | Status: AC
  Administered 2022-07-04: 40 mg via ORAL
  Filled 2022-07-04: qty 1

## 2022-07-04 NOTE — Progress Notes (Signed)
  Echocardiogram 2D Echocardiogram has been performed.  [name] 07/04/2022, 10:08 AM

## 2022-07-04 NOTE — Progress Notes (Signed)
PROGRESS NOTE  [name]  DOB: [birth year]  PCP: Haydee Salter, MD ONG:[id number]  DOA: 07/01/2022  LOS: 3 days  Hospital Day: 4  Brief narrative: [name] is a 76 y.o. male with PMH significant for HTN, DVT on Xarelto, history of bladder cancer, prostate cancer s/p TURP and radiation therapy, kidney stones, hypothyroidism, 11/2, patient presented to the ED with complaint of new onset of hematuria, back pain and fever.  He was diagnosed with pyelonephritis and admitted to the hospital.   Subjective:  Patient in bed, appears comfortable, denies any headache, no fever, no chest pain or pressure, no shortness of breath , no abdominal pain. No new focal weakness.    Assessment and plan:  Severe sepsis - POA - with right-sided pyelonephritis and Klebsiella bacteremia - he is on the Rocephin, sepsis pathophysiology has resolved.  Follow final culture sensitivity.  CT suggestive of possibly passed stone.   Hematuria - History of bladder/prostate cancer s/p TURP several years ago.  Follows up at [facility] urology.  Hematuria likely due to past right-sided ureteric stone along with pyelonephritis in the setting of patient being on Xarelto.  Now resolved.  Urine appears clear.  AKI on CKD 3A.  Baseline creatinine around 1.2. -  Due to sepsis.  Improved after hydration.  No evidence of fluid overload.  Hold further hydration gentle Lasix and monitor.  Baseline creatinine less than 1.2.  Creatinine was elevated to 2.07 on admission, likely due to sepsis.  Improving with IV fluid.   Dyspnea and exertion, some wheezing and crackles on exam.  BNP is elevated.  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  Continue nebulizer treatments, encouraged to sit in chair use I-S and flutter valve.    HTN PTA on Coreg 25 mg twice daily.  Continue the same  CAD  Continue Coreg, statin.  Continue Xarelto  Hypothyroidism  Synthroid  daily   DVT  Diagnosed with nonobstructive DVT on the right leg on 10/13.  Xarelto as above   Gout  Continue allopurinol 100 mg twice daily  Hypokalemia.  Replaced.  Goals of care   Code Status: Full Code     Diet:  Diet Order             Diet regular Room service appropriate? No; Fluid consistency: Thin  Diet effective now                   DVT prophylaxis:   rivaroxaban (XARELTO) tablet 20 mg   Antimicrobials: IV Rocephin Fluid: NS at 41 mill per hour Consultants: None Family Communication: Wife at bedside  Status is: Inpatient  Continue inhospital care because: Ongoing management of sepsis Level of care: Progressive   Dispo: The patient is from: Home              Anticipated d/c is to: Hopefully home in 1 to 2 days              Patient currently is not medically stable to d/c.   Difficult to place patient No  Infusions:   cefTRIAXone (ROCEPHIN)  IV 2 g (07/03/22 1728)    Scheduled Meds:  allopurinol  100 mg Oral BID   budesonide (PULMICORT) nebulizer solution  0.25 mg Nebulization BID   carvedilol  25 mg Oral BID WC   furosemide  40 mg Oral Once   influenza vaccine adjuvanted  0.5 mL Intramuscular Tomorrow-1000   ipratropium-albuterol  3 mL Nebulization BID   levothyroxine  88 mcg Oral Q0600   rivaroxaban  20 mg Oral Q supper   rosuvastatin  10 mg Oral Daily   saccharomyces boulardii  250 mg Oral BID    PRN meds: acetaminophen **OR** acetaminophen, ipratropium-albuterol, labetalol, ondansetron **OR** ondansetron (ZOFRAN) IV, oxyCODONE, senna-docusate   Objective: Vitals:   07/04/22 0354 07/04/22 0753  BP: 132/84 (!) 142/85  Pulse: (!) 53 (!) 59  Resp: 17 15  Temp: 97.8 F (36.6 C) 97.9 F (36.6 C)  SpO2: 98%     Intake/Output Summary (Last 24 hours) at 07/04/2022 0953 Last data filed at 07/04/2022 0400 Gross per 24 hour  Intake 120 ml  Output 4800 ml  Net -4680 ml   Filed Weights   07/01/22 1032  Weight: 106.1 kg   Weight change:   Body mass index is 31.74 kg/m.   Physical Exam:  Awake Alert, No new F.N deficits, Normal affect [place].AT,PERRAL Supple Neck, No JVD,   Symmetrical Chest wall movement, Good air movement bilaterally, few rales RRR,No Gallops, Rubs or new Murmurs,  +ve B.Sounds, Abd Soft, No tenderness,   No Cyanosis, Clubbing or edema     Data Review: I have personally reviewed the laboratory data and studies available.  Recent Labs  Lab 07/01/22 1029 07/02/22 0234 07/03/22 0239 07/04/22 0327  WBC 10.3 9.2 8.5 8.2  HGB 13.5 11.8* 10.6* 11.5*  HCT 40.1 36.3* 31.7* 34.7*  PLT 152 101* 117* 135*  MCV 90.1 94.3 91.4 90.6  MCH 30.3 30.6 30.5 30.0  MCHC 33.7 32.5 33.4 33.1  RDW 14.8 15.5 15.6* 15.4  LYMPHSABS 0.4*  --  0.4* 0.6*  MONOABS 0.6  --  0.7 0.8  EOSABS 0.0  --  0.0 0.1  BASOSABS 0.0  --  0.0 0.0    Recent Labs  Lab 07/01/22 1029 07/01/22 1245 07/01/22 1859 07/02/22 0234 07/03/22 0239 07/04/22 0327  NA 136  --   --  134* 137 137  K 4.3  --   --  4.2 3.2* 4.1  CL 106  --   --  106 108 107  CO2 21*  --   --  17* 18* 19*  GLUCOSE 107*  --   --  80 94 96  BUN 27*  --   --  29* 28* 27*  CREATININE 2.07*  --   --  1.98* 1.57* 1.42*  CALCIUM 9.8  --   --  8.7* 9.5 9.8  AST 24  --   --   --   --   --   ALT 21  --   --   --   --   --   ALKPHOS 73  --   --   --   --   --   BILITOT 1.3*  --   --   --   --   --   ALBUMIN 2.9*  --   --   --   --   --   MG  --   --   --   --  1.7 1.8  CRP  --   --   --   --  34.9* 26.4*  PROCALCITON  --   --  30.50 30.64 20.22 9.36  LATICACIDVEN 2.1* 2.0*  --   --   --   --   INR 2.7*  --   --   --   --   --   BNP  --   --   --   --  104.2* 118.4*        Signature  -  Lala Lund M.D on 07/04/2022 at 9:53 AM   -  To page go to [email or web address]

## 2022-07-04 NOTE — Progress Notes (Signed)
Mobility Specialist Progress Note:   07/04/22 1159  Mobility  Activity Ambulated independently in hallway  Level of Assistance Modified independent, requires aide device or extra time  Assistive Device None  Distance Ambulated (ft) 500 ft  Activity Response Tolerated well  Mobility Referral Yes  $Mobility charge 1 Mobility   Pt received in chair and agreeable. No complaints. Pt left in chair with all needs met and call bell in reach.   [name] Mobility Specialist-Acute Rehab Secure Chat only  

## 2022-07-04 NOTE — Plan of Care (Signed)

## 2022-07-05 ENCOUNTER — Other Ambulatory Visit (HOSPITAL_COMMUNITY): Payer: Self-pay

## 2022-07-05 ENCOUNTER — Telehealth: Payer: Self-pay

## 2022-07-05 DIAGNOSIS — A419 Sepsis, unspecified organism: Secondary | ICD-10-CM | POA: Diagnosis not present

## 2022-07-05 LAB — CBC WITH DIFFERENTIAL/PLATELET
Abs Immature Granulocytes: 0.04 10*3/uL (ref 0.00–0.07)
Basophils Absolute: 0 10*3/uL (ref 0.0–0.1)
Basophils Relative: 0 %
Eosinophils Absolute: 0.1 10*3/uL (ref 0.0–0.5)
Eosinophils Relative: 1 %
HCT: 34.6 % — ABNORMAL LOW (ref 39.0–52.0)
Hemoglobin: 12 g/dL — ABNORMAL LOW (ref 13.0–17.0)
Immature Granulocytes: 1 %
Lymphocytes Relative: 14 %
Lymphs Abs: 0.9 10*3/uL (ref 0.7–4.0)
MCH: 30.5 pg (ref 26.0–34.0)
MCHC: 34.7 g/dL (ref 30.0–36.0)
MCV: 88 fL (ref 80.0–100.0)
Monocytes Absolute: 1 10*3/uL (ref 0.1–1.0)
Monocytes Relative: 15 %
Neutro Abs: 4.6 10*3/uL (ref 1.7–7.7)
Neutrophils Relative %: 69 %
Platelets: 155 10*3/uL (ref 150–400)
RBC: 3.93 MIL/uL — ABNORMAL LOW (ref 4.22–5.81)
RDW: 15.5 % (ref 11.5–15.5)
WBC: 6.5 10*3/uL (ref 4.0–10.5)
nRBC: 0 % (ref 0.0–0.2)

## 2022-07-05 LAB — BASIC METABOLIC PANEL
Anion gap: 7 (ref 5–15)
BUN: 29 mg/dL — ABNORMAL HIGH (ref 8–23)
CO2: 22 mmol/L (ref 22–32)
Calcium: 9.9 mg/dL (ref 8.9–10.3)
Chloride: 107 mmol/L (ref 98–111)
Creatinine, Ser: 1.35 mg/dL — ABNORMAL HIGH (ref 0.61–1.24)
GFR, Estimated: 54 mL/min — ABNORMAL LOW (ref >60)
Glucose, Bld: 94 mg/dL (ref 70–99)
Potassium: 3.5 mmol/L (ref 3.5–5.1)
Sodium: 136 mmol/L (ref 135–145)

## 2022-07-05 LAB — MAGNESIUM: Magnesium: 1.7 mg/dL (ref 1.7–2.4)

## 2022-07-05 LAB — PROCALCITONIN: Procalcitonin: 5.27 ng/mL

## 2022-07-05 LAB — BRAIN NATRIURETIC PEPTIDE: B Natriuretic Peptide: 59.6 pg/mL (ref 0.0–100.0)

## 2022-07-05 LAB — C-REACTIVE PROTEIN: CRP: 14.5 mg/dL — ABNORMAL HIGH (ref <1.0)

## 2022-07-05 MED ORDER — CIPROFLOXACIN HCL 500 MG PO TABS
500.0000 mg | ORAL_TABLET | Freq: Two times a day (BID) | ORAL | 0 refills | Status: DC
  Filled 2022-07-05: qty 20, 5d supply, fill #0
  Filled 2022-07-05: qty 20, 10d supply, fill #0

## 2022-07-05 MED ORDER — MAGNESIUM SULFATE 2 GM/50ML IV SOLN
2.0000 g | Freq: Once | INTRAVENOUS | Status: AC
  Administered 2022-07-05: 2 g via INTRAVENOUS
  Filled 2022-07-05: qty 50

## 2022-07-05 MED ORDER — POTASSIUM CHLORIDE CRYS ER 20 MEQ PO TBCR
40.0000 meq | EXTENDED_RELEASE_TABLET | Freq: Once | ORAL | Status: AC
  Administered 2022-07-05: 40 meq via ORAL
  Filled 2022-07-05: qty 2

## 2022-07-05 NOTE — Telephone Encounter (Signed)
Transition Care Management Unsuccessful Follow-up Telephone Call  Date of discharge and from where:  07/05/22 [facility]. Dx: Pyelonephritis, sepsis  Attempts:  1st Attempt  Reason for unsuccessful TCM follow-up call:  Left voice message

## 2022-07-05 NOTE — Discharge Summary (Addendum)
[name] RKY:[id number] DOB: 02-04-1946 DOA: 07/01/2022  PCP: Haydee Salter, MD  Admit date: 07/01/2022  Discharge date: 07/05/2022  Admitted From: Home   Disposition:  Home   Recommendations for Outpatient Follow-up:   Follow up with PCP in 1-2 weeks  PCP Please obtain BMP/CBC, 2 view CXR in 1week,  (see Discharge instructions)   PCP Please follow up on the following pending results: CBC, CMP, magnesium in 2 to 3 days, needs outpatient referral for urologist.  Please get one-time outpatient cardiology referral for nonspecific echo findings.   Home Health: None   Equipment/Devices: None  Consultations: None  Discharge Condition: Stable    CODE STATUS: Full    Diet Recommendation: Heart Healthy     Chief Complaint  Patient presents with   Code Sepsis     Brief history of present illness from the day of admission and additional interim summary    76 y.o. male with PMH significant for HTN, DVT on Xarelto, history of bladder cancer, prostate cancer s/p TURP and radiation therapy, kidney stones, hypothyroidism, 11/2, patient presented to the ED with complaint of new onset of hematuria, back pain and fever.  He was diagnosed with pyelonephritis and admitted to the hospital.                                                                  Hospital Course    Severe sepsis - POA - with right-sided pyelonephritis and Klebsiella bacteremia - he was kept on IV fluids along with IV Rocephin with good results, sepsis pathophysiology has completely resolved he is completely symptom-free will be switched to oral Ciprofor 10 more days per ID Dr Juleen China and ID Pharm and discharge home, request PCP to arrange for one-time outpatient urology follow-up, CT was suggestive of possibly passed stone.   Hematuria - History of  bladder/prostate cancer s/p TURP several years ago.  Follows up at [facility] urology.  Hematuria likely due to past right-sided ureteric stone along with pyelonephritis in the setting of patient being on Xarelto.  Now resolved.      AKI on CKD 3A.  Baseline creatinine around 1.2. -  Due to sepsis.  Improved after hydration.  No evidence of fluid overload.  Hold further hydration gentle Lasix and monitor.  Baseline creatinine less than 1.2.  Creatinine was elevated to 2.07 on admission, likely due to sepsis, much improved after adequate hydration.  PCP to recheck next visit.   Dyspnea and exertion, some wheezing and crackles on exam.   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  PCP to monitor closely.    HTN PTA on Coreg 25 mg twice daily.  Continue the same   CAD  Continue Coreg, statin.  Continue Xarelto   Hypothyroidism  Synthroid daily   DVT  Diagnosed with nonobstructive DVT on the right leg on 10/13.  Xarelto as above   Gout  Continue allopurinol 100 mg twice daily    Discharge diagnosis     Principal Problem:   Sepsis [facility])    Discharge instructions    Discharge Instructions     Discharge instructions   Complete by: As directed    Follow with Primary MD Haydee Salter, MD in 2 days   Get CBC, CMP, Magnesium -  checked next visit by Primary MD    Activity: As tolerated with Full fall precautions use walker/cane & assistance as needed  Disposition Home    Diet: Heart Healthy    Special Instructions: If you have smoked or chewed Tobacco  in the last 2 yrs please stop smoking, stop any regular Alcohol  and or any Recreational drug use.  On your next visit with your primary care physician please Get Medicines reviewed and adjusted.  Please request your Prim.MD to go over all Hospital Tests and  Procedure/Radiological results at the follow up, please get all Hospital records sent to your Prim MD by signing hospital release before you go home.  If you experience worsening of your admission symptoms, develop shortness of breath, life threatening emergency, suicidal or homicidal thoughts you must seek medical attention immediately by calling 911 or calling your MD immediately  if symptoms less severe.  You Must read complete instructions/literature along with all the possible adverse reactions/side effects for all the Medicines you take and that have been prescribed to you. Take any new Medicines after you have completely understood and accpet all the possible adverse reactions/side effects.       Discharge Medications   Allergies as of 07/05/2022   No Known Allergies      Medication List     TAKE these medications    allopurinol 100 MG tablet Commonly known as: ZYLOPRIM TAKE 1 TABLET(100 MG) BY MOUTH TWICE DAILY What changed: See the new instructions.   carvedilol 25 MG tablet Commonly known as: COREG TAKE 1 TABLET(25 MG) BY MOUTH TWICE DAILY What changed: See the new instructions.   ciprofloxacin 500 MG tablet Commonly known as: CIPRO Take 1 tablet (500 mg total) by mouth 2 (two) times daily.   COQ10 PO Take 1 Dose by mouth daily. Liquid form   folic acid 179 MCG tablet Commonly known as: FOLVITE Take 800 mcg by mouth daily.   levothyroxine 88 MCG tablet Commonly known as: SYNTHROID TAKE 1 TABLET(88 MCG) BY MOUTH DAILY BEFORE BREAKFAST What changed: See the new instructions.   OVER THE COUNTER MEDICATION Take 2 capsules by mouth daily. Brain Supplement   OVER THE COUNTER MEDICATION Take 2 tablets by mouth daily. Flush ( for toenail fungus)   polyethylene glycol 17 g packet Commonly known as: MIRALAX / GLYCOLAX Take 17 g by mouth daily.   PROBIOTIC GUMMIES PO Take 1 tablet by mouth daily.   Rivaroxaban Stater Pack (15 mg and 20 mg) Commonly known as:  XARELTO STARTER PACK Follow package directions: Take one '15mg'$  tablet by mouth twice a day. On day 22, switch to one '20mg'$  tablet once a day. Take with food.   rosuvastatin 10 MG tablet Commonly known as: CRESTOR TAKE 1 TABLET(10 MG) BY MOUTH DAILY What changed:  how much to take how to take this when to take this  VIVA DHA PO Take 1 tablet by mouth daily.          Follow-up Information     Haydee Salter, MD. Schedule an appointment as soon as possible for a visit in 2 day(s).   Specialty: Family Medicine Why: Get a referral for urologist within a week of discharge Contact information: [place] [address] [phone]                 Major procedures and Radiology Reports - PLEASE review detailed and final reports thoroughly  -     ECHOCARDIOGRAM COMPLETE  Result Date: 07/04/2022    ECHOCARDIOGRAM REPORT   Patient Name:   CLEBURN MAIOLO Date of Exam: 07/04/2022 Medical Rec #:  [id number]    Height:       72.0 in Accession #:    [id number]   Weight:       234.0 lb Date of Birth:  December 03, 1945     BSA:          2.278 m Patient Age:    6 years     BP:           142/85 mmHg Patient Gender: M            HR:           58 bpm. Exam Location:  Inpatient Procedure: 2D Echo Indications:    acute diastolic chf.  History:        Patient has prior history of Echocardiogram examinations, most                 recent 02/13/2021. Risk Factors:Hypertension and Dyslipidemia.  Sonographer:    Johny Chess RDCS Referring Phys: Graylin Shiver [facility]  Sonographer Comments: Image acquisition challenging due to respiratory motion. IMPRESSIONS  1. Left ventricular ejection fraction, by estimation, is 55%. The left ventricle has normal function. The left ventricle has no regional wall motion abnormalities. Left ventricular diastolic parameters were grossly normal for age.  2. Right ventricular systolic function is mildly reduced at the apex. The right ventricular size is  mildly enlarged. There is normal pulmonary artery systolic pressure. The estimated right ventricular systolic pressure is 02.6 mmHg.  3. The mitral valve is degenerative, mild mitral annular calcification. Mild mitral valve regurgitation. No evidence of mitral stenosis.  4. The aortic valve is abnormal. There is severe calcifcation of the aortic valve. Aortic valve regurgitation is not visualized. Mild aortic valve stenosis. Aortic valve mean gradient measures 9.0 mmHg.  5. The inferior vena cava is normal in size with greater than 50% respiratory variability, suggesting right atrial pressure of 3 mmHg. FINDINGS  Left Ventricle: Left ventricular ejection fraction, by estimation, is 55%. The left ventricle has normal function. The left ventricle has no regional wall motion abnormalities. The left ventricular internal cavity size was normal in size. There is no left ventricular hypertrophy. Left ventricular diastolic parameters were normal. Right Ventricle: The right ventricular size is mildly enlarged. No increase in right ventricular wall thickness. Right ventricular systolic function is mildly reduced. There is normal pulmonary artery systolic pressure. The tricuspid regurgitant velocity  is 1.72 m/s, and with an assumed right atrial pressure of 3 mmHg, the estimated right ventricular systolic pressure is 37.8 mmHg. Left Atrium: Left atrial size was normal in size. Right Atrium: Right atrial size was normal in size. Pericardium: There is no evidence of pericardial effusion. Mitral Valve: The mitral valve is degenerative in appearance. Mild mitral annular calcification. Mild  mitral valve regurgitation. No evidence of mitral valve stenosis. Tricuspid Valve: The tricuspid valve is normal in structure. Tricuspid valve regurgitation is trivial. No evidence of tricuspid stenosis. Aortic Valve: The aortic valve is abnormal. There is severe calcifcation of the aortic valve. Aortic valve regurgitation is not visualized.  Mild aortic stenosis is present. Aortic valve mean gradient measures 9.0 mmHg. Aortic valve peak gradient measures  17.0 mmHg. Aortic valve area, by VTI measures 1.32 cm. Pulmonic Valve: The pulmonic valve was not well visualized. Pulmonic valve regurgitation is not visualized. No evidence of pulmonic stenosis. Aorta: The ascending aorta was not well visualized and the aortic root is normal in size and structure. Venous: The inferior vena cava is normal in size with greater than 50% respiratory variability, suggesting right atrial pressure of 3 mmHg. IAS/Shunts: No atrial level shunt detected by color flow Doppler.  LEFT VENTRICLE PLAX 2D LVIDd:         4.50 cm   Diastology LVIDs:         2.80 cm   LV e' medial:    7.18 cm/s LV PW:         1.00 cm   LV E/e' medial:  14.8 LV IVS:        0.90 cm   LV e' lateral:   8.59 cm/s LVOT diam:     1.80 cm   LV E/e' lateral: 12.3 LV SV:         59 LV SV Index:   26 LVOT Area:     2.54 cm  RIGHT VENTRICLE             IVC RV S prime:     11.00 cm/s  IVC diam: 1.50 cm TAPSE (M-mode): 1.6 cm LEFT ATRIUM             Index        RIGHT ATRIUM           Index LA diam:        2.90 cm 1.27 cm/m   RA Area:     15.90 cm LA Vol (A2C):   44.2 ml 19.41 ml/m  RA Volume:   42.00 ml  18.44 ml/m LA Vol (A4C):   58.8 ml 25.82 ml/m LA Biplane Vol: 53.1 ml 23.31 ml/m  AORTIC VALVE AV Area (Vmax):    1.23 cm AV Area (Vmean):   1.24 cm AV Area (VTI):     1.32 cm AV Vmax:           206.00 cm/s AV Vmean:          138.000 cm/s AV VTI:            0.449 m AV Peak Grad:      17.0 mmHg AV Mean Grad:      9.0 mmHg LVOT Vmax:         99.30 cm/s LVOT Vmean:        67.150 cm/s LVOT VTI:          0.233 m LVOT/AV VTI ratio: 0.52  AORTA Ao Root diam: 3.60 cm MITRAL VALVE                TRICUSPID VALVE MV Area (PHT): 2.91 cm     TR Peak grad:   11.8 mmHg MV Decel Time: 261 msec     TR Vmax:        172.00 cm/s MV E velocity: 106.00 cm/s MV A velocity: 111.00 cm/s  SHUNTS MV E/A ratio:  0.95          Systemic VTI:  0.23 m                             Systemic Diam: 1.80 cm Cherlynn Kaiser MD Electronically signed by Cherlynn Kaiser MD Signature Date/Time: 07/04/2022/10:28:05 AM    Final    CT CHEST ABDOMEN PELVIS W CONTRAST  Result Date: 07/01/2022 CLINICAL DATA:  Hypotension and fever.  Recent hepatic abscess. EXAM: CT CHEST, ABDOMEN, AND PELVIS WITH CONTRAST TECHNIQUE: Multidetector CT imaging of the chest, abdomen and pelvis was performed following the standard protocol during bolus administration of intravenous contrast. RADIATION DOSE REDUCTION: This exam was performed according to the departmental dose-optimization program which includes automated exposure control, adjustment of the mA and/or kV according to patient size and/or use of iterative reconstruction technique. CONTRAST:  27m OMNIPAQUE IOHEXOL 350 MG/ML SOLN COMPARISON:  None Available. FINDINGS: CT CHEST FINDINGS Cardiovascular: Coronary artery calcification and aortic atherosclerotic calcification. Mediastinum/Nodes: No axillary or supraclavicular adenopathy. No mediastinal or hilar adenopathy. No pericardial fluid. Esophagus normal. Lungs/Pleura: No pulmonary infarction. No pneumonia. No pleural fluid. No pneumothorax Musculoskeletal: No aggressive osseous lesion. CT ABDOMEN AND PELVIS FINDINGS Hepatobiliary: No hepatic abscess. No enhancing hepatic lesion. Gallbladder normal. Normal biliary tree. Pancreas: Pancreas is normal. No ductal dilatation. No pancreatic inflammation. Spleen: Normal spleen Adrenals/urinary tract: Adrenal glands normal. Bilateral punctate nonobstructing renal calculi. No ureterolithiasis or obstructive uropathy. No bladder calculi. Asymmetric perinephric stranding on the RIGHT. Stomach/Bowel: Stomach, small bowel, appendix, and cecum are normal. Scattered diverticula throughout the colon. Rectum normal. Vascular/Lymphatic: Abdominal aorta is normal caliber. There is no retroperitoneal or periportal lymphadenopathy. No  pelvic lymphadenopathy. Reproductive: Penile prosthetic reservoir extends into a LEFT inguinal hernia. Other: No free fluid. Musculoskeletal: No aggressive osseous lesion Degenerative osteophytosis of the spine. IMPRESSION: 1. No acute cardiopulmonary findings. 2. No hepatic abscess. 3. Perinephric renal stranding on the RIGHT. Findings could indicate sequela passed RIGHT ureteral calculus. No ureteral calculus or obstructive uropathy currently. 4. Bilateral nephrolithiasis. 5. Mild pan diverticulosis without diverticulitis. 6. Penile prosthetic reservoir or extends into a LEFT inguinal hernia. Electronically Signed   By: SSuzy BouchardM.D.   On: 07/01/2022 14:59   DG Chest Portable 1 View  Result Date: 07/01/2022 CLINICAL DATA:  Provided history: Fever. Shortness of breath. Code sepsis. EXAM: PORTABLE CHEST 1 VIEW COMPARISON:  Prior chest radiographs 03/04/2021 and earlier. Heart size FINDINGS: Heart size at the upper limits of normal. Aortic atherosclerosis. 8 mm nodular opacity projecting at the level of the left lung base (see annotation on image). Elsewhere, there is no appreciable airspace consolidation. No pulmonary edema. No evidence of pleural effusion or pneumothorax. Degenerative changes of the spine and right glenohumeral joint. IMPRESSION: An 8 mm nodular opacity projects at the level of the left lung base. This may reflect a pulmonary nodule or an asymmetric nipple shadow. A repeat AP chest radiograph with nipple markers is recommended. If this finding does not correspond with the left-sided nipple marker on the follow-up radiograph, a chest CT is recommended for further evaluation. Otherwise, no evidence of acute cardiopulmonary abnormality. Aortic Atherosclerosis (ICD10-I70.0). Electronically Signed   By: KKellie SimmeringD.O.   On: 07/01/2022 11:01   VAS [place]OWER EXTREMITY VENOUS REFLUX  Result Date: 06/08/2022  Lower Venous Reflux Study Patient Name:  JRANKIN COOLMAN Date of Exam:    06/08/2022 Medical Rec #: [id number]    Accession #:  [id number] Date of Birth: 1946-05-06      Patient Gender: M Patient Age:   56 years Exam Location:  [facility] Procedure:      VAS [place] LOWER EXTREMITY VENOUS REFLUX Referring Phys: EMMA COLLINS --------------------------------------------------------------------------------  Indications: Swelling. Other Indications: Back surgery 5 weeks ago. Performing Technologist: Ralene Cork RVT  Examination Guidelines: A complete evaluation includes B-mode imaging, spectral Doppler, color Doppler, and power Doppler as needed of all accessible portions of each vessel. Bilateral testing is considered an integral part of a complete examination. Limited examinations for reoccurring indications may be performed as noted. The reflux portion of the exam is performed with the patient in reverse Trendelenburg. Significant venous reflux is defined as >500 ms in the superficial venous system, and >1 second in the deep venous system.  Venous Reflux Times +------------+--------+------+----------+------------+------------------------+ RIGHT       Reflux  Reflux  Reflux  Diameter cmsComments                             No       Yes     Time                                        +------------+--------+------+----------+------------+------------------------+ CFV                                             age indeterminate                                                        thrombus                 +------------+--------+------+----------+------------+------------------------+ FV prox                                         age indeterminate                                                        thrombus                 +------------+--------+------+----------+------------+------------------------+ FV mid                                          age indeterminate                                                         thrombus                 +------------+--------+------+----------+------------+------------------------+ FV dist  age indeterminate                                                        thrombus                 +------------+--------+------+----------+------------+------------------------+ Popliteal                                       age indeterminate                                                        thrombus                 +------------+--------+------+----------+------------+------------------------+ GSV at [facility]                                      patent                   +------------+--------+------+----------+------------+------------------------+ GSV mid calf                                    Patent                   +------------+--------+------+----------+------------+------------------------+  Summary: Right: - Non occlusive deep vein thrombus of indeterminate age in the CFV, proximal PFV, FV, popliteal vein, and PTV to the mid calf. Peroneal vein not visualized. Limited scan of the iliac vessels shows non occlusive distal EIV thrombus. Reflux scan not completed secondary to DVT.  *See table(s) above for measurements and observations. Electronically signed by Monica Martinez MD on 06/08/2022 at 2:08:18 PM.    Final     Micro Results     Recent Results (from the past 240 hour(s))  Culture, blood (Routine x 2)     Status: Abnormal   Collection Time: 07/01/22 10:34 AM   Specimen: BLOOD  Result Value Ref Range Status   Specimen Description BLOOD RIGHT ANTECUBITAL  Final   Special Requests   Final    BOTTLES DRAWN AEROBIC AND ANAEROBIC Blood Culture adequate volume   Culture  Setup Time   Final    GRAM NEGATIVE RODS IN BOTH AEROBIC AND ANAEROBIC BOTTLES CRITICAL VALUE NOTED.  VALUE IS CONSISTENT WITH PREVIOUSLY REPORTED AND CALLED VALUE.    Culture (A)  Final    KLEBSIELLA  OXYTOCA SUSCEPTIBILITIES PERFORMED ON PREVIOUS CULTURE WITHIN THE LAST 5 DAYS. Performed at [facility], [place] [address], [place],  [address]    Report Status 07/04/2022 FINAL  Final  Culture, blood (Routine x 2)     Status: Abnormal   Collection Time: 07/01/22 10:39 AM   Specimen: BLOOD LEFT FOREARM  Result Value Ref Range Status   Specimen Description BLOOD LEFT FOREARM  Final   Special Requests   Final    BOTTLES DRAWN AEROBIC AND ANAEROBIC Blood Culture adequate volume   Culture  Setup Time   Final  GRAM NEGATIVE RODS IN BOTH AEROBIC AND ANAEROBIC BOTTLES CRITICAL RESULT CALLED TO, READ BACK BY AND VERIFIED WITH:  C/ PHARMD JAMES L. 07/02/22 0520 A. LAFRANCE Performed at [facility], [place] [address], [place], [place] [address]    Culture KLEBSIELLA OXYTOCA (A)  Final   Report Status 07/04/2022 FINAL  Final   Organism ID, Bacteria KLEBSIELLA OXYTOCA  Final      Susceptibility   Klebsiella oxytoca - MIC*    AMPICILLIN >=32 RESISTANT Resistant     CEFAZOLIN 16 SENSITIVE Sensitive     CEFEPIME <=0.12 SENSITIVE Sensitive     CEFTAZIDIME <=1 SENSITIVE Sensitive     CEFTRIAXONE <=0.25 SENSITIVE Sensitive     CIPROFLOXACIN <=0.25 SENSITIVE Sensitive     GENTAMICIN <=1 SENSITIVE Sensitive     IMIPENEM <=0.25 SENSITIVE Sensitive     TRIMETH/SULFA <=20 SENSITIVE Sensitive     AMPICILLIN/SULBACTAM 8 SENSITIVE Sensitive     PIP/TAZO <=4 SENSITIVE Sensitive     * KLEBSIELLA OXYTOCA  Blood Culture ID Panel (Reflexed)     Status: Abnormal   Collection Time: 07/01/22 10:39 AM  Result Value Ref Range Status   Enterococcus faecalis NOT DETECTED NOT DETECTED Final   Enterococcus Faecium NOT DETECTED NOT DETECTED Final   Listeria monocytogenes NOT DETECTED NOT DETECTED Final   Staphylococcus species NOT DETECTED NOT DETECTED Final   Staphylococcus aureus (BCID) NOT DETECTED NOT DETECTED Final   Staphylococcus epidermidis NOT DETECTED NOT DETECTED Final    Staphylococcus lugdunensis NOT DETECTED NOT DETECTED Final   Streptococcus species NOT DETECTED NOT DETECTED Final   Streptococcus agalactiae NOT DETECTED NOT DETECTED Final   Streptococcus pneumoniae NOT DETECTED NOT DETECTED Final   Streptococcus pyogenes NOT DETECTED NOT DETECTED Final   A.calcoaceticus-baumannii NOT DETECTED NOT DETECTED Final   Bacteroides fragilis NOT DETECTED NOT DETECTED Final   Enterobacterales DETECTED (A) NOT DETECTED Final    Comment: Enterobacterales represent a large order of gram negative bacteria, not a single organism. CRITICAL RESULT CALLED TO, READ BACK BY AND VERIFIED WITH:  C/ PHARMD JAMES L. 07/02/22 0520 A. LAFRANCE    Enterobacter cloacae complex NOT DETECTED NOT DETECTED Final   Escherichia coli NOT DETECTED NOT DETECTED Final   Klebsiella aerogenes NOT DETECTED NOT DETECTED Final   Klebsiella oxytoca DETECTED (A) NOT DETECTED Final    Comment: CRITICAL RESULT CALLED TO, READ BACK BY AND VERIFIED WITH:  C/ PHARMD JAMES L. 07/02/22 0520 A. LAFRANCE    Klebsiella pneumoniae NOT DETECTED NOT DETECTED Final   Proteus species NOT DETECTED NOT DETECTED Final   Salmonella species NOT DETECTED NOT DETECTED Final   Serratia marcescens NOT DETECTED NOT DETECTED Final   Haemophilus influenzae NOT DETECTED NOT DETECTED Final   Neisseria meningitidis NOT DETECTED NOT DETECTED Final   Pseudomonas aeruginosa NOT DETECTED NOT DETECTED Final   Stenotrophomonas maltophilia NOT DETECTED NOT DETECTED Final   Candida albicans NOT DETECTED NOT DETECTED Final   Candida auris NOT DETECTED NOT DETECTED Final   Candida glabrata NOT DETECTED NOT DETECTED Final   Candida krusei NOT DETECTED NOT DETECTED Final   Candida parapsilosis NOT DETECTED NOT DETECTED Final   Candida tropicalis NOT DETECTED NOT DETECTED Final   Cryptococcus neoformans/gattii NOT DETECTED NOT DETECTED Final   CTX-M ESBL NOT DETECTED NOT DETECTED Final   Carbapenem resistance IMP NOT DETECTED NOT  DETECTED Final   Carbapenem resistance KPC NOT DETECTED NOT DETECTED Final   Carbapenem resistance NDM NOT DETECTED NOT DETECTED Final   Carbapenem  resist OXA 48 LIKE NOT DETECTED NOT DETECTED Final   Carbapenem resistance VIM NOT DETECTED NOT DETECTED Final    Comment: Performed at [facility], [place] [address] [place], [place] [address]  SARS Coronavirus 2 by RT PCR (hospital order, performed in [facility] hospital lab) *cepheid single result test* Anterior Nasal Swab     Status: None   Collection Time: 07/01/22 10:55 AM   Specimen: Anterior Nasal Swab  Result Value Ref Range Status   SARS Coronavirus 2 by RT PCR NEGATIVE NEGATIVE Final    Comment: (NOTE) SARS-CoV-2 target nucleic acids are NOT DETECTED.  The SARS-CoV-2 RNA is generally detectable in upper and lower respiratory specimens during the acute phase of infection. The lowest concentration of SARS-CoV-2 viral copies this assay can detect is 250 copies / mL. A negative result does not preclude SARS-CoV-2 infection and should not be used as the sole basis for treatment or other patient management decisions.  A negative result may occur with improper specimen collection / handling, submission of specimen other than nasopharyngeal swab, presence of viral mutation(s) within the areas targeted by this assay, and inadequate number of viral copies (<250 copies / mL). A negative result must be combined with clinical observations, patient history, and epidemiological information.  Fact Sheet for Patients:   https://www.patel.info/  Fact Sheet for Healthcare Providers: https://hall.com/  This test is not yet approved or  cleared by the [place] FDA and has been authorized for detection and/or diagnosis of SARS-CoV-2 by FDA under an Emergency Use Authorization (EUA).  This EUA will remain in effect (meaning this test can be used) for the duration of the COVID-19 declaration  under Section 564(b)(1) of the Act, 21 U.S.C. section 360bbb-3(b)(1), unless the authorization is terminated or revoked sooner.  Performed at [facility], [place] [address] [place], [place] [address]   Urine Culture     Status: None   Collection Time: 07/01/22  1:48 PM   Specimen: Urine, Clean Catch  Result Value Ref Range Status   Specimen Description URINE, CLEAN CATCH  Final   Special Requests NONE  Final   Culture   Final    NO GROWTH Performed at [facility], [place] [address], [place], [place] [address]    Report Status 07/04/2022 FINAL  Final    Today   Subjective    Quinterrius Errington today has no headache,no chest abdominal pain,no new weakness tingling or numbness, feels much better wants to go home today.    Objective   Blood pressure 136/86, pulse 62, temperature (!) 97.3 F (36.3 C), temperature source Oral, resp. rate 18, height 6' (1.829 m), weight 106.1 kg, SpO2 92 %.   Intake/Output Summary (Last 24 hours) at 07/05/2022 1006 Last data filed at 07/05/2022 0400 Gross per 24 hour  Intake --  Output 2800 ml  Net -2800 ml    Exam  Awake Alert, No new F.N deficits,    [place].AT,PERRAL Supple Neck,   Symmetrical Chest wall movement, Good air movement bilaterally, CTAB RRR,No Gallops,   +ve B.Sounds, Abd Soft, Non tender,  No Cyanosis, Clubbing or edema    Data Review   Recent Labs  Lab 07/01/22 1029 07/02/22 0234 07/03/22 0239 07/04/22 0327 07/05/22 0429  WBC 10.3 9.2 8.5 8.2 6.5  HGB 13.5 11.8* 10.6* 11.5* 12.0*  HCT 40.1 36.3* 31.7* 34.7* 34.6*  PLT 152 101* 117* 135* 155  MCV 90.1 94.3 91.4 90.6 88.0  MCH 30.3 30.6 30.5  30.0 30.5  MCHC 33.7 32.5 33.4 33.1 34.7  RDW 14.8 15.5 15.6* 15.4 15.5  LYMPHSABS 0.4*  --  0.4* 0.6* 0.9  MONOABS 0.6  --  0.7 0.8 1.0  EOSABS 0.0  --  0.0 0.1 0.1  BASOSABS 0.0  --  0.0 0.0 0.0    Recent Labs  Lab 07/01/22 1029 07/01/22 1245 07/01/22 1859 07/02/22 0234 07/03/22 0239 07/04/22 0327  07/05/22 0429  NA 136  --   --  134* 137 137 136  K 4.3  --   --  4.2 3.2* 4.1 3.5  CL 106  --   --  106 108 107 107  CO2 21*  --   --  17* 18* 19* 22  GLUCOSE 107*  --   --  80 94 96 94  BUN 27*  --   --  29* 28* 27* 29*  CREATININE 2.07*  --   --  1.98* 1.57* 1.42* 1.35*  CALCIUM 9.8  --   --  8.7* 9.5 9.8 9.9  AST 24  --   --   --   --   --   --   ALT 21  --   --   --   --   --   --   ALKPHOS 73  --   --   --   --   --   --   BILITOT 1.3*  --   --   --   --   --   --   ALBUMIN 2.9*  --   --   --   --   --   --   MG  --   --   --   --  1.7 1.8 1.7  CRP  --   --   --   --  34.9* 26.4* 14.5*  PROCALCITON  --   --  30.50 30.64 20.22 9.36 5.27  LATICACIDVEN 2.1* 2.0*  --   --   --   --   --   INR 2.7*  --   --   --   --   --   --   BNP  --   --   --   --  104.2* 118.4* 59.6    Total Time in preparing paper work, data evaluation and todays exam - 35 minutes  Lala Lund M.D on 07/05/2022 at 10:06 AM  [facility]

## 2022-07-05 NOTE — Care Management Important Message (Signed)
Important Message  Patient Details  Name: Esten Dollar MRN: [id number] Date of Birth: 1946/01/05   Medicare Important Message Given:  Yes  Patient left prior to IM delivery will mail the information to the patient home address.   Eliyanna Ault 07/05/2022, 4:32 PM

## 2022-07-06 NOTE — Telephone Encounter (Signed)
Transition Care Management Follow-up Telephone Call Date of discharge and from where: 07/05/22 [facility] ED. Dx: Pyelonephritis, sepsis How have you been since you were released from the hospital? I am having weakness and very tired. The infection is all cleared up, no more blood in my urine. Any questions or concerns? No  Items Reviewed: Did the pt receive and understand the discharge instructions provided? Yes  Medications obtained and verified? Yes  Other? No  Any new allergies since your discharge? No  Dietary orders reviewed? No Do you have support at home? Yes   Home Care and Equipment/Supplies: Were home health services ordered? not applicable If so, what is the name of the agency? N/a  Has the agency set up a time to come to the patient's home? not applicable Were any new equipment or medical supplies ordered?  No What is the name of the medical supply agency? N/a Were you able to get the supplies/equipment? not applicable Do you have any questions related to the use of the equipment or supplies? No  Functional Questionnaire: (I = Independent and D = Dependent) ADLs: I  Bathing/Dressing- I  Meal Prep- I  Eating- I  Maintaining continence- I  Transferring/Ambulation- I  Managing Meds- I  Follow up appointments reviewed:  PCP Hospital f/u appt confirmed? Yes  Scheduled to see Dr. Gena Fray on 07/21/22 @ 2:20pm. [facility] f/u appt confirmed? No  Scheduled to see n/a on n/a @ n/a. Are transportation arrangements needed? No  If their condition worsens, is the pt aware to call PCP or go to the Emergency Dept.? Yes Was the patient provided with contact information for the PCP's office or ED? Yes Was to pt encouraged to call back with questions or concerns? Yes  Angeline Slim, RN, BSN RN Clinical Supervisor [facility] [employer]

## 2022-07-13 ENCOUNTER — Other Ambulatory Visit (HOSPITAL_COMMUNITY): Payer: Medicare Other

## 2022-07-13 NOTE — Progress Notes (Unsigned)
Office Visit    Patient Name: [name] Date of Encounter: 07/13/2022  Primary Care Provider:  Haydee Salter, MD Primary Cardiologist:  Elouise Munroe, MD Primary Electrophysiologist: None  Chief Complaint    [name] is a 76 y.o. 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  Past Medical History    Past Medical History:  Diagnosis Date   Arthritis    knees. back   Bladder cancer ([place])    2018   High cholesterol    Hypertension    Hypothyroidism    prostate cancer    2000, 2003   Thyroid disease    Past Surgical History:  Procedure Laterality Date   EYE SURGERY Bilateral 2014   HERNIA REPAIR Right 1984   inguinal   IR RADIOLOGIST EVAL & MGMT  03/19/2021   KNEE ARTHROSCOPY Left 1966   LUMBAR LAMINECTOMY/DECOMPRESSION MICRODISCECTOMY N/A 05/13/2022   Procedure: Microlumbar laminectomy Lumbar three-four , Lumbar four-five;  Surgeon: Susa Day, MD;  Location: [facility];  Service: Orthopedics;  Laterality: N/A;   PENILE PROSTHESIS IMPLANT  2014   TOTAL KNEE ARTHROPLASTY Right 08/11/2020   Procedure: TOTAL KNEE ARTHROPLASTY;  Surgeon: Gaynelle Arabian, MD;  Location: [facility];  Service: Orthopedics;  Laterality: Right;  109mn   TRANSURETHRAL RESECTION OF BLADDER TUMOR  2018    Allergies  No Known Allergies  History of Present Illness    [name] is a 76year old male with the above mention past medical history who presents today for posthospital follow-up.  [name] seen by Dr. AMargaretann Lovelesson 01/2021 by referral from PCP for complaint of dyspnea on exertion and fatigue.  He noted shortness of breath and severe fatigue following a UTI that occurred in 12/2020.  He underwent a 2D echo that revealed EF of 60-65%, no RWMA, grade 1 DD, mild systolic RV function, with trivial MV.  Patient underwent CT of the chest to rule out possible pulmonary  embolism that was normal but did reveal incidental findings of hepatic abscess.  This was treated with I&D and dyspnea on exertion was resolved.  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  CT results revealed calcium score of 478 and mild aortic valve calcification with nonobstructive CAD.    He was last seen by Dr. AMargaretann Lovelesson 05/2022 for follow-up.  It was determined that patient's shortness of breath was related to deconditioning.  He was encouraged to start an exercise program.  He was noted to be bradycardic at his follow up and carvedilol was decreased.  He was hospitalized on 07/01/2022 with shortness of breath, hematuria and found to have pyelonephritis with sepsis.  2D echo completed that showed EF of 55% with no RWMA, mild reduced RV function mildly enlarged RV and pulmonary artery pressure of 14.8 mmHg and the mild MV regurgitation and severe calcification of the AV valve.  [name] today for posthospital follow-up alone.  He has been doing well with no new cardiac complaints.  He is euvolemic on exam today and reports continued fatigue since his discharge.  He does note some shortness of breath with extreme exertion that he feels is related to deconditioning.  He denies any dizziness or increased palpitations.  His blood pressure today was well controlled at 120/78.  In reviewing his medications he was found to be taking  his carvedilol 25 mg once a day.  I informed him that this may be contributing to his fatigue and he will now take 12.5 mg twice daily.  During his visit we reviewed the pathophysiology of heart failure and discussed the signs and symptoms of any progression that may indicate worsening heart failure. Patient denies chest pain, palpitations, dyspnea, PND, orthopnea, nausea, vomiting, dizziness, syncope, edema, weight gain, or early satiety.   Home Medications    Current Outpatient Medications   Medication Sig Dispense Refill   allopurinol (ZYLOPRIM) 100 MG tablet TAKE 1 TABLET(100 MG) BY MOUTH TWICE DAILY (Patient taking differently: Take 100 mg by mouth 2 (two) times daily. TAKE 1 TABLET(100 MG) BY MOUTH TWICE DAILY) 180 tablet 3   carvedilol (COREG) 25 MG tablet TAKE 1 TABLET(25 MG) BY MOUTH TWICE DAILY (Patient taking differently: Take 25 mg by mouth 2 (two) times daily with a meal. TAKE 1 TABLET(25 MG) BY MOUTH TWICE DAILY) 180 tablet 2   ciprofloxacin (CIPRO) 500 MG tablet Take 1 tablet (500 mg total) by mouth 2 (two) times daily. 20 tablet 0   Coenzyme Q10 (COQ10 PO) Take 1 Dose by mouth daily. Liquid form     folic acid (FOLVITE) 277 MCG tablet Take 800 mcg by mouth daily.     levothyroxine (SYNTHROID) 88 MCG tablet TAKE 1 TABLET(88 MCG) BY MOUTH DAILY BEFORE BREAKFAST (Patient taking differently: Take 88 mcg by mouth daily before breakfast.) 90 tablet 1   OVER THE COUNTER MEDICATION Take 2 capsules by mouth daily. Brain Supplement     OVER THE COUNTER MEDICATION Take 2 tablets by mouth daily. Flush ( for toenail fungus)     polyethylene glycol (MIRALAX / GLYCOLAX) 17 g packet Take 17 g by mouth daily. (Patient not taking: Reported on 07/01/2022) 14 each 0   Prenatal Vit-Fe Fum-FA-Omega (VIVA DHA PO) Take 1 tablet by mouth daily.     Probiotic Product (PROBIOTIC GUMMIES PO) Take 1 tablet by mouth daily.     RIVAROXABAN (XARELTO) VTE STARTER PACK (15 & 20 MG) Follow package directions: Take one '15mg'$  tablet by mouth twice a day. On day 22, switch to one '20mg'$  tablet once a day. Take with food. 51 each 0   rosuvastatin (CRESTOR) 10 MG tablet TAKE 1 TABLET(10 MG) BY MOUTH DAILY (Patient taking differently: Take 10 mg by mouth daily. TAKE 1 TABLET(10 MG) BY MOUTH DAILY) 90 tablet 3   No current facility-administered medications for this visit.     Review of Systems  Please see the history of present illness.    (+) Fatigue (+) Shortness of breath with exertion  All other systems  reviewed and are otherwise negative except as noted above.  Physical Exam    Wt Readings from Last 3 Encounters:  07/01/22 234 lb (106.1 kg)  06/28/22 234 lb 6.4 oz (106.3 kg)  06/08/22 235 lb 4.8 oz (106.7 kg)   AJ:OINOM were no vitals filed for this visit.,There is no height or weight on file to calculate BMI.  Constitutional:      Appearance: Healthy appearance. Not in distress.  Neck:     Vascular: JVD normal.  Pulmonary:     Effort: Pulmonary effort is normal.     Breath sounds: No wheezing. No rales. Diminished in the bases Cardiovascular:     Normal rate. Regular rhythm. Normal S1. Normal S2.      Murmurs: 2/6 systolic murmur Edema:    Trace peripheral edema and right lower extremity Abdominal:  Palpations: Abdomen is soft non tender. There is no hepatomegaly.  Skin:    General: Skin is warm and dry.  Neurological:     General: No focal deficit present.     Mental Status: Alert and oriented to person, place and time.     Cranial Nerves: Cranial nerves are intact.  EKG/LABS/Other Studies Reviewed    ECG personally reviewed by me today -none completed today  Lab Results  Component Value Date   WBC 6.5 07/05/2022   HGB 12.0 (L) 07/05/2022   HCT 34.6 (L) 07/05/2022   MCV 88.0 07/05/2022   PLT 155 07/05/2022   Lab Results  Component Value Date   CREATININE 1.35 (H) 07/05/2022   BUN 29 (H) 07/05/2022   NA 136 07/05/2022   K 3.5 07/05/2022   CL 107 07/05/2022   CO2 22 07/05/2022   Lab Results  Component Value Date   ALT 21 07/01/2022   AST 24 07/01/2022   ALKPHOS 73 07/01/2022   BILITOT 1.3 (H) 07/01/2022   Lab Results  Component Value Date   CHOL 136 07/17/2021   HDL 43.30 07/17/2021   LDLCALC 56 07/17/2021   LDLDIRECT 81 01/11/2020   TRIG 187.0 (H) 07/17/2021   CHOLHDL 3 07/17/2021    Lab Results  Component Value Date   HGBA1C 6.1 07/17/2021    Assessment & Plan    1.  Nonobstructive CAD: -Cardiac CTA completed and revealed calcium  score of 478 and mild aortic valve calcification with nonobstructive CAD -Patient currently on Crestor 10 mg and not on ASA due to Xarelto. -Denies any chest pain, palpitations,, shortness of breath today  2.  Dyspnea on exertion: -Patient was recently admitted for sepsis following pyelonephritis -2D echo was completed and revealed decreased RV function with slight enlargement. -We will check BNP today to evaluate for CHF exacerbation -He is euvolemic on exam today -He endorses chronic shortness of breath but no acute shortness of breath with minimal exertion -Repeat 2D echo in 3 months to evaluate RV function  3.  Hyperlipidemia: -Patient's last LDL was 74 on 10/2020 -Continue statin as noted above  4.  Essential hypertension: -Patient's blood pressure today was well controlled at 120/78 -Patient was taking carvedilol 25 mg once daily and was advised to change to 12.5 mg twice daily to help with fatigue and bradycardia.  Disposition: Follow-up with Elouise Munroe, MD or APP in 6 months   Medication Adjustments/Labs and Tests Ordered: Current medicines are reviewed at length with the patient today.  Concerns regarding medicines are outlined above.   Signed, Mable Fill, Marissa Nestle, NP 07/13/2022, 6:59 PM [facility]  Note:  This document was prepared using Dragon voice recognition software and may include unintentional dictation errors.

## 2022-07-14 ENCOUNTER — Encounter: Payer: Self-pay | Admitting: Nurse Practitioner

## 2022-07-14 ENCOUNTER — Ambulatory Visit (INDEPENDENT_AMBULATORY_CARE_PROVIDER_SITE_OTHER): Payer: Medicare Other | Admitting: Nurse Practitioner

## 2022-07-14 VITALS — BP 126/74 | HR 61 | Temp 97.1°F | Ht 72.0 in | Wt 226.2 lb

## 2022-07-14 DIAGNOSIS — R0609 Other forms of dyspnea: Secondary | ICD-10-CM

## 2022-07-14 DIAGNOSIS — I700 Atherosclerosis of aorta: Secondary | ICD-10-CM | POA: Diagnosis not present

## 2022-07-14 DIAGNOSIS — E039 Hypothyroidism, unspecified: Secondary | ICD-10-CM | POA: Diagnosis not present

## 2022-07-14 DIAGNOSIS — Z87448 Personal history of other diseases of urinary system: Secondary | ICD-10-CM | POA: Diagnosis not present

## 2022-07-14 DIAGNOSIS — F419 Anxiety disorder, unspecified: Secondary | ICD-10-CM

## 2022-07-14 DIAGNOSIS — E559 Vitamin D deficiency, unspecified: Secondary | ICD-10-CM | POA: Diagnosis not present

## 2022-07-14 DIAGNOSIS — I10 Essential (primary) hypertension: Secondary | ICD-10-CM

## 2022-07-14 DIAGNOSIS — F32A Depression, unspecified: Secondary | ICD-10-CM

## 2022-07-14 DIAGNOSIS — R0989 Other specified symptoms and signs involving the circulatory and respiratory systems: Secondary | ICD-10-CM

## 2022-07-14 DIAGNOSIS — R5383 Other fatigue: Secondary | ICD-10-CM

## 2022-07-14 LAB — COMPREHENSIVE METABOLIC PANEL
ALT: 21 U/L (ref 0–53)
AST: 22 U/L (ref 0–37)
Albumin: 3.6 g/dL (ref 3.5–5.2)
Alkaline Phosphatase: 86 U/L (ref 39–117)
BUN: 15 mg/dL (ref 6–23)
CO2: 26 mEq/L (ref 19–32)
Calcium: 10 mg/dL (ref 8.4–10.5)
Chloride: 106 mEq/L (ref 96–112)
Creatinine, Ser: 1.1 mg/dL (ref 0.40–1.50)
GFR: 65.24 mL/min (ref >60.00)
Glucose, Bld: 77 mg/dL (ref 70–99)
Potassium: 4.3 mEq/L (ref 3.5–5.1)
Sodium: 139 mEq/L (ref 135–145)
Total Bilirubin: 0.5 mg/dL (ref 0.2–1.2)
Total Protein: 6.7 g/dL (ref 6.0–8.3)

## 2022-07-14 LAB — CBC WITH DIFFERENTIAL/PLATELET
Basophils Absolute: 0 10*3/uL (ref 0.0–0.1)
Basophils Relative: 0.7 % (ref 0.0–3.0)
Eosinophils Absolute: 0.1 10*3/uL (ref 0.0–0.7)
Eosinophils Relative: 0.9 % (ref 0.0–5.0)
HCT: 41.4 % (ref 39.0–52.0)
Hemoglobin: 13.7 g/dL (ref 13.0–17.0)
Lymphocytes Relative: 23.5 % (ref 12.0–46.0)
Lymphs Abs: 1.6 10*3/uL (ref 0.7–4.0)
MCHC: 33.2 g/dL (ref 30.0–36.0)
MCV: 90.7 fl (ref 78.0–100.0)
Monocytes Absolute: 0.6 10*3/uL (ref 0.1–1.0)
Monocytes Relative: 8.8 % (ref 3.0–12.0)
Neutro Abs: 4.4 10*3/uL (ref 1.4–7.7)
Neutrophils Relative %: 66.1 % (ref 43.0–77.0)
Platelets: 346 10*3/uL (ref 150.0–400.0)
RBC: 4.56 Mil/uL (ref 4.22–5.81)
RDW: 16 % — ABNORMAL HIGH (ref 11.5–15.5)
WBC: 6.7 10*3/uL (ref 4.0–10.5)

## 2022-07-14 LAB — BRAIN NATRIURETIC PEPTIDE: Pro B Natriuretic peptide (BNP): 61 pg/mL (ref 0.0–100.0)

## 2022-07-14 LAB — VITAMIN D 25 HYDROXY (VIT D DEFICIENCY, FRACTURES): VITD: 31.19 ng/mL (ref 30.00–100.00)

## 2022-07-14 MED ORDER — LORATADINE 10 MG PO TABS: 10.0000 mg | ORAL_TABLET | Freq: Every day | ORAL | 2 refills | Status: DC

## 2022-07-14 MED ORDER — CITALOPRAM HYDROBROMIDE 10 MG PO TABS: 10.0000 mg | ORAL_TABLET | Freq: Every day | ORAL | 2 refills | Status: DC

## 2022-07-14 NOTE — Patient Instructions (Addendum)
It was great to see you!  Start celexa 1 tablet daily, day or night, with or without food for your depression and anxiety.   We are checking your labs today and will let you know the results via mychart/phone.   Start loratadine (claritin) 1 tablet daily to see if this helps your throat clearing.   Keep your appointment with cardiology tomorrow.   Let's follow-up in 4-6 weeks, sooner if you have concerns.  If a referral was placed today, you will be contacted for an appointment. Please note that routine referrals can sometimes take up to 3-4 weeks to process. Please call our office if you haven't heard anything after this time frame.  Take care,  Vance Peper, NP

## 2022-07-14 NOTE — Progress Notes (Unsigned)
Established Patient Office Visit  Subjective   Patient ID: Khale Nigh, male    DOB: 24-Jun-1946  Age: 76 y.o. MRN: [id number]  Chief Complaint  Patient presents with   Hospitalization [facility] f/u from 07/05/22.  Weakness, lack of stamina, SOB.     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   Transition of Care Hospital Follow up.   Hospital/Facility: D/C Physician:  D/C Date:   Records Requested:  Records Received:  Records Reviewed:   Diagnoses on Discharge:   Date of interactive Contact within 48 hours of discharge:  Contact was through: {Blank single:19197::"phone","e-mail","direct","other"}  Date of 7 day or 14 day face-to-face visit:    {Blank single:19197::"within 7 days","within 14 days"}  Outpatient Encounter Medications as of 07/14/2022  Medication Sig Note   allopurinol (ZYLOPRIM) 100 MG tablet TAKE 1 TABLET(100 MG) BY MOUTH TWICE DAILY (Patient taking differently: Take 100 mg by mouth 2 (two) times daily. TAKE 1 TABLET(100 MG) BY MOUTH TWICE DAILY)    carvedilol (COREG) 25 MG tablet TAKE 1 TABLET(25 MG) BY MOUTH TWICE DAILY (Patient taking differently: Take 25 mg by mouth 2 (two) times daily with a meal. TAKE 1 TABLET(25 MG) BY MOUTH TWICE DAILY)    Coenzyme Q10 (COQ10 PO) Take 1 Dose by mouth daily. Liquid form    folic acid (FOLVITE) 308 MCG tablet Take 800 mcg by mouth daily.    levothyroxine (SYNTHROID) 88 MCG tablet TAKE 1 TABLET(88 MCG) BY MOUTH DAILY BEFORE BREAKFAST (Patient taking differently: Take 88 mcg by mouth daily before breakfast.)    OVER THE COUNTER MEDICATION Take 2 capsules by mouth daily. Brain Supplement    OVER THE COUNTER MEDICATION Take 2 tablets by mouth daily. Flush ( for toenail fungus)    Prenatal Vit-Fe Fum-FA-Omega (VIVA DHA PO) Take 1 tablet by mouth daily.    Probiotic Product (PROBIOTIC GUMMIES PO) Take 1  tablet by mouth daily.    RIVAROXABAN (XARELTO) VTE STARTER PACK (15 & 20 MG) Follow package directions: Take one '15mg'$  tablet by mouth twice a day. On day 22, switch to one '20mg'$  tablet once a day. Take with food. 07/01/2022: Patient is on day 18/21 of the 15 mg tablets, will need to switch to the 20 mg tablets on 07/05/2022   rosuvastatin (CRESTOR) 10 MG tablet TAKE 1 TABLET(10 MG) BY MOUTH DAILY (Patient taking differently: Take 10 mg by mouth daily. TAKE 1 TABLET(10 MG) BY MOUTH DAILY)    ciprofloxacin (CIPRO) 500 MG tablet Take 1 tablet (500 mg total) by mouth 2 (two) times daily.    polyethylene glycol (MIRALAX / GLYCOLAX) 17 g packet Take 17 g by mouth daily. (Patient not taking: Reported on 07/01/2022)    No facility-administered encounter medications on file as of 07/14/2022.    Diagnostic Tests Reviewed/Disposition:   Consults:  Discharge Instructions  Disease/illness Education:  Home Health/Community Services Discussions/Referrals:  Establishment or re-establishment of referral orders for community resources:  Discussion with other health care providers:  Assessment and Support of treatment regimen adherence:  Appointments Coordinated with:   Education for self-management, independent living, and ADLs:    {History (Optional):23778}  ROS    Objective:     BP 126/74   Pulse 61   Temp (!) 97.1 F (36.2 C) (Temporal)   Ht 6' (1.829 m)   Wt 226 lb 3.2 oz (102.6 kg)   SpO2  98%   BMI 30.68 kg/m  {Vitals History (Optional):23777}  Physical Exam   No results found for any visits on 07/14/22.  {Labs (Optional):23779}  The 10-year ASCVD risk score (Arnett DK, et al., 2019) is: 27.6%    Assessment & Plan:   Problem List Items Addressed This Visit   None   No follow-ups on file.    Charyl Dancer, NP

## 2022-07-15 ENCOUNTER — Encounter: Payer: Self-pay | Admitting: Nurse Practitioner

## 2022-07-15 ENCOUNTER — Ambulatory Visit: Payer: Medicare Other | Attending: Nurse Practitioner | Admitting: Nurse Practitioner

## 2022-07-15 VITALS — BP 120/78 | HR 64 | Ht 72.0 in | Wt 228.0 lb

## 2022-07-15 DIAGNOSIS — R0609 Other forms of dyspnea: Secondary | ICD-10-CM | POA: Insufficient documentation

## 2022-07-15 DIAGNOSIS — E785 Hyperlipidemia, unspecified: Secondary | ICD-10-CM | POA: Diagnosis not present

## 2022-07-15 DIAGNOSIS — Z87448 Personal history of other diseases of urinary system: Secondary | ICD-10-CM | POA: Insufficient documentation

## 2022-07-15 DIAGNOSIS — I700 Atherosclerosis of aorta: Secondary | ICD-10-CM | POA: Diagnosis not present

## 2022-07-15 DIAGNOSIS — F32A Depression, unspecified: Secondary | ICD-10-CM | POA: Insufficient documentation

## 2022-07-15 DIAGNOSIS — I10 Essential (primary) hypertension: Secondary | ICD-10-CM | POA: Insufficient documentation

## 2022-07-15 LAB — IRON,TIBC AND FERRITIN PANEL
%SAT: 28 % (calc) (ref 20–48)
Ferritin: 332 ng/mL (ref 24–380)
Iron: 74 ug/dL (ref 50–180)
TIBC: 269 mcg/dL (calc) (ref 250–425)

## 2022-07-15 MED ORDER — CARVEDILOL 12.5 MG PO TABS: 12.5000 mg | ORAL_TABLET | Freq: Two times a day (BID) | ORAL | 2 refills | Status: DC

## 2022-07-15 NOTE — Patient Instructions (Addendum)
Medication Instructions:  DECREASE Coreg to 12.'5mg'$  Take 1 tablet twice a day *If you need a refill on your cardiac medications before your next appointment, please call your pharmacy*   Lab Work: TODAY-BNP  If you have labs (blood work) drawn today and your tests are completely normal, you will receive your results only by: [place] (if you have MyChart) OR A paper copy in the mail If you have any lab test that is abnormal or we need to change your treatment, we will call you to review the results.   Testing/Procedures: Your physician has requested that you have an echocardiogram. Echocardiography is a painless test that uses sound waves to create images of your heart. It provides your doctor with information about the size and shape of your heart and how well your heart's chambers and valves are working. This procedure takes approximately one hour. There are no restrictions for this procedure. Please do NOT wear cologne, perfume, aftershave, or lotions (deodorant is allowed). Please arrive 15 minutes prior to your appointment time. SCHEDULE REPEAT ECHO IN 3 MONTHS   Follow-Up: At [facility], you and your health needs are our priority.  As part of our continuing mission to provide you with exceptional heart care, we have created designated Provider Care Teams.  These Care Teams include your primary Cardiologist (physician) and Advanced Practice Providers (APPs -  Physician Assistants and Nurse Practitioners) who all work together to provide you with the care you need, when you need it.  We recommend signing up for the patient portal called "MyChart".  Sign up information is provided on this After Visit Summary.  MyChart is used to connect with patients for Virtual Visits (Telemedicine).  Patients are able to view lab/test results, encounter notes, upcoming appointments, etc.  Non-urgent messages can be sent to your provider as well.   To learn more about what you can do with  MyChart, go to [email or web address].    Your next appointment:   6 MONTHS  The format for your next appointment:   In Person  Provider:   Elouise Munroe, MD     Other Instructions CHECK YOUR BLOOD DAILY AND CONTACT THE OFFICE IN 1-2 WEEKS WITH BLOOD PRESSURE READINGS  Important Information About Sugar

## 2022-07-15 NOTE — Assessment & Plan Note (Signed)
He is still experiencing some shortness of breath on exertion.  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  Did not show any signs of diastolic heart failure.  His BNP was elevated in the hospital and he received a dose of IV Lasix.  We will recheck BNP today and encouraged him to keep his appointment with cardiology tomorrow.

## 2022-07-15 NOTE — Assessment & Plan Note (Signed)
Chronic, stable.  BP today 126/74.  Continue carvedilol 12.5 mg twice daily.  We will recheck CMP and CBC today.

## 2022-07-15 NOTE — Assessment & Plan Note (Signed)
Noted on imaging on 07/01/2022.  Continue rosuvastatin 10 mg daily.

## 2022-07-15 NOTE — Assessment & Plan Note (Signed)
He was recently hospitalized on 11/2-11/6/23 for pyelonephritis with sepsis.  He was treated with IV antibiotics and discharged on Cipro.  He states that he is about done with this antibiotic.  He is not having any more blood in his urine.  He had a CT scan on 07/01/2022 which also showed possible kidney stone that was passed.  Encouraged him to complete his antibiotics and to drink plenty of fluids.  We will recheck CMP, CBC today.  Medication reconciliation completed after hospital discharge.

## 2022-07-15 NOTE — Assessment & Plan Note (Signed)
He has been experiencing some anxiety and depression over the past few months.  It worsened recently with this hospitalization.  He is having crying spells at home and worrying about various different things.  His PHQ-9 is a 12 and his GAD-7 is a 9.  He denies SI/HI.  He is interested in starting medication today, we will start him on Celexa 10 mg daily.  Discussed possible side effects.  Follow-up with PCP in 4 to 6 weeks.

## 2022-07-15 NOTE — Assessment & Plan Note (Signed)
Chronic, stable.  Continue levothyroxine 88 mcg daily.  Last TSH was within normal limits.

## 2022-07-16 ENCOUNTER — Other Ambulatory Visit: Payer: Self-pay

## 2022-07-16 LAB — PRO B NATRIURETIC PEPTIDE: NT-Pro BNP: 160 pg/mL (ref 0–486)

## 2022-07-16 MED ORDER — RIVAROXABAN 20 MG PO TABS: 20.0000 mg | ORAL_TABLET | Freq: Every day | ORAL | 2 refills | Status: DC

## 2022-07-16 NOTE — Telephone Encounter (Signed)
Pt called stating that he had reached out to [employer] for financial assistance and the pharmacy informed him that he did not have an active prescription for Xarelto. Pt states that he was only prescribed the starter pack and he has 5 more pills remaining.  Reviewed pt's chart, returned call for clarification, two identifiers used. Informed pt that according to the POC, he would remain on Xarelto 20 mg daily for at least 3 months. Confirmed pharmacy, prescription sent. Enough refills to last through next office visit. Confirmed understanding.

## 2022-07-21 ENCOUNTER — Inpatient Hospital Stay: Payer: Medicare Other | Admitting: Family Medicine

## 2022-08-11 ENCOUNTER — Encounter: Payer: Self-pay | Admitting: Family Medicine

## 2022-08-11 ENCOUNTER — Ambulatory Visit (INDEPENDENT_AMBULATORY_CARE_PROVIDER_SITE_OTHER): Payer: Medicare Other | Admitting: Family Medicine

## 2022-08-11 VITALS — BP 130/70 | HR 52 | Temp 97.0°F | Ht 72.0 in | Wt 236.2 lb

## 2022-08-11 DIAGNOSIS — F419 Anxiety disorder, unspecified: Secondary | ICD-10-CM | POA: Diagnosis not present

## 2022-08-11 DIAGNOSIS — E669 Obesity, unspecified: Secondary | ICD-10-CM | POA: Diagnosis not present

## 2022-08-11 DIAGNOSIS — R0989 Other specified symptoms and signs involving the circulatory and respiratory systems: Secondary | ICD-10-CM | POA: Diagnosis not present

## 2022-08-11 DIAGNOSIS — F32A Depression, unspecified: Secondary | ICD-10-CM | POA: Diagnosis not present

## 2022-08-11 MED ORDER — CITALOPRAM HYDROBROMIDE 20 MG PO TABS: 20.0000 mg | ORAL_TABLET | Freq: Every day | ORAL | 5 refills | Status: DC

## 2022-08-11 NOTE — Progress Notes (Signed)
[facility] [address] [place] [address] Dept: [phone] Dept Fax: [phone]  Chronic Care Office Visit  Subjective:    Patient ID: [name], male    DOB: November 04, 1945, 76 y.o..   MRN: [id number]  Chief Complaint  Patient presents with   Follow-up    4 week f/u.  Wants to discuss weight loss med?     History of Present Illness:  Patient is in today for reassessment of chronic medical issues.  Mr. Richert had recently seen Ms. McElwee. He had been having an issue with chronic throat clearing. he was advised to take Claritin. He notes that this has seemed ot resolve the issue. He wonders about how long to stay on the medicine.  Mr. Scheel also talked with Ms. McElwee about issues he was having with depression. She started him on citalopram 10 mg daily. He is 4 weeks in and notes he cannot tell that this is having any effect for him.  Mr. Fini  has a concern about ongoing issues with his weight. He asks about weight loss medication. He has been making some dietary changes to reduce concentrated sugars and starches. He has not been counting calories or setting himself a daily calorie goal. He admits he does not exercise very much. This was partly due to back issues he was having earlier this Fall. However, his back is doing much better since surgery.  Past Medical History: Patient Active Problem List   Diagnosis Date Noted   History of pyelonephritis 07/15/2022   Anxiety and depression 07/15/2022   Dyspnea on exertion 07/15/2022   Sepsis ([place]) 07/01/2022   Spinal stenosis at L4-L5 level 05/13/2022   Nephrolithiasis 02/27/2021   Aortic atherosclerosis ([place]) 02/27/2021   Abscess of liver 02/25/2021   Prediabetes 11/18/2020   History of total knee replacement, right 08/18/2020   Essential hypertension 01/11/2020   Onychomycosis 10/12/2019   Hypothyroidism 10/12/2019   Hyperlipidemia 10/12/2019   Radiation  cystitis 09/07/2019   Thrombocytopenia ([place]) 05/03/2019   Gout 05/03/2019   Personal history of prostate cancer 05/03/2019   Obesity (BMI 30.0-34.9) 04/26/2019   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   Past Surgical History:  Procedure Laterality Date   EYE SURGERY Bilateral 2014   HERNIA REPAIR Right 1984   inguinal   IR RADIOLOGIST EVAL & MGMT  03/19/2021   KNEE ARTHROSCOPY Left 1966   LUMBAR LAMINECTOMY/DECOMPRESSION MICRODISCECTOMY N/A 05/13/2022   Procedure: Microlumbar laminectomy Lumbar three-four , Lumbar four-five;  Surgeon: Susa Day, MD;  Location: [facility];  Service: Orthopedics;  Laterality: N/A;   PENILE PROSTHESIS IMPLANT  2014   TOTAL KNEE ARTHROPLASTY Right 08/11/2020   Procedure: TOTAL KNEE ARTHROPLASTY;  Surgeon: Gaynelle Arabian, MD;  Location: [facility];  Service: Orthopedics;  Laterality: Right;  55mn   TRANSURETHRAL RESECTION OF BLADDER TUMOR  2018   Family History  Problem Relation Age of Onset   Cancer Neg Hx    Outpatient Medications Prior to Visit  Medication Sig Dispense Refill   allopurinol (ZYLOPRIM) 100 MG tablet TAKE 1 TABLET(100 MG) BY MOUTH TWICE DAILY 180 tablet 3   carvedilol (COREG) 12.5 MG tablet Take 1 tablet (12.5 mg total) by mouth 2 (two) times daily with a meal. 60 tablet 2   cholecalciferol (VITAMIN D3) 25 MCG (1000 UNIT) tablet Take 1,000 Units by mouth daily.     Coenzyme Q10 (COQ10 PO) Take 1 Dose by mouth daily. Liquid form     folic acid (  FOLVITE) 800 MCG tablet Take 800 mcg by mouth daily.     levothyroxine (SYNTHROID) 88 MCG tablet TAKE 1 TABLET(88 MCG) BY MOUTH DAILY BEFORE BREAKFAST (Patient taking differently: Take 88 mcg by mouth daily before breakfast.) 90 tablet 1   loratadine (CLARITIN) 10 MG tablet Take 1 tablet (10 mg total) by mouth daily. 30 tablet 2   OVER THE COUNTER MEDICATION Take 2 capsules by mouth daily. Brain Supplement     OVER THE COUNTER MEDICATION Take 2 tablets by mouth daily. Flush ( for  toenail fungus)     Probiotic Product (PROBIOTIC GUMMIES PO) Take 1 tablet by mouth daily.     rivaroxaban (XARELTO) 20 MG TABS tablet Take 1 tablet (20 mg total) by mouth daily with supper. 30 tablet 2   rosuvastatin (CRESTOR) 10 MG tablet TAKE 1 TABLET(10 MG) BY MOUTH DAILY (Patient taking differently: Take 10 mg by mouth daily. TAKE 1 TABLET(10 MG) BY MOUTH DAILY) 90 tablet 3   citalopram (CELEXA) 10 MG tablet Take 1 tablet (10 mg total) by mouth daily. 30 tablet 2   No facility-administered medications prior to visit.   No Known Allergies    Objective:   Today's Vitals   08/11/22 1533  BP: 130/70  Pulse: (!) 52  Temp: (!) 97 F (36.1 C)  TempSrc: Temporal  SpO2: 98%  Weight: 236 lb 3.2 oz (107.1 kg)  Height: 6' (1.829 m)   Body mass index is 32.03 kg/m.   General: Well developed, well nourished. No acute distress. Psych: Alert and oriented. Normal mood and affect.  Health Maintenance Due  Topic Date Due   Hepatitis C Screening  Never done   DTaP/Tdap/Td (1 - Tdap) Never done   Zoster Vaccines- Shingrix (1 of 2) Never done     Assessment & Plan:   1. Anxiety and depression I will try increasing the dose on Mr. Krol' citalopram. I recommend we give 6 weeks and reassess.  - citalopram (CELEXA) 20 MG tablet; Take 1 tablet (20 mg total) by mouth daily.  Dispense: 30 tablet; Refill: 5  2. Throat clearing Improved. Continue Claritin as needed.  3. Obesity (BMI 30.0-34.9) Discussed importance of having a base of a low calorie, sustainable diet and regular exercise. Reviewed BMR and need to establish a calorie deficit. Discussed role of medication as an adjunct to proper diet and exercise. He will try and make improvements in this and we will readdress medication at a later date.   Return in about 6 weeks (around 09/22/2022) for Reassessment.   Haydee Salter, MD

## 2022-09-13 ENCOUNTER — Ambulatory Visit: Payer: Medicare Other

## 2022-09-13 ENCOUNTER — Ambulatory Visit (HOSPITAL_COMMUNITY): Payer: Medicare Other

## 2022-09-22 ENCOUNTER — Ambulatory Visit (INDEPENDENT_AMBULATORY_CARE_PROVIDER_SITE_OTHER): Payer: Medicare Other | Admitting: Family Medicine

## 2022-09-22 ENCOUNTER — Encounter: Payer: Self-pay | Admitting: Family Medicine

## 2022-09-22 VITALS — BP 134/78 | HR 51 | Temp 97.0°F | Ht 72.0 in | Wt 239.6 lb

## 2022-09-22 DIAGNOSIS — F419 Anxiety disorder, unspecified: Secondary | ICD-10-CM

## 2022-09-22 DIAGNOSIS — R251 Tremor, unspecified: Secondary | ICD-10-CM

## 2022-09-22 DIAGNOSIS — G629 Polyneuropathy, unspecified: Secondary | ICD-10-CM | POA: Diagnosis not present

## 2022-09-22 DIAGNOSIS — F32A Depression, unspecified: Secondary | ICD-10-CM

## 2022-09-22 DIAGNOSIS — Z1159 Encounter for screening for other viral diseases: Secondary | ICD-10-CM | POA: Diagnosis not present

## 2022-09-22 LAB — COMPREHENSIVE METABOLIC PANEL
ALT: 16 U/L (ref 0–53)
AST: 20 U/L (ref 0–37)
Albumin: 3.9 g/dL (ref 3.5–5.2)
Alkaline Phosphatase: 59 U/L (ref 39–117)
BUN: 23 mg/dL (ref 6–23)
CO2: 25 mEq/L (ref 19–32)
Calcium: 10.2 mg/dL (ref 8.4–10.5)
Chloride: 107 mEq/L (ref 96–112)
Creatinine, Ser: 0.96 mg/dL (ref 0.40–1.50)
GFR: 76.71 mL/min (ref >60.00)
Glucose, Bld: 92 mg/dL (ref 70–99)
Potassium: 4.1 mEq/L (ref 3.5–5.1)
Sodium: 139 mEq/L (ref 135–145)
Total Bilirubin: 0.3 mg/dL (ref 0.2–1.2)
Total Protein: 6.6 g/dL (ref 6.0–8.3)

## 2022-09-22 LAB — CBC
HCT: 42.8 % (ref 39.0–52.0)
Hemoglobin: 14.3 g/dL (ref 13.0–17.0)
MCHC: 33.6 g/dL (ref 30.0–36.0)
MCV: 91.5 fl (ref 78.0–100.0)
Platelets: 170 10*3/uL (ref 150.0–400.0)
RBC: 4.67 Mil/uL (ref 4.22–5.81)
RDW: 16.4 % — ABNORMAL HIGH (ref 11.5–15.5)
WBC: 4.5 10*3/uL (ref 4.0–10.5)

## 2022-09-22 LAB — T4, FREE: Free T4: 1 ng/dL (ref 0.60–1.60)

## 2022-09-22 LAB — SEDIMENTATION RATE: Sed Rate: 31 mm/hr — ABNORMAL HIGH (ref 0–20)

## 2022-09-22 LAB — TSH: TSH: 1.3 u[IU]/mL (ref 0.35–5.50)

## 2022-09-22 LAB — VITAMIN B12: Vitamin B-12: 339 pg/mL (ref 211–911)

## 2022-09-22 MED ORDER — GABAPENTIN 300 MG PO CAPS: ORAL_CAPSULE | ORAL | 3 refills | Status: DC

## 2022-09-22 NOTE — Assessment & Plan Note (Signed)
Improved with higher dose of citalopram. Plan to continue citalopram 20 mg daily.

## 2022-09-22 NOTE — Assessment & Plan Note (Signed)
Appears to be a benign intention tremor. He is not using caffeine, but did recommend ongoing avoidance. We will check labs to screen for possible causes of this. I reassured him that the tremor is not consistent with Parkinson's disease. As this tremor is mild, we will observe for now.

## 2022-09-22 NOTE — Progress Notes (Signed)
[facility] [address] [place] [address] Dept: [phone] Dept Fax: [phone]  Chronic Care Office Visit  Subjective:    Patient ID: [name], male    DOB: 26-Dec-1945, 77 y.o..   MRN: [id number]  Chief Complaint  Patient presents with   Follow-up    6 week f/u.  C/o having pains in feet x years.  Has been taking Tylenol.     History of Present Illness:  Patient is in today for reassessment of chronic medical issues.  [name] has a history of anxiety and depression. This had been more noticeable this winter. We have been working to escalate his dose of citalopram, having gone up to 20 mg daily at his last appointment. [name] feels his mood is doing much better.  [name] notes he has a history of painful neuropathy for about 5 years. He has been seen by several podiatrist about this in the past. He has been managing this with Tylenol 2 tabs twice a day. This does help to blunt the pain, but does not cover a full day. He describes the pain as burning and involving the feet. He does use some orthotic inserts in his shoes, which has helped.  [name] has noted more recently a tremor he has developed in his right hand. This occurs primarily with actions such as holding a glass or pouring something with this hand. he has not noted nay resting tremor.   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   Thrombocytopenia ([place]) 05/03/2019   Gout 05/03/2019   Personal history of prostate cancer 05/03/2019   Obesity (BMI 30.0-34.9) 04/26/2019   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   Past Surgical History:  Procedure Laterality Date   EYE SURGERY Bilateral 2014   HERNIA REPAIR Right 1984   inguinal   IR RADIOLOGIST EVAL & MGMT  03/19/2021   KNEE ARTHROSCOPY Left 1966   LUMBAR LAMINECTOMY/DECOMPRESSION MICRODISCECTOMY N/A 05/13/2022   Procedure: Microlumbar laminectomy Lumbar three-four , Lumbar four-five;  Surgeon: Susa Day, MD;  Location: [facility];  Service: Orthopedics;  Laterality: N/A;   PENILE PROSTHESIS IMPLANT  2014   TOTAL KNEE ARTHROPLASTY Right 08/11/2020   Procedure: TOTAL KNEE ARTHROPLASTY;  Surgeon: Gaynelle Arabian, MD;  Location: [facility];  Service: Orthopedics;  Laterality: Right;  31mn   TRANSURETHRAL RESECTION OF BLADDER TUMOR  2018   Family History  Problem Relation Age of Onset   Cancer Neg Hx    Outpatient Medications Prior to Visit  Medication Sig Dispense Refill   allopurinol (ZYLOPRIM) 100 MG tablet TAKE 1 TABLET(100 MG) BY MOUTH TWICE DAILY 180 tablet 3   carvedilol (COREG) 12.5 MG tablet Take 1 tablet (12.5 mg total) by mouth 2 (two) times daily with a meal. 60 tablet 2   cholecalciferol (VITAMIN D3) 25 MCG (1000 UNIT) tablet Take 1,000 Units by mouth daily.     citalopram (CELEXA) 20 MG tablet Take 1 tablet (20 mg  total) by mouth daily. 30 tablet 5   Coenzyme Q10 (COQ10 PO) Take 1 Dose by mouth daily. Liquid form     folic acid (FOLVITE) 664 MCG tablet Take 800 mcg by mouth daily.     levothyroxine (SYNTHROID) 88 MCG tablet TAKE 1 TABLET(88 MCG) BY MOUTH DAILY BEFORE BREAKFAST (Patient taking differently: Take 88 mcg by mouth daily before breakfast.) 90 tablet 1   loratadine (CLARITIN) 10 MG tablet Take 1 tablet (10 mg total) by mouth daily. 30 tablet 2   OVER THE COUNTER MEDICATION Take 2 capsules by mouth  daily. Brain Supplement     OVER THE COUNTER MEDICATION Take 2 tablets by mouth daily. Flush ( for toenail fungus)     Probiotic Product (PROBIOTIC GUMMIES PO) Take 1 tablet by mouth daily.     rivaroxaban (XARELTO) 20 MG TABS tablet Take 1 tablet (20 mg total) by mouth daily with supper. 30 tablet 2   rosuvastatin (CRESTOR) 10 MG tablet TAKE 1 TABLET(10 MG) BY MOUTH DAILY (Patient taking differently: Take 10 mg by mouth daily. TAKE 1 TABLET(10 MG) BY MOUTH DAILY) 90 tablet 3   No facility-administered medications prior to visit.   No Known Allergies    Objective:   Today's Vitals   09/22/22 0800  BP: 134/78  Pulse: (!) 51  Temp: (!) 97 F (36.1 C)  TempSrc: Temporal  SpO2: 95%  Weight: 239 lb 9.6 oz (108.7 kg)  Height: 6' (1.829 m)   Body mass index is 32.5 kg/m.   General: Well developed, well nourished. No acute distress. Neuro: Fine tremor of both hands, R>L. No resting tremor noted. Psych: Alert and oriented. Normal mood and affect.  Health Maintenance Due  Topic Date Due   Hepatitis C Screening  Never done   DTaP/Tdap/Td (1 - Tdap) Never done   Zoster Vaccines- Shingrix (1 of 2) Never done     Assessment & Plan:   Problem List Items Addressed This Visit       Nervous and Auditory   Peripheral polyneuropathy    Etiology is unclear. I will do some lab testing to evaluate for potential causes of his painful neuropathy. I will start him on gabapentin, escalating the dose to a target of 300 mg tid. Plan to follow-up in 6 weeks.      Relevant Medications   gabapentin (NEURONTIN) 300 MG capsule   Other Relevant Orders   CBC   Comprehensive metabolic panel   TSH   T4, free   Vitamin B12   HCV Ab w Reflex to Quant PCR   Vitamin B1   Sedimentation rate     Other   Anxiety and depression - Primary    Improved with higher dose of citalopram. Plan to continue citalopram 20 mg daily.      Tremor of right hand    Appears to be a benign intention tremor. He is  not using caffeine, but did recommend ongoing avoidance. We will check labs to screen for possible causes of this. I reassured him that the tremor is not consistent with Parkinson's disease. As this tremor is mild, we will observe for now.      Relevant Orders   Comprehensive metabolic panel   TSH   T4, free   Other Visit Diagnoses     Encounter for hepatitis C screening test for low risk patient       Relevant Orders   HCV Ab w Reflex to Quant PCR  Return in about 6 weeks (around 11/03/2022) for Reassessment.   Haydee Salter, MD

## 2022-09-22 NOTE — Assessment & Plan Note (Signed)
Etiology is unclear. I will do some lab testing to evaluate for potential causes of his painful neuropathy. I will start him on gabapentin, escalating the dose to a target of 300 mg tid. Plan to follow-up in 6 weeks.

## 2022-09-23 LAB — HCV AB W REFLEX TO QUANT PCR: HCV Ab: NONREACTIVE

## 2022-09-23 LAB — HCV INTERPRETATION

## 2022-09-24 ENCOUNTER — Other Ambulatory Visit: Payer: Self-pay | Admitting: Family Medicine

## 2022-09-24 DIAGNOSIS — E785 Hyperlipidemia, unspecified: Secondary | ICD-10-CM

## 2022-09-26 ENCOUNTER — Other Ambulatory Visit: Payer: Self-pay | Admitting: Family Medicine

## 2022-09-26 DIAGNOSIS — E785 Hyperlipidemia, unspecified: Secondary | ICD-10-CM

## 2022-09-27 LAB — VITAMIN B1: Vitamin B1 (Thiamine): 52 nmol/L — ABNORMAL HIGH (ref 8–30)

## 2022-10-05 ENCOUNTER — Telehealth: Payer: Medicare Other | Admitting: Family Medicine

## 2022-10-20 ENCOUNTER — Ambulatory Visit (HOSPITAL_COMMUNITY): Payer: Medicare Other | Attending: Nurse Practitioner

## 2022-10-20 DIAGNOSIS — I700 Atherosclerosis of aorta: Secondary | ICD-10-CM | POA: Diagnosis present

## 2022-10-20 DIAGNOSIS — I10 Essential (primary) hypertension: Secondary | ICD-10-CM | POA: Diagnosis not present

## 2022-10-20 LAB — ECHOCARDIOGRAM COMPLETE
AR max vel: 1.88 cm2
AV Area VTI: 1.89 cm2
AV Area mean vel: 1.75 cm2
AV Mean grad: 13 mmHg
AV Peak grad: 22.3 mmHg
Ao pk vel: 2.36 m/s
Area-P 1/2: 2.97 cm2
S' Lateral: 2.8 cm

## 2022-10-27 ENCOUNTER — Other Ambulatory Visit: Payer: Self-pay | Admitting: Family Medicine

## 2022-10-27 ENCOUNTER — Other Ambulatory Visit: Payer: Self-pay | Admitting: Nurse Practitioner

## 2022-10-27 DIAGNOSIS — E039 Hypothyroidism, unspecified: Secondary | ICD-10-CM

## 2022-11-10 ENCOUNTER — Ambulatory Visit (INDEPENDENT_AMBULATORY_CARE_PROVIDER_SITE_OTHER): Payer: Medicare Other | Admitting: Family Medicine

## 2022-11-10 ENCOUNTER — Encounter: Payer: Self-pay | Admitting: Family Medicine

## 2022-11-10 VITALS — BP 128/70 | HR 48 | Temp 97.3°F | Ht 72.0 in | Wt 242.2 lb

## 2022-11-10 DIAGNOSIS — I10 Essential (primary) hypertension: Secondary | ICD-10-CM | POA: Diagnosis not present

## 2022-11-10 DIAGNOSIS — E039 Hypothyroidism, unspecified: Secondary | ICD-10-CM

## 2022-11-10 DIAGNOSIS — F419 Anxiety disorder, unspecified: Secondary | ICD-10-CM | POA: Diagnosis not present

## 2022-11-10 DIAGNOSIS — E785 Hyperlipidemia, unspecified: Secondary | ICD-10-CM | POA: Diagnosis not present

## 2022-11-10 DIAGNOSIS — F32A Depression, unspecified: Secondary | ICD-10-CM

## 2022-11-10 MED ORDER — CITALOPRAM HYDROBROMIDE 20 MG PO TABS: 20.0000 mg | ORAL_TABLET | Freq: Every day | ORAL | 3 refills | Status: DC

## 2022-11-10 NOTE — Progress Notes (Signed)
[facility] [address] [place] [address] Dept: [phone] Dept Fax: [phone]  Chronic Care Office Visit  Subjective:    Patient ID: [name], male    DOB: June 22, 1946, 77 y.o..   MRN: [id number]  Chief Complaint  Patient presents with   Medical Management of Chronic Issues    6 week f/u .  No concerns.      History of Present Illness:  Patient is in today for reassessment of chronic medical issues.  Mr. Nordhoff has a history of hypertension. He is managed on carvedilol 12.5 mg bid.    Mr. Strenger has a history of hypothyroidism. He is managed on levothyroxine 88 mcg daily.   Mr. Klus has a history of hyperlipidemia. He is managed on rosuvastatin 10 mg daily.  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 He is managed on rivaroxaban 20 mg daily.  Mr. Sandberg has a history of anxiety and depression. This Fall/Winter, he had been noticing increased issues with depressed mood. He was started back on citalopram 10 mg daily. He notes his mood had been doing better. However, he is noting some work issues that are currently creating some stressors for him. Overall, he is managing with this.  Past Medical History: Patient Active Problem List   Diagnosis Date Noted   Peripheral polyneuropathy 09/22/2022   Tremor of right hand 09/22/2022   History of pyelonephritis 07/15/2022   Anxiety and depression 07/15/2022   Dyspnea on exertion 07/15/2022   Sepsis ([place]) 07/01/2022   Spinal stenosis at L4-L5 level 05/13/2022   Nephrolithiasis 02/27/2021   Aortic atherosclerosis ([place]) 02/27/2021   Abscess of liver 02/25/2021   Prediabetes 11/18/2020   History of total knee replacement, right 08/18/2020   Essential hypertension 01/11/2020   Onychomycosis 10/12/2019   Hypothyroidism 10/12/2019   Hyperlipidemia 10/12/2019   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   Thrombocytopenia ([place]) 05/03/2019   Gout 05/03/2019   Personal history  of prostate cancer 05/03/2019   Obesity (BMI 30.0-34.9) 04/26/2019   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   Past Surgical History:  Procedure Laterality Date   EYE SURGERY Bilateral 2014   HERNIA REPAIR Right 1984   inguinal   IR RADIOLOGIST EVAL & MGMT  03/19/2021   KNEE ARTHROSCOPY Left 1966   LUMBAR LAMINECTOMY/DECOMPRESSION MICRODISCECTOMY N/A 05/13/2022   Procedure: Microlumbar laminectomy Lumbar three-four , Lumbar four-five;  Surgeon: Susa Day, MD;  Location: [facility];  Service: Orthopedics;  Laterality: N/A;   PENILE PROSTHESIS IMPLANT  2014   TOTAL KNEE ARTHROPLASTY Right 08/11/2020   Procedure: TOTAL KNEE ARTHROPLASTY;  Surgeon: Gaynelle Arabian, MD;  Location: [facility];  Service: Orthopedics;  Laterality: Right;  8mn   TRANSURETHRAL RESECTION OF BLADDER TUMOR  2018   Family History  Problem Relation Age of Onset   Cancer Neg Hx    Outpatient Medications Prior to Visit  Medication Sig Dispense Refill   allopurinol (ZYLOPRIM) 100 MG tablet TAKE 1 TABLET(100 MG) BY MOUTH TWICE DAILY 180 tablet 3   carvedilol (COREG) 12.5 MG tablet TAKE 1 TABLET(12.5 MG) BY MOUTH TWICE DAILY WITH A MEAL 180 tablet 2   cholecalciferol (VITAMIN D3) 25 MCG (1000 UNIT) tablet Take 1,000 Units by mouth daily.     citalopram (CELEXA) 20 MG tablet Take 1 tablet (20 mg total) by mouth daily. 30 tablet 5   Coenzyme Q10 (COQ10 PO) Take 1 Dose by mouth daily. Liquid form     folic acid (FOLVITE) [id number]  tablet Take 800 mcg by mouth daily.     gabapentin (NEURONTIN) 300 MG capsule Take 1 capsule (300 mg total) by mouth at bedtime for 7 days, THEN 1 capsule (300 mg total) 2 (two) times daily for 7 days, THEN 1 capsule (300 mg total) 3 (three) times daily 90 capsule 3   levothyroxine (SYNTHROID) 88 MCG tablet TAKE 1 TABLET(88 MCG) BY MOUTH DAILY BEFORE BREAKFAST 90 tablet 1   loratadine (CLARITIN) 10 MG tablet Take 1 tablet (10 mg total) by mouth daily. 30 tablet 2   OVER THE COUNTER  MEDICATION Take 2 capsules by mouth daily. Brain Supplement     OVER THE COUNTER MEDICATION Take 2 tablets by mouth daily. Flush ( for toenail fungus)     Probiotic Product (PROBIOTIC GUMMIES PO) Take 1 tablet by mouth daily.     rivaroxaban (XARELTO) 20 MG TABS tablet Take 1 tablet (20 mg total) by mouth daily with supper. 30 tablet 2   rosuvastatin (CRESTOR) 10 MG tablet TAKE 1 TABLET(10 MG) BY MOUTH DAILY 90 tablet 3   No facility-administered medications prior to visit.   No Known Allergies Objective:   Today's Vitals   11/10/22 0800  BP: 128/70  Pulse: (!) 48  Temp: (!) 97.3 F (36.3 C)  TempSrc: Temporal  SpO2: 95%  Weight: 242 lb 3.2 oz (109.9 kg)  Height: 6' (1.829 m)   Body mass index is 32.85 kg/m.   General: Well developed, well nourished. No acute distress. Psych: Alert and oriented. Normal mood and affect.  Health Maintenance Due  Topic Date Due   DTaP/Tdap/Td (1 - Tdap) Never done   Zoster Vaccines- Shingrix (1 of 2) Never done   Medicare Annual Wellness (AWV)  12/03/2022   Imaging: Echocardiogram (10/20/2022) IMPRESSIONS   1. Left ventricular ejection fraction, by estimation, is 65 to 70%. Left ventricular ejection fraction by 3D volume is 68 %. The left ventricle has normal function. The left ventricle has no regional wall motion  abnormalities. Left ventricular diastolic parameters are consistent with Grade I diastolic dysfunction (impaired relaxation).   2. Right ventricular systolic function is normal. The right ventricular size is normal. Tricuspid regurgitation signal is inadequate for assessing PA pressure.   3. The mitral valve is abnormal. Trivial mitral valve regurgitation.   4. The aortic valve is tricuspid. There is moderate calcification of the aortic valve. Aortic valve regurgitation is not visualized. Mild aortic valve stenosis. Aortic valve area, by VTI measures 1.89 cm. Aortic valve mean gradient measures 13.0 mmHg. Aortic valve Vmax measures  2.36 m/s. Peak gradient 22.3 mmHg, DI is 0.50.     Assessment & Plan:   Problem List Items Addressed This Visit       Cardiovascular and Mediastinum   Essential hypertension - Primary    Blood pressure is in good control. Continue carvedilol 12.5 mg bid.        Endocrine   Hypothyroidism    Continue levothyroxine 88 mch daily.        Other   Hyperlipidemia    Continue rosuvastatin 10 mg daily.      Anxiety and depression    Stable despite some recent stressors. Continue citalopram 20 mg daily.      Relevant Medications   citalopram (CELEXA) 20 MG tablet    Return in about 3 months (around 02/10/2023) for Reassessment.   Haydee Salter, MD

## 2022-11-10 NOTE — Assessment & Plan Note (Signed)
Blood pressure is in good control. Continue carvedilol 12.5 mg bid.

## 2022-11-10 NOTE — Assessment & Plan Note (Signed)
Continue rosuvastatin 10 mg daily. 

## 2022-11-10 NOTE — Assessment & Plan Note (Signed)
Continue levothyroxine 88 mch daily.

## 2022-11-10 NOTE — Assessment & Plan Note (Signed)
Stable despite some recent stressors. Continue citalopram 20 mg daily.

## 2022-11-30 ENCOUNTER — Telehealth: Payer: Self-pay | Admitting: Family Medicine

## 2022-11-30 NOTE — Telephone Encounter (Signed)
Called patient to schedule Medicare Annual Wellness Visit (AWV). Left message for patient to call back and schedule Medicare Annual Wellness Visit (AWV).  Last date of AWV: 12/02/21  Please schedule an appointment at any time with [facility] .  If any questions, please contact me at [phone].  Thank you ,  Shirlean Mylar [phone]

## 2023-01-12 IMAGING — US US ABSCESS DRAINAGE W/ CATHETER
1 series · 13 of 14 positions shown · non-contrast
Comparison: none

INDICATION: 75-year-old male with fluid collection within the left liver
concerning for abscess

[Series 1: us abscess drain · 13 of 14 slices shown]
[im 1/14]
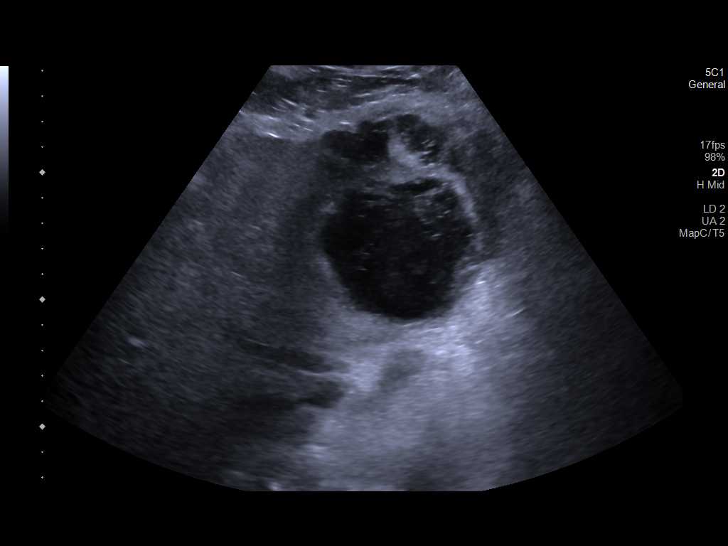
[im 2/14]
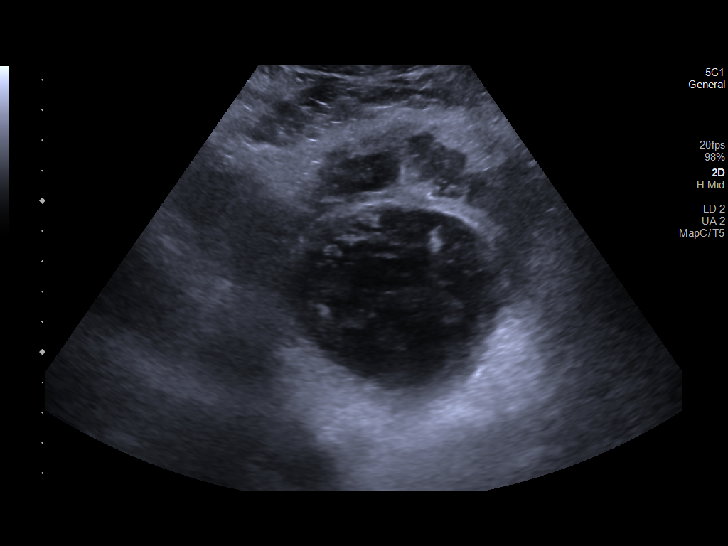
[im 3/14]
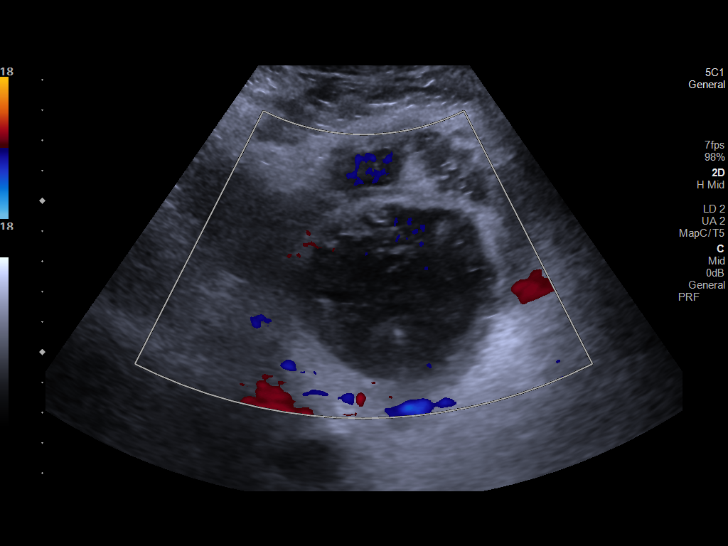
[im 4/14]
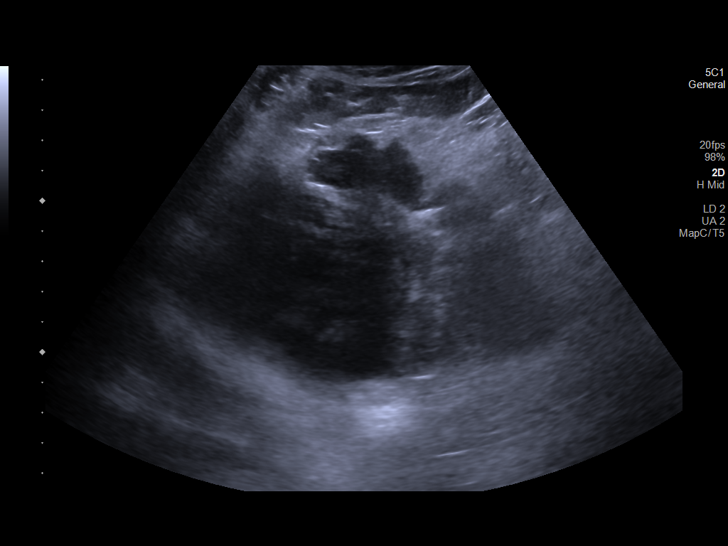
[im 5/14]
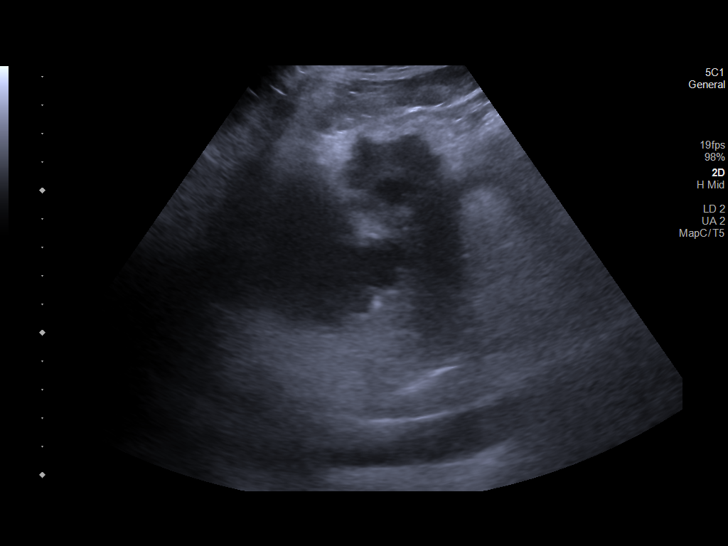
[im 6/14]
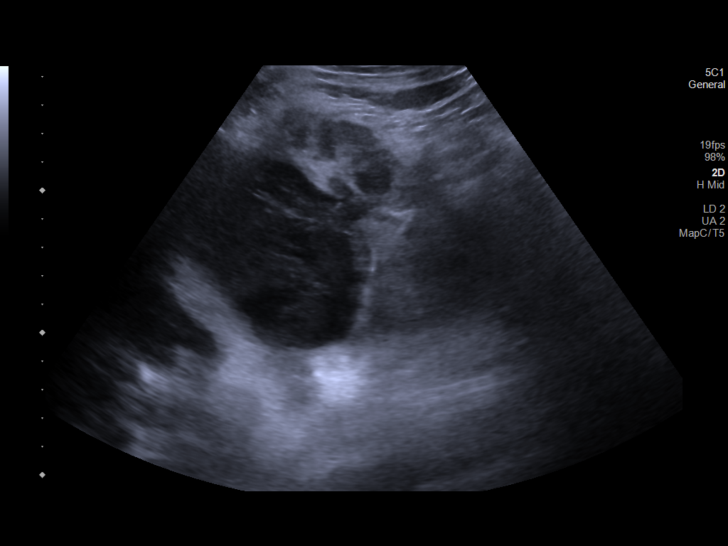
[im 8/14]
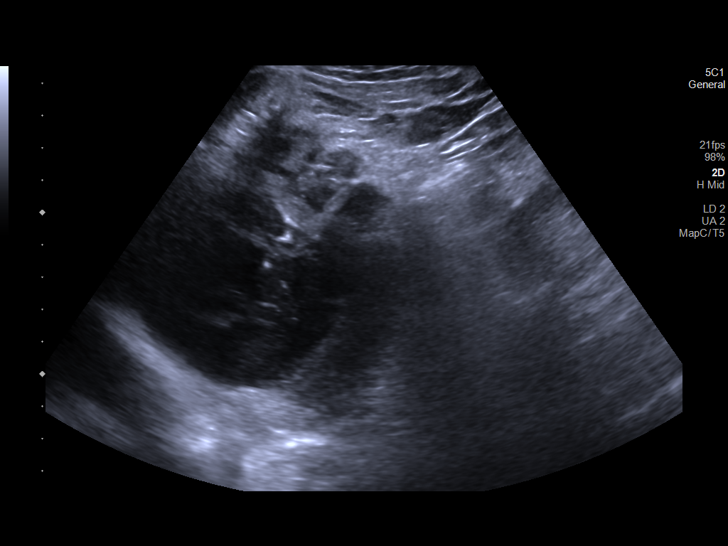
[im 9/14]
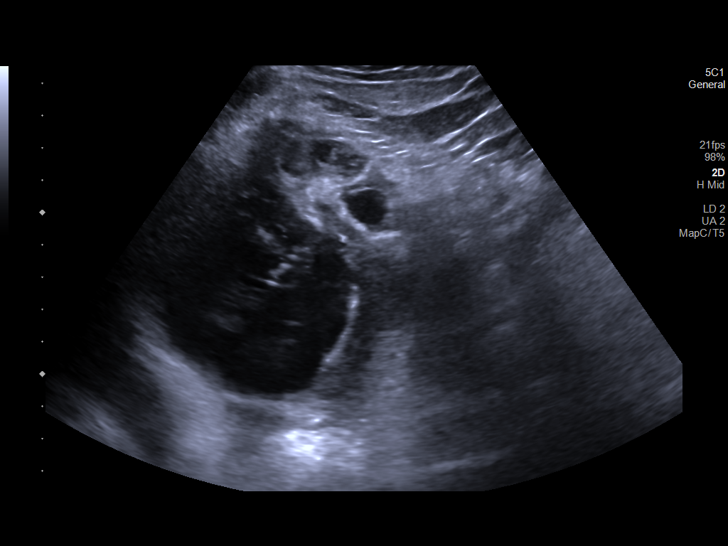
[im 10/14]
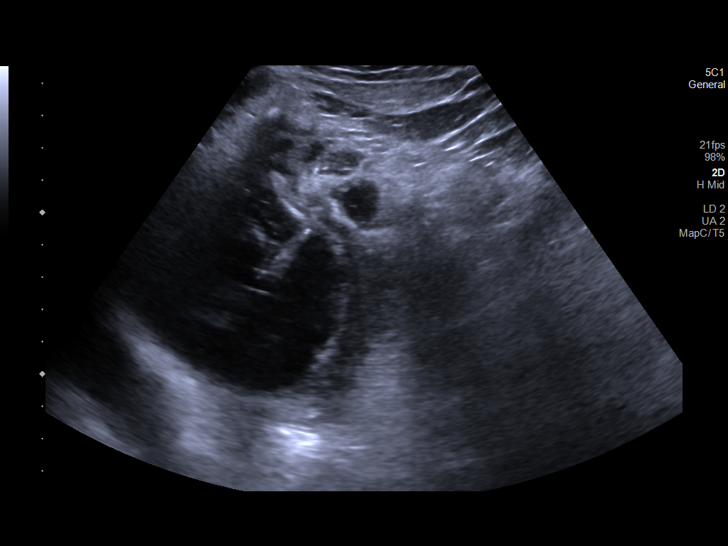
[im 11/14]
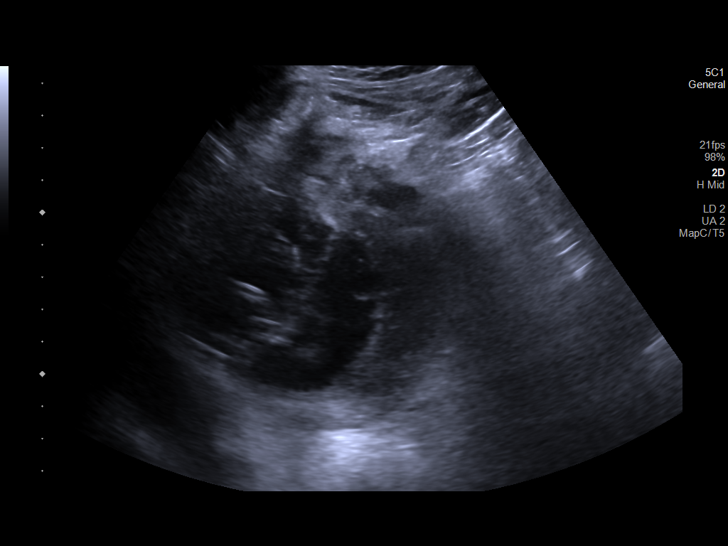
[im 12/14]
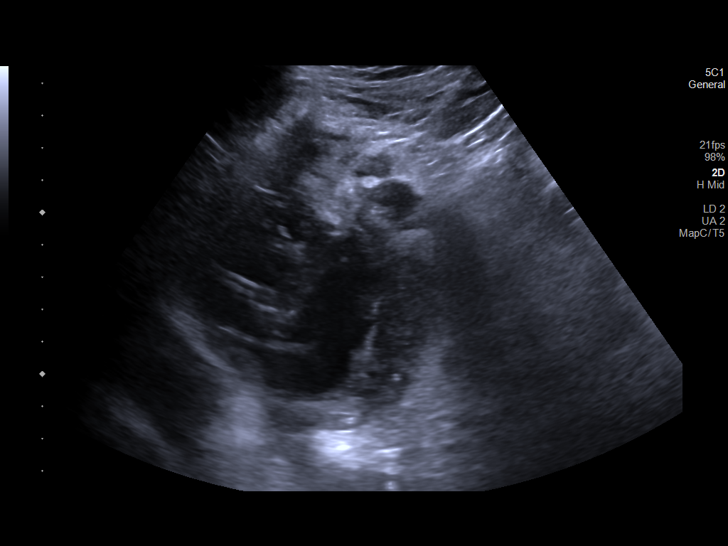
[im 13/14]
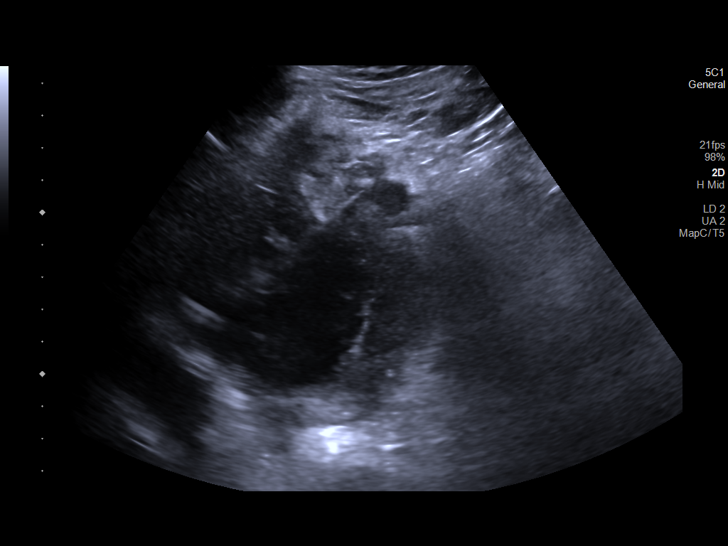
[im 14/14]
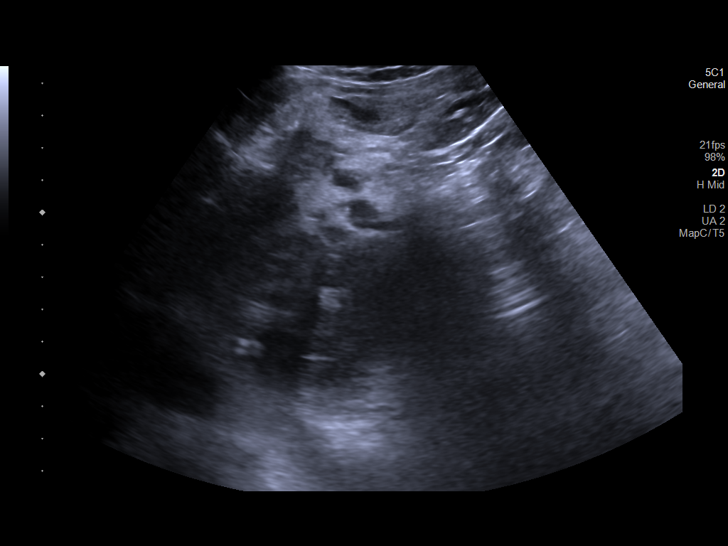

[13 of 14 positions shown; findings below may reference images not displayed]

EXAM:
IMAGE GUIDED DRAINAGE OF LIVER ABSCESS

MEDICATIONS:
None

ANESTHESIA/SEDATION:
Fentanyl 1.5 mcg IV; Versed 50 mg IV

Moderate Sedation Time:  16 minutes

The patient was continuously monitored during the procedure by the
interventional radiology nurse under my direct supervision.

COMPLICATIONS:
None

PROCEDURE:
Informed written consent was obtained from the patient after a
thorough discussion of the procedural risks, benefits and
alternatives. All questions were addressed. Maximal Sterile Barrier
Technique was utilized including caps, mask, sterile gowns, sterile
gloves, sterile drape, hand hygiene and skin antiseptic. A timeout
was performed prior to the initiation of the procedure.

Patient was positioned supine position on the stretcher. Images of
the upper abdomen in the subxiphoid region were stored sent to PACs.

The patient was then prepped and draped in the usual sterile
fashion. 1% lidocaine was used for local anesthesia. Small stab
incision was made with 11 blade scalpel. Twelve French drain was
then placed via trocar technique into the abscess of the left liver.
Once the catheter was in position we aspirated approximately 130 cc
of purulent material. Sample was sent for cytology and for culture.
Abscess drain was attached to bulb suction. Drain was sutured in
position.

Patient tolerated the procedure well and remained hemodynamically
stable throughout.

No complications were encountered and no significant blood loss.
IMPRESSION: Status post ultrasound-guided drainage of left liver abscess.

## 2023-01-26 ENCOUNTER — Ambulatory Visit: Payer: Medicare Other | Attending: Internal Medicine | Admitting: Internal Medicine

## 2023-02-10 ENCOUNTER — Ambulatory Visit: Payer: Medicare Other | Admitting: Family Medicine

## 2023-02-11 ENCOUNTER — Ambulatory Visit (INDEPENDENT_AMBULATORY_CARE_PROVIDER_SITE_OTHER): Payer: Medicare Other | Admitting: Family Medicine

## 2023-02-11 ENCOUNTER — Encounter: Payer: Self-pay | Admitting: Family Medicine

## 2023-02-11 VITALS — BP 134/72 | HR 48 | Temp 97.6°F | Ht 72.0 in | Wt 244.6 lb

## 2023-02-11 DIAGNOSIS — E039 Hypothyroidism, unspecified: Secondary | ICD-10-CM | POA: Diagnosis not present

## 2023-02-11 DIAGNOSIS — R7303 Prediabetes: Secondary | ICD-10-CM | POA: Diagnosis not present

## 2023-02-11 DIAGNOSIS — Z8546 Personal history of malignant neoplasm of prostate: Secondary | ICD-10-CM

## 2023-02-11 DIAGNOSIS — E785 Hyperlipidemia, unspecified: Secondary | ICD-10-CM

## 2023-02-11 DIAGNOSIS — I10 Essential (primary) hypertension: Secondary | ICD-10-CM | POA: Diagnosis not present

## 2023-02-11 LAB — LIPID PANEL
Cholesterol: 161 mg/dL (ref 0–200)
HDL: 39.4 mg/dL (ref >39.00)
LDL Cholesterol: 84 mg/dL (ref 0–99)
NonHDL: 121.99
Total CHOL/HDL Ratio: 4
Triglycerides: 189 mg/dL — ABNORMAL HIGH (ref 0.0–149.0)
VLDL: 37.8 mg/dL (ref 0.0–40.0)

## 2023-02-11 LAB — HEMOGLOBIN A1C: Hgb A1c MFr Bld: 5.8 % (ref 4.6–6.5)

## 2023-02-11 LAB — PSA: PSA: 0 ng/mL — ABNORMAL LOW (ref 0.10–4.00)

## 2023-02-11 NOTE — Assessment & Plan Note (Addendum)
Continue levothyroxine 88 mcg daily.

## 2023-02-11 NOTE — Assessment & Plan Note (Signed)
Blood pressure is in good control. Continue carvedilol 12.5 mg bid. 

## 2023-02-11 NOTE — Assessment & Plan Note (Signed)
Will check annual A1c for monitoring.

## 2023-02-11 NOTE — Assessment & Plan Note (Signed)
Will request a PSA for monitoring.

## 2023-02-11 NOTE — Assessment & Plan Note (Signed)
At goal. Continue rosuvastatin 10 mg daily. 

## 2023-02-11 NOTE — Progress Notes (Signed)
[facility] [address] [place] [address] Dept: [phone] Dept Fax: [phone]  Chronic Care Office Visit  Subjective:    Patient ID: [name], male    DOB: 03/29/1946, 77 y.o..   MRN: [id number]  Chief Complaint  Patient presents with   Medical Management of Chronic Issues    3 month f/u.  Fasting today.  C/o having ? Constipation(feeling of not able to finish) and back pain after a fall.    History of Present Illness:  Patient is in today for reassessment of chronic medical issues.  [name] has a history of hypertension. He is managed on carvedilol 12.5 mg bid.    [name] has a history of hypothyroidism. He is managed on levothyroxine 88 mcg daily.   [name] has a history of hyperlipidemia. He is managed on rosuvastatin 10 mg daily.   [name] has a history of a prior DVT. He vascular doctor has stopped his Xarelto and now has him on a daily aspirin 81 mg.   [name] has a history of anxiety and depression. This Fall/Winter, he had been noticing increased issues with depressed mood. He was started back on citalopram 10 mg daily. He notes his mood had been doing better. However, he is noting some work issues that are currently creating some stressors for him. Overall, he is managing with this.  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/2020   Essential hypertension 01/11/2020   Onychomycosis 10/12/2019   Hypothyroidism 10/12/2019    Hyperlipidemia 10/12/2019   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   Gout 05/03/2019   Personal history of prostate cancer 05/03/2019   Obesity (BMI 30.0-34.9) 04/26/2019   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   Past Surgical History:  Procedure Laterality Date   EYE SURGERY Bilateral 2014   HERNIA REPAIR Right 1984   inguinal   IR RADIOLOGIST EVAL & MGMT  03/19/2021   KNEE ARTHROSCOPY Left 1966   LUMBAR LAMINECTOMY/DECOMPRESSION MICRODISCECTOMY N/A 05/13/2022   Procedure: Microlumbar laminectomy Lumbar three-four , Lumbar four-five;  Surgeon: [name] Every, MD;  Location: [facility] OR;  Service: Orthopedics;  Laterality: N/A;   PENILE PROSTHESIS IMPLANT  2014   TOTAL KNEE ARTHROPLASTY Right 08/11/2020   Procedure: TOTAL KNEE ARTHROPLASTY;  Surgeon: [name] Gross, MD;  Location: [facility];  Service: Orthopedics;  Laterality: Right;    TRANSURETHRAL RESECTION OF BLADDER TUMOR  2018   Family History  Problem Relation Age of Onset   Cancer Neg Hx    Outpatient Medications Prior to Visit  Medication Sig Dispense Refill   allopurinol (ZYLOPRIM) 100 MG tablet TAKE 1 TABLET(100 MG) BY MOUTH TWICE DAILY 180 tablet 3   aspirin EC 81 MG tablet Take 81 mg by mouth daily. Swallow whole.     carvedilol (COREG) 12.5 MG tablet TAKE 1 TABLET(12.5 MG) BY MOUTH TWICE DAILY WITH A MEAL 180 tablet 2   cholecalciferol (VITAMIN D3) 25 MCG (1000 UNIT) tablet Take 1,000 Units by mouth daily.     citalopram (CELEXA) 20 MG  tablet Take 1 tablet (20 mg total) by mouth daily. 90 tablet 3   Coenzyme Q10 (COQ10 PO) Take 1 Dose by mouth daily. Liquid form     folic acid (FOLVITE) 800 MCG tablet Take 800 mcg by mouth daily.     levothyroxine (SYNTHROID) 88 MCG tablet TAKE 1 TABLET(88 MCG) BY MOUTH DAILY BEFORE BREAKFAST 90 tablet 1   loratadine (CLARITIN) 10 MG tablet Take 1 tablet (10 mg total) by mouth daily. 30 tablet 2   OVER THE COUNTER MEDICATION Take 2 capsules by mouth daily. Brain Supplement      OVER THE COUNTER MEDICATION Take 2 tablets by mouth daily. Flush ( for toenail fungus)     OVER THE COUNTER MEDICATION Tablespoon of olive oil daily     rosuvastatin (CRESTOR) 10 MG tablet TAKE 1 TABLET(10 MG) BY MOUTH DAILY 90 tablet 3   gabapentin (NEURONTIN) 300 MG capsule Take 1 capsule (300 mg total) by mouth at bedtime for 7 days, THEN 1 capsule (300 mg total) 2 (two) times daily for 7 days, THEN 1 capsule (300 mg total) 3 (three) times daily 90 capsule 3   Probiotic Product (PROBIOTIC GUMMIES PO) Take 1 tablet by mouth daily.     rivaroxaban (XARELTO) 20 MG TABS tablet Take 1 tablet (20 mg total) by mouth daily with supper. 30 tablet 2   No facility-administered medications prior to visit.   No Known Allergies Objective:   Today's Vitals   02/11/23 0813  BP: 138/74  Pulse: (!) 48  Temp: 97.6 F (36.4 C)  TempSrc: Temporal  SpO2: 97%  Weight: 244 lb 9.6 oz (110.9 kg)  Height: 6' (1.829 m)   Body mass index is 33.17 kg/m.   General: Well developed, well nourished. No acute distress. Psych: Alert and oriented. Normal mood and affect.  Health Maintenance Due  Topic Date Due   DTaP/Tdap/Td (1 - Tdap) Never done   Zoster Vaccines- Shingrix (1 of 2) Never done   Medicare Annual Wellness (AWV)  12/03/2022     Assessment & Plan:   Problem List Items Addressed This Visit       Cardiovascular and Mediastinum   Essential hypertension - Primary    Blood pressure is in good control. Continue carvedilol 12.5 mg bid.      Relevant Medications   aspirin EC 81 MG tablet     Endocrine   Hypothyroidism    Continue levothyroxine 88 mcg daily.        Other   Personal history of prostate cancer    Will request a PSA for monitoring.      Relevant Orders   PSA   Hyperlipidemia    At goal. Continue rosuvastatin 10 mg daily.      Relevant Medications   aspirin EC 81 MG tablet   Other Relevant Orders   Lipid panel   Prediabetes    Will check annual A1c for  monitoring.      Relevant Orders   Hemoglobin A1c    Return in about 3 months (around 05/14/2023) for Reassessment.   [name] Mast, MD

## 2023-04-14 ENCOUNTER — Encounter (INDEPENDENT_AMBULATORY_CARE_PROVIDER_SITE_OTHER): Payer: Self-pay

## 2023-04-24 ENCOUNTER — Other Ambulatory Visit: Payer: Self-pay | Admitting: Family Medicine

## 2023-04-24 DIAGNOSIS — E039 Hypothyroidism, unspecified: Secondary | ICD-10-CM

## 2023-05-16 ENCOUNTER — Encounter: Payer: Self-pay | Admitting: Family Medicine

## 2023-05-16 ENCOUNTER — Ambulatory Visit (INDEPENDENT_AMBULATORY_CARE_PROVIDER_SITE_OTHER): Payer: Medicare Other | Admitting: Family Medicine

## 2023-05-16 VITALS — BP 120/68 | HR 60 | Temp 98.4°F | Wt 249.4 lb

## 2023-05-16 DIAGNOSIS — J309 Allergic rhinitis, unspecified: Secondary | ICD-10-CM | POA: Insufficient documentation

## 2023-05-16 DIAGNOSIS — F419 Anxiety disorder, unspecified: Secondary | ICD-10-CM

## 2023-05-16 DIAGNOSIS — F32A Depression, unspecified: Secondary | ICD-10-CM

## 2023-05-16 DIAGNOSIS — E039 Hypothyroidism, unspecified: Secondary | ICD-10-CM

## 2023-05-16 DIAGNOSIS — I10 Essential (primary) hypertension: Secondary | ICD-10-CM | POA: Diagnosis not present

## 2023-05-16 DIAGNOSIS — J301 Allergic rhinitis due to pollen: Secondary | ICD-10-CM

## 2023-05-16 DIAGNOSIS — L309 Dermatitis, unspecified: Secondary | ICD-10-CM | POA: Insufficient documentation

## 2023-05-16 DIAGNOSIS — Z23 Encounter for immunization: Secondary | ICD-10-CM

## 2023-05-16 DIAGNOSIS — E785 Hyperlipidemia, unspecified: Secondary | ICD-10-CM

## 2023-05-16 MED ORDER — TRIAMCINOLONE ACETONIDE 0.1 % EX CREA
1.0000 | TOPICAL_CREAM | Freq: Two times a day (BID) | CUTANEOUS | 0 refills | Status: DC
Start: 2023-05-16 — End: 2024-01-20

## 2023-05-16 NOTE — Assessment & Plan Note (Signed)
Blood pressure is in good control. Continue carvedilol 12.5 mg bid.

## 2023-05-16 NOTE — Assessment & Plan Note (Signed)
At goal. Continue rosuvastatin 10 mg daily.

## 2023-05-16 NOTE — Assessment & Plan Note (Signed)
Continue citalopram 20 mg daily. I will refer Mr. [name] for counseling, as I think this would help with him working through his anxieties around his daughter's health.

## 2023-05-16 NOTE — Assessment & Plan Note (Addendum)
Stable.  Continue levothyroxine daily

## 2023-05-16 NOTE — Assessment & Plan Note (Signed)
Suspect the skin changes are due to constant abrasion. I recommend he use padding for activities that involve pressure over the elbow. I will prescribed TAC for irritation.

## 2023-05-16 NOTE — Progress Notes (Signed)
[facility] [address] [place] [address] Dept: [phone] Dept Fax: [phone]  Chronic Care Office Visit  Subjective:    Patient ID: Eden Manetta, male    DOB: 04/11/1946, 77 y.o..   MRN: [id number]  Chief Complaint  Patient presents with   Hypertension    3 Month f/u   Wheezing    Requesting examination of chest sounds. PT's wife complains of wheezing. Was prescribed Claritin in the past   History of Present Illness:  Patient is in today for reassessment of chronic medical issues.  Mr. Kell has a history of hypertension. He is managed on carvedilol 12.5 mg bid.    Mr. Karaman has a history of hypothyroidism. He is managed on levothyroxine 88 mcg daily.   Mr. Balzer has a history of hyperlipidemia. He is managed on rosuvastatin 10 mg daily.   Mr. Engelhard has a history of a prior DVT. He is managed on a daily aspirin 81 mg.   Mr. Henrich has a history of anxiety and depression. He is managed on citalopram 20 mg daily. He notes his mood and energy level has been down some over the past several months, triggered by new health issues for his daughter.   Mr. Kham states his wife has noted him having an audible wheeze, both at night and during the day. Mr. Broccoli states he can't really say he has noticed this. Previously, he had a chronic cough that responded to taking Zyrtec. He is not on an antihistamine at present.  Mr. Ehrhart notes he has a bony swelling of the left elbow. This has been present for years. He notes the skin over the elbow stays irritated.  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/2020   Essential hypertension 01/11/2020   Onychomycosis 10/12/2019   Hypothyroidism 10/12/2019   Hyperlipidemia 10/12/2019   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   Gout 05/03/2019   Personal history of prostate cancer 05/03/2019   Obesity (BMI 30.0-34.9) 04/26/2019   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   Past Surgical History:  Procedure Laterality Date   EYE SURGERY Bilateral 2014   HERNIA REPAIR Right 1984   inguinal   IR RADIOLOGIST EVAL & MGMT  03/19/2021   KNEE ARTHROSCOPY Left 1966   LUMBAR LAMINECTOMY/DECOMPRESSION MICRODISCECTOMY N/A 05/13/2022   Procedure: Microlumbar laminectomy Lumbar three-four , Lumbar four-five;  Surgeon: Jene Every, MD;  Location: [facility] OR;  Service: Orthopedics;  Laterality: N/A;   PENILE PROSTHESIS IMPLANT  2014   TOTAL KNEE ARTHROPLASTY Right 08/11/2020   Procedure: TOTAL KNEE ARTHROPLASTY;  Surgeon: Ollen Gross, MD;  Location: [facility];  Service: Orthopedics;  Laterality: Right;    TRANSURETHRAL RESECTION OF BLADDER TUMOR  2018   Family History  Problem Relation Age of Onset   Cancer Neg Hx    Outpatient Medications Prior to Visit  Medication Sig Dispense Refill   allopurinol (ZYLOPRIM) 100 MG tablet TAKE 1 TABLET(100 MG) BY MOUTH TWICE DAILY 180 tablet 3   aspirin EC 81  MG tablet Take 81 mg by mouth daily. Swallow whole.     carvedilol (COREG) 12.5 MG tablet TAKE 1 TABLET(12.5 MG) BY MOUTH TWICE DAILY WITH A MEAL 180 tablet 2   cholecalciferol (VITAMIN D3) 25 MCG (1000 UNIT) tablet Take 1,000 Units by mouth daily.     citalopram (CELEXA) 20 MG tablet Take 1 tablet (20 mg total) by mouth daily. 90 tablet 3   Coenzyme Q10 (COQ10 PO) Take 1 Dose by mouth daily. Liquid form     folic acid (FOLVITE) 800 MCG tablet Take 800 mcg by mouth  daily.     levothyroxine (SYNTHROID) 88 MCG tablet TAKE 1 TABLET(88 MCG) BY MOUTH DAILY BEFORE BREAKFAST 90 tablet 3   loratadine (CLARITIN) 10 MG tablet Take 1 tablet (10 mg total) by mouth daily. 30 tablet 2   MAGNESIUM CITRATE PO Take by mouth.     OVER THE COUNTER MEDICATION Take 2 capsules by mouth daily. Brain Supplement     OVER THE COUNTER MEDICATION Tablespoon of olive oil daily     rosuvastatin (CRESTOR) 10 MG tablet TAKE 1 TABLET(10 MG) BY MOUTH DAILY 90 tablet 3   OVER THE COUNTER MEDICATION Take 2 tablets by mouth daily. Flush ( for toenail fungus) (Patient not taking: Reported on 05/16/2023)     No facility-administered medications prior to visit.   No Known Allergies Objective:   Today's Vitals   05/16/23 0847  BP: 120/68  Pulse: 60  Temp: 98.4 F (36.9 C)  TempSrc: Temporal  SpO2: 98%  Weight: 249 lb 6.4 oz (113.1 kg)   Body mass index is 33.82 kg/m.   General: Well developed, well nourished. No acute distress. HEENT: Normocephalic, non-traumatic. External ears normal. EAC and TMs normal bilaterally. Conjunctiva clear. Nose    with mild to moderate congestion and scant rhinorrhea. Mucous membranes moist. Oropharynx clear. Good dentition. Neck: Supple. No lymphadenopathy. No thyromegaly. Lungs: Clear to auscultation bilaterally. No wheezing, rales or rhonchi. 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   The skin overlying this is mildly red and scaly. Psych: Alert and oriented. Normal mood and affect.  Health Maintenance Due  Topic Date Due   DTaP/Tdap/Td (1 - Tdap) Never done   Zoster Vaccines- Shingrix (1 of 2) Never done   Medicare Annual Wellness (AWV)  12/03/2022   INFLUENZA VACCINE  03/31/2023     Assessment & Plan:   Problem List Items Addressed This Visit       Cardiovascular and Mediastinum   Essential hypertension - Primary    Blood pressure is in good control.  Continue carvedilol 12.5 mg bid.        Respiratory   Allergic rhinitis    I recommend he resume a daily antihistamine, such as Zyrtec.        Endocrine   Hypothyroidism    Stable. Continue levothyroxine 88 mcg daily.        Musculoskeletal and Integument   Dermatitis- right elbow    Suspect the skin changes are due to constant abrasion. I recommend he use padding for activities that involve pressure over the elbow. I will prescribed TAC for irritation.       Relevant Medications   triamcinolone cream (KENALOG) 0.1 %     Other   Anxiety and depression    Continue citalopram 20 mg daily. I will refer Mr. Raffetto for counseling, as I think this would help with him working  through his anxieties around his daughter's health.      Relevant Orders   Ambulatory referral to Psychology   Hyperlipidemia    At goal. Continue rosuvastatin 10 mg daily.      Other Visit Diagnoses     Need for immunization against influenza       Relevant Orders   Flu Vaccine Trivalent High Dose (Fluad) (Completed)       Return in about 4 months (around 09/15/2023) for Reassessment.   Loyola Mast, MD

## 2023-05-16 NOTE — Assessment & Plan Note (Signed)
I recommend he resume a daily antihistamine, such as Zyrtec.

## 2023-07-25 ENCOUNTER — Emergency Department (HOSPITAL_COMMUNITY)
Admission: EM | Admit: 2023-07-25 | Discharge: 2023-07-25 | Disposition: A | Discharge status: Home / Self Care | Payer: Medicare Other | Attending: Emergency Medicine | Admitting: Emergency Medicine

## 2023-07-25 ENCOUNTER — Other Ambulatory Visit: Payer: Self-pay

## 2023-07-25 ENCOUNTER — Encounter (HOSPITAL_COMMUNITY): Payer: Self-pay

## 2023-07-25 ENCOUNTER — Emergency Department (HOSPITAL_COMMUNITY): Payer: Medicare Other

## 2023-07-25 DIAGNOSIS — N132 Hydronephrosis with renal and ureteral calculous obstruction: Secondary | ICD-10-CM | POA: Diagnosis not present

## 2023-07-25 DIAGNOSIS — Z7982 Long term (current) use of aspirin: Secondary | ICD-10-CM | POA: Diagnosis not present

## 2023-07-25 DIAGNOSIS — R112 Nausea with vomiting, unspecified: Secondary | ICD-10-CM | POA: Diagnosis not present

## 2023-07-25 DIAGNOSIS — Z79899 Other long term (current) drug therapy: Secondary | ICD-10-CM | POA: Insufficient documentation

## 2023-07-25 DIAGNOSIS — I10 Essential (primary) hypertension: Secondary | ICD-10-CM | POA: Diagnosis not present

## 2023-07-25 DIAGNOSIS — Z8551 Personal history of malignant neoplasm of bladder: Secondary | ICD-10-CM | POA: Insufficient documentation

## 2023-07-25 DIAGNOSIS — K5900 Constipation, unspecified: Secondary | ICD-10-CM | POA: Insufficient documentation

## 2023-07-25 DIAGNOSIS — R7989 Other specified abnormal findings of blood chemistry: Secondary | ICD-10-CM | POA: Insufficient documentation

## 2023-07-25 DIAGNOSIS — Z8546 Personal history of malignant neoplasm of prostate: Secondary | ICD-10-CM | POA: Diagnosis not present

## 2023-07-25 DIAGNOSIS — N201 Calculus of ureter: Secondary | ICD-10-CM

## 2023-07-25 DIAGNOSIS — R1032 Left lower quadrant pain: Secondary | ICD-10-CM | POA: Diagnosis present

## 2023-07-25 LAB — URINALYSIS, ROUTINE W REFLEX MICROSCOPIC
Bilirubin Urine: NEGATIVE
Glucose, UA: NEGATIVE mg/dL
Ketones, ur: NEGATIVE mg/dL
Leukocytes,Ua: NEGATIVE
Nitrite: NEGATIVE
Protein, ur: NEGATIVE mg/dL
Specific Gravity, Urine: 1.034 — ABNORMAL HIGH (ref 1.005–1.030)
pH: 6 (ref 5.0–8.0)

## 2023-07-25 LAB — CBC WITH DIFFERENTIAL/PLATELET
Abs Immature Granulocytes: 0.03 10*3/uL (ref 0.00–0.07)
Basophils Absolute: 0 10*3/uL (ref 0.0–0.1)
Basophils Relative: 0 %
Eosinophils Absolute: 0.1 10*3/uL (ref 0.0–0.5)
Eosinophils Relative: 1 %
HCT: 47.2 % (ref 39.0–52.0)
Hemoglobin: 15 g/dL (ref 13.0–17.0)
Immature Granulocytes: 0 %
Lymphocytes Relative: 18 %
Lymphs Abs: 1.5 10*3/uL (ref 0.7–4.0)
MCH: 29.7 pg (ref 26.0–34.0)
MCHC: 31.8 g/dL (ref 30.0–36.0)
MCV: 93.5 fL (ref 80.0–100.0)
Monocytes Absolute: 0.7 10*3/uL (ref 0.1–1.0)
Monocytes Relative: 9 %
Neutro Abs: 5.9 10*3/uL (ref 1.7–7.7)
Neutrophils Relative %: 72 %
Platelets: 129 10*3/uL — ABNORMAL LOW (ref 150–400)
RBC: 5.05 MIL/uL (ref 4.22–5.81)
RDW: 14.5 % (ref 11.5–15.5)
WBC: 8.3 10*3/uL (ref 4.0–10.5)
nRBC: 0 % (ref 0.0–0.2)

## 2023-07-25 LAB — COMPREHENSIVE METABOLIC PANEL
ALT: 30 U/L (ref 0–44)
AST: 31 U/L (ref 15–41)
Albumin: 3.6 g/dL (ref 3.5–5.0)
Alkaline Phosphatase: 78 U/L (ref 38–126)
Anion gap: 9 (ref 5–15)
BUN: 25 mg/dL — ABNORMAL HIGH (ref 8–23)
CO2: 19 mmol/L — ABNORMAL LOW (ref 22–32)
Calcium: 9.9 mg/dL (ref 8.9–10.3)
Chloride: 106 mmol/L (ref 98–111)
Creatinine, Ser: 1.36 mg/dL — ABNORMAL HIGH (ref 0.61–1.24)
GFR, Estimated: 54 mL/min — ABNORMAL LOW (ref >60)
Glucose, Bld: 109 mg/dL — ABNORMAL HIGH (ref 70–99)
Potassium: 4.8 mmol/L (ref 3.5–5.1)
Sodium: 134 mmol/L — ABNORMAL LOW (ref 135–145)
Total Bilirubin: 0.6 mg/dL (ref <1.2)
Total Protein: 6.8 g/dL (ref 6.5–8.1)

## 2023-07-25 LAB — LIPASE, BLOOD: Lipase: 54 U/L — ABNORMAL HIGH (ref 11–51)

## 2023-07-25 MED ORDER — IOHEXOL 350 MG/ML SOLN
100.0000 mL | Freq: Once | INTRAVENOUS | Status: AC | PRN
  Administered 2023-07-25: 100 mL via INTRAVENOUS

## 2023-07-25 MED ORDER — ONDANSETRON HCL 4 MG/2ML IJ SOLN
4.0000 mg | Freq: Once | INTRAMUSCULAR | Status: AC
  Administered 2023-07-25: 4 mg via INTRAVENOUS
  Filled 2023-07-25: qty 2

## 2023-07-25 MED ORDER — MORPHINE SULFATE (PF) 4 MG/ML IV SOLN
4.0000 mg | Freq: Once | INTRAVENOUS | Status: AC
  Administered 2023-07-25: 4 mg via INTRAVENOUS
  Filled 2023-07-25: qty 1

## 2023-07-25 MED ORDER — MORPHINE SULFATE (PF) 4 MG/ML IV SOLN
4.0000 mg | Freq: Once | INTRAVENOUS | Status: AC
  Administered 2023-07-25: 4 mg via INTRAVENOUS
  Filled 2023-07-25 (×2): qty 1

## 2023-07-25 MED ORDER — HYDROCODONE-ACETAMINOPHEN 5-325 MG PO TABS: 1.0000 | ORAL_TABLET | ORAL | 0 refills | Status: DC | PRN

## 2023-07-25 MED ORDER — TAMSULOSIN HCL 0.4 MG PO CAPS: 0.4000 mg | ORAL_CAPSULE | Freq: Every day | ORAL | 0 refills | Status: DC

## 2023-07-25 MED ORDER — ONDANSETRON 4 MG PO TBDP: 4.0000 mg | ORAL_TABLET | Freq: Three times a day (TID) | ORAL | 0 refills | Status: DC | PRN

## 2023-07-25 NOTE — ED Triage Notes (Signed)
Pt c/o abdominal pain that started this evening. Pt has had intermittent constipation for past 3 weeks.   Pain radiates around abdomen into back. Pt states he has only had small bowel movement for "some time".  Pt has vomited today.  EMS VS 168/88 HR 54 99% RA Cbg 108

## 2023-07-25 NOTE — ED Notes (Signed)
Pt to CT scan.

## 2023-07-25 NOTE — Discharge Instructions (Addendum)
Home to rest and hydrate. Take Flomax daily. Take zofran as needed as prescribed. Take Norco as needed as prescribed for pain. Do not drive or operate machinery while taking Norco. Norco can cause constipation. Please take Miralax as discussed. Can add a stool softener if needed.  Follow up with urology, please call today to schedule an appointment. Return to the ER for fever (temp 100.4 or higher), pain or vomiting not controlled with provided medications or any other concerning symptoms.

## 2023-07-25 NOTE — ED Provider Notes (Addendum)
[place] EMERGENCY DEPARTMENT AT [facility] Provider Note   CSN: [id number] Arrival date & time: 07/25/23  4098     History  Chief Complaint  Patient presents with   Abdominal Pain    [name] is a 77 y.o. male.  77 year old male brought in by EMS from home with complaint of left lower quadrant abdominal pain which radiates into his left flank area onset 11 PM tonight.  Pain is constant, associated with nausea with 1 episode of vomiting.  Tried to have a bowel movement to relieve his pain but was unable to pass any significant amount of stool.  Denies any blood in his stools.  States that he has been constipated or had decreased stool output for the past 3 weeks or so.  No prior abdominal surgeries.  No fevers.  No other complaints or concerns.       Home Medications Prior to Admission medications   Medication Sig Start Date End Date Taking? Authorizing Provider  HYDROcodone-acetaminophen (NORCO/VICODIN) 5-325 MG tablet Take 1 tablet by mouth every 4 (four) hours as needed. 07/25/23  Yes Jeannie Fend, PA-C  ondansetron (ZOFRAN-ODT) 4 MG disintegrating tablet Take 1 tablet (4 mg total) by mouth every 8 (eight) hours as needed for nausea or vomiting. 07/25/23  Yes Jeannie Fend, PA-C  tamsulosin (FLOMAX) 0.4 MG CAPS capsule Take 1 capsule (0.4 mg total) by mouth daily. 07/25/23  Yes Jeannie Fend, PA-C  allopurinol (ZYLOPRIM) 100 MG tablet TAKE 1 TABLET(100 MG) BY MOUTH TWICE DAILY 05/30/22   Loyola Mast, MD  aspirin EC 81 MG tablet Take 81 mg by mouth daily. Swallow whole.    [provider]  carvedilol (COREG) 12.5 MG tablet TAKE 1 TABLET(12.5 MG) BY MOUTH TWICE DAILY WITH A MEAL 10/28/22   Gaston Islam., NP  cholecalciferol (VITAMIN D3) 25 MCG (1000 UNIT) tablet Take 1,000 Units by mouth daily.    [provider]  citalopram (CELEXA) 20 MG tablet Take 1 tablet (20 mg total) by mouth daily. 11/10/22   Loyola Mast, MD  Coenzyme Q10  (COQ10 PO) Take 1 Dose by mouth daily. Liquid form    [provider]  folic acid (FOLVITE) 800 MCG tablet Take 800 mcg by mouth daily.    [provider]  levothyroxine (SYNTHROID) 88 MCG tablet TAKE 1 TABLET(88 MCG) BY MOUTH DAILY BEFORE BREAKFAST 04/25/23   Loyola Mast, MD  loratadine (CLARITIN) 10 MG tablet Take 1 tablet (10 mg total) by mouth daily. 07/14/22   McElwee, Lauren A, NP  MAGNESIUM CITRATE PO Take by mouth.    [provider]  OVER THE COUNTER MEDICATION Take 2 capsules by mouth daily. Brain Supplement    [provider]  OVER THE COUNTER MEDICATION Take 2 tablets by mouth daily. Flush ( for toenail fungus) Patient not taking: Reported on 05/16/2023    [provider]  OVER THE COUNTER MEDICATION Tablespoon of olive oil daily    [provider]  rosuvastatin (CRESTOR) 10 MG tablet TAKE 1 TABLET(10 MG) BY MOUTH DAILY 09/24/22   Loyola Mast, MD  triamcinolone cream (KENALOG) 0.1 % Apply 1 Application topically 2 (two) times daily. 05/16/23   Loyola Mast, MD      Allergies    Patient has no known allergies.    Review of Systems   Review of Systems Negative except as per HPI Physical Exam Updated Vital Signs BP (!) 149/70   Pulse Marland Kitchen)  49   Temp 98 F (36.7 C) (Oral)   Resp 12   Ht 6' (1.829 m)   Wt 108.9 kg   SpO2 96%   BMI 32.55 kg/m  Physical Exam Vitals and nursing note reviewed.  Constitutional:      General: He is not in acute distress.    Appearance: He is well-developed. He is not diaphoretic.  HENT:     Head: Normocephalic and atraumatic.  Cardiovascular:     Rate and Rhythm: Normal rate and regular rhythm.     Pulses: Normal pulses.     Heart sounds: Normal heart sounds.  Pulmonary:     Effort: Pulmonary effort is normal.     Breath sounds: Normal breath sounds.  Abdominal:     Palpations: Abdomen is soft.     Tenderness: There is abdominal tenderness in the left lower quadrant.   Musculoskeletal:     Right lower leg: No edema.     Left lower leg: No edema.  Skin:    General: Skin is warm and dry.     Findings: No erythema or rash.  Neurological:     Mental Status: He is alert and oriented to person, place, and time.  Psychiatric:        Behavior: Behavior normal.     ED Results / Procedures / Treatments   Labs (all labs ordered are listed, but only abnormal results are displayed) Labs Reviewed  CBC WITH DIFFERENTIAL/PLATELET - Abnormal; Notable for the following components:      Result Value   Platelets 129 (*)    All other components within normal limits  COMPREHENSIVE METABOLIC PANEL - Abnormal; Notable for the following components:   Sodium 134 (*)    CO2 19 (*)    Glucose, Bld 109 (*)    BUN 25 (*)    Creatinine, Ser 1.36 (*)    GFR, Estimated 54 (*)    All other components within normal limits  LIPASE, BLOOD - Abnormal; Notable for the following components:   Lipase 54 (*)    All other components within normal limits  URINALYSIS, ROUTINE W REFLEX MICROSCOPIC - Abnormal; Notable for the following components:   Color, Urine STRAW (*)    Specific Gravity, Urine 1.034 (*)    Hgb urine dipstick MODERATE (*)    Bacteria, UA RARE (*)    All other components within normal limits    EKG EKG Interpretation Date/Time:  Monday July 25 2023 03:20:24 EST Ventricular Rate:  54 PR Interval:  62 QRS Duration:  99 QT Interval:  427 QTC Calculation: 405 R Axis:   60  Text Interpretation: Sinus rhythm Short PR interval No acute changes Confirmed by Drema Pry [phone]) on 07/25/2023 3:33:52 AM  Radiology CT ABDOMEN PELVIS W CONTRAST  Result Date: 07/25/2023 CLINICAL DATA:  77 year old male with left lower quadrant abdominal pain onset overnight. Recent constipation. Vomiting. EXAM: CT ABDOMEN AND PELVIS WITH CONTRAST TECHNIQUE: Multidetector CT imaging of the abdomen and pelvis was performed using the standard protocol following bolus  administration of intravenous contrast. RADIATION DOSE REDUCTION: This exam was performed according to the departmental dose-optimization program which includes automated exposure control, adjustment of the mA and/or kV according to patient size and/or use of iterative reconstruction technique. CONTRAST:  OMNIPAQUE IOHEXOL 350 MG/ML SOLN COMPARISON:  CT Abdomen and Pelvis 07/01/2022. FINDINGS: Lower chest: Stable borderline to mild cardiomegaly. Small chronic hiatal hernia. No pericardial or pleural effusion. Stable lung bases, mild subpleural scarring. Hepatobiliary: Negative  liver and gallbladder. Pancreas: Negative. Spleen: Negative. Adrenals/Urinary Tract: Normal adrenal glands. Nonobstructed right kidney with mostly punctate right nephrolithiasis. Hydronephrotic left kidney with pararenal edema, stranding. Left hydroureter and periureteral inflammation continues into the pelvis. And on series 3, image 82 there is a distal obstructing left ureteral calculus, seen also on coronal image 47 and about 4 mm diameter. This is 1 cm proximal to the left UVJ. Otherwise unremarkable bladder. Additional mostly punctate left nephrolithiasis. Delayed left nephrogram on the excretory phase images as expected. Normal right renal and ureteral contrast excretion. Stomach/Bowel: Decompressed large bowel from the splenic flexure distally. Widespread diverticulosis, from the right colon through the sigmoid. Below average volume of retained stool limited to the transverse and right colon. No active large bowel inflammation. Normal appendix series 3, image 59. Negative terminal ileum and nondilated small bowel. Small chronic gastric hiatal hernia. Stomach and duodenum otherwise negative. Chronic postoperative changes to the ventral lower abdominal wall. No abdominal wall hernia. No free air or free fluid. Vascular/Lymphatic: Aortoiliac calcified atherosclerosis. Major arterial structures remain patent. Normal caliber abdominal  aorta. Portal venous system appears to be patent. No lymphadenopathy identified. Reproductive: Chronic penile implant. Other: No pelvis free fluid. Bilateral inguinal and pelvic surgical clips are chronic. Musculoskeletal: Widespread flowing endplate osteophytes and interbody ankylosis in the lower thoracic and upper lumbar spine. Bulky lower lumbar endplate osteophytes without fusion. Severe lower lumbar facet hypertrophy. Previous lumbar laminectomy at some levels. No acute osseous abnormality identified. IMPRESSION: 1. Left Side Obstructive Uropathy due to a 4 mm distal left ureteral calculus, about 1 cm proximal to the UVJ. Additional mostly punctate bilateral nephrolithiasis. 2. Extensive large bowel diverticulosis, but no active inflammation. Below average volume of retained stool. Normal appendix. 3. Aortic Atherosclerosis (ICD10-I70.0). Small gastric hiatal hernia. Electronically Signed   By: Odessa Fleming M.D.   On: 07/25/2023 05:14    Procedures Procedures    Medications Ordered in ED Medications  ondansetron (ZOFRAN) injection 4 mg (4 mg Intravenous Given 07/25/23 0355)  morphine (PF) 4 MG/ML injection 4 mg (4 mg Intravenous Given 07/25/23 0357)  iohexol (OMNIPAQUE) 350 MG/ML injection 100 mL (100 mLs Intravenous Contrast Given 07/25/23 0432)  morphine (PF) 4 MG/ML injection 4 mg (4 mg Intravenous Given 07/25/23 0449)    ED Course/ Medical Decision Making/ A&P                                 Medical Decision Making Amount and/or Complexity of Data Reviewed Labs: ordered. Radiology: ordered.  Risk Prescription drug management.   This patient presents to the ED for concern of LLQ/back pain with vomiting, this involves an extensive number of treatment options, and is a complaint that carries with it a high risk of complications and morbidity.  The differential diagnosis includes but not limited to diverticulitis, kidney stone, constipation, mass   Co morbidities that complicate the  patient evaluation  Kidney stones, HLD, HTN, s/p radical prostatectomy with urinary incontinence, bladder cancer, prostate cancer.    Additional history obtained:  External records from outside source obtained and reviewed including prior labs on file   Lab Tests:  I Ordered, and personally interpreted labs.  The pertinent results include:  CBC with normal WBC. CMP with slightly elevated Cr to 1.36, previously 0.96 10 months ago. Lipase not significantly elevated at 54. UA without evidence of infection.    Imaging Studies ordered:  I ordered imaging studies including CT  a/p  I independently visualized and interpreted imaging which showed 4mm left distal ureteral stone I agree with the radiologist interpretation   Cardiac Monitoring: / EKG:  The patient was maintained on a cardiac monitor.  I personally viewed and interpreted the cardiac monitored which showed an underlying rhythm of: sinus rhythm, rate 54   Problem List / ED Course / Critical interventions / Medication management  77 year old male brought in by EMS from home with LLQ/back pain with 1 episode of vomiting. Patient is afebrile, WBC normal. Mildly elevated Cr. UA pending. CT with distal ureteral stone. Discussed results with patient. UA without evidence of infection.  referral to urology for follow up. DC on Flomax, Norco, Zofran with return to ER precautions.  I ordered medication including morphine, zofran  for pain  Reevaluation of the patient after these medicines showed that the patient improved. Did have brief transient hypoxia after 2nd dose of morphine with resolved with nasal canula and improved after [place] dc.  I have reviewed the patients home medicines and have made adjustments as needed   Social Determinants of Health:  Lives at home, son at bedside   Test / Admission - Considered:  Dispo pending at time of sign out to oncoming provider pending UA.          Final Clinical Impression(s) / ED  Diagnoses Final diagnoses:  Ureteral stone  Constipation, unspecified constipation type    Rx / DC Orders ED Discharge Orders          Ordered    HYDROcodone-acetaminophen (NORCO/VICODIN) 5-325 MG tablet  Every 4 hours PRN        07/25/23 0630    tamsulosin (FLOMAX) 0.4 MG CAPS capsule  Daily        07/25/23 0630    ondansetron (ZOFRAN-ODT) 4 MG disintegrating tablet  Every 8 hours PRN        07/25/23 0630              Jeannie Fend, PA-C 07/25/23 0631    Jeannie Fend, PA-C 07/25/23 [id number]    Nira Conn, MD 07/25/23 0800

## 2023-07-25 NOTE — ED Notes (Signed)
2LNC applied

## 2023-07-30 ENCOUNTER — Other Ambulatory Visit: Payer: Self-pay | Admitting: Family Medicine

## 2023-07-30 DIAGNOSIS — M109 Gout, unspecified: Secondary | ICD-10-CM

## 2023-08-03 ENCOUNTER — Other Ambulatory Visit: Payer: Self-pay | Admitting: Family Medicine

## 2023-08-03 DIAGNOSIS — I10 Essential (primary) hypertension: Secondary | ICD-10-CM

## 2023-08-11 ENCOUNTER — Other Ambulatory Visit: Payer: Self-pay | Admitting: Nurse Practitioner

## 2023-09-16 ENCOUNTER — Ambulatory Visit: Payer: Medicare Other | Admitting: Family Medicine

## 2023-09-30 ENCOUNTER — Encounter: Payer: Self-pay | Admitting: Family Medicine

## 2023-09-30 ENCOUNTER — Ambulatory Visit (INDEPENDENT_AMBULATORY_CARE_PROVIDER_SITE_OTHER): Payer: Medicare Other | Admitting: Family Medicine

## 2023-09-30 VITALS — BP 136/74 | HR 52 | Temp 96.9°F | Ht 72.0 in | Wt 253.4 lb

## 2023-09-30 DIAGNOSIS — K5900 Constipation, unspecified: Secondary | ICD-10-CM | POA: Diagnosis not present

## 2023-09-30 DIAGNOSIS — R5383 Other fatigue: Secondary | ICD-10-CM

## 2023-09-30 DIAGNOSIS — R053 Chronic cough: Secondary | ICD-10-CM

## 2023-09-30 DIAGNOSIS — K649 Unspecified hemorrhoids: Secondary | ICD-10-CM

## 2023-09-30 DIAGNOSIS — Z9189 Other specified personal risk factors, not elsewhere classified: Secondary | ICD-10-CM

## 2023-09-30 DIAGNOSIS — R600 Localized edema: Secondary | ICD-10-CM | POA: Diagnosis not present

## 2023-09-30 LAB — COMPREHENSIVE METABOLIC PANEL
ALT: 26 U/L (ref 0–53)
AST: 28 U/L (ref 0–37)
Albumin: 3.9 g/dL (ref 3.5–5.2)
Alkaline Phosphatase: 71 U/L (ref 39–117)
BUN: 19 mg/dL (ref 6–23)
CO2: 24 meq/L (ref 19–32)
Calcium: 9.9 mg/dL (ref 8.4–10.5)
Chloride: 106 meq/L (ref 96–112)
Creatinine, Ser: 1.03 mg/dL (ref 0.40–1.50)
GFR: 70 mL/min (ref >60.00)
Glucose, Bld: 100 mg/dL — ABNORMAL HIGH (ref 70–99)
Potassium: 4.2 meq/L (ref 3.5–5.1)
Sodium: 139 meq/L (ref 135–145)
Total Bilirubin: 0.4 mg/dL (ref 0.2–1.2)
Total Protein: 6.8 g/dL (ref 6.0–8.3)

## 2023-09-30 LAB — CBC
HCT: 42 % (ref 39.0–52.0)
Hemoglobin: 13.8 g/dL (ref 13.0–17.0)
MCHC: 33 g/dL (ref 30.0–36.0)
MCV: 91.3 fL (ref 78.0–100.0)
Platelets: 149 10*3/uL — ABNORMAL LOW (ref 150.0–400.0)
RBC: 4.6 Mil/uL (ref 4.22–5.81)
RDW: 16 % — ABNORMAL HIGH (ref 11.5–15.5)
WBC: 5.2 10*3/uL (ref 4.0–10.5)

## 2023-09-30 LAB — TSH: TSH: 2.42 u[IU]/mL (ref 0.35–5.50)

## 2023-09-30 LAB — VITAMIN B12: Vitamin B-12: 408 pg/mL (ref 211–911)

## 2023-09-30 MED ORDER — PANTOPRAZOLE SODIUM 40 MG PO TBEC
40.0000 mg | DELAYED_RELEASE_TABLET | Freq: Every day | ORAL | 3 refills | Status: DC
Start: 2023-09-30 — End: 2024-04-14

## 2023-09-30 NOTE — Progress Notes (Signed)
[facility] [address] [place] [address] Dept: [phone] Dept Fax: [phone]  Office Visit  Subjective:    Patient ID: [name], male    DOB: Dec 27, 1945, 78 y.o..   MRN: [id number]  Chief Complaint  Patient presents with   Cough    C/o having cough/wheezing  mostly at night.  Also has a hemorrhoid and swelling in RT leg. Fatigue.   History of Present Illness:  Patient is in today with multiple complaints.  [name] notes that he has persistent cough and wheezing that is heard by his wife at nights. This has been going on for some months. He does not typically note daytime symptoms, though he does occasionally have a throat clearing issue. He doesn't necessarily note heartburn. He has occasional allergy symptoms, but not worse than usual. He is currently using loratadine.  [name] notes his hemorrhoids are flared. He is using an OTC hemorrhoidal cream and adult wipes. he has issues with constipation. He is taking a daily fiber supplement and trying to improve his water intake.  [name] notes fatigue. He finds that he no longer has the same energy as int he past. His sleep is usually good, but he feels sleepy during the day and does nod off to sleep when things are quite at work.  [name] has been having chronic swelling of the right lower leg. He notes that in the past he had ain issue with blockage of a vein. He was treated with a blood thinner at one point and currently does take a daily aspirin. His orthopedist thought some of this may have related to his prior knee surgery.   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/2020   Essential hypertension 01/11/2020   Onychomycosis 10/12/2019   Hypothyroidism 10/12/2019   Hyperlipidemia 10/12/2019   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   Gout 05/03/2019   Personal history of prostate cancer 05/03/2019   Obesity (BMI 30.0-34.9) 04/26/2019   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   Past Surgical History:  Procedure Laterality Date   EYE SURGERY Bilateral 2014   HERNIA REPAIR Right 1984   inguinal   IR RADIOLOGIST EVAL & MGMT  03/19/2021   KNEE ARTHROSCOPY Left 1966   LUMBAR LAMINECTOMY/DECOMPRESSION MICRODISCECTOMY N/A 05/13/2022   Procedure: Microlumbar laminectomy Lumbar three-four , Lumbar four-five;  Surgeon: Jene Every, MD;  Location: [facility] OR;  Service: Orthopedics;  Laterality: N/A;   PENILE PROSTHESIS IMPLANT  2014   TOTAL KNEE ARTHROPLASTY Right 08/11/2020   Procedure: TOTAL KNEE ARTHROPLASTY;  Surgeon: Ollen Gross, MD;  Location: [facility] ORS;  Service: Orthopedics;  Laterality: Right;    TRANSURETHRAL RESECTION OF BLADDER TUMOR  2018   Family History  Problem Relation Age of Onset   Cancer Neg Hx    Outpatient Medications Prior to Visit  Medication Sig Dispense Refill   allopurinol (ZYLOPRIM) 100 MG tablet TAKE 1 TABLET(100 MG) BY MOUTH TWICE DAILY 180 tablet 3  aspirin EC 81 MG tablet Take 81 mg by mouth daily. Swallow whole.     carvedilol (COREG) 12.5 MG tablet TAKE 1 TABLET(12.5 MG) BY MOUTH TWICE DAILY WITH A MEAL 180 tablet 2   carvedilol (COREG) 25 MG tablet Take 0.5 tablets (12.5 mg total) by mouth 2 (two) times daily with a meal. TAKE 1 TABLET(25 MG) BY MOUTH TWICE DAILY 90 tablet 3   cholecalciferol (VITAMIN D3) 25 MCG (1000 UNIT) tablet Take 1,000 Units by mouth daily.      citalopram (CELEXA) 20 MG tablet Take 1 tablet (20 mg total) by mouth daily. 90 tablet 3   Coenzyme Q10 (COQ10 PO) Take 1 Dose by mouth daily. Liquid form     folic acid (FOLVITE) 800 MCG tablet Take 800 mcg by mouth daily.     HYDROcodone-acetaminophen (NORCO/VICODIN) 5-325 MG tablet Take 1 tablet by mouth every 4 (four) hours as needed. 10 tablet 0   levothyroxine (SYNTHROID) 88 MCG tablet TAKE 1 TABLET(88 MCG) BY MOUTH DAILY BEFORE BREAKFAST 90 tablet 3   loratadine (CLARITIN) 10 MG tablet Take 1 tablet (10 mg total) by mouth daily. 30 tablet 2   MAGNESIUM CITRATE PO Take by mouth.     ondansetron (ZOFRAN-ODT) 4 MG disintegrating tablet Take 1 tablet (4 mg total) by mouth every 8 (eight) hours as needed for nausea or vomiting. 12 tablet 0   OVER THE COUNTER MEDICATION Take 2 capsules by mouth daily. Brain Supplement     OVER THE COUNTER MEDICATION Take 2 tablets by mouth daily. Flush ( for toenail fungus)     OVER THE COUNTER MEDICATION Tablespoon of olive oil daily     rosuvastatin (CRESTOR) 10 MG tablet TAKE 1 TABLET(10 MG) BY MOUTH DAILY 90 tablet 3   tamsulosin (FLOMAX) 0.4 MG CAPS capsule Take 1 capsule (0.4 mg total) by mouth daily. 30 capsule 0   triamcinolone cream (KENALOG) 0.1 % Apply 1 Application topically 2 (two) times daily. 30 g 0   No facility-administered medications prior to visit.   No Known Allergies   Objective:   Today's Vitals   09/30/23 0842  BP: 136/74  Pulse: (!) 52  Temp: (!) 96.9 F (36.1 C)  TempSrc: Temporal  SpO2: 98%  Weight: 253 lb 6.4 oz (114.9 kg)  Height: 6' (1.829 m)   Body mass index is 34.37 kg/m.  Neck circumference 45 cm  General: Well developed, well nourished. No acute distress. HEENT: Normocephalic, non-traumatic. Mucous membranes moist. Oropharynx clear. Good dentition. Neck: Supple. No lymphadenopathy. No thyromegaly. Lungs: Clear to auscultation bilaterally. No wheezing, rales or rhonchi. CV: Bradycardic. RRR with a  low-grade murmur. Pulses 2+ bilaterally. Extremities: There is 3+ edema of the right lower leg from the knee down. Psych: Alert and oriented. Normal mood and affect.  Health Maintenance Due  Topic Date Due   DTaP/Tdap/Td (1 - Tdap) Never done   Zoster Vaccines- Shingrix (1 of 2) Never done   Medicare Annual Wellness (AWV)  12/03/2022     STOP-Bang Score for Sleep Apnea Screening  Patient Self-Reported Questions         Score Do you snore loudly? (louder than  0  talking or sufficiently loud to be  heard through doors)  Do you often feel tired, fatigued, or  1  sleepy during the daytime?  Has anyone observed you stop breathing 0  during sleep?  Do you have (or are you being treated for) 1  high blood pressure?  Clinical Information  Score BMI>35 kg/m2     0  Age > 50 years    1  Neck circumference > 40 cm   1  Gender (male)     1   Total      5  A score <3 indicates a low risk of sleep apnea.  Assessment & Plan:   Problem List Items Addressed This Visit   None Visit Diagnoses       Chronic cough    -  Primary   Discussed common etiologies. I recommend we have him on a PPI for 1 month and see if this improves.   Relevant Medications   pantoprazole (PROTONIX) 40 MG tablet     Constipation, unspecified constipation type       I recommend he use Miralax when he has been more than a day without a BM. Also provided information on additional fiber options.     Hemorrhoids, unspecified hemorrhoid type       Continue OTC cream until improved. Discussed fiber use.     Other fatigue       Etiology unclear. This may relate to sleep apnea. I willc heck labs to screen for common causes, but also refer for sleep study.   Relevant Orders   Comprehensive metabolic panel   TSH   CBC   Vitamin B12   Ambulatory referral to Sleep Studies     Edema of right lower leg       I suspect a venous cause to this. I will refer to vascular surgery for an evaluation.   Relevant  Orders   Ambulatory referral to Vascular Surgery     At risk for sleep apnea       Has risk factors for sleep apnea and chronic fatigue. I will refer for a sleep study.   Relevant Orders   Ambulatory referral to Sleep Studies       Return in about 4 weeks (around 10/28/2023) for Reassessment.   Loyola Mast, MD

## 2023-09-30 NOTE — Patient Instructions (Signed)
Constipation: Our goal is to achieve formed bowel movements daily or every-other-day.  You may need to try different combinations of the following options to find what works best for you - everybody's body works differently so feel free to adjust the dosages as needed.  Some options to help maintain bowel health include:   Dietary changes (more leafy greens, vegetables and fruits; less processed foods) Fiber supplementation (Benefiber, FiberCon, Metamucil or Psyllium). Start slow and increase gradually to full dose. Over-the-counter agents such as: stool softeners (Docusate or Colace) and/or laxatives (Miralax, milk of magnesia)  "Power Pudding" is a natural mixture that may help your constipation.  To make blend 1 cup applesauce, 1 cup wheat bran, and 3/4 cup prune juice, refrigerate and then take 1 tablespoon daily with a large glass of water as needed.  Green Latte  2 cups of spinach 2 teaspoons of matcha (non-sweet)  1 and half cup of diary (almond milk normally) 1 banana Some chia seeds and honey  Blend well in a blender

## 2023-10-27 ENCOUNTER — Other Ambulatory Visit: Payer: Self-pay | Admitting: Family Medicine

## 2023-10-27 DIAGNOSIS — E785 Hyperlipidemia, unspecified: Secondary | ICD-10-CM

## 2023-10-28 ENCOUNTER — Ambulatory Visit: Payer: Medicare Other | Admitting: Family Medicine

## 2023-11-06 ENCOUNTER — Other Ambulatory Visit: Payer: Self-pay | Admitting: Family Medicine

## 2023-11-06 DIAGNOSIS — F419 Anxiety disorder, unspecified: Secondary | ICD-10-CM

## 2023-11-21 ENCOUNTER — Other Ambulatory Visit: Payer: Self-pay

## 2023-11-21 DIAGNOSIS — R600 Localized edema: Secondary | ICD-10-CM

## 2023-11-29 ENCOUNTER — Ambulatory Visit: Admitting: Family Medicine

## 2023-11-30 NOTE — Progress Notes (Unsigned)
 Office Note     HPI: [name] is a 78 y.o. (23-Jul-1946) male who presents with known lower extremity edema following nonocclusive DVT.  [name] is well-known to our office, having been seen previous for waxing and waning swelling in the right lower extremity.  At that time, he was diagnosed with a DVT, which likely occurred after knee replacement in 2021 as he has had continued swelling since that time.  Duplex ultrasound in 2023 demonstrated nonocclusive DVT extending from the external iliac vein through the popliteal vein into the posterior tibial vein.  He was placed on 3 months of Xarelto and was not offered thrombectomy as symptoms were minimal and acuity was unknown.  On exam today, [name] is doing well.  He notes waxing waning swelling in the right leg.  Denies significant aching or throbbing, but does note that the leg is tired and heavy by days end.  Denies bleeding, denies ulceration, denies varicosities. He has worn compression stockings, but stopped 2 months ago stating they do not work.  No previous history of venous procedures.    Past Medical History:  Diagnosis Date   Arthritis    knees. back   Bladder cancer (HCC)    2018   High cholesterol    Hypertension    Hypothyroidism    prostate cancer    2000, 2003   Thyroid disease     Past Surgical History:  Procedure Laterality Date   EYE SURGERY Bilateral 2014   HERNIA REPAIR Right 1984   inguinal   IR RADIOLOGIST EVAL & MGMT  03/19/2021   KNEE ARTHROSCOPY Left 1966   LUMBAR LAMINECTOMY/DECOMPRESSION MICRODISCECTOMY N/A 05/13/2022   Procedure: Microlumbar laminectomy Lumbar three-four , Lumbar four-five;  Surgeon: Jene Every, MD;  Location: [facility] OR;  Service: Orthopedics;  Laterality: N/A;   PENILE PROSTHESIS IMPLANT  2014   TOTAL KNEE ARTHROPLASTY Right 08/11/2020   Procedure: TOTAL KNEE ARTHROPLASTY;  Surgeon: Ollen Gross, MD;  Location: [facility];  Service: Orthopedics;  Laterality: Right;    TRANSURETHRAL  RESECTION OF BLADDER TUMOR  2018    Social History   Socioeconomic History   Marital status: Married    Spouse name: Not on file   Number of children: Not on file   Years of education: Not on file   Highest education level: Not on file  Occupational History   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  Tobacco Use   Smoking status: Never    Passive exposure: Never   Smokeless tobacco: Never  Vaping Use   Vaping status: Former   Start date: 08/01/2017   Substances: THC  Substance and Sexual Activity   Alcohol use: Yes    Alcohol/week: 2.0 standard drinks of alcohol    Types: 2 Standard drinks or equivalent per week    Comment: rarely   Drug use: Not Currently    Types: Marijuana    Comment: none in at least 1 year   Sexual activity: Yes  Other Topics Concern   Not on file  Social History Narrative   Not on file   Social Drivers of Health   Financial Resource Strain: Low Risk  (12/02/2021)   Overall Financial Resource Strain (CARDIA)    Difficulty of Paying Living Expenses: Not hard at all  Food Insecurity: No Food Insecurity (12/02/2021)   Hunger Vital Sign    Worried About Running Out of Food in the Last Year: Never true    Ran Out of Food in the Last Year:  Never true  Transportation Needs: No Transportation Needs (12/02/2021)   PRAPARE - Administrator, Civil Service (Medical): No    Lack of Transportation (Non-Medical): No  Physical Activity: Insufficiently Active (12/02/2021)   Exercise Vital Sign    Days of Exercise per Week: 3 days    Minutes of Exercise per Session: 20 min  Stress: No Stress Concern Present (12/02/2021)   [name] [employer] - Occupational Stress Questionnaire    Feeling of Stress : Not at all  Social Connections: Unknown (01/12/2022)   Received from [facility]   Social Network    Social Network: Not on file  Intimate Partner Violence: Unknown (12/04/2021)   Received from [facility]    HITS    Physically Hurt: Not on file    Insult or Talk Down To: Not on file    Threaten Physical Harm: Not on file    Scream or Curse: Not on file   Family History  Problem Relation Age of Onset   Cancer Neg Hx     Current Outpatient Medications  Medication Sig Dispense Refill   allopurinol (ZYLOPRIM) 100 MG tablet TAKE 1 TABLET(100 MG) BY MOUTH TWICE DAILY 180 tablet 3   aspirin EC 81 MG tablet Take 81 mg by mouth daily. Swallow whole.     carvedilol (COREG) 12.5 MG tablet TAKE 1 TABLET(12.5 MG) BY MOUTH TWICE DAILY WITH A MEAL 180 tablet 2   carvedilol (COREG) 25 MG tablet Take 0.5 tablets (12.5 mg total) by mouth 2 (two) times daily with a meal. TAKE 1 TABLET(25 MG) BY MOUTH TWICE DAILY 90 tablet 3   cholecalciferol (VITAMIN D3) 25 MCG (1000 UNIT) tablet Take 1,000 Units by mouth daily.     citalopram (CELEXA) 20 MG tablet TAKE 1 TABLET(20 MG) BY MOUTH DAILY 90 tablet 3   Coenzyme Q10 (COQ10 PO) Take 1 Dose by mouth daily. Liquid form     folic acid (FOLVITE) 800 MCG tablet Take 800 mcg by mouth daily.     HYDROcodone-acetaminophen (NORCO/VICODIN) 5-325 MG tablet Take 1 tablet by mouth every 4 (four) hours as needed. 10 tablet 0   levothyroxine (SYNTHROID) 88 MCG tablet TAKE 1 TABLET(88 MCG) BY MOUTH DAILY BEFORE BREAKFAST 90 tablet 3   loratadine (CLARITIN) 10 MG tablet Take 1 tablet (10 mg total) by mouth daily. 30 tablet 2   MAGNESIUM CITRATE PO Take by mouth.     ondansetron (ZOFRAN-ODT) 4 MG disintegrating tablet Take 1 tablet (4 mg total) by mouth every 8 (eight) hours as needed for nausea or vomiting. 12 tablet 0   OVER THE COUNTER MEDICATION Take 2 capsules by mouth daily. Brain Supplement     OVER THE COUNTER MEDICATION Take 2 tablets by mouth daily. Flush ( for toenail fungus)     OVER THE COUNTER MEDICATION Tablespoon of olive oil daily     pantoprazole (PROTONIX) 40 MG tablet Take 1 tablet (40 mg total) by mouth daily. 30 tablet 3   rosuvastatin (CRESTOR) 10 MG tablet  TAKE 1 TABLET(10 MG) BY MOUTH DAILY 90 tablet 3   tamsulosin (FLOMAX) 0.4 MG CAPS capsule Take 1 capsule (0.4 mg total) by mouth daily. 30 capsule 0   triamcinolone cream (KENALOG) 0.1 % Apply 1 Application topically 2 (two) times daily. 30 g 0   No current facility-administered medications for this visit.    No Known Allergies   REVIEW OF SYSTEMS:  [X]  denotes positive finding, [ ]   denotes negative finding Cardiac  Comments:  Chest pain or chest pressure:    Shortness of breath upon exertion:    Short of breath when lying flat:    Irregular heart rhythm:        Vascular    Pain in calf, thigh, or hip brought on by ambulation:    Pain in feet at night that wakes you up from your sleep:     Blood clot in your veins:    Leg swelling:  X       Pulmonary    Oxygen at home:    Productive cough:     Wheezing:         Neurologic    Sudden weakness in arms or legs:     Sudden numbness in arms or legs:     Sudden onset of difficulty speaking or slurred speech:    Temporary loss of vision in one eye:     Problems with dizziness:         Gastrointestinal    Blood in stool:     Vomited blood:         Genitourinary    Burning when urinating:     Blood in urine:        Psychiatric    Major depression:         Hematologic    Bleeding problems:    Problems with blood clotting too easily:        Skin    Rashes or ulcers:        Constitutional    Fever or chills:      PHYSICAL EXAMINATION:  There were no vitals filed for this visit.  General:  WDWN in NAD; vital signs documented above Gait: Not observed HENT: WNL, normocephalic Pulmonary: normal non-labored breathing , without Rales, rhonchi,  wheezing Cardiac: regular HR Abdomen: soft, NT, no masses Skin: without rashes Vascular Exam/Pulses:  Right Left  Radial 2+ (normal) 2+ (normal)  Ulnar    Femoral    Popliteal    DP 2+ (normal) 2+ (normal)  PT     Extremities: without ischemic changes, without  Gangrene , without cellulitis; without open wounds;  Right lower extremity 2+ pitting edema. Musculoskeletal: no muscle wasting or atrophy  Neurologic: A&O X 3;  No focal weakness or paresthesias are detected Psychiatric:  The pt has Normal affect.   Non-Invasive Vascular Imaging:     Venous Reflux Times  +--------------+---------+------+-----------+------------+----------------+   RIGHT        Reflux NoRefluxReflux TimeDiameter cmsComments                                  Yes                                            +--------------+---------+------+-----------+------------+----------------+   CFV                    yes   >1 second                                +--------------+---------+------+-----------+------------+----------------+   FV mid                  yes   >1 second                                +--------------+---------+------+-----------+------------+----------------+  Popliteal              yes   >1 second                                +--------------+---------+------+-----------+------------+----------------+   GSV at [facility]    no                            0.66                       +--------------+---------+------+-----------+------------+----------------+   GSV prox thighno                            0.38                       +--------------+---------+------+-----------+------------+----------------+   GSV mid thigh no                            0.41                       +--------------+---------+------+-----------+------------+----------------+   GSV dist thighno                            0.36                       +--------------+---------+------+-----------+------------+----------------+   GSV at knee   no                            0.20                       +--------------+---------+------+-----------+------------+----------------+   SSV Pop Fossa           yes     >500 ms      0.24                       +--------------+---------+------+-----------+------------+----------------+   SSV prox calf           yes    >500 ms      0.40    chronic  thrombus  +--------------+---------+------+-----------+------------+----------------+          Summary:  Right:  - No evidence of deep vein thrombosis from the common femoral through the  popliteal veins.  - Chronic thrombus observed in the small saphenous vein.  - The deep venous system is not competent.  - The great saphenous vein is competent.  - The small saphenous vein is not competent.    *See table(s) above for measurements and observations.     ASSESSMENT/PLAN:: 78 y.o. male presenting with continued right lower extremity swelling with history of right lower extremity DVT.  On physical exam, he had 2+ pitting edema, with a palpable pulse in the foot. Venous reflux ultrasound demonstrated no DVT with some chronic thrombus in the small saphenous vein.  No reflux in the greater saphenous vein.  Reflux in the deep system. 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  We had a long discussion regarding post thrombotic syndrome, and the natural history.  Unfortunately, this diagnosis  cannot be treated surgically as the valves that are damaged are in the deep system.  [name] would be best served with continued elevation and compression with thigh-high compression stockings.  I am happy to compromise to knee-high compression stockings if he is able to wear them daily.  I gave him my card, and asked him to call should any questions or concerns arise. He can follow-up me as needed at this time.   Victorino Sparrow, MD [facility] [phone]

## 2023-12-01 ENCOUNTER — Encounter: Payer: Self-pay | Admitting: Vascular Surgery

## 2023-12-01 ENCOUNTER — Ambulatory Visit (INDEPENDENT_AMBULATORY_CARE_PROVIDER_SITE_OTHER): Payer: Medicare Other | Admitting: Vascular Surgery

## 2023-12-01 ENCOUNTER — Ambulatory Visit (HOSPITAL_COMMUNITY)
Admission: RE | Admit: 2023-12-01 | Discharge: 2023-12-01 | Disposition: A | Discharge status: Home / Self Care | Payer: Medicare Other | Source: Ambulatory Visit | Attending: Vascular Surgery | Admitting: Vascular Surgery

## 2023-12-01 VITALS — BP 164/78 | HR 53 | Temp 98.2°F | Ht 72.0 in | Wt 258.0 lb

## 2023-12-01 DIAGNOSIS — R600 Localized edema: Secondary | ICD-10-CM | POA: Diagnosis not present

## 2023-12-01 DIAGNOSIS — I87001 Postthrombotic syndrome without complications of right lower extremity: Secondary | ICD-10-CM

## 2023-12-01 DIAGNOSIS — M7989 Other specified soft tissue disorders: Secondary | ICD-10-CM

## 2023-12-23 ENCOUNTER — Ambulatory Visit: Admitting: Nurse Practitioner

## 2024-01-02 ENCOUNTER — Encounter: Payer: Self-pay | Admitting: Family Medicine

## 2024-01-02 ENCOUNTER — Ambulatory Visit (INDEPENDENT_AMBULATORY_CARE_PROVIDER_SITE_OTHER): Admitting: Family Medicine

## 2024-01-02 VITALS — BP 140/82 | HR 47 | Temp 97.7°F | Ht 72.0 in | Wt 253.6 lb

## 2024-01-02 DIAGNOSIS — J4520 Mild intermittent asthma, uncomplicated: Secondary | ICD-10-CM | POA: Diagnosis not present

## 2024-01-02 DIAGNOSIS — R0601 Orthopnea: Secondary | ICD-10-CM | POA: Diagnosis not present

## 2024-01-02 LAB — BRAIN NATRIURETIC PEPTIDE: Pro B Natriuretic peptide (BNP): 53 pg/mL (ref 0.0–100.0)

## 2024-01-02 MED ORDER — PREDNISONE 10 MG PO TABS: 10.0000 mg | ORAL_TABLET | Freq: Two times a day (BID) | ORAL | 0 refills | Status: AC

## 2024-01-02 NOTE — Progress Notes (Signed)
 Established Patient Office Visit   Subjective:  Patient ID: [name], male    DOB: [birth year]  Age: 78 y.o. MRN: [id number]  Chief Complaint  Patient presents with   Wheezing    Wheezing with a dry cough at night. X 2 weeks. No headaches, N/V fever or chills.     Wheezing  Associated symptoms include coughing. Pertinent negatives include no abdominal pain, chest pain, chills, fever, hemoptysis, rash, shortness of breath or sputum production.   Encounter Diagnoses  Name Primary?   Mild intermittent reactive airway disease without complication Yes   Orthopnea    2-week history with a dry cough associated with wheezing.  He has been treating it with Claritin  with mild relief.  He cannot sleep lying down.  He has no shortness of breath with exertion.  History of venous insufficiency with ongoing lower extremity edema.  He has no history of asthma or tobacco use.  No history of CHF.  Echocardiogram in February 2004 showed normal left ventricular systolic function but with some diastolic dysfunction.  History of apnea that is currently untreated.  His wife is told him that he does not snore when he lays on his side.   Review of Systems  Constitutional: Negative.  Negative for chills and fever.  HENT: Negative.    Eyes:  Negative for blurred vision, discharge and redness.  Respiratory:  Positive for cough and wheezing. Negative for hemoptysis, sputum production and shortness of breath.   Cardiovascular:  Positive for orthopnea. Negative for chest pain.  Gastrointestinal:  Negative for abdominal pain.  Genitourinary: Negative.   Musculoskeletal: Negative.  Negative for myalgias.  Skin:  Negative for rash.  Neurological:  Negative for tingling, loss of consciousness and weakness.  Endo/Heme/Allergies:  Negative for polydipsia.     Current Outpatient Medications:    allopurinol  (ZYLOPRIM ) 100 MG tablet, TAKE 1 TABLET(100 MG) BY MOUTH TWICE DAILY, Disp: 180 tablet, Rfl: 3   aspirin   EC 81 MG tablet, Take 81 mg by mouth daily. Swallow whole., Disp: , Rfl:    carvedilol  (COREG ) 12.5 MG tablet, TAKE 1 TABLET(12.5 MG) BY MOUTH TWICE DAILY WITH A MEAL, Disp: 180 tablet, Rfl: 2   carvedilol  (COREG ) 25 MG tablet, Take 0.5 tablets (12.5 mg total) by mouth 2 (two) times daily with a meal. TAKE 1 TABLET(25 MG) BY MOUTH TWICE DAILY, Disp: 90 tablet, Rfl: 3   cholecalciferol (VITAMIN D3) 25 MCG (1000 UNIT) tablet, Take 1,000 Units by mouth daily., Disp: , Rfl:    citalopram  (CELEXA ) 20 MG tablet, TAKE 1 TABLET(20 MG) BY MOUTH DAILY, Disp: 90 tablet, Rfl: 3   Coenzyme Q10 (COQ10 PO), Take 1 Dose by mouth daily. Liquid form, Disp: , Rfl:    folic acid  (FOLVITE ) 800 MCG tablet, Take 800 mcg by mouth daily., Disp: , Rfl:    HYDROcodone -acetaminophen  (NORCO/VICODIN) 5-325 MG tablet, Take 1 tablet by mouth every 4 (four) hours as needed., Disp: 10 tablet, Rfl: 0   levothyroxine  (SYNTHROID ) 88 MCG tablet, TAKE 1 TABLET(88 MCG) BY MOUTH DAILY BEFORE BREAKFAST, Disp: 90 tablet, Rfl: 3   loratadine  (CLARITIN ) 10 MG tablet, Take 1 tablet (10 mg total) by mouth daily., Disp: 30 tablet, Rfl: 2   MAGNESIUM  CITRATE PO, Take by mouth., Disp: , Rfl:    ondansetron  (ZOFRAN -ODT) 4 MG disintegrating tablet, Take 1 tablet (4 mg total) by mouth every 8 (eight) hours as needed for nausea or vomiting., Disp: 12 tablet, Rfl: 0   OVER THE COUNTER MEDICATION, Take  2 capsules by mouth daily. Brain Supplement, Disp: , Rfl:    OVER THE COUNTER MEDICATION, Take 2 tablets by mouth daily. Flush ( for toenail fungus), Disp: , Rfl:    OVER THE COUNTER MEDICATION, Tablespoon of olive oil daily, Disp: , Rfl:    pantoprazole  (PROTONIX ) 40 MG tablet, Take 1 tablet (40 mg total) by mouth daily., Disp: 30 tablet, Rfl: 3   predniSONE  (DELTASONE ) 10 MG tablet, Take 1 tablet (10 mg total) by mouth 2 (two) times daily with a meal for 7 days., Disp: 14 tablet, Rfl: 0   rosuvastatin  (CRESTOR ) 10 MG tablet, TAKE 1 TABLET(10 MG) BY MOUTH  DAILY, Disp: 90 tablet, Rfl: 3   tamsulosin  (FLOMAX ) 0.4 MG CAPS capsule, Take 1 capsule (0.4 mg total) by mouth daily., Disp: 30 capsule, Rfl: 0   triamcinolone  cream (KENALOG ) 0.1 %, Apply 1 Application topically 2 (two) times daily., Disp: 30 g, Rfl: 0   Objective:     BP (!) 140/82 (BP Location: Right Arm, Patient Position: Sitting, Cuff Size: Large)   Pulse (!) 47   Temp 97.7 F (36.5 C) (Temporal)   Ht 6' (1.829 m)   Wt 253 lb 9.6 oz (115 kg)   SpO2 97%   BMI 34.39 kg/m    Physical Exam Constitutional:      General: He is not in acute distress.    Appearance: Normal appearance. He is not ill-appearing, toxic-appearing or diaphoretic.  HENT:     Head: Normocephalic and atraumatic.     Right Ear: External ear normal.     Left Ear: External ear normal.     Mouth/Throat:     Mouth: Mucous membranes are moist.     Pharynx: Oropharynx is clear. No oropharyngeal exudate or posterior oropharyngeal erythema.  Eyes:     General: No scleral icterus.       Right eye: No discharge.        Left eye: No discharge.     Extraocular Movements: Extraocular movements intact.     Conjunctiva/sclera: Conjunctivae normal.     Pupils: Pupils are equal, round, and reactive to light.  Cardiovascular:     Rate and Rhythm: Normal rate and regular rhythm.  Pulmonary:     Effort: Pulmonary effort is normal. No respiratory distress.     Breath sounds: Normal breath sounds. No wheezing, rhonchi or rales.  Abdominal:     General: Bowel sounds are normal.     Tenderness: There is no abdominal tenderness. There is no guarding.  Musculoskeletal:     Cervical back: No rigidity or tenderness.     Right lower leg: Edema present.     Left lower leg: Edema present.  Skin:    General: Skin is warm and dry.  Neurological:     Mental Status: He is alert and oriented to person, place, and time.  Psychiatric:        Mood and Affect: Mood normal.        Behavior: Behavior normal.      No results  found for any visits on 01/02/24.    The 10-year ASCVD risk score (Arnett DK, et al., 2019) is: 36.5%    Assessment & Plan:   Mild intermittent reactive airway disease without complication -     predniSONE ; Take 1 tablet (10 mg total) by mouth 2 (two) times daily with a meal for 7 days.  Dispense: 14 tablet; Refill: 0  Orthopnea -     Brain natriuretic peptide  Return Follow-up with Dr. Therese Flash if not improving over the next week.Percy Bracken course of prednisone .  Checking BNP.  Consider cardiology referral if elevated Tonna Frederic, MD

## 2024-01-03 ENCOUNTER — Encounter: Payer: Self-pay | Admitting: Family Medicine

## 2024-01-20 ENCOUNTER — Encounter: Payer: Self-pay | Admitting: Family Medicine

## 2024-01-20 ENCOUNTER — Ambulatory Visit (INDEPENDENT_AMBULATORY_CARE_PROVIDER_SITE_OTHER): Admitting: Family Medicine

## 2024-01-20 VITALS — BP 128/74 | HR 59 | Temp 97.6°F | Ht 72.0 in | Wt 255.0 lb

## 2024-01-20 DIAGNOSIS — J40 Bronchitis, not specified as acute or chronic: Secondary | ICD-10-CM | POA: Diagnosis not present

## 2024-01-20 MED ORDER — PREDNISONE 20 MG PO TABS: 20.0000 mg | ORAL_TABLET | Freq: Every day | ORAL | 0 refills | Status: DC

## 2024-01-20 MED ORDER — ALBUTEROL SULFATE HFA 108 (90 BASE) MCG/ACT IN AERS: INHALATION_SPRAY | RESPIRATORY_TRACT | 0 refills | Status: DC

## 2024-01-20 NOTE — Progress Notes (Signed)
 [facility] [address] [place] [address] Dept: [phone] Dept Fax: [phone]  Office Visit  Subjective:    Patient ID: [name], male    DOB: September 02, 1945, 78 y.o..   MRN: [id number]  Chief Complaint  Patient presents with   Cough    C/o having a persistent cough and wheezing x 2 weeks.      History of Present Illness:  Patient is in today for for reassessment of a persistent cough. [name] saw Dr. Tilmon Font on 5/5 with a 2-week history of wheezing and dry cough at night. He was treated with a course of prednisone , which he feels did help his symptoms quite a bit. However, now that he is off the steroid, his symptoms have recurred, though not as severe.  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/2020   Essential hypertension 01/11/2020   Onychomycosis 10/12/2019   Hypothyroidism 10/12/2019   Hyperlipidemia 10/12/2019   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   Gout 05/03/2019   Personal history of prostate cancer 05/03/2019   Obesity (BMI 30.0-34.9) 04/26/2019   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   Past Surgical History:  Procedure Laterality Date   EYE SURGERY Bilateral 2014   HERNIA REPAIR Right 1984   inguinal   IR RADIOLOGIST EVAL & MGMT  03/19/2021   KNEE ARTHROSCOPY Left 1966   LUMBAR LAMINECTOMY/DECOMPRESSION MICRODISCECTOMY N/A 05/13/2022   Procedure:  Microlumbar laminectomy Lumbar three-four , Lumbar four-five;  Surgeon: Orvan Blanch, MD;  Location: [facility] OR;  Service: Orthopedics;  Laterality: N/A;   PENILE PROSTHESIS IMPLANT  2014   TOTAL KNEE ARTHROPLASTY Right 08/11/2020   Procedure: TOTAL KNEE ARTHROPLASTY;  Surgeon: Liliane Rei, MD;  Location: [facility];  Service: Orthopedics;  Laterality: Right;    TRANSURETHRAL RESECTION OF BLADDER TUMOR  2018   Family History  Problem Relation Age of Onset   Cancer Neg Hx    Outpatient Medications Prior to Visit  Medication Sig Dispense Refill   allopurinol  (ZYLOPRIM ) 100 MG tablet TAKE 1 TABLET(100 MG) BY MOUTH TWICE DAILY 180 tablet 3   aspirin  EC 81 MG tablet Take 81 mg by mouth daily. Swallow whole.     carvedilol  (COREG ) 25 MG tablet Take 0.5 tablets (12.5 mg total) by mouth 2 (two) times daily with a meal. TAKE 1 TABLET(25 MG) BY MOUTH TWICE DAILY 90 tablet 3   cholecalciferol (VITAMIN D3) 25 MCG (1000 UNIT) tablet Take 1,000 Units by mouth daily.     citalopram  (CELEXA ) 20 MG tablet TAKE 1 TABLET(20 MG) BY MOUTH DAILY 90 tablet 3   Coenzyme Q10 (COQ10 PO) Take 1 Dose by mouth daily. Liquid form     folic acid  (FOLVITE ) 800 MCG tablet Take 800 mcg by mouth daily.     loratadine  (CLARITIN ) 10 MG tablet Take 1 tablet (10 mg total) by mouth daily. 30 tablet 2   OVER THE COUNTER MEDICATION Take 2 capsules by mouth daily. Brain Supplement  OVER THE COUNTER MEDICATION Tablespoon of olive oil daily     pantoprazole  (PROTONIX ) 40 MG tablet Take 1 tablet (40 mg total) by mouth daily. 30 tablet 3   rosuvastatin  (CRESTOR ) 10 MG tablet TAKE 1 TABLET(10 MG) BY MOUTH DAILY 90 tablet 3   carvedilol  (COREG ) 12.5 MG tablet TAKE 1 TABLET(12.5 MG) BY MOUTH TWICE DAILY WITH A MEAL 180 tablet 2   levothyroxine  (SYNTHROID ) 88 MCG tablet TAKE 1 TABLET(88 MCG) BY MOUTH DAILY BEFORE BREAKFAST 90 tablet 3   MAGNESIUM  CITRATE PO Take by mouth. (Patient not taking: Reported on 01/20/2024)     OVER THE  COUNTER MEDICATION Take 2 tablets by mouth daily. Flush ( for toenail fungus) (Patient not taking: Reported on 01/20/2024)     tamsulosin  (FLOMAX ) 0.4 MG CAPS capsule Take 1 capsule (0.4 mg total) by mouth daily. (Patient not taking: Reported on 01/20/2024) 30 capsule 0   triamcinolone  cream (KENALOG ) 0.1 % Apply 1 Application topically 2 (two) times daily. (Patient not taking: Reported on 01/20/2024) 30 g 0   HYDROcodone -acetaminophen  (NORCO/VICODIN) 5-325 MG tablet Take 1 tablet by mouth every 4 (four) hours as needed. 10 tablet 0   ondansetron  (ZOFRAN -ODT) 4 MG disintegrating tablet Take 1 tablet (4 mg total) by mouth every 8 (eight) hours as needed for nausea or vomiting. 12 tablet 0   No facility-administered medications prior to visit.   No Known Allergies   Objective:   Today's Vitals   01/20/24 1540  BP: 128/74  Pulse: (!) 59  Temp: 97.6 F (36.4 C)  TempSrc: Temporal  SpO2: 94%  Weight: 255 lb (115.7 kg)  Height: 6' (1.829 m)   Body mass index is 34.58 kg/m.   General: Well developed, well nourished. No acute distress. Lungs: Clear to auscultation bilaterally when upright. When lying down, I auscultate an end-expiratory wheeze.   anteriorly over the right chest.  Psych: Alert and oriented. Normal mood and affect.  Health Maintenance Due  Topic Date Due   DTaP/Tdap/Td (1 - Tdap) Never done   Zoster Vaccines- Shingrix (1 of 2) Never done   Medicare Annual Wellness (AWV)  12/03/2022     Assessment & Plan:   Problem List Items Addressed This Visit   None Visit Diagnoses       Bronchitis    -  Primary   Discussed natural history of cough associated with bronchitis. I recommend we extend the steroid course and add albuterol  at bedtime.   Relevant Medications   predniSONE  (DELTASONE ) 20 MG tablet   albuterol  (VENTOLIN  HFA) 108 (90 Base) MCG/ACT inhaler       Return in about 3 months (around 04/21/2024) for Reassessment, sooner if symptoms persist..   Graig Lawyer,  MD

## 2024-01-22 ENCOUNTER — Other Ambulatory Visit: Payer: Self-pay | Admitting: Family Medicine

## 2024-01-22 DIAGNOSIS — E039 Hypothyroidism, unspecified: Secondary | ICD-10-CM

## 2024-01-30 ENCOUNTER — Ambulatory Visit: Payer: Self-pay

## 2024-01-30 NOTE — Telephone Encounter (Signed)
  Chief Complaint: cough x 3 week no better Symptoms: coughing episodes, unable to lay down flat, yellow phlegm  Frequency: 3 weeks Pertinent Negatives: Patient denies wheezing Disposition: [] ED /[] Urgent Care (no appt availability in office) / [x] Appointment(In office/virtual)/ []  [facility]/ [] Home Care/ [] Refused Recommended Disposition /[] [facility]/ []  Follow-up with PCP Additional Notes:  Copied from CRM [id number]. Topic: Clinical - Red Word Triage >> Jan 30, 2024 10:07 AM [place] J wrote: [facility] that prompted transfer to Nurse Triage: Patient states that he's been struggling with bronchitis for 3 weeks now and it's been worsening. Symptoms consist of heaviness in his chest, coughing, not being able to sleep. Reason for Disposition  SEVERE coughing spells (e.g., whooping sound after coughing, vomiting after coughing)  Answer Assessment - Initial Assessment Questions 1. ONSET: "When did the cough begin?"      3 weeks ago  2. SEVERITY: "How bad is the cough today?"      Keeps up at ngitht 3. SPUTUM: "Describe the color of your sputum" (none, dry cough; clear, white, yellow, green)     White to yellow  4. HEMOPTYSIS: "Are you coughing up any blood?" If so ask: "How much?" (flecks, streaks, tablespoons, etc.)     no 5. DIFFICULTY BREATHING: "Are you having difficulty breathing?" If Yes, ask: "How bad is it?" (e.g., mild, moderate, severe)    - MILD: No SOB at rest, mild SOB with walking, speaks normally in sentences, can lie down, no retractions, pulse < 100.    - MODERATE: SOB at rest, SOB with minimal exertion and prefers to sit, cannot lie down flat, speaks in phrases, mild retractions, audible wheezing, pulse 100-120.    - SEVERE: Very SOB at rest, speaks in single words, struggling to breathe, sitting hunched forward, retractions, pulse > 120      mod 6. FEVER: "Do you have a fever?" If Yes, ask: "What is your temperature, how was it measured, and when did it  start?"     no 10. OTHER SYMPTOMS: "Do you have any other symptoms?" (e.g., runny nose, wheezing, chest pain)       bronchitis  Protocols used: Cough - Acute Productive-A-AH

## 2024-01-30 NOTE — Telephone Encounter (Signed)
 Has an appointment 01/31/24. Dm/cma

## 2024-01-31 ENCOUNTER — Ambulatory Visit (INDEPENDENT_AMBULATORY_CARE_PROVIDER_SITE_OTHER): Admitting: Family Medicine

## 2024-01-31 ENCOUNTER — Encounter: Payer: Self-pay | Admitting: Family Medicine

## 2024-01-31 ENCOUNTER — Ambulatory Visit: Admitting: Family Medicine

## 2024-01-31 VITALS — BP 124/76 | HR 62 | Temp 97.0°F | Ht 72.0 in | Wt 253.2 lb

## 2024-01-31 DIAGNOSIS — J189 Pneumonia, unspecified organism: Secondary | ICD-10-CM | POA: Diagnosis not present

## 2024-01-31 DIAGNOSIS — E785 Hyperlipidemia, unspecified: Secondary | ICD-10-CM

## 2024-01-31 DIAGNOSIS — E039 Hypothyroidism, unspecified: Secondary | ICD-10-CM

## 2024-01-31 DIAGNOSIS — J4520 Mild intermittent asthma, uncomplicated: Secondary | ICD-10-CM

## 2024-01-31 DIAGNOSIS — I10 Essential (primary) hypertension: Secondary | ICD-10-CM | POA: Diagnosis not present

## 2024-01-31 MED ORDER — PREDNISONE 20 MG PO TABS: ORAL_TABLET | ORAL | 0 refills | Status: AC

## 2024-01-31 MED ORDER — AMOXICILLIN-POT CLAVULANATE 875-125 MG PO TABS: 1.0000 | ORAL_TABLET | Freq: Two times a day (BID) | ORAL | 0 refills | Status: DC

## 2024-01-31 MED ORDER — DOXYCYCLINE HYCLATE 100 MG PO TABS: 100.0000 mg | ORAL_TABLET | Freq: Two times a day (BID) | ORAL | 0 refills | Status: DC

## 2024-01-31 NOTE — Assessment & Plan Note (Signed)
Blood pressure is in good control. Continue carvedilol 12.5 mg bid.

## 2024-01-31 NOTE — Assessment & Plan Note (Signed)
Stable.  Continue levothyroxine 83mg daily

## 2024-01-31 NOTE — Assessment & Plan Note (Signed)
 Lipids are at goal. Continue rosuvastatin 10 mg daily.

## 2024-01-31 NOTE — Assessment & Plan Note (Signed)
 Clinically, [name] has developed a right lower lob pneumonia. Based on his age, I will treat him with both Augmentin  and doxycycline. I will order a CXR and plan to see him back in 2 weeks.

## 2024-01-31 NOTE — Progress Notes (Signed)
 [facility]ARE [facility] [address] [place] [address] Dept: [phone] Dept Fax: [phone]  Chronic Care Office Visit  Subjective:    Patient ID: [name], male    DOB: July 13, 1946, 78 y.o..   MRN: [id number]  Chief Complaint  Patient presents with   Cough    C/o still having a persistent cough/chest congestion x 3 weeks.   Has taken Mucinex with no relief.      History of Present Illness:  Patient is in today for reassessment of chronic medical issues.  [name] has a history of hypertension. He is managed on carvedilol  12.5 mg bid.    [name] has a history of hypothyroidism. He is managed on levothyroxine  88 mcg daily.   [name] has a history of hyperlipidemia. He is managed on rosuvastatin  10 mg daily.   [name] saw Dr. Tilmon [name] on 5/5 with a 2-week history of wheezing and dry cough at night. He was treated with a course of prednisone , which he feels did help his symptoms quite a bit. However, off the steroid, his symptoms recurred, though not as severe. I saw him on 5/23 and added an albuterol  inhaler to his regimen. He notes at this point, the cough persists. It has reached the point where he cannot lie down in bed, as his cough worsens at that point.   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/2020   Essential hypertension 01/11/2020   Onychomycosis 10/12/2019   Hypothyroidism  10/12/2019   Hyperlipidemia 10/12/2019   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   Gout 05/03/2019   Personal history of prostate cancer 05/03/2019   Obesity (BMI 30.0-34.9) 04/26/2019   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   Past Surgical History:  Procedure Laterality Date   EYE SURGERY Bilateral 2014   HERNIA REPAIR Right 1984   inguinal   IR RADIOLOGIST EVAL & MGMT  03/19/2021   KNEE ARTHROSCOPY Left 1966   LUMBAR LAMINECTOMY/DECOMPRESSION MICRODISCECTOMY N/A 05/13/2022   Procedure: Microlumbar laminectomy Lumbar three-four , Lumbar four-five;  Surgeon: Orvan Blanch, MD;  Location: [facility] OR;  Service: Orthopedics;  Laterality: N/A;   PENILE PROSTHESIS IMPLANT  2014   TOTAL KNEE ARTHROPLASTY Right 08/11/2020   Procedure: TOTAL KNEE ARTHROPLASTY;  Surgeon: Liliane Rei, MD;  Location: [facility] ORS;  Service: Orthopedics;  Laterality: Right;    TRANSURETHRAL RESECTION OF BLADDER TUMOR  2018   Family History  Problem Relation Age of Onset   Cancer Neg Hx    Outpatient Medications Prior to Visit  Medication Sig Dispense Refill   albuterol  (VENTOLIN  HFA) 108 (90 Base) MCG/ACT inhaler Inhale 2 puffs into the lungs at bedtime as needed for nighttime cough/wheezing 6.7 g 0   allopurinol  (ZYLOPRIM ) 100 MG tablet TAKE 1 TABLET(100 MG) BY MOUTH TWICE DAILY 180 tablet 3   aspirin  EC 81 MG tablet Take 81 mg by mouth daily. Swallow whole.     carvedilol  (COREG ) 25 MG tablet Take 0.5 tablets (12.5 mg  total) by mouth 2 (two) times daily with a meal. TAKE 1 TABLET(25 MG) BY MOUTH TWICE DAILY 90 tablet 3   cholecalciferol (VITAMIN D3) 25 MCG (1000 UNIT) tablet Take 1,000 Units by mouth daily.     citalopram  (CELEXA ) 20 MG tablet TAKE 1 TABLET(20 MG) BY MOUTH DAILY 90 tablet 3   Coenzyme Q10 (COQ10 PO) Take 1 Dose by mouth daily. Liquid form     folic acid  (FOLVITE ) 800 MCG tablet Take 800 mcg by mouth daily.     levothyroxine  (SYNTHROID ) 88 MCG tablet TAKE 1 TABLET(88 MCG) BY MOUTH  DAILY BEFORE BREAKFAST 90 tablet 1   loratadine  (CLARITIN ) 10 MG tablet Take 1 tablet (10 mg total) by mouth daily. 30 tablet 2   OVER THE COUNTER MEDICATION Take 2 capsules by mouth daily. Brain Supplement     OVER THE COUNTER MEDICATION Tablespoon of olive oil daily     rosuvastatin  (CRESTOR ) 10 MG tablet TAKE 1 TABLET(10 MG) BY MOUTH DAILY 90 tablet 3   pantoprazole  (PROTONIX ) 40 MG tablet Take 1 tablet (40 mg total) by mouth daily. (Patient not taking: Reported on 01/31/2024) 30 tablet 3   predniSONE  (DELTASONE ) 20 MG tablet Take 1 tablet (20 mg total) by mouth daily with breakfast. 10 tablet 0   No facility-administered medications prior to visit.   No Known Allergies Objective:   Today's Vitals   01/31/24 1325  BP: 124/76  Pulse: 62  Temp: (!) 97 F (36.1 C)  TempSrc: Temporal  SpO2: 98%  Weight: 253 lb 3.2 oz (114.9 kg)  Height: 6' (1.829 m)   Body mass index is 34.34 kg/m.   General: Well developed, well nourished. No acute distress. Lungs: Rhonchi present int he right lower lung field and some wheezing present in the mid chest. Psych: Alert and oriented. Normal mood and affect.  Health Maintenance Due  Topic Date Due   DTaP/Tdap/Td (1 - Tdap) Never done   Zoster Vaccines- Shingrix (1 of 2) Never done   Medicare Annual Wellness (AWV)  12/03/2022     Assessment & Plan:   Problem List Items Addressed This Visit       Cardiovascular and Mediastinum   Essential hypertension   Blood pressure is in good control. Continue carvedilol  12.5 mg bid.        Respiratory   Pneumonia of right lower lobe due to infectious organism - Primary   Clinically, [name] has developed a right lower lob pneumonia. Based on his age, I will treat him with both Augmentin  and doxycycline. I will order a CXR and plan to see him back in 2 weeks.      Relevant Medications   amoxicillin -clavulanate (AUGMENTIN ) 875-125 MG tablet   doxycycline (VIBRA-TABS) 100 MG tablet   Other Relevant  Orders   DG Chest 2 View     Endocrine   Hypothyroidism   Stable. Continue levothyroxine  88 mcg daily.        Other   Hyperlipidemia   Lipids are at goal. Continue rosuvastatin  10 mg daily.      Other Visit Diagnoses       Mild intermittent reactive airway disease without complication       We will try another course of prednisone  to relieve his wheezing.   Relevant Medications   predniSONE  (DELTASONE ) 20 MG tablet       Return in about 2 weeks (around 02/14/2024) for Reassessment.   Graig Lawyer, MD

## 2024-02-06 ENCOUNTER — Ambulatory Visit (INDEPENDENT_AMBULATORY_CARE_PROVIDER_SITE_OTHER)

## 2024-02-06 DIAGNOSIS — J189 Pneumonia, unspecified organism: Secondary | ICD-10-CM

## 2024-02-07 ENCOUNTER — Ambulatory Visit: Payer: Self-pay | Admitting: Family Medicine

## 2024-02-10 ENCOUNTER — Ambulatory Visit

## 2024-02-15 ENCOUNTER — Ambulatory Visit (INDEPENDENT_AMBULATORY_CARE_PROVIDER_SITE_OTHER): Admitting: Family Medicine

## 2024-02-15 ENCOUNTER — Encounter: Payer: Self-pay | Admitting: Family Medicine

## 2024-02-15 VITALS — BP 130/74 | HR 52 | Temp 97.0°F | Ht 72.0 in | Wt 255.0 lb

## 2024-02-15 DIAGNOSIS — J189 Pneumonia, unspecified organism: Secondary | ICD-10-CM | POA: Diagnosis not present

## 2024-02-15 DIAGNOSIS — J4520 Mild intermittent asthma, uncomplicated: Secondary | ICD-10-CM | POA: Diagnosis not present

## 2024-02-15 NOTE — Assessment & Plan Note (Signed)
 Clinically, [name] had pneumonia. His lung exam is clear today and his symptoms have improved. I feel the lingering cough will likely resolve over the next 1-2 weeks. If this persists, we would consider referral to pulmonology.

## 2024-02-15 NOTE — Progress Notes (Signed)
 [facility] [address] [place] [address] Dept: [phone] Dept Fax: [phone]  Office Visit  Subjective:    Patient ID: [name], male    DOB: 10-01-1945, 78 y.o..   MRN: [id number]  Chief Complaint  Patient presents with   Follow-up    2 week f/u.  Still coughing some and sleeping better.    History of Present Illness:  Patient is in today for reassessment after a recent respiratory illness. [name] saw Dr. Tilmon Font on 5/5 with a 2-week history of wheezing and dry cough at night. He was treated with a course of prednisone , which he felt did help his symptoms quite a bit. However, off the steroid, his symptoms recurred, though not as severe. I saw him on 5/23 and added an albuterol  inhaler to his regimen. On follow-up on 6/3, he noted the cough persisted. It had reached the point where he could not lie down in bed, as his cough worsened at that point. His chest exam showed rhonchi in the right lower lung fields, c/w a right lower lobe pneumonia. I treated him with a course of Augmentin  and doxycycline . [name] notes he has had significant improvement in his symptoms. He has only a small amount of lingering cough at this point.  Past Medical History: Patient Active Problem List   Diagnosis Date Noted   Pneumonia of right lower lobe due to infectious organism 01/31/2024   Allergic rhinitis 05/16/2023   Dermatitis- right elbow 05/16/2023   Peripheral polyneuropathy 09/22/2022   Tremor of right hand 09/22/2022   History of pyelonephritis 07/15/2022   Anxiety and depression 07/15/2022   Dyspnea on exertion 07/15/2022   Sepsis (HCC) 07/01/2022   Spinal stenosis at L4-L5 level 05/13/2022   Nephrolithiasis 02/27/2021   Aortic atherosclerosis (HCC) 02/27/2021   Abscess of liver 02/25/2021   Prediabetes 11/18/2020   History of total knee replacement, 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/2020   Essential hypertension 01/11/2020    Onychomycosis 10/12/2019   Hypothyroidism 10/12/2019   Hyperlipidemia 10/12/2019   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   Gout 05/03/2019   Personal history of prostate cancer 05/03/2019   Obesity (BMI 30.0-34.9) 04/26/2019   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   Past Surgical History:  Procedure Laterality Date   EYE SURGERY Bilateral 2014   HERNIA REPAIR Right 1984   inguinal   IR RADIOLOGIST EVAL & MGMT  03/19/2021   KNEE ARTHROSCOPY Left 1966   LUMBAR LAMINECTOMY/DECOMPRESSION MICRODISCECTOMY N/A 05/13/2022   Procedure: Microlumbar laminectomy Lumbar three-four , Lumbar four-five;  Surgeon: Orvan Blanch, MD;  Location: [facility] OR;  Service: Orthopedics;  Laterality: N/A;   PENILE PROSTHESIS IMPLANT  2014   TOTAL KNEE ARTHROPLASTY Right 08/11/2020   Procedure: TOTAL KNEE ARTHROPLASTY;  Surgeon: Liliane Rei, MD;  Location: [facility];  Service: Orthopedics;  Laterality: Right;    TRANSURETHRAL RESECTION OF BLADDER TUMOR  2018   Family History  Problem Relation Age of Onset   Cancer Neg Hx    Outpatient Medications Prior to Visit  Medication Sig Dispense Refill   albuterol  (VENTOLIN  HFA) 108 (90 Base) MCG/ACT inhaler Inhale 2 puffs into the lungs at bedtime as needed for nighttime cough/wheezing 6.7 g 0   allopurinol  (ZYLOPRIM ) 100 MG tablet TAKE 1 TABLET(100 MG) BY MOUTH TWICE DAILY 180 tablet 3   aspirin  EC 81 MG tablet Take 81 mg by mouth daily. Swallow whole.     carvedilol  (COREG ) 25 MG tablet Take 0.5 tablets (12.5 mg total)  by mouth 2 (two) times daily with a meal. TAKE 1 TABLET(25 MG) BY MOUTH TWICE DAILY 90 tablet 3   cholecalciferol (VITAMIN D3) 25 MCG (1000 UNIT) tablet Take 1,000 Units by mouth daily.     citalopram  (CELEXA ) 20 MG tablet TAKE 1 TABLET(20 MG) BY MOUTH DAILY 90 tablet 3   Coenzyme Q10 (COQ10 PO) Take 1 Dose by mouth daily. Liquid form     doxycycline  (VIBRA -TABS) 100 MG tablet Take 1 tablet (100 mg total) by mouth 2 (two) times daily.  20 tablet 0   folic acid  (FOLVITE ) 800 MCG tablet Take 800 mcg by mouth daily.     levothyroxine  (SYNTHROID ) 88 MCG tablet TAKE 1 TABLET(88 MCG) BY MOUTH DAILY BEFORE BREAKFAST 90 tablet 1   loratadine  (CLARITIN ) 10 MG tablet Take 1 tablet (10 mg total) by mouth daily. 30 tablet 2   OVER THE COUNTER MEDICATION Take 2 capsules by mouth daily. Brain Supplement     OVER THE COUNTER MEDICATION Tablespoon of olive oil daily     pantoprazole  (PROTONIX ) 40 MG tablet Take 1 tablet (40 mg total) by mouth daily. 30 tablet 3   rosuvastatin  (CRESTOR ) 10 MG tablet TAKE 1 TABLET(10 MG) BY MOUTH DAILY 90 tablet 3   amoxicillin -clavulanate (AUGMENTIN ) 875-125 MG tablet Take 1 tablet by mouth 2 (two) times daily. 20 tablet 0   No facility-administered medications prior to visit.   No Known Allergies   Objective:   Today's Vitals   02/15/24 0822  BP: 130/74  Pulse: (!) 52  Temp: (!) 97 F (36.1 C)  TempSrc: Temporal  SpO2: 97%  Weight: 255 lb (115.7 kg)  Height: 6' (1.829 m)   Body mass index is 34.58 kg/m.   General: Well developed, well nourished. No acute distress. Lungs: Clear to auscultation bilaterally. No wheezing, rales or rhonchi. Psych: Alert and oriented. Normal mood and affect.  Health Maintenance Due  Topic Date Due   DTaP/Tdap/Td (1 - Tdap) Never done   Zoster Vaccines- Shingrix (1 of 2) Never done   Medicare Annual Wellness (AWV)  12/03/2022   Imaging: Chest x-ray (02/06/2024) IMPRESSION: No active cardiopulmonary disease.    Assessment & Plan:   Problem List Items Addressed This Visit       Respiratory   Pneumonia of right lower lobe due to infectious organism - Primary   Clinically, [name] had pneumonia. His lung exam is clear today and his symptoms have improved. I feel the lingering cough will likely resolve over the next 1-2 weeks. If this persists, we would consider referral to pulmonology.      Other Visit Diagnoses       Mild intermittent reactive  airway disease without complication       Resolved. Discussed only using the albuterol  inhaler if he devlopes wheezing or chest tightness.       Return for Follow-up as scheduled.   [name] Lawyer, MD

## 2024-04-08 ENCOUNTER — Emergency Department (HOSPITAL_COMMUNITY)

## 2024-04-08 ENCOUNTER — Inpatient Hospital Stay (HOSPITAL_COMMUNITY)
Admission: EM | Admit: 2024-04-08 | Discharge: 2024-04-14 | DRG: 871 | Disposition: A | Discharge status: Home / Self Care | Attending: Internal Medicine | Admitting: Internal Medicine

## 2024-04-08 ENCOUNTER — Other Ambulatory Visit: Payer: Self-pay

## 2024-04-08 DIAGNOSIS — R001 Bradycardia, unspecified: Secondary | ICD-10-CM

## 2024-04-08 DIAGNOSIS — Z1152 Encounter for screening for COVID-19: Secondary | ICD-10-CM

## 2024-04-08 DIAGNOSIS — N179 Acute kidney failure, unspecified: Secondary | ICD-10-CM | POA: Diagnosis present

## 2024-04-08 DIAGNOSIS — Z8546 Personal history of malignant neoplasm of prostate: Secondary | ICD-10-CM

## 2024-04-08 DIAGNOSIS — E039 Hypothyroidism, unspecified: Secondary | ICD-10-CM | POA: Diagnosis present

## 2024-04-08 DIAGNOSIS — M109 Gout, unspecified: Secondary | ICD-10-CM | POA: Diagnosis present

## 2024-04-08 DIAGNOSIS — E7800 Pure hypercholesterolemia, unspecified: Secondary | ICD-10-CM | POA: Diagnosis present

## 2024-04-08 DIAGNOSIS — E785 Hyperlipidemia, unspecified: Secondary | ICD-10-CM | POA: Diagnosis present

## 2024-04-08 DIAGNOSIS — A419 Sepsis, unspecified organism: Secondary | ICD-10-CM | POA: Diagnosis present

## 2024-04-08 DIAGNOSIS — E872 Acidosis, unspecified: Secondary | ICD-10-CM | POA: Diagnosis present

## 2024-04-08 DIAGNOSIS — Z96651 Presence of right artificial knee joint: Secondary | ICD-10-CM | POA: Diagnosis present

## 2024-04-08 DIAGNOSIS — I2510 Atherosclerotic heart disease of native coronary artery without angina pectoris: Secondary | ICD-10-CM | POA: Diagnosis present

## 2024-04-08 DIAGNOSIS — I10 Essential (primary) hypertension: Secondary | ICD-10-CM | POA: Diagnosis present

## 2024-04-08 DIAGNOSIS — I700 Atherosclerosis of aorta: Secondary | ICD-10-CM | POA: Diagnosis present

## 2024-04-08 DIAGNOSIS — Z7982 Long term (current) use of aspirin: Secondary | ICD-10-CM

## 2024-04-08 DIAGNOSIS — J181 Lobar pneumonia, unspecified organism: Secondary | ICD-10-CM | POA: Diagnosis present

## 2024-04-08 DIAGNOSIS — E876 Hypokalemia: Secondary | ICD-10-CM | POA: Diagnosis present

## 2024-04-08 DIAGNOSIS — K219 Gastro-esophageal reflux disease without esophagitis: Secondary | ICD-10-CM | POA: Diagnosis present

## 2024-04-08 DIAGNOSIS — Z8551 Personal history of malignant neoplasm of bladder: Secondary | ICD-10-CM

## 2024-04-08 DIAGNOSIS — J189 Pneumonia, unspecified organism: Secondary | ICD-10-CM | POA: Diagnosis not present

## 2024-04-08 DIAGNOSIS — E860 Dehydration: Secondary | ICD-10-CM | POA: Diagnosis present

## 2024-04-08 DIAGNOSIS — F32A Depression, unspecified: Secondary | ICD-10-CM | POA: Diagnosis present

## 2024-04-08 DIAGNOSIS — Z79890 Hormone replacement therapy (postmenopausal): Secondary | ICD-10-CM | POA: Diagnosis not present

## 2024-04-08 DIAGNOSIS — I350 Nonrheumatic aortic (valve) stenosis: Secondary | ICD-10-CM | POA: Diagnosis present

## 2024-04-08 DIAGNOSIS — Z79899 Other long term (current) drug therapy: Secondary | ICD-10-CM

## 2024-04-08 DIAGNOSIS — R6520 Severe sepsis without septic shock: Secondary | ICD-10-CM | POA: Diagnosis present

## 2024-04-08 DIAGNOSIS — I160 Hypertensive urgency: Secondary | ICD-10-CM | POA: Diagnosis present

## 2024-04-08 DIAGNOSIS — Z6833 Body mass index (BMI) 33.0-33.9, adult: Secondary | ICD-10-CM

## 2024-04-08 DIAGNOSIS — Z6834 Body mass index (BMI) 34.0-34.9, adult: Secondary | ICD-10-CM

## 2024-04-08 DIAGNOSIS — E871 Hypo-osmolality and hyponatremia: Secondary | ICD-10-CM | POA: Diagnosis present

## 2024-04-08 DIAGNOSIS — R1310 Dysphagia, unspecified: Secondary | ICD-10-CM | POA: Diagnosis present

## 2024-04-08 DIAGNOSIS — R059 Cough, unspecified: Secondary | ICD-10-CM

## 2024-04-08 DIAGNOSIS — R0609 Other forms of dyspnea: Secondary | ICD-10-CM | POA: Diagnosis present

## 2024-04-08 DIAGNOSIS — M479 Spondylosis, unspecified: Secondary | ICD-10-CM | POA: Diagnosis present

## 2024-04-08 DIAGNOSIS — E66811 Obesity, class 1: Secondary | ICD-10-CM | POA: Diagnosis present

## 2024-04-08 DIAGNOSIS — J309 Allergic rhinitis, unspecified: Secondary | ICD-10-CM | POA: Diagnosis present

## 2024-04-08 DIAGNOSIS — R0602 Shortness of breath: Secondary | ICD-10-CM | POA: Diagnosis present

## 2024-04-08 DIAGNOSIS — M1712 Unilateral primary osteoarthritis, left knee: Secondary | ICD-10-CM | POA: Diagnosis present

## 2024-04-08 DIAGNOSIS — M7989 Other specified soft tissue disorders: Secondary | ICD-10-CM | POA: Diagnosis present

## 2024-04-08 LAB — CBC WITH DIFFERENTIAL/PLATELET
Abs Immature Granulocytes: 0.3 K/uL — ABNORMAL HIGH (ref 0.00–0.07)
Basophils Absolute: 0 K/uL (ref 0.0–0.1)
Basophils Relative: 0 %
Eosinophils Absolute: 0 K/uL (ref 0.0–0.5)
Eosinophils Relative: 0 %
HCT: 44.1 % (ref 39.0–52.0)
Hemoglobin: 14.5 g/dL (ref 13.0–17.0)
Lymphocytes Relative: 4 %
Lymphs Abs: 0.7 K/uL (ref 0.7–4.0)
MCH: 29.2 pg (ref 26.0–34.0)
MCHC: 32.9 g/dL (ref 30.0–36.0)
MCV: 88.9 fL (ref 80.0–100.0)
Metamyelocytes Relative: 2 %
Monocytes Absolute: 0.7 K/uL (ref 0.1–1.0)
Monocytes Relative: 4 %
Neutro Abs: 15 K/uL — ABNORMAL HIGH (ref 1.7–7.7)
Neutrophils Relative %: 90 %
Platelets: 155 K/uL (ref 150–400)
RBC: 4.96 MIL/uL (ref 4.22–5.81)
RDW: 15.9 % — ABNORMAL HIGH (ref 11.5–15.5)
WBC: 16.7 K/uL — ABNORMAL HIGH (ref 4.0–10.5)
nRBC: 0 % (ref 0.0–0.2)

## 2024-04-08 LAB — COMPREHENSIVE METABOLIC PANEL WITH GFR
ALT: 25 U/L (ref 0–44)
AST: 27 U/L (ref 15–41)
Albumin: 3.2 g/dL — ABNORMAL LOW (ref 3.5–5.0)
Alkaline Phosphatase: 72 U/L (ref 38–126)
Anion gap: 13 (ref 5–15)
BUN: 17 mg/dL (ref 8–23)
CO2: 18 mmol/L — ABNORMAL LOW (ref 22–32)
Calcium: 9.8 mg/dL (ref 8.9–10.3)
Chloride: 101 mmol/L (ref 98–111)
Creatinine, Ser: 1.46 mg/dL — ABNORMAL HIGH (ref 0.61–1.24)
GFR, Estimated: 49 mL/min — ABNORMAL LOW (ref >60)
Glucose, Bld: 100 mg/dL — ABNORMAL HIGH (ref 70–99)
Potassium: 4.1 mmol/L (ref 3.5–5.1)
Sodium: 132 mmol/L — ABNORMAL LOW (ref 135–145)
Total Bilirubin: 1.2 mg/dL (ref 0.0–1.2)
Total Protein: 7.2 g/dL (ref 6.5–8.1)

## 2024-04-08 LAB — URIC ACID: Uric Acid, Serum: 5.5 mg/dL (ref 3.7–8.6)

## 2024-04-08 LAB — C-REACTIVE PROTEIN: CRP: 34.1 mg/dL — ABNORMAL HIGH (ref <1.0)

## 2024-04-08 LAB — OSMOLALITY, URINE: Osmolality, Ur: 150 mosm/kg — ABNORMAL LOW (ref 300–900)

## 2024-04-08 LAB — I-STAT CG4 LACTIC ACID, ED
Lactic Acid, Venous: 3 mmol/L (ref 0.5–1.9)
Lactic Acid, Venous: 2.3 mmol/L (ref 0.5–1.9)

## 2024-04-08 LAB — SODIUM, URINE, RANDOM: Sodium, Ur: 18 mmol/L

## 2024-04-08 LAB — CREATININE, URINE, RANDOM: Creatinine, Urine: 33 mg/dL

## 2024-04-08 LAB — RESP PANEL BY RT-PCR (RSV, FLU A&B, COVID)  RVPGX2
Influenza A by PCR: NEGATIVE
Influenza B by PCR: NEGATIVE
Resp Syncytial Virus by PCR: NEGATIVE
SARS Coronavirus 2 by RT PCR: NEGATIVE

## 2024-04-08 LAB — PROCALCITONIN: Procalcitonin: 8.3 ng/mL

## 2024-04-08 LAB — OSMOLALITY: Osmolality: 282 mosm/kg (ref 275–295)

## 2024-04-08 LAB — MRSA NEXT GEN BY PCR, NASAL: MRSA by PCR Next Gen: NOT DETECTED

## 2024-04-08 MED ORDER — FOLIC ACID 1 MG PO TABS
1.0000 mg | ORAL_TABLET | Freq: Every day | ORAL | Status: DC
  Administered 2024-04-09 – 2024-04-14 (×9): 1 mg via ORAL
  Filled 2024-04-08 (×7): qty 1

## 2024-04-08 MED ORDER — PANTOPRAZOLE SODIUM 40 MG PO TBEC
40.0000 mg | DELAYED_RELEASE_TABLET | Freq: Every day | ORAL | Status: DC
  Administered 2024-04-09 – 2024-04-14 (×8): 40 mg via ORAL
  Filled 2024-04-08 (×8): qty 1

## 2024-04-08 MED ORDER — ENOXAPARIN SODIUM 40 MG/0.4ML IJ SOSY
40.0000 mg | PREFILLED_SYRINGE | INTRAMUSCULAR | Status: DC
  Administered 2024-04-08 – 2024-04-10 (×5): 40 mg via SUBCUTANEOUS
  Filled 2024-04-08 (×5): qty 0.4

## 2024-04-08 MED ORDER — SODIUM CHLORIDE 0.9 % IV BOLUS: 500.0000 mL | Freq: Once | INTRAVENOUS | Status: DC

## 2024-04-08 MED ORDER — SODIUM CHLORIDE 0.9 % IV SOLN
500.0000 mg | Freq: Once | INTRAVENOUS | Status: AC
  Administered 2024-04-08: 500 mg via INTRAVENOUS
  Filled 2024-04-08: qty 5

## 2024-04-08 MED ORDER — LACTATED RINGERS IV SOLN: INTRAVENOUS | Status: AC

## 2024-04-08 MED ORDER — LEVALBUTEROL HCL 0.63 MG/3ML IN NEBU: 0.6300 mg | INHALATION_SOLUTION | Freq: Four times a day (QID) | RESPIRATORY_TRACT | Status: DC | PRN

## 2024-04-08 MED ORDER — ACETAMINOPHEN 325 MG PO TABS
650.0000 mg | ORAL_TABLET | Freq: Once | ORAL | Status: AC
  Administered 2024-04-08: 650 mg via ORAL
  Filled 2024-04-08: qty 2

## 2024-04-08 MED ORDER — SODIUM CHLORIDE 0.9 % IV SOLN
3.0000 g | Freq: Four times a day (QID) | INTRAVENOUS | Status: DC
  Administered 2024-04-08 – 2024-04-09 (×5): 3 g via INTRAVENOUS
  Filled 2024-04-08 (×3): qty 8

## 2024-04-08 MED ORDER — SODIUM CHLORIDE 0.9 % IV SOLN
1.0000 g | Freq: Once | INTRAVENOUS | Status: AC
  Administered 2024-04-08: 1 g via INTRAVENOUS
  Filled 2024-04-08: qty 10

## 2024-04-08 MED ORDER — SODIUM CHLORIDE 0.9 % IV BOLUS
1000.0000 mL | Freq: Once | INTRAVENOUS | Status: AC
  Administered 2024-04-08: 1000 mL via INTRAVENOUS

## 2024-04-08 MED ORDER — LEVOTHYROXINE SODIUM 88 MCG PO TABS
88.0000 ug | ORAL_TABLET | Freq: Every day | ORAL | Status: DC
  Administered 2024-04-09 – 2024-04-13 (×8): 88 ug via ORAL
  Filled 2024-04-08 (×7): qty 1

## 2024-04-08 MED ORDER — CITALOPRAM HYDROBROMIDE 20 MG PO TABS
20.0000 mg | ORAL_TABLET | Freq: Every day | ORAL | Status: DC
  Administered 2024-04-09 – 2024-04-14 (×9): 20 mg via ORAL
  Filled 2024-04-08 (×7): qty 1

## 2024-04-08 MED ORDER — ACETAMINOPHEN 325 MG PO TABS: 650.0000 mg | ORAL_TABLET | Freq: Four times a day (QID) | ORAL | Status: DC | PRN

## 2024-04-08 MED ORDER — METOPROLOL TARTRATE 5 MG/5ML IV SOLN: 5.0000 mg | Freq: Three times a day (TID) | INTRAVENOUS | Status: DC | PRN

## 2024-04-08 MED ORDER — DEXTROSE 5 % IV SOLN
500.0000 mg | INTRAVENOUS | Status: DC
  Filled 2024-04-08: qty 5

## 2024-04-08 MED ORDER — CARVEDILOL 3.125 MG PO TABS
3.1250 mg | ORAL_TABLET | Freq: Two times a day (BID) | ORAL | Status: DC
  Administered 2024-04-09 (×2): 3.125 mg via ORAL
  Filled 2024-04-08: qty 1

## 2024-04-08 MED ORDER — ASPIRIN 81 MG PO TBEC
81.0000 mg | DELAYED_RELEASE_TABLET | Freq: Every day | ORAL | Status: DC
Start: 2024-04-09 — End: 2024-04-14
  Administered 2024-04-09 – 2024-04-14 (×9): 81 mg via ORAL
  Filled 2024-04-08 (×7): qty 1

## 2024-04-08 MED ORDER — SENNOSIDES-DOCUSATE SODIUM 8.6-50 MG PO TABS: 1.0000 | ORAL_TABLET | Freq: Every evening | ORAL | Status: DC | PRN

## 2024-04-08 MED ORDER — ACETAMINOPHEN 650 MG RE SUPP
650.0000 mg | Freq: Four times a day (QID) | RECTAL | Status: DC | PRN
Start: 2024-04-08 — End: 2024-04-11

## 2024-04-08 MED ORDER — LACTATED RINGERS IV SOLN: INTRAVENOUS | Status: DC

## 2024-04-08 MED ORDER — ALLOPURINOL 100 MG PO TABS
100.0000 mg | ORAL_TABLET | Freq: Every day | ORAL | Status: DC
  Administered 2024-04-09 – 2024-04-11 (×6): 100 mg via ORAL
  Filled 2024-04-08 (×4): qty 1

## 2024-04-08 MED ORDER — ALBUTEROL SULFATE (2.5 MG/3ML) 0.083% IN NEBU: 2.5000 mg | INHALATION_SOLUTION | Freq: Four times a day (QID) | RESPIRATORY_TRACT | Status: DC | PRN

## 2024-04-08 MED ORDER — ROSUVASTATIN CALCIUM 5 MG PO TABS
10.0000 mg | ORAL_TABLET | Freq: Every day | ORAL | Status: DC
  Administered 2024-04-09 – 2024-04-14 (×9): 10 mg via ORAL
  Filled 2024-04-08 (×7): qty 2

## 2024-04-08 MED ORDER — PROMETHAZINE HCL 12.5 MG PO TABS
12.5000 mg | ORAL_TABLET | Freq: Four times a day (QID) | ORAL | Status: DC | PRN
  Filled 2024-04-08 (×8): qty 1

## 2024-04-08 NOTE — ED Notes (Signed)
 Phlebotomy at bedside collecting two sets of blood cultures.

## 2024-04-08 NOTE — ED Notes (Signed)
 CCMD called, pt on monitor

## 2024-04-08 NOTE — ED Triage Notes (Signed)
 Pt c.o sob, sore throat and cough x 1 week, negative home covid test.

## 2024-04-08 NOTE — H&P (Signed)
 [facility] H&P   Patient Demographics:    [name], is a 78 y.o. male  MRN: [id number]   DOB - 03-06-1946  Admit Date - 04/08/2024  Outpatient Primary MD for the patient is [name], [name], MD   Patient coming from: Home  Chief Complaint  Patient presents with   Shortness of Breath   Cough      HPI:    [name]  is a 78 y.o. male, with history of hypertension, high cholesterol, hypothyroidism, prostate cancer, UTI with sepsis few years ago, gout, arthritis, GERD who lives with his wife and son at his home, had been diagnosed with pneumonia by his PCP about a month ago, this problem resolved after antibiotic course.  He now presents with 4 to 5-day history of cough, gradually progressive shortness of breath with exertion and fatigue which started yesterday, presented to the ER where he was diagnosed with early sepsis due to community-acquired pneumonia in the left lower lobe and I was called to admit the patient  Currently patient is feeling a whole lot better after he received some fluids and antibiotics, he never had any fever or chills, no headache, currently no chest pain or palpitations, he does have a dry cough, exertional shortness of breath, held abdominal pain due to constant cough, no diarrhea or dysuria, no blood in stool or urine, no focal weakness, does have some generalized weakness, he chronically has right lower extremity swelling which is present, denies any exposure to sick contacts, no recent travel, no other subjective complaints.    Review of systems:    A full 10 point Review of Systems was done, except as stated above, all other Review of Systems were negative.   With Past History of the  following :    Past Medical History:  Diagnosis Date   Arthritis    knees. back   Bladder cancer (HCC)    2018   High cholesterol    Hypertension    Hypothyroidism    prostate cancer    2000, 2003   Thyroid  disease       Past Surgical History:  Procedure Laterality Date   EYE SURGERY Bilateral 2014   HERNIA REPAIR Right 1984   inguinal   IR RADIOLOGIST EVAL & MGMT  03/19/2021   KNEE ARTHROSCOPY Left 1966  LUMBAR LAMINECTOMY/DECOMPRESSION MICRODISCECTOMY N/A 05/13/2022   Procedure: Microlumbar laminectomy Lumbar three-four , Lumbar four-five;  Surgeon: Duwayne Purchase, MD;  Location: [facility] OR;  Service: Orthopedics;  Laterality: N/A;   PENILE PROSTHESIS IMPLANT  2014   TOTAL KNEE ARTHROPLASTY Right 08/11/2020   Procedure: TOTAL KNEE ARTHROPLASTY;  Surgeon: Melodi Lerner, MD;  Location: [facility];  Service: Orthopedics;  Laterality: Right;    TRANSURETHRAL RESECTION OF BLADDER TUMOR  2018      Social History:     Social History   Tobacco Use   Smoking status: Never    Passive exposure: Never   Smokeless tobacco: Never  Substance Use Topics   Alcohol use: Yes    Alcohol/week: 2.0 standard drinks of alcohol    Types: 2 Standard drinks or equivalent per week    Comment: rarely         Family History :     Family History  Problem Relation Age of Onset   Cancer Neg Hx        Home Medications:   Prior to Admission medications   Medication Sig Start Date End Date Taking? Authorizing Provider  albuterol  (VENTOLIN  HFA) 108 (90 Base) MCG/ACT inhaler Inhale 2 puffs into the lungs at bedtime as needed for nighttime cough/wheezing 01/20/24   Thedora [name], MD  allopurinol  (ZYLOPRIM ) 100 MG tablet TAKE 1 TABLET(100 MG) BY MOUTH TWICE DAILY Patient taking differently: Take 100 mg by mouth 2 (two) times daily. 07/30/23   Douglass Caul B, FNP  aspirin  EC 81 MG tablet Take 81 mg by mouth daily. Swallow whole.    [provider]  carvedilol  (COREG ) 25 MG tablet  Take 0.5 tablets (12.5 mg total) by mouth 2 (two) times daily with a meal. TAKE 1 TABLET(25 MG) BY MOUTH TWICE DAILY 08/03/23   Thedora [name], MD  cholecalciferol (VITAMIN D3) 25 MCG (1000 UNIT) tablet Take 1,000 Units by mouth daily.    [provider]  citalopram  (CELEXA ) 20 MG tablet TAKE 1 TABLET(20 MG) BY MOUTH DAILY Patient taking differently: Take 20 mg by mouth daily. 11/07/23   Thedora [name], MD  Coenzyme Q10 (COQ10 PO) Take 1 Dose by mouth daily. Liquid form    [provider]  doxycycline  (VIBRA -TABS) 100 MG tablet Take 1 tablet (100 mg total) by mouth 2 (two) times daily. 01/31/24   Thedora [name], MD  folic acid  (FOLVITE ) 800 MCG tablet Take 800 mcg by mouth daily.    [provider]  levothyroxine  (SYNTHROID ) 88 MCG tablet TAKE 1 TABLET(88 MCG) BY MOUTH DAILY BEFORE BREAKFAST Patient taking differently: Take 88 mcg by mouth daily before breakfast. 01/24/24   Thedora [name], MD  loratadine  (CLARITIN ) 10 MG tablet Take 1 tablet (10 mg total) by mouth daily. 07/14/22   McElwee, Tinnie LABOR, NP  OVER THE COUNTER MEDICATION Take 2 capsules by mouth daily. Brain Supplement    [provider]  OVER THE COUNTER MEDICATION Tablespoon of olive oil daily    [provider]  pantoprazole  (PROTONIX ) 40 MG tablet Take 1 tablet (40 mg total) by mouth daily. 09/30/23   Thedora [name], MD  rosuvastatin  (CRESTOR ) 10 MG tablet TAKE 1 TABLET(10 MG) BY MOUTH DAILY Patient taking differently: Take 10 mg by mouth daily. 10/27/23   Thedora [name], MD     Allergies:    No Known Allergies   Physical Exam:   Vitals  Blood pressure (!) 146/77, pulse (!) 112, temperature 99.6 F (37.6  C), resp. rate (!) 22, height 6' (1.829 m), weight 111.1 kg, SpO2 93%.   1. General - elderly white male, in bed, non toxic, in no distress  2. Normal affect and insight, Not Suicidal or Homicidal, Awake Alert,   3. No F.N deficits, ALL C.Nerves Intact, Strength 5/5 all 4  extremities, Sensation intact all 4 extremities, Plantars down going.  4. Ears and Eyes appear Normal, Conjunctivae clear, PERRLA. Moist Oral Mucosa.  5. Supple Neck, No JVD, No cervical lymphadenopathy appriciated, No Carotid Bruits.  6. Symmetrical Chest wall movement, Good air movement bilaterally, LLL rales  7. RRR, No Gallops, Rubs or Murmurs, No Parasternal Heave.  8. Positive Bowel Sounds, Abdomen Soft, No tenderness, No organomegaly appriciated,No rebound -guarding or rigidity.  9.  No Cyanosis, Normal Skin Turgor, No Skin Rash or Bruise.  10. Good muscle tone,  joints appear normal , no effusions, Normal ROM.  11. No Palpable Lymph Nodes in Neck or Axillae      Data Review:   Recent Labs  Lab 04/08/24 1531  WBC 16.7*  HGB 14.5  HCT 44.1  PLT 155  MCV 88.9  MCH 29.2  MCHC 32.9  RDW 15.9*  LYMPHSABS 0.7  MONOABS 0.7  EOSABS 0.0  BASOSABS 0.0    Recent Labs  Lab 04/08/24 1531 04/08/24 1545 04/08/24 1748  NA 132*  --   --   K 4.1  --   --   CL 101  --   --   CO2 18*  --   --   ANIONGAP 13  --   --   GLUCOSE 100*  --   --   BUN 17  --   --   CREATININE 1.46*  --   --   AST 27  --   --   ALT 25  --   --   ALKPHOS 72  --   --   BILITOT 1.2  --   --   ALBUMIN 3.2*  --   --   LATICACIDVEN  --  3.0* 2.3*  CALCIUM  9.8  --   --     Lab Results  Component Value Date   CHOL 161 02/11/2023   HDL 39.40 02/11/2023   LDLCALC 84 02/11/2023   LDLDIRECT 81 01/11/2020   TRIG 189.0 (H) 02/11/2023   CHOLHDL 4 02/11/2023    Recent Labs  Lab 04/08/24 1531 04/08/24 1545 04/08/24 1748  LATICACIDVEN  --  3.0* 2.3*  CALCIUM  9.8  --   --      Imaging Results:    DG Chest 2 View Result Date: 04/08/2024 CLINICAL DATA:  10031 Cough 10031 EXAM: CHEST - 2 VIEW COMPARISON:  February 06, 2024 FINDINGS: Lower lung volumes. Hazy airspace opacities in the left lower lobe. No pleural effusion or pneumothorax. No cardiomegaly. No acute fracture or destructive lesion.  Multilevel degenerative disc disease of the spine. IMPRESSION: Left lower lobe pneumonia. A 2 view chest radiograph in 6-12 weeks is recommended to document resolution once treatment is complete. Electronically Signed   By: Rogelia Myers M.D.   On: 04/08/2024 16:49    My personal review of EKG: Rhythm S.tach , no Acute ST changes   Assessment & Plan:    1. CAP, left lower lobe- early Sepsis with acute hypoxic respiratory failure - sepsis protocol initiated by the ER MD, patient's lactic acid is already trending down, blood pressure is in fact slightly on the higher side, he does not appear toxic, he feels  slightly tired but other than that he is already feeling a whole lot better, blood cultures have been drawn, will admit him to progressive care, place him on empiric antibiotics, add I-S and flutter valve for pulmonary toiletry, speech eval to rule out ongoing aspiration as he was diagnosed with pneumonia 1 month ago as well, 2 L nasal cannula oxygen for hypoxia.  Follow cultures and trend inflammatory markers.  Probably would be prudent to get a chest CT in 3 to 4 weeks after her pneumonia has resolved to make sure he does not have any other pathology as this is second pneumonia in a month.  Will defer this to PCP.  2. AKI with mild hyponatremia.  Due to #1 above, hydrate and monitor avoid nephrotoxins, check serum and urine osmolality, urine electrolytes.  3. HTN.  Blood pressure is stable, being hydrated for #1 above, resume home Coreg  from tomorrow morning at a lower than home dose, as needed IV Lopressor  for now.  4. Gout.  Continue home allopurinol .  5.Hypothyroidism.  Continue home Synthroid   6. GERD.  On PPI continue  7.  History of prostate cancer.  Outpatient follow-up with PCP.  8.  Chronic right lower extremity swelling.  He wears compression stockings at home.  Add them.   DVT Prophylaxis  Lovenox    AM Labs Ordered, also please review Full Orders  Family Communication:  Admission, patients condition and plan of care including tests being ordered have been discussed with the patient  who indicates understanding and agree with the plan and Code Status.  Code Status Full  Likely DC to  Home  Condition GUARDED    Consults called: None    Admission status: inpt    Time spent in minutes : 35  Signature  -    Lavada Stank M.D on 04/08/2024 at 6:03 PM   -  To page go to [email or web address]

## 2024-04-08 NOTE — ED Provider Notes (Signed)
 [place] EMERGENCY DEPARTMENT AT [facility] Provider Note   CSN: [id number] Arrival date & time: 04/08/24  1515     Patient presents with: Shortness of Breath and Cough   [name] is a 78 y.o. male history of hypertension, here presenting with cough and fever and shortness of breath.  Patient states that he has been having sore throat and cough for about a week or so.  He also has some shortness of breath and subjective fevers.  Patient was diagnosed with pneumonia 2 months ago and finished a course of Augmentin .  Denies any sick contacts.   The history is provided by the patient.       Prior to Admission medications   Medication Sig Start Date End Date Taking? Authorizing Provider  albuterol  (VENTOLIN  HFA) 108 (90 Base) MCG/ACT inhaler Inhale 2 puffs into the lungs at bedtime as needed for nighttime cough/wheezing 01/20/24   Thedora [name], MD  allopurinol  (ZYLOPRIM ) 100 MG tablet TAKE 1 TABLET(100 MG) BY MOUTH TWICE DAILY 07/30/23   Webb, Padonda B, FNP  aspirin  EC 81 MG tablet Take 81 mg by mouth daily. Swallow whole.    [provider]  carvedilol  (COREG ) 25 MG tablet Take 0.5 tablets (12.5 mg total) by mouth 2 (two) times daily with a meal. TAKE 1 TABLET(25 MG) BY MOUTH TWICE DAILY 08/03/23   Thedora [name], MD  cholecalciferol (VITAMIN D3) 25 MCG (1000 UNIT) tablet Take 1,000 Units by mouth daily.    [provider]  citalopram  (CELEXA ) 20 MG tablet TAKE 1 TABLET(20 MG) BY MOUTH DAILY 11/07/23   Thedora [name], MD  Coenzyme Q10 (COQ10 PO) Take 1 Dose by mouth daily. Liquid form    [provider]  doxycycline  (VIBRA -TABS) 100 MG tablet Take 1 tablet (100 mg total) by mouth 2 (two) times daily. 01/31/24   Thedora [name], MD  folic acid  (FOLVITE ) 800 MCG tablet Take 800 mcg by mouth daily.    [provider]  levothyroxine  (SYNTHROID ) 88 MCG tablet TAKE 1 TABLET(88 MCG) BY MOUTH DAILY BEFORE BREAKFAST 01/24/24   Thedora [name], MD   loratadine  (CLARITIN ) 10 MG tablet Take 1 tablet (10 mg total) by mouth daily. 07/14/22   McElwee, Tinnie LABOR, NP  OVER THE COUNTER MEDICATION Take 2 capsules by mouth daily. Brain Supplement    [provider]  OVER THE COUNTER MEDICATION Tablespoon of olive oil daily    [provider]  pantoprazole  (PROTONIX ) 40 MG tablet Take 1 tablet (40 mg total) by mouth daily. 09/30/23   Thedora [name], MD  rosuvastatin  (CRESTOR ) 10 MG tablet TAKE 1 TABLET(10 MG) BY MOUTH DAILY 10/27/23   Thedora [name], MD    Allergies: Patient has no known allergies.    Review of Systems  Respiratory:  Positive for cough and shortness of breath.   All other systems reviewed and are negative.   Updated Vital Signs BP (!) 146/77 (BP Location: Left Arm)   Pulse (!) 112   Temp 99.6 F (37.6 C)   Resp (!) 22   Ht 6' (1.829 m)   Wt 111.1 kg   SpO2 93%   BMI 33.23 kg/m   Physical Exam Vitals and nursing note reviewed.  Constitutional:      Appearance: He is well-developed.  HENT:     Head: Normocephalic.     Mouth/Throat:     Mouth: Mucous membranes are moist.  Eyes:     Extraocular Movements: Extraocular  movements intact.     Pupils: Pupils are equal, round, and reactive to light.  Cardiovascular:     Rate and Rhythm: Normal rate and regular rhythm.  Pulmonary:     Comments: Crackles L base  Abdominal:     Palpations: Abdomen is soft.  Musculoskeletal:        General: Normal range of motion.     Cervical back: Normal range of motion and neck supple.  Skin:    General: Skin is warm.     Capillary Refill: Capillary refill takes less than 2 seconds.  Neurological:     General: No focal deficit present.     Mental Status: He is alert and oriented to person, place, and time.  Psychiatric:        Mood and Affect: Mood normal.        Behavior: Behavior normal.     (all labs ordered are listed, but only abnormal results are displayed) Labs Reviewed  COMPREHENSIVE METABOLIC  PANEL WITH GFR - Abnormal; Notable for the following components:      Result Value   Sodium 132 (*)    CO2 18 (*)    Glucose, Bld 100 (*)    Creatinine, Ser 1.46 (*)    Albumin 3.2 (*)    GFR, Estimated 49 (*)    All other components within normal limits  CBC WITH DIFFERENTIAL/PLATELET - Abnormal; Notable for the following components:   WBC 16.7 (*)    RDW 15.9 (*)    Neutro Abs 15.0 (*)    Abs Immature Granulocytes 0.30 (*)    All other components within normal limits  I-STAT CG4 LACTIC ACID, ED - Abnormal; Notable for the following components:   Lactic Acid, Venous 3.0 (*)    All other components within normal limits  RESP PANEL BY RT-PCR (RSV, FLU A&B, COVID)  [id number]  I-STAT CG4 LACTIC ACID, ED    EKG: EKG Interpretation Date/Time:  Sunday April 08 2024 15:47:15 EDT Ventricular Rate:  112 PR Interval:  180 QRS Duration:  76 QT Interval:  298 QTC Calculation: 406 R Axis:   52  Text Interpretation: Sinus tachycardia Otherwise normal ECG When compared with ECG of 25-Jul-2023 03:20, PREVIOUS ECG IS PRESENT Confirmed by Patt Alm DEL [phone]) on 04/08/2024 4:12:09 PM  Radiology: [facility] Chest 2 View Result Date: 04/08/2024 CLINICAL DATA:  10031 Cough 10031 EXAM: CHEST - 2 VIEW COMPARISON:  February 06, 2024 FINDINGS: Lower lung volumes. Hazy airspace opacities in the left lower lobe. No pleural effusion or pneumothorax. No cardiomegaly. No acute fracture or destructive lesion. Multilevel degenerative disc disease of the spine. IMPRESSION: Left lower lobe pneumonia. A 2 view chest radiograph in 6-12 weeks is recommended to document resolution once treatment is complete. Electronically Signed   By: Rogelia Myers M.D.   On: 04/08/2024 16:49     Procedures   Medications Ordered in the ED  cefTRIAXone  (ROCEPHIN ) 1 g in sodium chloride  0.9 % 100 mL IVPB (1 g Intravenous New Bag/Given 04/08/24 1645)  azithromycin  (ZITHROMAX ) 500 mg in sodium chloride  0.9 % 250 mL IVPB (has no administration  in time range)  sodium chloride  0.9 % bolus 1,000 mL (1,000 mLs Intravenous New Bag/Given 04/08/24 1644)                                    Medical Decision Making [name] is a 78 y.o. male here presenting  with a cough and fever.  Patient is tachycardic on arrival.  Sepsis workup initiated in triage.  Concern for possible pneumonia.  Will get CBC and CMP and lactate and cultures and chest x-ray.  5:15 PM Chest x-ray showed left lower lobe pneumonia.  White blood cell count elevated at 16.  Lactate is 3.  Patient was given IV antibiotics.  Hospitalist to admit    Problems Addressed: Community acquired pneumonia of left lower lobe of lung: acute illness or injury  Amount and/or Complexity of Data Reviewed Labs: ordered. Radiology: ordered.    Final diagnoses:  None    ED Discharge Orders     None          Patt Alm Macho, MD 04/08/24 1715

## 2024-04-08 NOTE — ED Notes (Signed)
 Pt ambulated to bathroom with no assistance.

## 2024-04-08 NOTE — ED Notes (Signed)
 Awaiting patient from lobby.

## 2024-04-09 ENCOUNTER — Inpatient Hospital Stay (HOSPITAL_COMMUNITY)

## 2024-04-09 DIAGNOSIS — E785 Hyperlipidemia, unspecified: Secondary | ICD-10-CM

## 2024-04-09 DIAGNOSIS — E039 Hypothyroidism, unspecified: Secondary | ICD-10-CM

## 2024-04-09 DIAGNOSIS — N179 Acute kidney failure, unspecified: Secondary | ICD-10-CM

## 2024-04-09 DIAGNOSIS — J189 Pneumonia, unspecified organism: Secondary | ICD-10-CM | POA: Diagnosis not present

## 2024-04-09 DIAGNOSIS — M109 Gout, unspecified: Secondary | ICD-10-CM

## 2024-04-09 DIAGNOSIS — Z8546 Personal history of malignant neoplasm of prostate: Secondary | ICD-10-CM

## 2024-04-09 DIAGNOSIS — I10 Essential (primary) hypertension: Secondary | ICD-10-CM | POA: Diagnosis not present

## 2024-04-09 LAB — BASIC METABOLIC PANEL WITH GFR
Anion gap: 10 (ref 5–15)
BUN: 16 mg/dL (ref 8–23)
CO2: 21 mmol/L — ABNORMAL LOW (ref 22–32)
Calcium: 8.9 mg/dL (ref 8.9–10.3)
Chloride: 102 mmol/L (ref 98–111)
Creatinine, Ser: 1.34 mg/dL — ABNORMAL HIGH (ref 0.61–1.24)
GFR, Estimated: 54 mL/min — ABNORMAL LOW (ref >60)
Glucose, Bld: 87 mg/dL (ref 70–99)
Potassium: 3.8 mmol/L (ref 3.5–5.1)
Sodium: 133 mmol/L — ABNORMAL LOW (ref 135–145)

## 2024-04-09 LAB — CBC WITH DIFFERENTIAL/PLATELET
Basophils Absolute: 0 K/uL (ref 0.0–0.1)
Basophils Relative: 0 %
Eosinophils Absolute: 0 K/uL (ref 0.0–0.5)
Eosinophils Relative: 0 %
HCT: 36.9 % — ABNORMAL LOW (ref 39.0–52.0)
Hemoglobin: 12.4 g/dL — ABNORMAL LOW (ref 13.0–17.0)
Lymphocytes Relative: 6 %
Lymphs Abs: 0.8 K/uL (ref 0.7–4.0)
MCH: 29.5 pg (ref 26.0–34.0)
MCHC: 33.6 g/dL (ref 30.0–36.0)
MCV: 87.9 fL (ref 80.0–100.0)
Monocytes Absolute: 0.4 K/uL (ref 0.1–1.0)
Monocytes Relative: 3 %
Neutro Abs: 12 K/uL — ABNORMAL HIGH (ref 1.7–7.7)
Neutrophils Relative %: 91 %
Platelets: 138 K/uL — ABNORMAL LOW (ref 150–400)
RBC: 4.2 MIL/uL — ABNORMAL LOW (ref 4.22–5.81)
RDW: 15.9 % — ABNORMAL HIGH (ref 11.5–15.5)
WBC: 13.2 K/uL — ABNORMAL HIGH (ref 4.0–10.5)
nRBC: 0 % (ref 0.0–0.2)

## 2024-04-09 LAB — LACTIC ACID, PLASMA
Lactic Acid, Venous: 2 mmol/L (ref 0.5–1.9)
Lactic Acid, Venous: 1.5 mmol/L (ref 0.5–1.9)
Lactic Acid, Venous: 1.4 mmol/L (ref 0.5–1.9)

## 2024-04-09 LAB — MAGNESIUM: Magnesium: 1.7 mg/dL (ref 1.7–2.4)

## 2024-04-09 MED ORDER — SODIUM CHLORIDE 0.9 % IV SOLN
1.0000 g | INTRAVENOUS | Status: AC
  Administered 2024-04-09 – 2024-04-13 (×8): 1 g via INTRAVENOUS
  Filled 2024-04-09 (×5): qty 10

## 2024-04-09 MED ORDER — ALBUTEROL SULFATE (2.5 MG/3ML) 0.083% IN NEBU
2.5000 mg | INHALATION_SOLUTION | RESPIRATORY_TRACT | Status: DC | PRN
  Administered 2024-04-09 – 2024-04-12 (×3): 2.5 mg via RESPIRATORY_TRACT
  Filled 2024-04-09 (×14): qty 3

## 2024-04-09 MED ORDER — IPRATROPIUM-ALBUTEROL 0.5-2.5 (3) MG/3ML IN SOLN
3.0000 mL | Freq: Four times a day (QID) | RESPIRATORY_TRACT | Status: DC
  Administered 2024-04-09 (×2): 3 mL via RESPIRATORY_TRACT
  Filled 2024-04-09: qty 3

## 2024-04-09 MED ORDER — MAGNESIUM SULFATE 2 GM/50ML IV SOLN
2.0000 g | Freq: Once | INTRAVENOUS | Status: AC
  Administered 2024-04-09 (×2): 2 g via INTRAVENOUS
  Filled 2024-04-09: qty 50

## 2024-04-09 MED ORDER — SODIUM CHLORIDE 0.9 % IV SOLN: INTRAVENOUS | Status: DC

## 2024-04-09 MED ORDER — AZITHROMYCIN 500 MG PO TABS
500.0000 mg | ORAL_TABLET | Freq: Every day | ORAL | Status: AC
  Administered 2024-04-09 – 2024-04-13 (×8): 500 mg via ORAL
  Filled 2024-04-09: qty 2
  Filled 2024-04-09 (×2): qty 1
  Filled 2024-04-09 (×2): qty 2

## 2024-04-09 NOTE — Assessment & Plan Note (Signed)
 Follow up as outpatient

## 2024-04-09 NOTE — Plan of Care (Signed)
   Problem: Clinical Measurements: Goal: Diagnostic test results will improve Outcome: Progressing Goal: Respiratory complications will improve Outcome: Progressing

## 2024-04-09 NOTE — Progress Notes (Addendum)
 Progress Note   Patient: [name] FMW:[id number] DOB: Sep 19, 1945 DOA: 04/08/2024     1 DOS: the patient was seen and examined on 04/09/2024   Brief hospital course: [name] was admitted to the hospital with the working diagnosis of community acquired pneumonia.   78 yo male with the past medical history of hypertension, dyslipidemia, and prostate cancer, who presented with dyspnea and cough.  Apparently recently diagnosed with pneumonia and treated with antibiotic therapy.  Reported 4 to 5 days of worsening cough, dyspnea on exertion and fatigue.  On his initial physical examination his blood pressure was 1467/77, HR 112, RR 22 and 02 saturation 93%lungs with no wheezing or rhonchi, positive rales left lower lobe, heart with S1 and S2 present and regular with no gallops, rubs or murmurs, abdomen with no distention and no lower extremity edema.   Na 132, K 4,1 Cl 101 bicarbonate 18, glucose 1000 bun 17 cr 1,46  AST 27 ALT 25  Lactic acid 3,0, 2.3, 2.0  Wbc 16.7 hgb 14.5 plt 155  Sars covid 19 negative Influenza negative RSV negative   Chest radiograph with mild cardiomegaly, no vascular congestion, faint interstitial retrocardiac infiltrate.   EKG 112 bpm, normal axis, normal intervals, qtc 406, sinus rhythm with no significant ST segment or T wave changes.   Assessment and Plan: * CAP (community acquired pneumonia) Severe sepsis with lactic acidosis, AKI.   Lactic acid continue elevated at 2,0 Wbc 13.2  Cultures with no growth, T max 99.9 (37,8)  RR 20's  Plan to continue IV antibiotic therapy with Ceftriaxone , oral azithromycin .  Isotonic IV fluids at 75 ml per hr.  Add bronchodilator therapy, incentive spirometer and flutter valve.  Recurrent pneumonia, will do a non contrast CT chest to further evaluate lung parenchyma.  Continue oxymetry monitoring and supplemental 02 per [place] as needed to keep 02 saturation greater than 92%  Follow up lactic acid, cell count and  cultures. Legionella and streptococcal  urinary antigens.  Patient with significant ectopy on telemetry, PAC and PVC, with non sustained VT. Will check echocardiogram.  Keep electrolytes K at 4 and Mg at 2  Essential hypertension Discontinue carvedilol  for now.  Check echocardiogram.  Continue blood pressure monitoring   AKI (acute kidney injury) (HCC) Hyponatremia.   Renal function with serum cr at 1.34 with K at 3,8 and serum bicarbonate at 21 Na 133   Continue isotonic saline and follow up renal function and electrolytes Avoid hypotension or nephrotoxic medications.   Gout No signs of acute flare, will continue with allopurinol .  Hypothyroidism Continue levothyroxine    Personal history of prostate cancer Follow up as outpatient  Hyperlipidemia Continue with rosuvastatin       Subjective: today not yet back to baseline, continue to have dyspnea, wheezing and still feeling poorly with only mild improvement, no nausea or vomiting, has chronic right lower extremity edema   Physical Exam: Vitals:   04/08/24 2225 04/09/24 0114 04/09/24 0529 04/09/24 0755  BP: (!) 181/85 134/75 (!) 144/69 124/75  Pulse: (!) 103 94 (!) 50 (!) 42  Resp: 18 18 18 18   Temp: 98.9 F (37.2 C) 99.9 F (37.7 C) 98.8 F (37.1 C) 97.7 F (36.5 C)  TempSrc: Oral Oral Oral Oral  SpO2: 90% 96% 95% 93%  Weight: 115.3 kg  115.3 kg   Height: 6' (1.829 m)      Neurology awake and alert, deconditioned and ill looking appearing ENT with mild pallor Cardiovascular with S1 and S2 present, regular,  tachycardic with positive right lower sternal murmur in systole Respiratory with rales at bases bilaterally, more left than right, scattered rhonchi with no wheezing Abdomen protuberant but non tender, soft and not distended Right lower extremity edema, non pitting   Data Reviewed:    Family Communication: no family at the bedside   Disposition: Status is: Inpatient Remains inpatient appropriate  because: IV fluids and IV antibiotics.   Planned Discharge Destination: Home     Author: Elidia Toribio Furnace, MD 04/09/2024 10:26 AM  For on call review [email or web address].

## 2024-04-09 NOTE — Assessment & Plan Note (Deleted)
 Discontinue carvedilol  for now.  Check echocardiogram.  Continue blood pressure monitoring

## 2024-04-09 NOTE — Assessment & Plan Note (Addendum)
 No signs of acute flare, will continue home allopurinol  100 mg daily

## 2024-04-09 NOTE — Assessment & Plan Note (Addendum)
 Initially meeting severe sepsis criteria on admission, with lactic acidosis, AKI - resolved  Still with endorsing SOB and recurring cough, wheezing No hypoxia  8/11 CT chest with dense consolidation in the left lower lobe keeping with changes of acute lobar pneumonia. Trace of left para-mnemonic effusion. Diffuse airway inflammation. Moderate multivessel coronary artery disease.    Plan: continue IV antibiotic therapy with Ceftriaxone  1g IV daily, azithromycin  500 mg daily PO, to complete 5 day course.  Continue with duoneb q6h scheduled, incentive spirometer and flutter valve.  Continue oral prednisone  40 mg daily with breakfast Supplemental O2 prn  Pending Legionella and streptococcal  urinary antigens - discuss with H@H  team, to collect urine sample and titrate/change abx as appropriate

## 2024-04-09 NOTE — Assessment & Plan Note (Addendum)
 Renal function much improved s/p IVF and po intake (1.46-->1.36-->1.1)  Baseline renal function over the last year: 0.96/1.10 Minimize nephrotoxic agents  Recheck CMP in the AM

## 2024-04-09 NOTE — Evaluation (Signed)
 Physical Therapy Evaluation Patient Details Name: [name] MRN: [id number] DOB: 1946-02-16 Today's Date: 04/09/2024  History of Present Illness  The pt is a 78 yo male presenting 8/10 with cough and SOB, found to have sepsis due to CAP. PMH includes: recent PNA resolved after antibiotics, HTN, hypothyroidism, prostate cancer, UTI, gout, arthritis, GERD, high cholesterol, and R TKA.   Clinical Impression  Pt in bed upon arrival of PT, agreeable to evaluation at this time. Prior to admission the pt was independent with all mobility, working full time. The pt does report he is largely sedentary and discussed how to safely initiate exercise program (recommend silver sneakers and personal trainer). The pt now presents with minor limitations in functional mobility, stability, and endurance, but was able to complete all transfers without need for UE support or assistance. Pt generally safe to return home with family support, but was noted to run into various objects on L side with ambulation of which he did not seem aware. Educated on risk of injury, fall, and other complications, importance of increased scanning (pt was unsure if his vision was impaired), and increased attention to surroundings. Pt and family verbalized understanding. Will continue to follow acutely.     If plan is discharge home, recommend the following: A little help with walking and/or transfers;Help with stairs or ramp for entrance   Can travel by private vehicle        Equipment Recommendations None recommended by PT  Recommendations for Other Services       Functional Status Assessment Patient has had a recent decline in their functional status and demonstrates the ability to make significant improvements in function in a reasonable and predictable amount of time.     Precautions / Restrictions Precautions Precautions: Fall Recall of Precautions/Restrictions: Intact Restrictions Weight Bearing Restrictions Per  Provider Order: No      Mobility  Bed Mobility Overal bed mobility: Modified Independent             General bed mobility comments: increased time and use of rails    Transfers Overall transfer level: Needs assistance Equipment used: None Transfers: Sit to/from Stand Sit to Stand: Supervision           General transfer comment: no assist, slow to rise    Ambulation/Gait Ambulation/Gait assistance: Supervision Gait Distance (Feet): 150 Feet Assistive device: None Gait Pattern/deviations: Step-through pattern, Wide base of support, Drifts right/left (feet externally rotated) Gait velocity: decreased     General Gait Details: slow with wide BOS, running into objects on L without awareness. reports he is at his baseline. VSS  Stairs Stairs: Yes Stairs assistance: Supervision Stair Management: Two rails, Alternating pattern, Forwards Number of Stairs: 4       Balance Overall balance assessment: Needs assistance, History of Falls Sitting-balance support: No upper extremity supported, Feet supported Sitting balance-Leahy Scale: Good     Standing balance support: During functional activity, No upper extremity supported Standing balance-Leahy Scale: Fair Standing balance comment: poor tolerance of challenge, running into objects on L                             Pertinent Vitals/Pain Pain Assessment Pain Assessment: No/denies pain    Home Living Family/patient expects to be discharged to:: Private residence   Available Help at Discharge: Family;Available 24 hours/day Type of Home: House Home Access: Stairs to enter Entrance Stairs-Rails: Right;Left;Can reach both Entrance Stairs-Number of Steps: 4  Home Layout: One level Home Equipment: None      Prior Function Prior Level of Function : Independent/Modified Independent;Working/employed;Driving;History of Falls (last six months) (two falls)             Mobility Comments: pt reports  clumsy but independent ADLs Comments: pt reports independent, still driving and working     Extremity/Trunk Assessment   Upper Extremity Assessment Upper Extremity Assessment: Overall WFL for tasks assessed    Lower Extremity Assessment Lower Extremity Assessment: Overall WFL for tasks assessed    Cervical / Trunk Assessment Cervical / Trunk Assessment: Normal;Other exceptions Cervical / Trunk Exceptions: large body habitus  Communication   Communication Communication: No apparent difficulties    Cognition Arousal: Alert Behavior During Therapy: WFL for tasks assessed/performed   PT - Cognitive impairments: Awareness, Safety/Judgement                       PT - Cognition Comments: pt running into various objects on L side (with foot and hand) without awareness. reports this is normal for him. Following commands: Intact       Cueing Cueing Techniques: Verbal cues     General Comments General comments (skin integrity, edema, etc.): VSS on RA, pt running into objects on L side with no awareness. pt asking about getting into fitness, recommended silver sneakers and personal trainer for increased guidance    Exercises     Assessment/Plan    PT Assessment Patient needs continued PT services  PT Problem List Decreased activity tolerance;Decreased balance;Decreased mobility;Decreased safety awareness       PT Treatment Interventions DME instruction;Gait training;Stair training;Functional mobility training;Therapeutic activities;Therapeutic exercise;Balance training;Patient/family education    PT Goals (Current goals can be found in the Care Plan section)  Acute Rehab PT Goals Patient Stated Goal: return home PT Goal Formulation: With patient Time For Goal Achievement: 04/23/24 Potential to Achieve Goals: Good    Frequency Min 2X/week        AM-PAC PT 6 Clicks Mobility  Outcome Measure Help needed turning from your back to your side while in a flat  bed without using bedrails?: None Help needed moving from lying on your back to sitting on the side of a flat bed without using bedrails?: A Little Help needed moving to and from a bed to a chair (including a wheelchair)?: A Little Help needed standing up from a chair using your arms (e.g., wheelchair or bedside chair)?: A Little Help needed to walk in hospital room?: A Little Help needed climbing 3-5 steps with a railing? : A Little 6 Click Score: 19    End of Session Equipment Utilized During Treatment: Gait belt Activity Tolerance: Patient tolerated treatment well Patient left: in chair;with call bell/phone within reach;with chair alarm set;with family/visitor present Nurse Communication: Mobility status PT Visit Diagnosis: Unsteadiness on feet (R26.81);Repeated falls (R29.6)    Time: 8797-8765 PT Time Calculation (min) (ACUTE ONLY): 32 min   Charges:   PT Evaluation $PT Eval Low Complexity: 1 Low PT Treatments $Therapeutic Exercise: 8-22 mins PT General Charges $$ ACUTE PT VISIT: 1 Visit         Izetta Call, PT, DPT   Acute Rehabilitation Department Office [phone] Secure Chat Communication Preferred  Izetta JULIANNA Call 04/09/2024, 1:35 PM

## 2024-04-09 NOTE — Assessment & Plan Note (Addendum)
 Home levothyroxine  88 mcg daily continued

## 2024-04-09 NOTE — Hospital Course (Addendum)
 [name] was admitted to the hospital with the working diagnosis of community acquired pneumonia.   78 yo male with the past medical history of hypertension, dyslipidemia, and prostate cancer, who presented with dyspnea and cough.  Apparently recently diagnosed with pneumonia and treated with antibiotic therapy.  Reported 4 to 5 days of worsening cough, dyspnea on exertion and fatigue.  On his initial physical examination his blood pressure was 146/77, HR 112, RR 22 and 02 saturation 93%lungs with no wheezing or rhonchi, positive rales left lower lobe, heart with S1 and S2 present and regular with no gallops, rubs or murmurs, abdomen with no distention and no lower extremity edema.   Na 132, K 4,1 Cl 101 bicarbonate 18, glucose 1000 bun 17 cr 1,46  AST 27 ALT 25  Lactic acid 3,0, 2.3, 2.0  Wbc 16.7 hgb 14.5 plt 155  Sars covid 19 negative Influenza negative RSV negative   Chest radiograph with mild cardiomegaly, no vascular congestion, faint interstitial retrocardiac infiltrate.   EKG 112 bpm, normal axis, normal intervals, qtc 406, sinus rhythm with no significant ST segment or T wave changes.   08/12 CT chest with large consolidation left lower lobe, today with persistent cough and wheezing.   8/14 H@H  progress note: patient had bradycardia at approximately 3am, noted by team in the 30s-40s. Patient not in acute distress. Already reduced Coreg  changed rom 12.5 mg BID to 6.25 mg BID (home dosing is 25 mg BID). Patient reporting cough that keeps him up at night, requesting medications. Tussionex BID prn for cough ordered, 2 days.

## 2024-04-09 NOTE — Assessment & Plan Note (Addendum)
 Continue home rosuvastatin  10 mg daily

## 2024-04-09 NOTE — Evaluation (Signed)
 Clinical/Bedside Swallow Evaluation Patient Details  Name: [name] MRN: [id number] Date of Birth: 23-Feb-1946  Today's Date: 04/09/2024 Time: SLP Start Time (ACUTE ONLY): 9057 SLP Stop Time (ACUTE ONLY): 0958 SLP Time Calculation (min) (ACUTE ONLY): 16 min  Past Medical History:  Past Medical History:  Diagnosis Date   Arthritis    knees. back   Bladder cancer (HCC)    2018   High cholesterol    Hypertension    Hypothyroidism    prostate cancer    2000, 2003   Thyroid  disease    Past Surgical History:  Past Surgical History:  Procedure Laterality Date   EYE SURGERY Bilateral 2014   HERNIA REPAIR Right 1984   inguinal   IR RADIOLOGIST EVAL & MGMT  03/19/2021   KNEE ARTHROSCOPY Left 1966   LUMBAR LAMINECTOMY/DECOMPRESSION MICRODISCECTOMY N/A 05/13/2022   Procedure: Microlumbar laminectomy Lumbar three-four , Lumbar four-five;  Surgeon: Duwayne Purchase, MD;  Location: [facility] OR;  Service: Orthopedics;  Laterality: N/A;   PENILE PROSTHESIS IMPLANT  2014   TOTAL KNEE ARTHROPLASTY Right 08/11/2020   Procedure: TOTAL KNEE ARTHROPLASTY;  Surgeon: Melodi Lerner, MD;  Location: [facility] ORS;  Service: Orthopedics;  Laterality: Right;    TRANSURETHRAL RESECTION OF BLADDER TUMOR  2018   HPI:  The pt is a 78 yo male presenting 8/10 with cough and SOB, found to have sepsis due to CAP. PMH includes: recent PNA resolved after antibiotics, HTN, hypothyroidism, prostate cancer, UTI, gout, arthritis, GERD, high cholesterol, and R TKA.    Assessment / Plan / Recommendation  Clinical Impression  Pt has functional appearing oropharyngeal swallow throughout POs tested during this evaluation, with no overt s/s of aspiration including with three ounces of water  consumed consecutively. He does endorse some coughing with thin liquids though. He initially reports rare instances of heartburn/reflux, but upon further questioning, he does report having a bad episode of reflux prior to his first case of PNA.  At the start of feeling ill this time, he had nausea and a lot of eructation that tasted like food. Question if he may be at more risk for post-prandial aspiration given these reports. However, given that he does have coughing with thin liquids, and that this is a recurrent case of PNA, he would like to proceed with MBS while inpatient to better evaluate swallowing function. Will leave on current diet for now with use of aspiration and esophageal precautions. Education provided to pt for now and will f/u for completion of MBS.   SLP Visit Diagnosis: Dysphagia, unspecified (R13.10)    Aspiration Risk       Diet Recommendation Regular;Thin liquid    Liquid Administration via: Cup;Straw Medication Administration: Whole meds with liquid Supervision: Patient able to self feed Compensations: Slow rate;Small sips/bites;Follow solids with liquid Postural Changes: Seated upright at 90 degrees;Remain upright for at least 30 minutes after po intake    Other  Recommendations Oral Care Recommendations: Oral care BID     Assistance Recommended at Discharge    Functional Status Assessment    Frequency and Duration            Prognosis Prognosis for improved oropharyngeal function: Good      Swallow Study   General HPI: The pt is a 78 yo male presenting 8/10 with cough and SOB, found to have sepsis due to CAP. PMH includes: recent PNA resolved after antibiotics, HTN, hypothyroidism, prostate cancer, UTI, gout, arthritis, GERD, high cholesterol, and R TKA. Type of Study: Bedside Swallow  Evaluation Previous Swallow Assessment: none in chart Diet Prior to this Study: Regular;Thin liquids (Level 0) Temperature Spikes Noted: No Respiratory Status: Room air History of Recent Intubation: No Behavior/Cognition: Alert;Cooperative;Pleasant mood Oral Cavity Assessment: Within Functional Limits Oral Care Completed by SLP: No Oral Cavity - Dentition: Adequate natural dentition Vision: Functional for  self-feeding Self-Feeding Abilities: Able to feed self Patient Positioning: Upright in bed Baseline Vocal Quality: Normal Volitional Cough: Strong Volitional Swallow: Able to elicit    Oral/Motor/Sensory Function Overall Oral Motor/Sensory Function: Within functional limits   Ice Chips Ice chips: Not tested   Thin Liquid Thin Liquid: Within functional limits Presentation: Self Fed;Straw    Nectar Thick Nectar Thick Liquid: Not tested   Honey Thick Honey Thick Liquid: Not tested   Puree Puree: Within functional limits Presentation: Self Fed;Spoon   Solid     Solid: Within functional limits Presentation: Self Fed      Leita SAILOR., M.A. CCC-SLP Acute Rehabilitation Services Office: [phone]  Secure chat preferred  04/09/2024,10:49 AM

## 2024-04-09 NOTE — Plan of Care (Signed)
   Problem: Activity: Goal: Risk for activity intolerance will decrease Outcome: Progressing   Problem: Coping: Goal: Level of anxiety will decrease Outcome: Progressing   Problem: Safety: Goal: Ability to remain free from injury will improve Outcome: Progressing

## 2024-04-10 ENCOUNTER — Inpatient Hospital Stay (HOSPITAL_COMMUNITY)

## 2024-04-10 DIAGNOSIS — R0602 Shortness of breath: Secondary | ICD-10-CM

## 2024-04-10 DIAGNOSIS — N179 Acute kidney failure, unspecified: Secondary | ICD-10-CM

## 2024-04-10 DIAGNOSIS — J189 Pneumonia, unspecified organism: Secondary | ICD-10-CM | POA: Diagnosis not present

## 2024-04-10 DIAGNOSIS — I10 Essential (primary) hypertension: Secondary | ICD-10-CM | POA: Diagnosis not present

## 2024-04-10 DIAGNOSIS — E039 Hypothyroidism, unspecified: Secondary | ICD-10-CM | POA: Diagnosis not present

## 2024-04-10 LAB — ECHOCARDIOGRAM COMPLETE
AR max vel: 1.78 cm2
AV Area VTI: 1.91 cm2
AV Area mean vel: 1.76 cm2
AV Mean grad: 11.5 mmHg
AV Peak grad: 21.4 mmHg
Ao pk vel: 2.32 m/s
Area-P 1/2: 3.66 cm2
Calc EF: 62.7 %
Height: 72 in
MV VTI: 2.85 cm2
S' Lateral: 2.81 cm
Single Plane A2C EF: 67.4 %
Single Plane A4C EF: 59.8 %
Weight: 4074.1 [oz_av]

## 2024-04-10 LAB — BASIC METABOLIC PANEL WITH GFR
Anion gap: 9 (ref 5–15)
BUN: 15 mg/dL (ref 8–23)
CO2: 22 mmol/L (ref 22–32)
Calcium: 9.7 mg/dL (ref 8.9–10.3)
Chloride: 106 mmol/L (ref 98–111)
Creatinine, Ser: 1.17 mg/dL (ref 0.61–1.24)
GFR, Estimated: >60 mL/min (ref >60)
Glucose, Bld: 93 mg/dL (ref 70–99)
Potassium: 3.9 mmol/L (ref 3.5–5.1)
Sodium: 137 mmol/L (ref 135–145)

## 2024-04-10 LAB — CBC WITH DIFFERENTIAL/PLATELET
Abs Immature Granulocytes: 0.04 K/uL (ref 0.00–0.07)
Basophils Absolute: 0 K/uL (ref 0.0–0.1)
Basophils Relative: 0 %
Eosinophils Absolute: 0.1 K/uL (ref 0.0–0.5)
Eosinophils Relative: 1 %
HCT: 35.7 % — ABNORMAL LOW (ref 39.0–52.0)
Hemoglobin: 11.9 g/dL — ABNORMAL LOW (ref 13.0–17.0)
Immature Granulocytes: 0 %
Lymphocytes Relative: 11 %
Lymphs Abs: 1.4 K/uL (ref 0.7–4.0)
MCH: 29.6 pg (ref 26.0–34.0)
MCHC: 33.3 g/dL (ref 30.0–36.0)
MCV: 88.8 fL (ref 80.0–100.0)
Monocytes Absolute: 1 K/uL (ref 0.1–1.0)
Monocytes Relative: 7 %
Neutro Abs: 10.7 K/uL — ABNORMAL HIGH (ref 1.7–7.7)
Neutrophils Relative %: 81 %
Platelets: 149 K/uL — ABNORMAL LOW (ref 150–400)
RBC: 4.02 MIL/uL — ABNORMAL LOW (ref 4.22–5.81)
RDW: 15.9 % — ABNORMAL HIGH (ref 11.5–15.5)
WBC: 13.1 K/uL — ABNORMAL HIGH (ref 4.0–10.5)
nRBC: 0 % (ref 0.0–0.2)

## 2024-04-10 LAB — MAGNESIUM: Magnesium: 2.2 mg/dL (ref 1.7–2.4)

## 2024-04-10 MED ORDER — PREDNISONE 20 MG PO TABS
40.0000 mg | ORAL_TABLET | Freq: Every day | ORAL | Status: DC
  Administered 2024-04-10 – 2024-04-13 (×6): 40 mg via ORAL
  Filled 2024-04-10 (×5): qty 2

## 2024-04-10 MED ORDER — IPRATROPIUM-ALBUTEROL 0.5-2.5 (3) MG/3ML IN SOLN
3.0000 mL | Freq: Four times a day (QID) | RESPIRATORY_TRACT | Status: DC
  Administered 2024-04-10 – 2024-04-14 (×17): 3 mL via RESPIRATORY_TRACT
  Filled 2024-04-10 (×20): qty 3

## 2024-04-10 MED ORDER — GUAIFENESIN-DM 100-10 MG/5ML PO SYRP
5.0000 mL | ORAL_SOLUTION | ORAL | Status: DC | PRN
  Administered 2024-04-12 – 2024-04-13 (×3): 5 mL via ORAL
  Filled 2024-04-10 (×9): qty 5

## 2024-04-10 NOTE — Evaluation (Signed)
 Modified Barium Swallow Study  Patient Details  Name: [name] MRN: [id number] Date of Birth: [birth year]  Today's Date: 04/10/2024  Modified Barium Swallow completed.  Full report located under Chart Review in the Imaging Section.  History of Present Illness The pt is a 78 yo male presenting 8/10 with cough and SOB, found to have sepsis due to CAP. PMH includes: recent PNA resolved after antibiotics, HTN, hypothyroidism, prostate cancer, UTI, gout, arthritis, GERD, high cholesterol, and R TKA.   Clinical Impression Pt has a functional oropharyngeal swallow with good efficiency and no aspiration observed. He appears to have large osteophytes that contribute to delayed clearance of the barium tablet from the valleculae, but this cleared with additional thin liquids boluses. Bolus flow of other consistencies was not impacted. Pt also had retention of barium in the esophagus with retrograde flow that stayed below the level of the UES. Suspect that his greatest risk for aspiration is post-prandial. Education was reinforced about esophageal precautions. Would continue regular solids and thin liquids with liquid washes.  Factors that may increase risk of adverse event in presence of aspiration Noe & Lianne 2021): Respiratory or GI disease  Swallow Evaluation Recommendations Recommendations: PO diet PO Diet Recommendation: Regular;Thin liquids (Level 0) Liquid Administration via: Cup;Straw Medication Administration: Whole meds with liquid Supervision: Patient able to self-feed Swallowing strategies  : Slow rate;Small bites/sips;Follow solids with liquids Postural changes: Position pt fully upright for meals;Stay upright 30-60 min after meals Oral care recommendations: Oral care BID (2x/day)      Leita SAILOR., M.A. CCC-SLP Acute Rehabilitation Services Office: [phone]  Secure chat preferred  04/10/2024,1:25 PM

## 2024-04-10 NOTE — Assessment & Plan Note (Addendum)
 Continue home carvedilol  dosing at 6.25 mg BID due to noted bradycardia Continue blood pressure monitoring   Addendum 04/12/2024 at 12:00: BP documented by EMT is 168/81. Given bradycardia, coreg  will remain 6.25 mg BID. In setting long term lower extremity edema noted by patient, we will initiated hydrochlorothiazide  6.25 mg daily staring at 1700 on 04/12/24. Continue to monitor CMP. RN informed.

## 2024-04-10 NOTE — TOC CM/SW Note (Signed)
 Transition of Care [facility]) - Inpatient Brief Assessment   Patient Details  Name: [name] MRN: [id number] Date of Birth: [birth year]  Transition of Care [facility] CM/SW Contact:    Waddell Barnie Rama, RN Phone Number: 04/10/2024, 4:19 PM   Clinical Narrative: From home with spouse, has PCP and insurance on file, states has no HH services in place at this time, has a walker at home.  States family member ( wife)  will transport them home at [employer] and family is support system, states gets medications from [facility] on [address]  He has no issues with his medications.  Pta self ambulatory. Patient gives this NCM permission to speak with wife.  There are no ICM needs identified  at this time.  Please place consult for ICM  needs.     Transition of Care Asessment: Insurance and Status: Insurance coverage has been reviewed Patient has primary care physician: Yes Home environment has been reviewed: home wiht wife Prior level of function:: ambulatory , has a Engineer, maintenance Home Services: Current home services (walker) Social Drivers of Health Review: SDOH reviewed no interventions necessary Readmission risk has been reviewed: Yes Transition of care needs: no transition of care needs at this time

## 2024-04-10 NOTE — Plan of Care (Signed)
 ?  Problem: Clinical Measurements: ?Goal: Will remain free from infection ?Outcome: Progressing ?  ?

## 2024-04-10 NOTE — Progress Notes (Addendum)
 Progress Note   Patient: [name] Nurse FMW:[id number] DOB: 05/01/1946 DOA: 04/08/2024     2 DOS: the patient was seen and examined on 04/10/2024   Brief hospital course: [name] was admitted to the hospital with the working diagnosis of community acquired pneumonia.   78 yo male with the past medical history of hypertension, dyslipidemia, and prostate cancer, who presented with dyspnea and cough.  Apparently recently diagnosed with pneumonia and treated with antibiotic therapy.  Reported 4 to 5 days of worsening cough, dyspnea on exertion and fatigue.  On his initial physical examination his blood pressure was 1467/77, HR 112, RR 22 and 02 saturation 93%lungs with no wheezing or rhonchi, positive rales left lower lobe, heart with S1 and S2 present and regular with no gallops, rubs or murmurs, abdomen with no distention and no lower extremity edema.   Na 132, K 4,1 Cl 101 bicarbonate 18, glucose 1000 bun 17 cr 1,46  AST 27 ALT 25  Lactic acid 3,0, 2.3, 2.0  Wbc 16.7 hgb 14.5 plt 155  Sars covid 19 negative Influenza negative RSV negative   Chest radiograph with mild cardiomegaly, no vascular congestion, faint interstitial retrocardiac infiltrate.   EKG 112 bpm, normal axis, normal intervals, qtc 406, sinus rhythm with no significant ST segment or T wave changes.   08/12 CT chest with large consolidation left lower lobe, today with persistent cough and wheezing.   Assessment and Plan: * CAP (community acquired pneumonia) Severe sepsis with lactic acidosis, AKI. (Present on admission) now resolving   CT chest with dense consolidation within the left lower lobe keeping with changes of acute lobar pneumonia. Trace of left para-mnemonic effusion. Diffuse airway inflammation.  Moderate multivessel coronary artery disease.    Plan to continue IV antibiotic therapy with Ceftriaxone , oral azithromycin .  Discontinue IV fluids.  Continue with bronchodilator therapy, incentive spirometer and  flutter valve.  Will add systemic corticosteroids for severe pneumonia.   Continue oxymetry monitoring and supplemental 02 per [place] as needed to keep 02 saturation greater than 92%   Pending Legionella and streptococcal  urinary antigens.   Telemetry with significant improvement in ectopy, with only occasional PAC and PVC, no further VT Follow up echocardiogram.   Essential hypertension Blood pressure has been stable Continue to hold on carvedilol  for now.  Continue blood pressure monitoring   AKI (acute kidney injury) (HCC) Hyponatremia.    Electrolytes have improved, renal function today with serum cr at 1.17 with K at 3,9 and serum bicarbonate at 22 Na 137 Mg 2.2    Hold on IV fluids. He had Mg IV yesterday to keep Mg above 2 Follow up renal function and electrolytes.  Avoid hypotension or nephrotoxic agents.   Gout No signs of acute flare, will continue with allopurinol .  Hypothyroidism Continue levothyroxine    Personal history of prostate cancer Follow up as outpatient  Hyperlipidemia Continue with rosuvastatin        Subjective: Patient not able to sleep well due to cough and wheezing, dyspnea has improved.   Physical Exam: Vitals:   04/09/24 1955 04/10/24 0035 04/10/24 0344 04/10/24 0840  BP: 129/68 139/79 (!) 162/80 (!) 150/88  Pulse: 73 70 64 67  Resp: 20 18 (!) 21 18  Temp: 98.3 F (36.8 C) 99 F (37.2 C) 98.4 F (36.9 C) 98.9 F (37.2 C)  TempSrc: Oral Oral Oral Oral  SpO2: 95% 95% 96% 97%  Weight:   115.5 kg   Height:       Neurology awake  and alert, deconditioned and ill looking appearing ENT with no pallor Cardiovascular with S1 and S2 present and regular with no gallops, or rubs. Positive systolic murmur No JVD Respiratory with bilateral rales at bases more left than right, positive diffused bilateral wheezing inspiratory and expiratory, with no rhonchi No accessory muscle use Abdomen with no distention  No lower extremity edema  Data  Reviewed:    Family Communication: no family at the bedside   Disposition: Status is: Inpatient Remains inpatient appropriate because: IV antibiotics   Planned Discharge Destination: Home     Author: Elidia Toribio Furnace, MD 04/10/2024 2:39 PM  For on call review [email or web address].

## 2024-04-10 NOTE — Progress Notes (Signed)
  Echocardiogram 2D Echocardiogram has been performed.  Devora Ellouise SAUNDERS 04/10/2024, 1:46 PM

## 2024-04-11 ENCOUNTER — Encounter (HOSPITAL_COMMUNITY): Payer: Self-pay | Admitting: Internal Medicine

## 2024-04-11 DIAGNOSIS — E871 Hypo-osmolality and hyponatremia: Secondary | ICD-10-CM | POA: Diagnosis not present

## 2024-04-11 DIAGNOSIS — I350 Nonrheumatic aortic (valve) stenosis: Secondary | ICD-10-CM

## 2024-04-11 DIAGNOSIS — I10 Essential (primary) hypertension: Secondary | ICD-10-CM | POA: Diagnosis not present

## 2024-04-11 DIAGNOSIS — N179 Acute kidney failure, unspecified: Secondary | ICD-10-CM | POA: Diagnosis not present

## 2024-04-11 DIAGNOSIS — J189 Pneumonia, unspecified organism: Secondary | ICD-10-CM | POA: Diagnosis not present

## 2024-04-11 LAB — BASIC METABOLIC PANEL WITH GFR
Anion gap: 9 (ref 5–15)
BUN: 15 mg/dL (ref 8–23)
CO2: 21 mmol/L — ABNORMAL LOW (ref 22–32)
Calcium: 9.8 mg/dL (ref 8.9–10.3)
Chloride: 108 mmol/L (ref 98–111)
Creatinine, Ser: 1.1 mg/dL (ref 0.61–1.24)
GFR, Estimated: >60 mL/min (ref >60)
Glucose, Bld: 129 mg/dL — ABNORMAL HIGH (ref 70–99)
Potassium: 3.7 mmol/L (ref 3.5–5.1)
Sodium: 138 mmol/L (ref 135–145)

## 2024-04-11 LAB — CBC
HCT: 37.2 % — ABNORMAL LOW (ref 39.0–52.0)
Hemoglobin: 12.6 g/dL — ABNORMAL LOW (ref 13.0–17.0)
MCH: 29.4 pg (ref 26.0–34.0)
MCHC: 33.9 g/dL (ref 30.0–36.0)
MCV: 86.7 fL (ref 80.0–100.0)
Platelets: 196 K/uL (ref 150–400)
RBC: 4.29 MIL/uL (ref 4.22–5.81)
RDW: 15.8 % — ABNORMAL HIGH (ref 11.5–15.5)
WBC: 5 K/uL (ref 4.0–10.5)
nRBC: 0 % (ref 0.0–0.2)

## 2024-04-11 MED ORDER — CARVEDILOL 12.5 MG PO TABS
12.5000 mg | ORAL_TABLET | Freq: Two times a day (BID) | ORAL | Status: DC
  Administered 2024-04-11 (×2): 12.5 mg via ORAL
  Filled 2024-04-11 (×4): qty 1

## 2024-04-11 MED ORDER — ALLOPURINOL 100 MG PO TABS
100.0000 mg | ORAL_TABLET | Freq: Two times a day (BID) | ORAL | Status: DC
Start: 2024-04-11 — End: 2024-04-14
  Administered 2024-04-11 – 2024-04-14 (×7): 100 mg via ORAL
  Filled 2024-04-11 (×8): qty 1

## 2024-04-11 MED ORDER — VITAMIN C 500 MG PO TABS
500.0000 mg | ORAL_TABLET | Freq: Every day | ORAL | Status: DC
  Administered 2024-04-12 – 2024-04-14 (×3): 500 mg via ORAL
  Filled 2024-04-11 (×4): qty 1

## 2024-04-11 MED ORDER — AMLODIPINE BESYLATE 2.5 MG PO TABS: 2.5000 mg | ORAL_TABLET | Freq: Every day | ORAL | Status: DC

## 2024-04-11 NOTE — Assessment & Plan Note (Addendum)
 Echo 04/10/24: est EF 60-65%, LV diastolic function was read normal. RVS function and size were read as normal.  Noted mild aortic stenosis on imaging-unchanged from prior  Followed by outpatient cardiology

## 2024-04-11 NOTE — Progress Notes (Addendum)
 Per Current Health data, patient stable, no new alerts since last  Bradypnea for Respiratory Rate alert at 2028. Will continue to monitor and update any events after completion of system downtime.

## 2024-04-11 NOTE — Progress Notes (Signed)
 Mobility Specialist Progress Note:   04/11/24 0950  Mobility  Activity Ambulated independently  Level of Assistance Modified independent, requires aide device or extra time  Assistive Device None  Distance Ambulated (ft) 300 ft  Activity Response Tolerated well  Mobility Referral Yes  Mobility visit 1 Mobility  Mobility Specialist Start Time (ACUTE ONLY) 0950  Mobility Specialist Stop Time (ACUTE ONLY) 1002  Mobility Specialist Time Calculation (min) (ACUTE ONLY) 12 min   Pt agreeable to mobility session. Ambulated independently with no assistance needed. Pt c/o decr endurance, encouraged frequent ambulation. Back in bed with all needs met.   Therisa Rana Mobility Specialist Please contact via SecureChat or  Rehab office at [phone]

## 2024-04-11 NOTE — Progress Notes (Incomplete)
 This was for the patients admission visit.  Hospital Room: Arrived to the patients room on [address]. The patient was in good spirits, in no obvious signs of distress. ABCs are intact. Skin is warm, dry, and pink. No signs of shortness of breath. The patient reports BM this morning.   Transport: The packaging, transport, unloading and loading was conduct by the Paramedic. The patient was transferred via wheelchair to the [facility]. The patient was secured approprietly. No complaints.

## 2024-04-11 NOTE — Plan of Care (Signed)
  Problem: Clinical Measurements: Goal: Will remain free from infection Outcome: Progressing    Problem: Safety: Goal: Ability to remain free from injury will improve Outcome: Progressing   Problem: Clinical Measurements: Goal: Ability to maintain a body temperature in the normal range will improve Outcome: Progressing   Problem: Clinical Measurements: Goal: Ability to maintain clinical measurements within normal limits will improve Outcome: Progressing

## 2024-04-11 NOTE — Plan of Care (Signed)

## 2024-04-11 NOTE — Progress Notes (Addendum)
 21- RN introduction, Patient identifiers completed. Patient confirmed just getting out of the shower and current health wearable is back on. Plan for overnight monitoring, medication administration assistance, [facility] contact information reviewed. Patient agreed to a video call for  scheduled 1900 PO medication assistance and assessment now.      1932 Patient contact via Video call.  Patient identifiers review  patient A&O x4. Patient sitting at table.  Patients denies pain, discomfort,  SOB or health concerns. Assessment and medication administration assistance completed. Overnight monitoring plan and [facility] contact information, when to call RN or for emergency needs per Code Status, and plan for AM medications reviewed. Per Patient no other questions or concerns at the time of video call. Patient request to have early AM medication times adjusted to support sleeping. Patient refused scheduled nebulizer at 0200 but will call for PRN dose if needed. Agreed to 2200 medication administration at 2100. Patient encouraged to call as needed and will continue to be monitored via current health monitoring.     2025-Patient alerting on Current Health monitor Resp Rate.  Alarm Bradypnea for Respiratory Rate. Patient confirmed doing well. No change to condition requiring emergent attention. RR currently 17rpm at the time of contact call. Patient will continue to be monitored. Note updated in current health.     2103---- Patient contact via Video call. Patient identifiers review. Patient resting in recliner chair, reviewed plan to wear wearable throughout the night. Medication scheduled for 2200 completed. Patient updated on request for AM medication time adjustments. Patient encouraged to contact [facility] RN as needed.

## 2024-04-11 NOTE — Progress Notes (Signed)
 PROGRESS NOTE  Rein Popov FMW:[id number] DOB: [birth year] DOA: 04/08/2024 PCP: Thedora [name], MD   LOS: 3 days   Brief Narrative / Interim history: 78 yo male with the past medical history of hypertension, dyslipidemia, and prostate cancer, who presented with dyspnea and cough.  He was found to have pneumonia, started on antibiotics and admitted to the hospital.  Subjective / 24h Interval events: He is doing much better today, feels significantly improved.  He is on room air.  Does complain of persistent cough  Assesement and Plan: Principal Problem:   CAP (community acquired pneumonia) Active Problems:   Essential hypertension   AKI (acute kidney injury) (HCC)   Gout   Hypothyroidism   Personal history of prostate cancer   Hyperlipidemia   Hyponatremia   Mild aortic stenosis  Principal problem Community-acquired pneumonia-septic on admission with acute kidney injury, lactic acidosis.  CT scan on admission with dense consolidation in the left lower lobe and trace of left parapneumonic effusion.  Has been placed on antibiotics, continue IV antibiotics.  Respiratory status improving but still has a persistent cough - Patient stable for, and I agree with hospital at home, discussed with Dr. 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  He would benefit from weight loss  Scheduled Meds:  allopurinol   100 mg Oral Daily   aspirin  EC  81 mg Oral Daily   azithromycin   500 mg Oral Daily   citalopram   20 mg Oral Daily   enoxaparin  (LOVENOX ) injection  40 mg Subcutaneous Q24H   folic acid   1 mg Oral Daily   ipratropium-albuterol   3 mL Nebulization Q6H   levothyroxine    88 mcg Oral Q0600   pantoprazole   40 mg Oral Daily   predniSONE   40 mg Oral Q breakfast   rosuvastatin   10 mg Oral Daily   Continuous Infusions:  cefTRIAXone  (ROCEPHIN )  IV 1 g (04/10/24 1236)   PRN Meds:.albuterol , guaiFENesin -dextromethorphan , promethazine   Current Outpatient Medications  Medication Instructions   albuterol  (VENTOLIN  HFA) 108 (90 Base) MCG/ACT inhaler Inhale 2 puffs into the lungs at bedtime as needed for nighttime cough/wheezing   allopurinol  (ZYLOPRIM ) 100 MG tablet TAKE 1 TABLET(100 MG) BY MOUTH TWICE DAILY   amoxicillin -clavulanate (AUGMENTIN ) 875-125 MG tablet 1 tablet, 2 times daily   aspirin  EC 81 mg, Oral, Daily, Swallow whole.   carvedilol  (COREG ) 12.5 mg, Oral, 2 times daily with meals, TAKE 1 TABLET(25 MG) BY MOUTH TWICE DAILY   cholecalciferol (VITAMIN D3) 1,000 Units, Oral, Daily   citalopram  (CELEXA ) 20 MG tablet TAKE 1 TABLET(20 MG) BY MOUTH DAILY   Coenzyme Q10 (COQ10 PO) 1 capsule, Daily   doxycycline  (VIBRA -TABS) 100 mg, Oral, 2 times daily   folic acid  (FOLVITE ) 800 mcg, Daily   ibuprofen (ADVIL) 600 mg, Every 6 hours PRN   levothyroxine  (SYNTHROID ) 88 MCG tablet TAKE 1 TABLET(88 MCG) BY MOUTH DAILY BEFORE BREAKFAST   loratadine  (CLARITIN ) 10 mg, Oral, Daily   OVER THE COUNTER MEDICATION 2 capsules, Daily   pantoprazole  (PROTONIX ) 40 mg, Oral, Daily   rosuvastatin  (CRESTOR ) 10 MG tablet TAKE 1 TABLET(10 MG) BY MOUTH DAILY    Diet Orders (From admission, onward)     Start     Ordered   04/10/24 1438  Diet regular Fluid  consistency: Thin  Diet effective now       Question:  Fluid consistency:  Answer:  Thin   04/10/24 1437            DVT prophylaxis: Place TED hose Start: 04/08/24 1821 enoxaparin  (LOVENOX ) injection 40 mg Start: 04/08/24 1800   Lab Results  Component Value Date   PLT 196 04/11/2024      Code Status: Full Code  Family Communication: No family at bedside  Status is: Inpatient Remains inpatient appropriate  because: IV antibiotics, transitioning to hospital at home   Level of care: Hospital at Home Med-Surg  Consultants:  None  Objective: Vitals:   04/11/24 0502 04/11/24 0712 04/11/24 0747 04/11/24 0805  BP: (!) 167/89  131/72 (!) 149/78  Pulse: 65  72 72  Resp: 19  16   Temp: 97.7 F (36.5 C)  97.7 F (36.5 C)   TempSrc: Oral  Oral Oral  SpO2: 98% 98% 98%   Weight: 113.7 kg     Height:        Intake/Output Summary (Last 24 hours) at 04/11/2024 0935 Last data filed at 04/11/2024 0503 Gross per 24 hour  Intake 480 ml  Output --  Net 480 ml   Wt Readings from Last 3 Encounters:  04/11/24 113.7 kg  02/15/24 115.7 kg  01/31/24 114.9 kg    Examination:  Constitutional: NAD Eyes: no scleral icterus ENMT: Mucous membranes are moist.  Neck: normal, supple Respiratory: Bibasilar rhonchi, no wheezing Cardiovascular: Regular rate and rhythm, no murmurs / rubs / gallops. No LE edema.  Abdomen: non distended, no tenderness. Bowel sounds positive.  Musculoskeletal: no clubbing / cyanosis.   Data Reviewed: I have independently reviewed following labs and imaging studies   CBC Recent Labs  Lab 04/08/24 1531 04/09/24 0257 04/10/24 0221 04/11/24 0221  WBC 16.7* 13.2* 13.1* 5.0  HGB 14.5 12.4* 11.9* 12.6*  HCT 44.1 36.9* 35.7* 37.2*  PLT 155 138* 149* 196  MCV 88.9 87.9 88.8 86.7  MCH 29.2 29.5 29.6 29.4  MCHC 32.9 33.6 33.3 33.9  RDW 15.9* 15.9* 15.9* 15.8*  LYMPHSABS 0.7 0.8 1.4  --   MONOABS 0.7 0.4 1.0  --   EOSABS 0.0 0.0 0.1  --   BASOSABS 0.0 0.0 0.0  --     Recent Labs  Lab 04/08/24 1531 04/08/24 1545 04/08/24 1736 04/08/24 1748 04/09/24 0257 04/09/24 1134 04/09/24 1354 04/10/24 0221 04/11/24 0221  NA 132*  --   --   --  133*  --   --  137 138  K 4.1  --   --   --  3.8  --   --  3.9 3.7  CL 101  --   --   --  102  --   --  106 108  CO2 18*  --   --   --  21*  --   --  22 21*  GLUCOSE 100*  --   --   --  87  --   --  93 129*  BUN 17  --   --   --   16  --   --  15 15  CREATININE 1.46*  --   --   --  1.34*  --   --  1.17 1.10  CALCIUM  9.8  --   --   --  8.9  --   --  9.7 9.8  AST 27  --   --   --   --   --   --   --   --  ALT 25  --   --   --   --   --   --   --   --   ALKPHOS 72  --   --   --   --   --   --   --   --   BILITOT 1.2  --   --   --   --   --   --   --   --   ALBUMIN 3.2*  --   --   --   --   --   --   --   --   MG  --   --   --   --   --  1.7  --  2.2  --   CRP  --   --  34.1*  --   --   --   --   --   --   PROCALCITON  --   --  8.30  --   --   --   --   --   --   LATICACIDVEN  --  3.0*  --  2.3* 2.0* 1.5 1.4  --   --     ------------------------------------------------------------------------------------------------------------------ No results for input(s): CHOL, HDL, LDLCALC, TRIG, CHOLHDL, LDLDIRECT in the last 72 hours.  Lab Results  Component Value Date   HGBA1C 5.8 02/11/2023   ------------------------------------------------------------------------------------------------------------------ No results for input(s): TSH, T4TOTAL, T3FREE, THYROIDAB in the last 72 hours.  Invalid input(s): FREET3  Cardiac Enzymes No results for input(s): CKMB, TROPONINI, MYOGLOBIN in the last 168 hours.  Invalid input(s): CK ------------------------------------------------------------------------------------------------------------------    Component Value Date/Time   BNP 59.6 07/05/2022 0429    CBG: No results for input(s): GLUCAP in the last 168 hours.  Recent Results (from the past 240 hours)  Resp panel by RT-PCR (RSV, Flu A&B, Covid) Anterior Nasal Swab     Status: None   Collection Time: 04/08/24  3:32 PM   Specimen: Anterior Nasal Swab  Result Value Ref Range Status   SARS Coronavirus 2 by RT PCR NEGATIVE NEGATIVE Final   Influenza A by PCR NEGATIVE NEGATIVE Final   Influenza B by PCR NEGATIVE NEGATIVE Final    Comment: (NOTE) The Xpert Xpress SARS-CoV-2/FLU/RSV plus assay  is intended as an aid in the diagnosis of influenza from Nasopharyngeal swab specimens and should not be used as a sole basis for treatment. Nasal washings and aspirates are unacceptable for Xpert Xpress SARS-CoV-2/FLU/RSV testing.  Fact Sheet for Patients: BloggerCourse.com  Fact Sheet for Healthcare Providers: SeriousBroker.it  This test is not yet approved or cleared by the [place]  FDA and has been authorized for detection and/or diagnosis of SARS-CoV-2 by FDA under an Emergency Use Authorization (EUA). This EUA will remain in effect (meaning this test can be used) for the duration of the COVID-19 declaration under Section 564(b)(1) of the Act, 21 U.S.C. section 360bbb-3(b)(1), unless the authorization is terminated or revoked.     Resp Syncytial Virus by PCR NEGATIVE NEGATIVE Final    Comment: (NOTE) Fact Sheet for Patients: BloggerCourse.com  Fact Sheet for Healthcare Providers: SeriousBroker.it  This test is not yet approved or cleared by the [place]  FDA and has been authorized for detection and/or diagnosis of SARS-CoV-2 by FDA under an Emergency Use Authorization (EUA). This EUA will remain in effect (meaning this test can be used) for the duration of the COVID-19 declaration under Section 564(b)(1) of the Act, 21 U.S.C. section 360bbb-3(b)(1), unless the authorization is  terminated or revoked.  Performed at [facility], [address], [place], [place] [address]   Blood culture (routine x 2)     Status: None (Preliminary result)   Collection Time: 04/08/24  5:36 PM   Specimen: BLOOD  Result Value Ref Range Status   Specimen Description BLOOD RIGHT ANTECUBITAL  Final   Special Requests   Final    BOTTLES DRAWN AEROBIC AND ANAEROBIC Blood Culture adequate volume   Culture   Final    NO GROWTH 3 DAYS Performed at [facility], [address] [place], [place] [address]    Report Status PENDING  Incomplete  Blood culture (routine x 2)     Status: None (Preliminary result)   Collection Time: 04/08/24  5:41 PM   Specimen: BLOOD  Result Value Ref Range Status   Specimen Description BLOOD BLOOD RIGHT HAND  Final   Special Requests   Final    AEROBIC BOTTLE ONLY Blood Culture results may not be optimal due to an inadequate volume of blood received in culture bottles   Culture   Final    NO GROWTH 3 DAYS Performed at [facility], [address] [place], [place] [address]    Report Status PENDING  Incomplete  MRSA Next Gen by PCR, Nasal     Status: None   Collection Time: 04/08/24 10:00 PM   Specimen: Nasal Mucosa; Nasal Swab  Result Value Ref Range Status   MRSA by PCR Next Gen NOT DETECTED NOT DETECTED Final    Comment: (NOTE) The GeneXpert MRSA Assay (FDA approved for NASAL specimens only), is one component of a comprehensive MRSA colonization surveillance program. It is not intended to diagnose MRSA infection nor to guide or monitor treatment for MRSA infections. Test performance is not FDA approved in patients less than 59 years old. Performed at [facility], [address] [place], [place] [address]      Radiology Studies: ECHOCARDIOGRAM COMPLETE Result Date: 04/10/2024    ECHOCARDIOGRAM REPORT   Patient Name:   [name] Date of Exam: 04/10/2024 Medical Rec #:  [id number]    Height:       72.0 in Accession #:    [id number]   Weight:       254.6 lb Date of Birth:  15-Mar-1946     BSA:          2.361 m Patient Age:    78 years     BP:           150/88 mmHg Patient Gender: M            HR:           69 bpm. Exam Location:  Inpatient Procedure: 2D Echo, Cardiac Doppler and Color Doppler (Both Spectral and Color            Flow Doppler were utilized during procedure). Indications:    R06.02 SOB  History:        Patient has prior history of Echocardiogram examinations, most                 recent 10/20/2022.  Signs/Symptoms:Dyspnea and Shortness of                 Breath; Risk Factors:Hypertension and Dyslipidemia. Pneumonia.  Sonographer:    Ellouise Mose RDCS Referring Phys: [id number] [facility] ARRIEN  Sonographer Comments: Patient is obese. Image acquisition challenging due to patient body habitus. IMPRESSIONS  1.  Left ventricular ejection fraction, by estimation, is 60 to 65%. The left ventricle has normal function. The left ventricle has no regional wall motion abnormalities. There is mild concentric left ventricular hypertrophy. Left ventricular diastolic parameters were normal.  2. Right ventricular systolic function is normal. The right ventricular size is normal. Tricuspid regurgitation signal is inadequate for assessing PA pressure.  3. The mitral valve is degenerative. Mild mitral valve regurgitation. No evidence of mitral stenosis.  4. The aortic valve is tricuspid. There is moderate calcification of the aortic valve. Aortic valve regurgitation is trivial. Mild aortic valve stenosis. Aortic valve area, by VTI measures 1.91 cm. Aortic valve mean gradient measures 11.5 mmHg. Aortic valve Vmax measures 2.32 m/s.  5. The inferior vena cava is normal in size with greater than 50% respiratory variability, suggesting right atrial pressure of 3 mmHg. FINDINGS  Left Ventricle: Left ventricular ejection fraction, by estimation, is 60 to 65%. The left ventricle has normal function. The left ventricle has no regional wall motion abnormalities. The left ventricular internal cavity size was normal in size. There is  mild concentric left ventricular hypertrophy. Left ventricular diastolic parameters were normal. Right Ventricle: The right ventricular size is normal. No increase in right ventricular wall thickness. Right ventricular systolic function is normal. Tricuspid regurgitation signal is inadequate for assessing PA pressure. Left Atrium: Left atrial size was normal in size. Right Atrium: Right atrial size was normal  in size. Pericardium: There is no evidence of pericardial effusion. Mitral Valve: The mitral valve is degenerative in appearance. Mild mitral annular calcification. Mild mitral valve regurgitation. No evidence of mitral valve stenosis. MV peak gradient, 7.1 mmHg. The mean mitral valve gradient is 3.0 mmHg. Tricuspid Valve: The tricuspid valve is grossly normal. Tricuspid valve regurgitation is trivial. No evidence of tricuspid stenosis. Aortic Valve: The aortic valve is tricuspid. There is moderate calcification of the aortic valve. Aortic valve regurgitation is trivial. Mild aortic stenosis is present. Aortic valve mean gradient measures 11.5 mmHg. Aortic valve peak gradient measures 21.4 mmHg. Aortic valve area, by VTI measures 1.91 cm. Pulmonic Valve: The pulmonic valve was normal in structure. Pulmonic valve regurgitation is not visualized. No evidence of pulmonic stenosis. Aorta: The aortic root and ascending aorta are structurally normal, with no evidence of dilitation. Venous: The right lower pulmonary vein is normal. The inferior vena cava is normal in size with greater than 50% respiratory variability, suggesting right atrial pressure of 3 mmHg. IAS/Shunts: No atrial level shunt detected by color flow Doppler.  LEFT VENTRICLE PLAX 2D LVIDd:         4.42 cm     Diastology LVIDs:         2.81 cm     LV e' medial:    9.57 cm/s LV PW:         1.10 cm     LV E/e' medial:  13.4 LV IVS:        1.22 cm     LV e' lateral:   9.79 cm/s LVOT diam:     2.30 cm     LV E/e' lateral: 13.1 LV SV:         96 LV SV Index:   41 LVOT Area:     4.15 cm  LV Volumes (MOD) LV vol d, MOD A2C: 89.7 ml LV vol d, MOD A4C: 77.1 ml LV vol s, MOD A2C: 29.2 ml LV vol s, MOD A4C: 31.0 ml LV SV MOD A2C:     60.5 ml  LV SV MOD A4C:     77.1 ml LV SV MOD BP:      52.1 ml RIGHT VENTRICLE            IVC RV S prime:     7.40 cm/s  IVC diam: 1.90 cm TAPSE (M-mode): 1.6 cm LEFT ATRIUM             Index        RIGHT ATRIUM           Index LA diam:         3.27 cm 1.39 cm/m   RA Area:     16.20 cm LA Vol (A2C):   61.1 ml 25.88 ml/m  RA Volume:   40.00 ml  16.94 ml/m LA Vol (A4C):   15.8 ml 6.69 ml/m LA Biplane Vol: 31.6 ml 13.39 ml/m  AORTIC VALVE AV Area (Vmax):    1.78 cm AV Area (Vmean):   1.76 cm AV Area (VTI):     1.91 cm AV Vmax:           231.50 cm/s AV Vmean:          154.500 cm/s AV VTI:            0.502 m AV Peak Grad:      21.4 mmHg AV Mean Grad:      11.5 mmHg LVOT Vmax:         99.10 cm/s LVOT Vmean:        65.300 cm/s LVOT VTI:          0.231 m LVOT/AV VTI ratio: 0.46  AORTA Ao Root diam: 3.74 cm Ao Asc diam:  3.47 cm MITRAL VALVE MV Area (PHT): 3.66 cm     SHUNTS MV Area VTI:   2.85 cm     Systemic VTI:  0.23 m MV Peak grad:  7.1 mmHg     Systemic Diam: 2.30 cm MV Mean grad:  3.0 mmHg MV Vmax:       1.33 m/s MV Vmean:      82.0 cm/s MV Decel Time: 208 msec MV E velocity: 128.00 cm/s MV A velocity: 107.00 cm/s MV E/A ratio:  1.20 Darryle Decent MD Electronically signed by Darryle Decent MD Signature Date/Time: 04/10/2024/2:50:08 PM    Final    DG Swallowing Func-Speech Pathology Result Date: 04/10/2024 Table formatting from the original result was not included. Modified Barium Swallow Study Patient Details Name: Kelan Pritt MRN: [id number] Date of Birth: 06/17/1946 Today's Date: 04/10/2024 HPI/PMH: HPI: The pt is a 78 yo male presenting 8/10 with cough and SOB, found to have sepsis due to CAP. PMH includes: recent PNA resolved after antibiotics, HTN, hypothyroidism, prostate cancer, UTI, gout, arthritis, GERD, high cholesterol, and R TKA. Clinical Impression: Pt has a functional oropharyngeal swallow with good efficiency and no aspiration observed. He appears to have large osteophytes that contribute to delayed clearance of the barium tablet from the valleculae, but this cleared with additional thin liquids boluses. Bolus flow of other consistencies was not impacted. Pt also had retention of barium in the esophagus with retrograde flow that  stayed below the level of the UES. Suspect that his greatest risk for aspiration is post-prandial. Education was reinforced about esophageal precautions. Would continue regular solids and thin liquids with liquid washes.  Factors that may increase risk of adverse event in presence of aspiration Noe & Lianne 2021): Factors that may increase risk of adverse event in presence of aspiration Noe & Lianne 2021):  Respiratory or GI disease Recommendations/Plan: Swallowing Evaluation Recommendations Swallowing Evaluation Recommendations Recommendations: PO diet PO Diet Recommendation: Regular; Thin liquids (Level 0) Liquid Administration via: Cup; Straw Medication Administration: Whole meds with liquid Supervision: Patient able to self-feed Swallowing strategies  : Slow rate; Small bites/sips; Follow solids with liquids Postural changes: Position pt fully upright for meals; Stay upright 30-60 min after meals Oral care recommendations: Oral care BID (2x/day) Treatment Plan Treatment Plan Treatment recommendations: No treatment recommended at this time Follow-up recommendations: No SLP follow up Recommendations Recommendations for follow up therapy are one component of a multi-disciplinary discharge planning process, led by the attending physician.  Recommendations may be updated based on patient status, additional functional criteria and insurance authorization. Assessment: Orofacial Exam: Orofacial Exam Oral Cavity: Oral Hygiene: WFL Oral Cavity - Dentition: Adequate natural dentition Orofacial Anatomy: WFL Oral Motor/Sensory Function: WFL Anatomy: Anatomy: Suspected cervical osteophytes Boluses Administered: Boluses Administered Boluses Administered: Thin liquids (Level 0); Mildly thick liquids (Level 2, nectar thick); Puree; Solid  Oral Impairment Domain: Oral Impairment Domain Lip Closure: No labial escape Tongue control during bolus hold: Cohesive bolus between tongue to palatal seal Bolus  preparation/mastication: Timely and efficient chewing and mashing Bolus transport/lingual motion: Brisk tongue motion Oral residue: Residue collection on oral structures (cleared with spontaneous second swallow) Location of oral residue : Tongue Initiation of pharyngeal swallow : Pyriform sinuses  Pharyngeal Impairment Domain: Pharyngeal Impairment Domain Soft palate elevation: No bolus between soft palate (SP)/pharyngeal wall (PW) Laryngeal elevation: Complete superior movement of thyroid  cartilage with complete approximation of arytenoids to epiglottic petiole Anterior hyoid excursion: Partial anterior movement Epiglottic movement: Complete inversion Laryngeal vestibule closure: Complete, no air/contrast in laryngeal vestibule Pharyngeal stripping wave : Present - complete Pharyngeal contraction (A/P view only): N/A Pharyngoesophageal segment opening: Partial distention/partial duration, partial obstruction of flow Tongue base retraction: No contrast between tongue base and posterior pharyngeal wall (PPW) Pharyngeal residue: Complete pharyngeal clearance Location of pharyngeal residue: N/A  Esophageal Impairment Domain: Esophageal Impairment Domain Esophageal clearance upright position: Esophageal retention with retrograde flow below pharyngoesophageal segment (PES) Pill: Pill Consistency administered: Thin liquids (Level 0) Thin liquids (Level 0): Impaired (see clinical impressions) Penetration/Aspiration Scale Score: Penetration/Aspiration Scale Score 1.  Material does not enter airway: Puree; Solid; Pill 2.  Material enters airway, remains ABOVE vocal cords then ejected out: Thin liquids (Level 0); Mildly thick liquids (Level 2, nectar thick) Compensatory Strategies: Compensatory Strategies Compensatory strategies: Yes Liquid wash: Effective Effective Liquid Wash: Pill   General Information: Caregiver present: No  Diet Prior to this Study: Regular; Thin liquids (Level 0)   Temperature : Normal   Respiratory  Status: WFL   Supplemental O2: None (Room air)   History of Recent Intubation: No  Behavior/Cognition: Alert; Cooperative; Pleasant mood Self-Feeding Abilities: Able to self-feed Baseline vocal quality/speech: Normal Volitional Cough: Able to elicit Volitional Swallow: Able to elicit Exam Limitations: No limitations Goal Planning: Prognosis for improved oropharyngeal function: Good No data recorded No data recorded Patient/Family Stated Goal: to feel better Consulted and agree with results and recommendations: Patient Pain: Pain Assessment Pain Assessment: Faces Faces Pain Scale: 0 End of Session: Start Time:SLP Start Time (ACUTE ONLY): 1157 Stop Time: SLP Stop Time (ACUTE ONLY): 1212 Time Calculation:SLP Time Calculation (min) (ACUTE ONLY): 15 min Charges: SLP Evaluations $ SLP Speech Visit: 1 Visit SLP Evaluations $BSS Swallow: 1 Procedure $MBS Swallow: 1 Procedure SLP visit diagnosis: SLP Visit Diagnosis: Dysphagia, unspecified (R13.10) Past Medical History: Past Medical History: Diagnosis Date  Arthritis  knees. back  Bladder cancer (HCC)   2018  High cholesterol   Hypertension   Hypothyroidism   prostate cancer   2000, 2003  Thyroid  disease  Past Surgical History: Past Surgical History: Procedure Laterality Date  EYE SURGERY Bilateral 2014  HERNIA REPAIR Right 1984  inguinal  IR RADIOLOGIST EVAL & MGMT  03/19/2021  KNEE ARTHROSCOPY Left 1966  LUMBAR LAMINECTOMY/DECOMPRESSION MICRODISCECTOMY N/A 05/13/2022  Procedure: Microlumbar laminectomy Lumbar three-four , Lumbar four-five;  Surgeon: Duwayne Purchase, MD;  Location: [facility] OR;  Service: Orthopedics;  Laterality: N/A;  PENILE PROSTHESIS IMPLANT  2014  TOTAL KNEE ARTHROPLASTY Right 08/11/2020  Procedure: TOTAL KNEE ARTHROPLASTY;  Surgeon: Melodi Lerner, MD;  Location: [facility] ORS;  Service: Orthopedics;  Laterality: Right;   TRANSURETHRAL RESECTION OF BLADDER TUMOR  2018 Leita SAILOR., M.A. CCC-SLP Acute Rehabilitation Services Office: [phone] Secure chat  preferred 04/10/2024, 1:31 PM    Nilda Fendt, MD, PhD [facility]  Between 7 am - 7 pm I am available, please contact me via Amion (for emergencies) or Securechat (non urgent messages)  Between 7 pm - 7 am I am not available, please contact night coverage MD/APP via Amion

## 2024-04-11 NOTE — Assessment & Plan Note (Addendum)
 Na 132-->138; resolved  Recheck CMP in the AM

## 2024-04-11 NOTE — Progress Notes (Signed)
 Hospital at [facility] Progress Note    Patient: [name] FMW:[id number] DOB: [birth year] DOA: 04/08/2024     3 DOS: the patient was seen and examined on 04/11/2024   Brief hospital course: [name] was admitted to the hospital with the working diagnosis of community acquired pneumonia.   78 yo male with the past medical history of hypertension, dyslipidemia, and prostate cancer, who presented with dyspnea and cough.  Apparently recently diagnosed with pneumonia and treated with antibiotic therapy.  Reported 4 to 5 days of worsening cough, dyspnea on exertion and fatigue.  On his initial physical examination his blood pressure was 1467/77, HR 112, RR 22 and 02 saturation 93%lungs with no wheezing or rhonchi, positive rales left lower lobe, heart with S1 and S2 present and regular with no gallops, rubs or murmurs, abdomen with no distention and no lower extremity edema.   Na 132, K 4,1 Cl 101 bicarbonate 18, glucose 1000 bun 17 cr 1,46  AST 27 ALT 25  Lactic acid 3,0, 2.3, 2.0  Wbc 16.7 hgb 14.5 plt 155  Sars covid 19 negative Influenza negative RSV negative   Chest radiograph with mild cardiomegaly, no vascular congestion, faint interstitial retrocardiac infiltrate.   EKG 112 bpm, normal axis, normal intervals, qtc 406, sinus rhythm with no significant ST segment or T wave changes.   08/12 CT chest with large consolidation left lower lobe, today with persistent cough and wheezing.   At present, pt reports mild clinical improvement thus far. Appetite improved. Still with some SOB. + persistent cough.   Assessment and Plan: * CAP (community acquired pneumonia) Initially had Severe sepsis with lactic acidosis, AKI. (Present on admission)-->now resolving  Still with some SOB and recurring cough  No hypoxia   8/11 CT chest with dense consolidation within the left lower lobe keeping with changes of acute lobar pneumonia. Trace of left para-mnemonic effusion. Diffuse airway inflammation.   Moderate multivessel coronary artery disease.    Plan to continue IV antibiotic therapy with Ceftriaxone , oral azithromycin .  Continue with bronchodilator therapy, incentive spirometer and flutter valve.  Continue oral prednisone    Supplemental O2 prn  Pending Legionella and streptococcal  urinary antigens    Essential hypertension Blood pressure has been stable Continue to hold on carvedilol  for now.  Continue blood pressure monitoring   AKI (acute kidney injury) (HCC) Renal function much improved s/p IVF and po intake (1.46-->1.36-->1.1)  Monitor for now  Minimize nephrotoxic agents      Gout No signs of acute flare, will continue with allopurinol .  Hypothyroidism Continue levothyroxine    Personal history of prostate cancer Follow up as outpatient  Hyperlipidemia Continue with rosuvastatin    Mild aortic stenosis 2D ECHO obtained in setting of ? Ectopy during hospitalization  Noted mild aortic stenosis on imaging-unchanged from prior  Followed by outpatient cardiology  Follow     Hyponatremia Na 132-->138; resolved  Monitor          Subjective: At present, pt reports mild clinical improvement thus far. Appetite improved. Still with some SOB. + persistent cough  After discussion with patient at the bedside, pt is agreeable to the hospital at home program. Plan of care and treatment options also discussed with patient's wife [name] [phone]) over the phone.  Physical Exam: Vitals:   04/11/24 0235 04/11/24 0502 04/11/24 0712 04/11/24 0747  BP:  (!) 167/89  131/72  Pulse:  65  72  Resp:  19  16  Temp:  97.7 F (36.5 C)  97.7  F (36.5 C)  TempSrc:  Oral  Oral  SpO2: 100% 98% 98% 98%  Weight:  113.7 kg    Height:       Physical Exam Constitutional:      Appearance: He is normal weight.  HENT:     Head: Normocephalic and atraumatic.     Nose: Nose normal.  Eyes:     Pupils: Pupils are equal, round, and reactive to light.   Cardiovascular:     Rate and Rhythm: Normal rate and regular rhythm.  Pulmonary:     Effort: Pulmonary effort is normal.     Breath sounds: Rales present.  Abdominal:     General: Bowel sounds are normal.  Musculoskeletal:        General: Normal range of motion.  Skin:    General: Skin is warm.  Neurological:     General: No focal deficit present.  Psychiatric:        Mood and Affect: Mood normal.     Data Reviewed:  There are no new results to review at this time. ECHOCARDIOGRAM COMPLETE    ECHOCARDIOGRAM REPORT       Patient Name:   [name] Date of Exam: 04/10/2024 Medical Rec #:  [id number]    Height:       72.0 in Accession #:    [id number]   Weight:       254.6 lb Date of Birth:  08-Mar-1946     BSA:          2.361 m Patient Age:    78 years     BP:           150/88 mmHg Patient Gender: M            HR:           69 bpm. Exam Location:  Inpatient  Procedure: 2D Echo, Cardiac Doppler and Color Doppler (Both Spectral and Color            Flow Doppler were utilized during procedure).  Indications:    R06.02 SOB   History:        Patient has prior history of Echocardiogram examinations, most                 recent 10/20/2022. Signs/Symptoms:Dyspnea and Shortness of                 Breath; Risk Factors:Hypertension and Dyslipidemia. Pneumonia.   Sonographer:    [name] RDCS Referring Phys: [id number] [name]    Sonographer Comments: Patient is obese. Image acquisition challenging due to patient body habitus. IMPRESSIONS   1. Left ventricular ejection fraction, by estimation, is 60 to 65%. The left ventricle has normal function. The left ventricle has no regional wall motion abnormalities. There is mild concentric left ventricular hypertrophy. Left ventricular diastolic  parameters were normal.  2. Right ventricular systolic function is normal. The right ventricular size is normal. Tricuspid regurgitation signal is inadequate for assessing PA  pressure.  3. The mitral valve is degenerative. Mild mitral valve regurgitation. No evidence of mitral stenosis.  4. The aortic valve is tricuspid. There is moderate calcification of the aortic valve. Aortic valve regurgitation is trivial. Mild aortic valve stenosis. Aortic valve area, by VTI measures 1.91 cm. Aortic valve mean gradient measures 11.5 mmHg. Aortic  valve Vmax measures 2.32 m/s.  5. The inferior vena cava is normal in size with greater than 50% respiratory variability, suggesting right atrial pressure of 3 mmHg.  FINDINGS  Left Ventricle: Left ventricular ejection fraction, by estimation, is 60 to 65%. The left ventricle has normal function. The left ventricle has no regional wall motion abnormalities. The left ventricular internal cavity size was normal in size. There is  mild concentric left ventricular hypertrophy. Left ventricular diastolic parameters were normal.  Right Ventricle: The right ventricular size is normal. No increase in right ventricular wall thickness. Right ventricular systolic function is normal. Tricuspid regurgitation signal is inadequate for assessing PA pressure.  Left Atrium: Left atrial size was normal in size.  Right Atrium: Right atrial size was normal in size.  Pericardium: There is no evidence of pericardial effusion.  Mitral Valve: The mitral valve is degenerative in appearance. Mild mitral annular calcification. Mild mitral valve regurgitation. No evidence of mitral valve stenosis. MV peak gradient, 7.1 mmHg. The mean mitral valve gradient is 3.0 mmHg.  Tricuspid Valve: The tricuspid valve is grossly normal. Tricuspid valve regurgitation is trivial. No evidence of tricuspid stenosis.  Aortic Valve: The aortic valve is tricuspid. There is moderate calcification of the aortic valve. Aortic valve regurgitation is trivial. Mild aortic stenosis is present. Aortic valve mean gradient measures 11.5 mmHg. Aortic valve peak gradient measures  21.4 mmHg.  Aortic valve area, by VTI measures 1.91 cm.  Pulmonic Valve: The pulmonic valve was normal in structure. Pulmonic valve regurgitation is not visualized. No evidence of pulmonic stenosis.  Aorta: The aortic root and ascending aorta are structurally normal, with no evidence of dilitation.  Venous: The right lower pulmonary vein is normal. The inferior vena cava is normal in size with greater than 50% respiratory variability, suggesting right atrial pressure of 3 mmHg.  IAS/Shunts: No atrial level shunt detected by color flow Doppler.    LEFT VENTRICLE PLAX 2D LVIDd:         4.42 cm     Diastology LVIDs:         2.81 cm     LV e' medial:    9.57 cm/s LV PW:         1.10 cm     LV E/e' medial:  13.4 LV IVS:        1.22 cm     LV e' lateral:   9.79 cm/s LVOT diam:     2.30 cm     LV E/e' lateral: 13.1 LV SV:         96 LV SV Index:   41 LVOT Area:     4.15 cm   LV Volumes (MOD) LV vol d, MOD A2C: 89.7 ml LV vol d, MOD A4C: 77.1 ml LV vol s, MOD A2C: 29.2 ml LV vol s, MOD A4C: 31.0 ml LV SV MOD A2C:     60.5 ml LV SV MOD A4C:     77.1 ml LV SV MOD BP:      52.1 ml  RIGHT VENTRICLE            IVC RV S prime:     7.40 cm/s  IVC diam: 1.90 cm TAPSE (M-mode): 1.6 cm  LEFT ATRIUM             Index        RIGHT ATRIUM           Index LA diam:        3.27 cm 1.39 cm/m   RA Area:     16.20 cm LA Vol (A2C):   61.1 ml 25.88 ml/m  RA Volume:   40.00 ml  16.94 ml/m LA Vol (A4C):   15.8 ml 6.69 ml/m LA Biplane Vol: 31.6 ml 13.39 ml/m  AORTIC VALVE AV Area (Vmax):    1.78 cm AV Area (Vmean):   1.76 cm AV Area (VTI):     1.91 cm AV Vmax:           231.50 cm/s AV Vmean:          154.500 cm/s AV VTI:            0.502 m AV Peak Grad:      21.4 mmHg AV Mean Grad:      11.5 mmHg LVOT Vmax:         99.10 cm/s LVOT Vmean:        65.300 cm/s LVOT VTI:          0.231 m LVOT/AV VTI ratio: 0.46   AORTA Ao Root diam: 3.74 cm Ao Asc diam:  3.47 cm  MITRAL VALVE MV Area (PHT): 3.66  cm     SHUNTS MV Area VTI:   2.85 cm     Systemic VTI:  0.23 m MV Peak grad:  7.1 mmHg     Systemic Diam: 2.30 cm MV Mean grad:  3.0 mmHg MV Vmax:       1.33 m/s MV Vmean:      82.0 cm/s MV Decel Time: 208 msec MV E velocity: 128.00 cm/s MV A velocity: 107.00 cm/s MV E/A ratio:  1.20  Darryle Decent MD Electronically signed by Darryle Decent MD Signature Date/Time: 04/10/2024/2:50:08 PM      Final   DG Swallowing Func-Speech Pathology Table formatting from the original result was not included. Modified Barium Swallow Study  Patient Details  Name: [name] MRN: [id number] Date of Birth: [birth year]  Today's Date: 04/10/2024  HPI/PMH: HPI: The pt is a 78 yo male presenting 8/10 with cough and SOB, found to  have sepsis due to CAP. PMH includes: recent PNA resolved after  antibiotics, HTN, hypothyroidism, prostate cancer, UTI, gout, arthritis,  GERD, high cholesterol, and R TKA.  Clinical Impression: Pt has a functional oropharyngeal swallow with good efficiency and no  aspiration observed. He appears to have large osteophytes that contribute  to delayed clearance of the barium tablet from the valleculae, but this  cleared with additional thin liquids boluses. Bolus flow of other  consistencies was not impacted. Pt also had retention of barium in the  esophagus with retrograde flow that stayed below the level of the UES.  Suspect that his greatest risk for aspiration is post-prandial. Education  was reinforced about esophageal precautions. Would continue regular solids  and thin liquids with liquid washes.    Factors that may increase risk of adverse event in presence of aspiration  Noe & Lianne 2021): Factors that may increase risk of adverse event  in presence of aspiration Noe & Lianne 2021): Respiratory or GI  disease  Recommendations/Plan: Swallowing Evaluation Recommendations Swallowing Evaluation Recommendations Recommendations: PO diet PO Diet  Recommendation: Regular; Thin liquids (Level 0) Liquid Administration via: Cup; Straw Medication Administration: Whole meds with liquid Supervision: Patient able to self-feed Swallowing strategies  : Slow rate; Small bites/sips; Follow solids with  liquids Postural changes: Position pt fully upright for meals; Stay upright 30-60  min after meals Oral care recommendations: Oral care BID (2x/day)  Treatment Plan Treatment Plan Treatment recommendations: No treatment recommended at this time Follow-up recommendations: No SLP follow up  Recommendations Recommendations for follow up therapy are one component of a  multi-disciplinary discharge planning process, led by the attending  physician.  Recommendations may be updated based on patient status,  additional functional criteria and insurance authorization.  Assessment: Orofacial Exam: Orofacial Exam Oral Cavity: Oral Hygiene: WFL Oral Cavity - Dentition: Adequate natural dentition Orofacial Anatomy: WFL Oral Motor/Sensory Function: WFL  Anatomy:  Anatomy: Suspected cervical osteophytes  Boluses Administered: Boluses Administered Boluses Administered: Thin liquids (Level 0); Mildly thick liquids (Level  2, nectar thick); Puree; Solid     Oral Impairment Domain: Oral Impairment Domain Lip Closure: No labial escape Tongue control during bolus hold: Cohesive bolus between tongue to palatal  seal Bolus preparation/mastication: Timely and efficient chewing and mashing Bolus transport/lingual motion: Brisk tongue motion Oral residue: Residue collection on oral structures (cleared with  spontaneous second swallow) Location of oral residue : Tongue Initiation of pharyngeal swallow : Pyriform sinuses     Pharyngeal Impairment Domain: Pharyngeal Impairment Domain Soft palate elevation: No bolus between soft palate (SP)/pharyngeal wall  (PW) Laryngeal elevation: Complete superior movement of thyroid  cartilage with  complete  approximation of arytenoids to epiglottic petiole Anterior hyoid excursion: Partial anterior movement Epiglottic movement: Complete inversion Laryngeal vestibule closure: Complete, no air/contrast in laryngeal  vestibule Pharyngeal stripping wave : Present - complete Pharyngeal contraction (A/P view only): N/A Pharyngoesophageal segment opening: Partial distention/partial duration,  partial obstruction of flow Tongue base retraction: No contrast between tongue base and posterior  pharyngeal wall (PPW) Pharyngeal residue: Complete pharyngeal clearance Location of pharyngeal residue: N/A     Esophageal Impairment Domain: Esophageal Impairment Domain Esophageal clearance upright position: Esophageal retention with  retrograde flow below pharyngoesophageal segment (PES)  Pill: Pill Consistency administered: Thin liquids (Level 0) Thin liquids (Level 0): Impaired (see clinical impressions)  Penetration/Aspiration Scale Score: Penetration/Aspiration Scale Score 1.  Material does not enter airway: Puree; Solid; Pill 2.  Material enters airway, remains ABOVE vocal cords then ejected out:  Thin liquids (Level 0); Mildly thick liquids (Level 2, nectar thick)  Compensatory Strategies: Compensatory Strategies Compensatory strategies: Yes Liquid wash: Effective Effective Liquid Wash: Pill      General Information: Caregiver present: No   Diet Prior to this Study: Regular; Thin liquids (Level 0)    Temperature : Normal    Respiratory Status: WFL    Supplemental O2: None (Room air)    History of Recent Intubation: No   Behavior/Cognition: Alert; Cooperative; Pleasant mood  Self-Feeding Abilities: Able to self-feed  Baseline vocal quality/speech: Normal  Volitional Cough: Able to elicit  Volitional Swallow: Able to elicit  Exam Limitations: No limitations  Goal Planning: Prognosis for improved oropharyngeal function: Good  No data recorded No data  recorded Patient/Family Stated Goal: to feel better  Consulted and agree with results and recommendations: Patient  Pain: Pain Assessment Pain Assessment: Faces Faces Pain Scale: 0  End of Session: Start Time:SLP Start Time (ACUTE ONLY): 1157  Stop Time: SLP Stop Time (ACUTE ONLY): 1212  Time Calculation:SLP Time Calculation (min) (ACUTE ONLY): 15 min  Charges: SLP Evaluations $ SLP Speech Visit: 1 Visit  SLP Evaluations $BSS Swallow: 1 Procedure $MBS Swallow: 1 Procedure  SLP visit diagnosis: SLP Visit Diagnosis: Dysphagia, unspecified (R13.10)  Past Medical History:  Past Medical History:  Diagnosis Date   Arthritis    knees. back   Bladder cancer (HCC)    2018   High cholesterol    Hypertension    Hypothyroidism    prostate cancer    2000, 2003   Thyroid  disease    Past Surgical  History:  Past Surgical History:  Procedure Laterality Date   EYE SURGERY Bilateral 2014   HERNIA REPAIR Right 1984   inguinal   IR RADIOLOGIST EVAL & MGMT  03/19/2021   KNEE ARTHROSCOPY Left 1966   LUMBAR LAMINECTOMY/DECOMPRESSION MICRODISCECTOMY N/A 05/13/2022   Procedure: Microlumbar laminectomy Lumbar three-four , Lumbar four-five;   Surgeon: Duwayne Purchase, MD;  Location: [facility] OR;  Service: Orthopedics;   Laterality: N/A;   PENILE PROSTHESIS IMPLANT  2014   TOTAL KNEE ARTHROPLASTY Right 08/11/2020   Procedure: TOTAL KNEE ARTHROPLASTY;  Surgeon: Melodi Lerner, MD;   Location: [facility] ORS;  Service: Orthopedics;  Laterality: Right;    TRANSURETHRAL RESECTION OF BLADDER TUMOR  2018   Leita SAILOR., M.A. CCC-SLP Acute Rehabilitation Services Office: [phone]  Secure chat preferred  04/10/2024, 1:31 PM  Lab Results  Component Value Date   WBC 5.0 04/11/2024   HGB 12.6 (L) 04/11/2024   HCT 37.2 (L) 04/11/2024   MCV 86.7 04/11/2024   PLT 196 04/11/2024   Last metabolic panel Lab Results  Component Value Date   GLUCOSE 129 (H) 04/11/2024   NA 138 04/11/2024   K 3.7  04/11/2024   CL 108 04/11/2024   CO2 21 (L) 04/11/2024   BUN 15 04/11/2024   CREATININE 1.10 04/11/2024   GFRNONAA >60 04/11/2024   CALCIUM  9.8 04/11/2024   PROT 7.2 04/08/2024   ALBUMIN 3.2 (L) 04/08/2024   BILITOT 1.2 04/08/2024   ALKPHOS 72 04/08/2024   AST 27 04/08/2024   ALT 25 04/08/2024   ANIONGAP 9 04/11/2024    Family Communication: Plan of care discussed with patient and family   Disposition: Status is: Inpatient Remains inpatient appropriate because: Continued need for IV antibiotics in setting of dense lobar pneumonia   Planned Discharge Destination: Home    Time spent: >60 minutes  Hospital at Home Admission Criteria Checklist:  Formal consent explained in detail and signed at the bedside:yes Patient meets inpatient admission criteria (see below for further details)  Is pt medicare FFS( required for initial launch with plan to expand)? yes Lives within 25 mil/ 30 min from [facility] within [place](pt may stay with family member during admission who lives within 25 miles or 30 min from [facility] w/in [facility]) ? yes  Hemodynamically stable with relatively low risk of clinical deterioration-not requiring ICU? yes Age >55?yes Does not require frequent touch-points or complex interventions/medications (ie Titrated Infusions (IV insulin, heparin drips, vasoactive drips, use of infused or injectable controlled substances, patients on insulin)? No  Any Behavioral Health comorbidities likely to increase risk for in-home care (ie Acute delirium or experiencing a marked altered mental status and cause is not a treatable condition in the home)? No   Has the patient been on BIPAP during course of ED evaluation or hospitalization?no IF YES, Has the patient been off of BIPAP for >24 hours(If NO-THEN PATIENT DOES MEET INCLUSION CRITERIA)?n/a  Needs oxygen at home (<6L)? no Active safety concerns (ie Unable to use bedside commode independently and lacks caregiver support for  safety- needs SNF placement, unable to obtain IV access)? Performs all of ADLs independently  Has skin check been performed?pending w/ EMS  Has Physical Therapy screened the patient?yes Common admission diagnoses including: CAP, COPD Exacerbation, Acute on chronic heart failure, Cellulitis, UTI , dehydration, acute resp failure with hypoxia (requiring <5L)   Social Screening: Denies significant ETOH intake? yes Does not smoke and understands may not smoke in the presence of oxygen? yes  Patient states  able to use iPad/phone for communication/has family who is able to use? yes  Patient has agreed to be compliant with medication and treatment regimen of the program?yes Any active drug use in patient or primary caregiver including daily dosing of methadone?  no Stable home environment ( access to appropriate heating in cold conditions and/or appropriate air conditioning in hot conditions and/or no running water /electricity)?  yes  No aggressive pets at home? No  Firearm present  with ability or willingness to store them unloaded in a locked case for duration of hospitalization? No   Ambulatory? Yes  (independently cane, walker  wheelchair bound, bed bound) Bed bugs present on home evaluation?no  Family support system in place? yes Patient feels safe at home does not endorse any violence? yes Any actively decompensated behavioral healthy issues including agitation/aggressive behavior? no   Patient requests food to be provided by hospital home program?no PT/OT eval completed and not requiring SNF, ALF, inpatient rehab? No SND/ALF/ rehab needed    To be admitted to the [facility] program, a patient generally must meet the following: 1. Requirement for Inpatient Level of Care: The patient's condition must necessitate an inpatient level of care. This is typically indicated by one or more of the following, depending on their specific diagnosis: -Persistent tachycardia despite appropriate  treatment (e.g., for Heart Failure, UTI). - Persistent tachypnea (rapid breathing) or dyspnea (shortness of breath) that hasn't improved sufficiently with observation care (e.g., for Heart Failure, Pneumonia, Viral Illness, COVID). - Hypoxemia (low oxygen levels), such as a new need for oxygen, an increased need from baseline, or specific oxygen saturation levels (e.g., SpO2 <90-94% depending on the condition) that persist despite observation (e.g., for Heart Failure, COPD, Pneumonia, Viral Illness, COVID). - Need for Intravenous (IV) hydration due to an inability to maintain oral hydration, which persists despite observation care (e.g., for Cellulitis, UTI, Viral Illness, COVID). - Specific to Heart Failure: Persistent pulmonary edema, indicated by a new oxygen need, lack of improvement with IV diuretics, and ongoing tachypnea/dyspnea. - Specific to COPD: A decrease in known baseline resting oxygen saturation (SpO2) by 4% or more, or an increase in pre-existing supplemental oxygen requirements, which persists despite observation and requires continued close monitoring. - Specific to Pneumonia: A Pneumonia Severity Index (PSI) class IV (moderate risk). - Specific to Cellulitis: Failure of outpatient antibiotic therapy (indicated by progression or no improvement after a minimum of 48 hours on an adequate regimen) or a clinical presentation (like acuity or rapidity of progression) that requires the intensity of monitoring found in an inpatient setting. - Specific to UTI: Persistence or worsening of clinical findings like fever, pain, or dehydration despite observation care; presence of significant uropathy; suspected infection of an indwelling prosthetic device, stent, implant, or graft; or pregnancy with suspected pyelonephritis. 2. Appropriateness for Hospital at Home Setting: The patient's overall clinical picture, including the severity of their illness, their care needs, and their  medical history and comorbidities, must be suitable for management in the Hospital at Home environment. This essentially means that none of the exclusion criteria (listed below) are met.  Unified Exclusion Criteria for Hospital at Home Admission: A patient would not be eligible for Hospital at Home if any of the following are present: - Hemodynamic Instability: - Hypotension (low blood pressure) is present. - Respiratory Instability or Needs Beyond Program Capability: - There is a new need for invasive or noninvasive ventilatory assistance (like BiPAP or a ventilator). - Oxygenation is not sufficient, generally indicated if  an FiO2 (fraction of inspired oxygen) of 45% (which is about 6 Liters/minute via nasal cannula) or more is required to keep oxygen saturation (SpO2) at 90% or greater. - Monitoring or Procedural Needs Beyond Program Capability: - There is a need for invasive monitoring, such as a pulmonary artery catheter or an arterial line. - There is a need for immediate-response telemetry monitoring (for dangerous arrhythmia detection and subsequent immediate intervention). - The required medication regimen is beyond the capabilities of [facility] (e.g., dosing intervals are too frequent for home administration). - There is a need for a procedure that cannot be performed by the Hospital at [facility] team (e.g., significant wound debridement or abscess drainage for cellulitis, or percutaneous nephrostomy for a complicated UTI). - Significant Organ Dysfunction or Markers of Severe Illness: - Mental status is not at baseline, or there is altered mental status suggestive of inadequate perfusion. - Renal (kidney) function is unstable or showing an ongoing decline. - There is evidence of inadequate perfusion, such as metabolic acidosis or myocardial ischemia. - Uncompensated acidosis is present. - Condition-Specific Severity or Complications Making Home Care Unsuitable: -For Heart  Failure: Known severe cardiac valvular disease (e.g., aortic stenosis, mitral regurgitation); or severe peripheral edema that impairs the ability to urinate or ambulate. -For COPD: Known concurrent comorbidity or finding that indicates a higher-risk COPD exacerbation (e.g., pulmonary fibrosis, cavitation, pleural effusion, pneumothorax, rib fracture). -For Pneumonia: Pneumonia Severity Index (PSI) class V (indicating high risk for inpatient mortality); known concurrent comorbidity or finding that indicates higher-risk pneumonia (e.g., pulmonary fibrosis, cavitation, large or loculated pleural effusion); or a concomitant serious infectious process like endocarditis or empyema. -For Cellulitis: Orbital, periorbital, or necrotizing infection is suspected; or a concomitant serious infectious process like endocarditis, septic emboli, or septic joint space infection. -For UTI: Urinary tract obstruction (e.g., kidney stone, bladder outlet obstruction); or a concomitant serious infectious process like endocarditis or septic emboli. -For Viral Illness & COVID-19: A concomitant serious infectious process like endocarditis or empyema. General Comorbidities or Status: -The patient is significantly immunosuppressed (this applies to Pneumonia, Cellulitis, UTI, Viral Illness, and COVID-19). -The patient meets inpatient admission criteria for a second diagnosis, or has care needs beyond the capabilities of [facility] due to an active clinically significant comorbidity. (This is a general exclusion across all listed conditions)  Author: Elspeth JINNY Masters, MD 04/11/2024 8:54 AM  For on call review [email or web address].

## 2024-04-11 NOTE — Progress Notes (Signed)
 Arrived at Pt's home for routine [facility] visit. Pt appears well and is on couch with feet elevated. Pt's wife and daughter present. Pt's wife states pt took a nap for about 30 minutes.   Pt denies SOB, pain or any other complaints.  Vital signs obtained as noted in flowsheets.   Swelling to LE's reduced as noted under flowsheet assessment.   Pt has no medications due at this time but was informed he has a few due later. Pt requests virtual RN to call him to administer same rather than initiating call himself.   Pt states he usually goes to bed around 11pm or midnight but understands he has meds due at 8pm and 10 pm.   Pt and wife shown how to use nebulizer machine at admission visit and they feel comfortable using same later with virtual RN. I/S use reinforced as well as calling the hub or using tablet to call.

## 2024-04-11 NOTE — Progress Notes (Signed)
 Patient transferred to the [facility] program. Communicated with patient via video tablet. AAOx4. Denies SOB at rest. Room air. No complaints of pain or discomfort at this time. Plan of care reviewed with patient and spouse. Patient was told that the virtual nurse is available 24/7. [facility] phone number given to patient. Tablet usage explained. Medication reconciliation and skin check completed with the paramedics, Lauraine and Norman. Patient agreeable with plan of care.

## 2024-04-11 NOTE — Plan of Care (Signed)
  Problem: Activity: Goal: Ability to tolerate increased activity will improve Outcome: Progressing   Problem: Clinical Measurements: Goal: Ability to maintain a body temperature in the normal range will improve Outcome: Progressing   Problem: Respiratory: Goal: Ability to maintain adequate ventilation will improve Outcome: Progressing Goal: Ability to maintain a clear airway will improve Outcome: Progressing   

## 2024-04-11 NOTE — Progress Notes (Signed)
 Patient transferred to the [facility] at [facility] program. Communicated with patient via video tablet. AAOx4. Denies SOB at rest. Room air. No complaints of pain or discomfort at this time. Plan of care reviewed with patient and spouse. Patient was told that the virtual nurse is available 24/7. [facility] phone number given to patient. Tablet usage explained. Medication reconciliation and skin check completed with the paramedics, Lauraine and Norman. Patient agreeable with plan of care.

## 2024-04-11 NOTE — Progress Notes (Signed)
 BP 130s-170s/90s  Will restart low dose coreg   Monitor

## 2024-04-12 ENCOUNTER — Encounter (HOSPITAL_COMMUNITY): Payer: Self-pay | Admitting: Internal Medicine

## 2024-04-12 DIAGNOSIS — I160 Hypertensive urgency: Secondary | ICD-10-CM

## 2024-04-12 DIAGNOSIS — R059 Cough, unspecified: Secondary | ICD-10-CM

## 2024-04-12 DIAGNOSIS — K219 Gastro-esophageal reflux disease without esophagitis: Secondary | ICD-10-CM

## 2024-04-12 DIAGNOSIS — R001 Bradycardia, unspecified: Secondary | ICD-10-CM

## 2024-04-12 LAB — COMPREHENSIVE METABOLIC PANEL WITH GFR
ALT: 26 U/L (ref 0–44)
AST: 44 U/L — ABNORMAL HIGH (ref 15–41)
Albumin: 2.8 g/dL — ABNORMAL LOW (ref 3.5–5.0)
Alkaline Phosphatase: 59 U/L (ref 38–126)
Anion gap: 10 (ref 5–15)
BUN: 22 mg/dL (ref 8–23)
CO2: 21 mmol/L — ABNORMAL LOW (ref 22–32)
Calcium: 9.7 mg/dL (ref 8.9–10.3)
Chloride: 107 mmol/L (ref 98–111)
Creatinine, Ser: 1.03 mg/dL (ref 0.61–1.24)
GFR, Estimated: >60 mL/min (ref >60)
Glucose, Bld: 107 mg/dL — ABNORMAL HIGH (ref 70–99)
Potassium: 4 mmol/L (ref 3.5–5.1)
Sodium: 138 mmol/L (ref 135–145)
Total Bilirubin: 0.7 mg/dL (ref 0.0–1.2)
Total Protein: 6.3 g/dL — ABNORMAL LOW (ref 6.5–8.1)

## 2024-04-12 LAB — CBC
HCT: 37.9 % — ABNORMAL LOW (ref 39.0–52.0)
Hemoglobin: 12.8 g/dL — ABNORMAL LOW (ref 13.0–17.0)
MCH: 29.6 pg (ref 26.0–34.0)
MCHC: 33.8 g/dL (ref 30.0–36.0)
MCV: 87.7 fL (ref 80.0–100.0)
Platelets: 221 K/uL (ref 150–400)
RBC: 4.32 MIL/uL (ref 4.22–5.81)
RDW: 16.2 % — ABNORMAL HIGH (ref 11.5–15.5)
WBC: 5.7 K/uL (ref 4.0–10.5)
nRBC: 0 % (ref 0.0–0.2)

## 2024-04-12 MED ORDER — CARVEDILOL 6.25 MG PO TABS
6.2500 mg | ORAL_TABLET | Freq: Two times a day (BID) | ORAL | Status: DC
  Administered 2024-04-12 – 2024-04-13 (×3): 6.25 mg via ORAL
  Filled 2024-04-12 (×11): qty 1

## 2024-04-12 MED ORDER — HYDROCHLOROTHIAZIDE 12.5 MG PO TABS: 6.2500 mg | ORAL_TABLET | Freq: Every day | ORAL | Status: DC

## 2024-04-12 MED ORDER — HYDROCOD POLI-CHLORPHE POLI ER 10-8 MG/5ML PO SUER: 5.0000 mL | Freq: Two times a day (BID) | ORAL | 0 refills | Status: AC | PRN

## 2024-04-12 MED ORDER — PHENOL 1.4 % MT LIQD
1.0000 | OROMUCOSAL | Status: DC | PRN
  Filled 2024-04-12: qty 177

## 2024-04-12 MED ORDER — HYDROCOD POLI-CHLORPHE POLI ER 10-8 MG/5ML PO SUER
5.0000 mL | Freq: Two times a day (BID) | ORAL | Status: AC | PRN
  Filled 2024-04-12 (×5): qty 5

## 2024-04-12 MED ORDER — HYDROCHLOROTHIAZIDE 12.5 MG PO TABS
6.2500 mg | ORAL_TABLET | Freq: Every day | ORAL | Status: DC
  Administered 2024-04-12: 6.25 mg via ORAL
  Filled 2024-04-12 (×3): qty 1
  Filled 2024-04-12: qty 0.5
  Filled 2024-04-12: qty 1

## 2024-04-12 MED ORDER — HYDROCOD POLI-CHLORPHE POLI ER 10-8 MG/5ML PO SUER
5.0000 mL | Freq: Every day | ORAL | Status: DC
  Filled 2024-04-12: qty 5

## 2024-04-12 MED ORDER — DILTIAZEM HCL 25 MG/5ML IV SOLN
10.0000 mg | Freq: Once | INTRAVENOUS | Status: AC
  Administered 2024-04-12: 10 mg via INTRAVENOUS
  Filled 2024-04-12: qty 5

## 2024-04-12 MED ORDER — HYDRALAZINE HCL 20 MG/ML IJ SOLN
10.0000 mg | Freq: Four times a day (QID) | INTRAMUSCULAR | Status: DC | PRN
  Administered 2024-04-12: 10 mg via INTRAVENOUS
  Filled 2024-04-12 (×6): qty 0.5

## 2024-04-12 MED ORDER — HYDROCOD POLI-CHLORPHE POLI ER 10-8 MG/5ML PO SUER: 5.0000 mL | Freq: Every day | ORAL | Status: DC

## 2024-04-12 MED ORDER — HYDROCOD POLI-CHLORPHE POLI ER 10-8 MG/5ML PO SUER: 5.0000 mL | Freq: Every day | ORAL | 0 refills | Status: DC

## 2024-04-12 MED ORDER — HYDRALAZINE HCL 20 MG/ML IJ SOLN: 5.0000 mg | INTRAMUSCULAR | Status: DC | PRN

## 2024-04-12 MED ORDER — HYDROCOD POLI-CHLORPHE POLI ER 10-8 MG/5ML PO SUER: 5.0000 mL | Freq: Two times a day (BID) | ORAL | 0 refills | Status: DC | PRN

## 2024-04-12 NOTE — Progress Notes (Signed)
 1500 virtual meeting patient identifiers completed Duoneb Tx well tolerated. Sent virtual link for PT eval. PT believes patient is doing well at baseline and suggested silver sneakers program to which the patient agreed and would be going with a neighbor that attends. Medic informed addition Diuretic was ordered and to continue monitoring bradycardia.

## 2024-04-12 NOTE — Progress Notes (Signed)
 Contact made with patient via phone to introduce myself as the night RN. Patient found doing well, with no complaints or pain. RN assisted patient with repositioning his arm band, as no info was coming through on current health. Vitals now showing and plan made for a medic visit and RN call for bedtime medications a little later this evening.

## 2024-04-12 NOTE — Progress Notes (Signed)
 Physical Therapy Note  Virtual follow-up completed at 1523. Patient states he has been mobilizing independently and frequently throughout the home without an assistive device. Nurse confirms. States the little bit of instability he had while being evaluated by our team in the hospital has completely resolved. Minimal SOB. States he has been mobilizing under the supervision of the EMT/Paramedics without incident or concerns. He is looking into silver sneakers program which he plans to join after d/c. No acute needs identified during this visit. No charge. He is aware he can reach out to the [facility]  team for any questions regarding mobility/function and we will touch base with him. Will continue to follow for any changes in status.    Leontine Roads, PT, DPT [facility] Physical Therapist Office: [phone] Website: [email or web address]

## 2024-04-12 NOTE — Assessment & Plan Note (Addendum)
 Secondary to CAP, LLL Continue treatment with antibiotics per above, IS and flutter q4h while awake, duoneb therapy as prescribed Symptomatic support: Robitussin DM 100-10 mlg q4h prn for cough during the day, tussionex 10-8 mg/5ml BID prn for cough, 2 days ordered; phenol mouth spray, 1 spray as needed for throat irritation/pain

## 2024-04-12 NOTE — Significant Event (Addendum)
 [facility] - Significant Event Note  At approximately 1735: I was alerted by [facility]  RN that patient's BP is 195/95 with HR 51 and recheck BP is 203/91. I advised for patient to take ordered coreg  6.25 mg and hydrochlorothiazide  6.25 mg PO as ordered.   At approximately 18:05: Recheck BP 196/94. Patient reportedly remained stable.   Video conference call with myself at approximately 18: 14: Patient AAO x 3. He denies headache, vision changes, chest pain, shortness of breath, changes to swelling of lower extremities.   # Hypertensive urgency, asymptomatic - multifactorial in setting of LLL pna, decreased dose of coreg  due to bradycardia, and duoneb use. - Patient mentation was reassuring, continue monitoring - Hydralazine  10 mg IV q6h prn for sbp > 175 mmHg, 3 days ordered - Diltiazem  10 mg IV once given - Repeat vitals approximately 5-10 minutes after diltiazem : T 97.4, RR 14, spO2 96%, HR 56, BP 159/82. - Continue monitoring and treatment with IV abx, scheduled duonebs, and prednisone  PO  Dr. Sherre            CRITICAL CARE Performed by: Dr. Sherre  Total critical care time: 32 minutes  Critical care time was exclusive of separately billable procedures and treating other patients.  Critical care was necessary to treat or prevent imminent or life-threatening deterioration.  Critical care was time spent personally by me on the following activities: development of treatment plan with patient, as well as nursing, discussions with consultants, evaluation of patient's response to treatment, examination of patient, obtaining history from patient or surrogate, ordering and performing treatments and interventions, ordering and review of laboratory studies, ordering and review of radiographic studies, pulse oximetry and re-evaluation of patient's condition.

## 2024-04-12 NOTE — Progress Notes (Addendum)
 Patient was able to self administer bedtime medications without issue. He continues to deny pain or any current needs. Patient performed flutter valve x5. Cough noted after flutter valve use but was not productive. RN encouraged patient to call via phone or tablet if needed.  Medic arrived to patient's home around 21:40. Manual recheck of patient's blood pressure was 174/80. MD advised to give prn dose of IV hydralazine . Initial follow-up blood pressure was 161/77. Patient continues to deny any symptoms with his elevated blood pressure. Providers made aware.

## 2024-04-12 NOTE — Assessment & Plan Note (Signed)
 Home aspirin  81 mg daily, rosuvastatin  10 mg daily, coreg  6.25 mg bid continued

## 2024-04-12 NOTE — Progress Notes (Signed)
 Virtual call [phone] patient identifiers complete. Patient was given morning medications education related to taking synthroid , steroids, and his duo neb. C/O cough keeping him up sometimes at night. Contacted  provider patient was ordered flutter valve, tussinex for night cough.

## 2024-04-12 NOTE — Progress Notes (Addendum)
 [facility] PROGRESS NOTE    [name]   FMW:[id number]  DOB: Jul 01, 1946  PCP: [name] [name], MD    DOA: 04/08/2024 LOS: 4   Brief Narrative   [name] was admitted to the hospital with the working diagnosis of community acquired pneumonia.   78 yo male with the past medical history of hypertension, dyslipidemia, and prostate cancer, who presented with dyspnea and cough.  Apparently recently diagnosed with pneumonia and treated with antibiotic therapy.  Reported 4 to 5 days of worsening cough, dyspnea on exertion and fatigue.  On his initial physical examination his blood pressure was 146/77, HR 112, RR 22 and 02 saturation 93%lungs with no wheezing or rhonchi, positive rales left lower lobe, heart with S1 and S2 present and regular with no gallops, rubs or murmurs, abdomen with no distention and no lower extremity edema.   Na 132, K 4,1 Cl 101 bicarbonate 18, glucose 1000 bun 17 cr 1,46  AST 27 ALT 25  Lactic acid 3,0, 2.3, 2.0  Wbc 16.7 hgb 14.5 plt 155  Sars covid 19 negative Influenza negative RSV negative   Chest radiograph with mild cardiomegaly, no vascular congestion, faint interstitial retrocardiac infiltrate.   EKG 112 bpm, normal axis, normal intervals, qtc 406, sinus rhythm with no significant ST segment or T wave changes.   08/12 CT chest with large consolidation left lower lobe, today with persistent cough and wheezing.   8/14 H@H  progress note: patient had bradycardia at approximately 3am, noted by team in the 30s-40s. Patient not in acute distress. Already reduced Coreg  changed rom 12.5 mg BID to 6.25 mg BID (home dosing is 25 mg BID). Patient reporting cough that keeps him up at night, requesting medications. Tussionex BID prn for cough ordered, 2 days.   Assessment & Plan   Principal Problem:   CAP (community acquired pneumonia) Active Problems:   AKI (acute kidney injury) (HCC)   Essential hypertension   Dyspnea on exertion   Hyponatremia    Mild aortic stenosis   Cough   Bradycardia   Gout   Personal history of prostate cancer   Hypothyroidism   Hyperlipidemia   GERD (gastroesophageal reflux disease)   Aortic atherosclerosis (HCC)   Anxiety and depression   Assessment and Plan:  * CAP (community acquired pneumonia) Initially meeting severe sepsis criteria on admission, with lactic acidosis, AKI - resolved  Still with endorsing SOB and recurring cough, wheezing No hypoxia  8/11 CT chest with dense consolidation in the left lower lobe keeping with changes of acute lobar pneumonia. Trace of left para-mnemonic effusion. Diffuse airway inflammation. Moderate multivessel coronary artery disease.    Plan: continue IV antibiotic therapy with Ceftriaxone  1g IV daily, azithromycin  500 mg daily PO, to complete 5 day course.  Continue with duoneb q6h scheduled, incentive spirometer and flutter valve.  Continue oral prednisone  40 mg daily with breakfast Supplemental O2 prn  Pending Legionella and streptococcal  urinary antigens - discuss with H@H  team, to collect urine sample and titrate/change abx as appropriate  AKI (acute kidney injury) (HCC) Renal function much improved s/p IVF and po intake (1.46-->1.36-->1.1)  Baseline renal function over the last year: 0.96/1.10 Minimize nephrotoxic agents  Recheck CMP in the AM  Bradycardia Home carvedilol  25 mg BID was reduced to 12.5 mg BID Carvedilol  12.5 mg BID reduced to 6.25 mg BID in setting of bradycardia to the 30-40s overnight Continue monitoring as appropriate  Cough Secondary to CAP, LLL Continue treatment with antibiotics per above,  IS and flutter q4h while awake, duoneb therapy as prescribed Symptomatic support: Robitussin DM 100-10 mlg q4h prn for cough during the day, tussionex 10-8 mg/5ml BID prn for cough, 2 days ordered; phenol mouth spray, 1 spray as needed for throat irritation/pain  Mild aortic stenosis Echo 04/10/24: est EF 60-65%, LV diastolic function was  read normal. RVS function and size were read as normal.  Noted mild aortic stenosis on imaging-unchanged from prior  Followed by outpatient cardiology  Hyponatremia Na 132-->138; resolved  Recheck CMP in the AM  Dyspnea on exertion Secondary to CAP, continue treatment per above  Essential hypertension Continue home carvedilol  dosing at 6.25 mg BID due to noted bradycardia Continue blood pressure monitoring   Addendum 04/12/2024 at 12:00: BP documented by EMT is 168/81. Given bradycardia, coreg  will remain 6.25 mg BID. In setting long term lower extremity edema noted by patient, we will initiated hydrochlorothiazide  6.25 mg daily staring at 1700 on 04/12/24. Continue to monitor CMP. RN informed.  GERD (gastroesophageal reflux disease) Pantoprazole  40 mg daily  Hyperlipidemia Continue home rosuvastatin  10 mg daily  Hypothyroidism Home levothyroxine  88 mcg daily continued  Personal history of prostate cancer Follow up as outpatient  Gout No signs of acute flare, will continue home allopurinol  100 mg daily  Anxiety and depression Home citalopram  20 mg daily continued  Aortic atherosclerosis (HCC) Home aspirin  81 mg daily, rosuvastatin  10 mg daily, coreg  6.25 mg bid continued  Patient BMI: Body mass index is 34.46 kg/m.   Chart reviewed: Complete echo on 04/10/24:estimated EF 60-65%, left ventricle has no regional wall motion abnormalities. Mild concentric left ventricular hypertrophy. Left ventricular diastolic parameters were normal. Right ventricular systolic function is normal. Right ventricular size is normal. Tricuspid regurgitation signal is inadequate for assessing PA pressure. Mitral valve is degenerative. Mild mitral valve regurgitation. No evidence of mitral stenosis. Aortic valve is tricuspid and moderate calcification of the aortic valve. Mild aortic valve stenosis, with area measuring 1.91 cm2, aortic valve mean gradient measures 11.5 mmHg.   DVT prophylaxis: ted  hose; ambulation Diet: regular diet  Diet Orders (From admission, onward)     Start     Ordered   04/10/24 1438  Diet regular Fluid consistency: Thin  Diet effective now       Question:  Fluid consistency:  Answer:  Thin   04/10/24 1437              Code Status: Full Code    Subjective 04/12/24    On video patient call, patient does not appear to be in acute distress. He reports improving dyspnea and cough. He endorses wheezing and cough that is worse at night. He states the wheezing has been ongoing for many monts and that he was previously treated with albuterol  inhaler and steroids and the wheezing months ago improved significantly. He denies fever, chills, nausea, vomiting, chest pain, abdominal pain, dysuria, hematuria, diarrhea, blood in his stool, changes to swelling of the lower extremities, SI, HI.   Disposition Plan & Communication   Dispo & Barriers: IV abx treatment Coming from: inpatient medical ward Exp d/c date: 04/14/2024 Medically stable for d/c: no  Family Communication: updated spouse, Mrs. Limones in the home.   Consults, Procedures, Significant Events   Consultants:  Pt, ot, pharmacy  Procedures:  PIV  Antimicrobials:  Ceftriaxone  1 g IV daily; azithromycin  500 mg PO daily   Significant Events: Approximately 3am on 04/12/2024: Bradycardia event, HR dropping below 55 (running 30s-40s).  Objective  Vitals:   04/12/24 0305 04/12/24 0312 04/12/24 0340 04/12/24 1054  BP:    (!) 168/81  Pulse: (!) 57 60 63 (!) 59  Resp: 15 14 13 16   Temp:    (!) 97.5 F (36.4 C)  TempSrc:    Oral  SpO2: 96% 94% 97%   Weight:      Height:       No intake or output data in the 24 hours ending 04/12/24 1226  Filed Weights   04/10/24 0344 04/11/24 0502 04/11/24 1357  Weight: 115.5 kg 113.7 kg 115.3 kg   Physical Exam via EMT, Victoria Huffman:  RR 18, HR 59, BP 162/82 and spO2 is 96% on RA, afebrile.  General exam: awake, alert, no acute distress HEENT:  atraumatic, clear conjunctiva, anicteric sclera, moist mucus membranes, hearing grossly normal  Respiratory system: Left upper quadrant expiratory wheezing noted. No rales or rhonchi, normal respiratory effort. Cardiovascular system: normal S1/S2, RRR  Gastrointestinal system: soft, NT, ND, no HSM felt, +bowel sounds. Central nervous system: A&O x self, age, current calendar year and month. no gross focal neurologic deficits, normal speech Extremities: moves all upper extremities, no edema, normal tone Skin: dry, intact, normal temperature, normal color, No rashes, lesions or ulcers Psychiatry: normal mood, congruent affect, judgement and insight appear normal  Labs    Data Reviewed: I have personally reviewed following labs and imaging studies  CBC: Recent Labs  Lab 04/08/24 1531 04/09/24 0257 04/10/24 0221 04/11/24 0221  WBC 16.7* 13.2* 13.1* 5.0  NEUTROABS 15.0* 12.0* 10.7*  --   HGB 14.5 12.4* 11.9* 12.6*  HCT 44.1 36.9* 35.7* 37.2*  MCV 88.9 87.9 88.8 86.7  PLT 155 138* 149* 196   Basic Metabolic Panel: Recent Labs  Lab 04/08/24 1531 04/09/24 0257 04/09/24 1134 04/10/24 0221 04/11/24 0221  NA 132* 133*  --  137 138  K 4.1 3.8  --  3.9 3.7  CL 101 102  --  106 108  CO2 18* 21*  --  22 21*  GLUCOSE 100* 87  --  93 129*  BUN 17 16  --  15 15  CREATININE 1.46* 1.34*  --  1.17 1.10  CALCIUM  9.8 8.9  --  9.7 9.8  MG  --   --  1.7 2.2  --    GFR: Estimated Creatinine Clearance: 72.6 mL/min (by C-G formula based on SCr of 1.1 mg/dL).  Liver Function Tests: Recent Labs  Lab 04/08/24 1531  AST 27  ALT 25  ALKPHOS 72  BILITOT 1.2  PROT 7.2  ALBUMIN 3.2*   BNP (last 3 results) Recent Labs    01/02/24 1419  PROBNP 53.0   Sepsis Labs: Recent Labs  Lab 04/08/24 1736 04/08/24 1748 04/09/24 0257 04/09/24 1134 04/09/24 1354  PROCALCITON 8.30  --   --   --   --   LATICACIDVEN  --  2.3* 2.0* 1.5 1.4   Recent Results (from the past 240 hours)  Resp panel  by RT-PCR (RSV, Flu A&B, Covid) Anterior Nasal Swab     Status: None   Collection Time: 04/08/24  3:32 PM   Specimen: Anterior Nasal Swab  Result Value Ref Range Status   SARS Coronavirus 2 by RT PCR NEGATIVE NEGATIVE Final   Influenza A by PCR NEGATIVE NEGATIVE Final   Influenza B by PCR NEGATIVE NEGATIVE Final    Comment: (NOTE) The Xpert Xpress SARS-CoV-2/FLU/RSV plus assay is intended as an aid in the diagnosis of influenza from Nasopharyngeal swab  specimens and should not be used as a sole basis for treatment. Nasal washings and aspirates are unacceptable for Xpert Xpress SARS-CoV-2/FLU/RSV testing.  Fact Sheet for Patients: BloggerCourse.com  Fact Sheet for Healthcare Providers: SeriousBroker.it  This test is not yet approved or cleared by the [place]  FDA and has been authorized for detection and/or diagnosis of SARS-CoV-2 by FDA under an Emergency Use Authorization (EUA). This EUA will remain in effect (meaning this test can be used) for the duration of the COVID-19 declaration under Section 564(b)(1) of the Act, 21 U.S.C. section 360bbb-3(b)(1), unless the authorization is terminated or revoked.     Resp Syncytial Virus by PCR NEGATIVE NEGATIVE Final    Comment: (NOTE) Fact Sheet for Patients: BloggerCourse.com  Fact Sheet for Healthcare Providers: SeriousBroker.it  This test is not yet approved or cleared by the [place]  FDA and has been authorized for detection and/or diagnosis of SARS-CoV-2 by FDA under an Emergency Use Authorization (EUA). This EUA will remain in effect (meaning this test can be used) for the duration of the COVID-19 declaration under Section 564(b)(1) of the Act, 21 U.S.C. section 360bbb-3(b)(1), unless the authorization is terminated or revoked.  Performed at [facility], [address], [place], [place] [address]   Blood  culture (routine x 2)     Status: None (Preliminary result)   Collection Time: 04/08/24  5:36 PM   Specimen: BLOOD  Result Value Ref Range Status   Specimen Description BLOOD RIGHT ANTECUBITAL  Final   Special Requests   Final    BOTTLES DRAWN AEROBIC AND ANAEROBIC Blood Culture adequate volume   Culture   Final    NO GROWTH 4 DAYS Performed at [facility], [address], [place], [place] [address]    Report Status PENDING  Incomplete  Blood culture (routine x 2)     Status: None (Preliminary result)   Collection Time: 04/08/24  5:41 PM   Specimen: BLOOD  Result Value Ref Range Status   Specimen Description BLOOD BLOOD RIGHT HAND  Final   Special Requests   Final    AEROBIC BOTTLE ONLY Blood Culture results may not be optimal due to an inadequate volume of blood received in culture bottles   Culture   Final    NO GROWTH 4 DAYS Performed at [facility], [address], [place], [place] [address]    Report Status PENDING  Incomplete  MRSA Next Gen by PCR, Nasal     Status: None   Collection Time: 04/08/24 10:00 PM   Specimen: Nasal Mucosa; Nasal Swab  Result Value Ref Range Status   MRSA by PCR Next Gen NOT DETECTED NOT DETECTED Final    Comment: (NOTE) The GeneXpert MRSA Assay (FDA approved for NASAL specimens only), is one component of a comprehensive MRSA colonization surveillance program. It is not intended to diagnose MRSA infection nor to guide or monitor treatment for MRSA infections. Test performance is not FDA approved in patients less than 33 years old. Performed at [facility], [address], [place], [place] [address]     Imaging Studies   ECHOCARDIOGRAM COMPLETE Result Date: 04/10/2024    ECHOCARDIOGRAM REPORT   Patient Name:   [name] Date of Exam: 04/10/2024 Medical Rec #:  [id number]    Height:       72.0 in Accession #:    [id number]   Weight:       254.6 lb Date of Birth:  1946/08/17  BSA:          2.361 m Patient Age:    78 years      BP:           150/88 mmHg Patient Gender: M            HR:           69 bpm. Exam Location:  Inpatient Procedure: 2D Echo, Cardiac Doppler and Color Doppler (Both Spectral and Color            Flow Doppler were utilized during procedure). Indications:    R06.02 SOB  History:        Patient has prior history of Echocardiogram examinations, most                 recent 10/20/2022. Signs/Symptoms:Dyspnea and Shortness of                 Breath; Risk Factors:Hypertension and Dyslipidemia. Pneumonia.  Sonographer:    Ellouise Mose RDCS Referring Phys: [id number] [facility] ARRIEN  Sonographer Comments: Patient is obese. Image acquisition challenging due to patient body habitus. IMPRESSIONS  1. Left ventricular ejection fraction, by estimation, is 60 to 65%. The left ventricle has normal function. The left ventricle has no regional wall motion abnormalities. There is mild concentric left ventricular hypertrophy. Left ventricular diastolic parameters were normal.  2. Right ventricular systolic function is normal. The right ventricular size is normal. Tricuspid regurgitation signal is inadequate for assessing PA pressure.  3. The mitral valve is degenerative. Mild mitral valve regurgitation. No evidence of mitral stenosis.  4. The aortic valve is tricuspid. There is moderate calcification of the aortic valve. Aortic valve regurgitation is trivial. Mild aortic valve stenosis. Aortic valve area, by VTI measures 1.91 cm. Aortic valve mean gradient measures 11.5 mmHg. Aortic valve Vmax measures 2.32 m/s.  5. The inferior vena cava is normal in size with greater than 50% respiratory variability, suggesting right atrial pressure of 3 mmHg. FINDINGS  Left Ventricle: Left ventricular ejection fraction, by estimation, is 60 to 65%. The left ventricle has normal function. The left ventricle has no regional wall motion abnormalities. The left ventricular internal cavity size was normal in size. There is  mild concentric left  ventricular hypertrophy. Left ventricular diastolic parameters were normal. Right Ventricle: The right ventricular size is normal. No increase in right ventricular wall thickness. Right ventricular systolic function is normal. Tricuspid regurgitation signal is inadequate for assessing PA pressure. Left Atrium: Left atrial size was normal in size. Right Atrium: Right atrial size was normal in size. Pericardium: There is no evidence of pericardial effusion. Mitral Valve: The mitral valve is degenerative in appearance. Mild mitral annular calcification. Mild mitral valve regurgitation. No evidence of mitral valve stenosis. MV peak gradient, 7.1 mmHg. The mean mitral valve gradient is 3.0 mmHg. Tricuspid Valve: The tricuspid valve is grossly normal. Tricuspid valve regurgitation is trivial. No evidence of tricuspid stenosis. Aortic Valve: The aortic valve is tricuspid. There is moderate calcification of the aortic valve. Aortic valve regurgitation is trivial. Mild aortic stenosis is present. Aortic valve mean gradient measures 11.5 mmHg. Aortic valve peak gradient measures 21.4 mmHg. Aortic valve area, by VTI measures 1.91 cm. Pulmonic Valve: The pulmonic valve was normal in structure. Pulmonic valve regurgitation is not visualized. No evidence of pulmonic stenosis. Aorta: The aortic root and ascending aorta are structurally normal, with no evidence of dilitation. Venous: The right lower pulmonary vein is normal. The inferior vena cava is normal  in size with greater than 50% respiratory variability, suggesting right atrial pressure of 3 mmHg. IAS/Shunts: No atrial level shunt detected by color flow Doppler.  LEFT VENTRICLE PLAX 2D LVIDd:         4.42 cm     Diastology LVIDs:         2.81 cm     LV e' medial:    9.57 cm/s LV PW:         1.10 cm     LV E/e' medial:  13.4 LV IVS:        1.22 cm     LV e' lateral:   9.79 cm/s LVOT diam:     2.30 cm     LV E/e' lateral: 13.1 LV SV:         96 LV SV Index:   41 LVOT Area:      4.15 cm  LV Volumes (MOD) LV vol d, MOD A2C: 89.7 ml LV vol d, MOD A4C: 77.1 ml LV vol s, MOD A2C: 29.2 ml LV vol s, MOD A4C: 31.0 ml LV SV MOD A2C:     60.5 ml LV SV MOD A4C:     77.1 ml LV SV MOD BP:      52.1 ml RIGHT VENTRICLE            IVC RV S prime:     7.40 cm/s  IVC diam: 1.90 cm TAPSE (M-mode): 1.6 cm LEFT ATRIUM             Index        RIGHT ATRIUM           Index LA diam:        3.27 cm 1.39 cm/m   RA Area:     16.20 cm LA Vol (A2C):   61.1 ml 25.88 ml/m  RA Volume:   40.00 ml  16.94 ml/m LA Vol (A4C):   15.8 ml 6.69 ml/m LA Biplane Vol: 31.6 ml 13.39 ml/m  AORTIC VALVE AV Area (Vmax):    1.78 cm AV Area (Vmean):   1.76 cm AV Area (VTI):     1.91 cm AV Vmax:           231.50 cm/s AV Vmean:          154.500 cm/s AV VTI:            0.502 m AV Peak Grad:      21.4 mmHg AV Mean Grad:      11.5 mmHg LVOT Vmax:         99.10 cm/s LVOT Vmean:        65.300 cm/s LVOT VTI:          0.231 m LVOT/AV VTI ratio: 0.46  AORTA Ao Root diam: 3.74 cm Ao Asc diam:  3.47 cm MITRAL VALVE MV Area (PHT): 3.66 cm     SHUNTS MV Area VTI:   2.85 cm     Systemic VTI:  0.23 m MV Peak grad:  7.1 mmHg     Systemic Diam: 2.30 cm MV Mean grad:  3.0 mmHg MV Vmax:       1.33 m/s MV Vmean:      82.0 cm/s MV Decel Time: 208 msec MV E velocity: 128.00 cm/s MV A velocity: 107.00 cm/s MV E/A ratio:  1.20 Darryle Decent MD Electronically signed by Darryle Decent MD Signature Date/Time: 04/10/2024/2:50:08 PM    Final    Medications   Scheduled Meds:  allopurinol   100  mg Oral BID   ascorbic acid   500 mg Oral Daily   aspirin  EC  81 mg Oral Daily   azithromycin   500 mg Oral Daily   carvedilol   6.25 mg Oral BID WC   citalopram   20 mg Oral Daily   folic acid   1 mg Oral Daily   hydrochlorothiazide   6.25 mg Oral Daily   ipratropium-albuterol   3 mL Nebulization Q6H   levothyroxine   88 mcg Oral Q0600   pantoprazole   40 mg Oral Daily   predniSONE   40 mg Oral Q breakfast   rosuvastatin   10 mg Oral Daily   Continuous  Infusions:  cefTRIAXone  (ROCEPHIN )  IV 1 g (04/12/24 1058)    LOS: 4 days   Location: remote, [place]  Time spent: 90 minutes  Dr. Sherre, DO [facility]   If 7PM-7AM, please contact night-coverage [email or web address] 04/12/2024, 12:26 PM

## 2024-04-12 NOTE — Progress Notes (Addendum)
 0238-Patient alerting on Current [employer] monitor Alarm : Bradypnea for Pulse Rate. Patient called, unable to reach via preferred cell number.     0239-Patient alerting on Current Health monitor Alarm : Bradypnea for Pulse Rate. Patient called, unable to reach via preferred cell number.    0240-Patient alerting on Current Health monitor Alarm : Bradypnea for Pulse Rate. Patient called, patient reached on home phone number. Patient confirmed stable denied SOB, pain, or need for emergent attention. Patient reported baseline pulse can run mid 30's-40's at night. Patient assessed for need to take PRN cough medication or nebulizer, patient declined need. Encouraged to call RN with concerns, [facility] contact information reviewed. Patient informed mailbox is full on cell phone voicemail. Patient will continue to be monitored. APP notified.

## 2024-04-12 NOTE — Plan of Care (Signed)
  Problem: Education: Goal: Knowledge of General Education information will improve Description: Including pain rating scale, medication(s)/side effects and non-pharmacologic comfort measures Outcome: Progressing   Problem: Clinical Measurements: Goal: Ability to maintain clinical measurements within normal limits will improve Outcome: Progressing Goal: Respiratory complications will improve Outcome: Progressing Goal: Cardiovascular complication will be avoided Outcome: Progressing   Problem: Activity: Goal: Risk for activity intolerance will decrease Outcome: Progressing   Problem: Activity: Goal: Ability to tolerate increased activity will improve Outcome: Progressing   Problem: Respiratory: Goal: Ability to maintain adequate ventilation will improve Outcome: Progressing

## 2024-04-12 NOTE — Assessment & Plan Note (Signed)
 Secondary to CAP, continue treatment per above

## 2024-04-12 NOTE — Progress Notes (Signed)
 [address] [employer] copy, Charity fundraiser, Architect. Patient C/O wheezing and having to sit in recliner to sleep well. Patient reports shortness of breath & cough improving. Neb Tx successful in providing relief. Provider prescribed cough medicine expectorant and another for night suppressant. Labs & Urine sample collected to check for Abx match. Patient denies N/V, chest pain. All scheduled meds taken well tolerated. Communicating with care management for nebulizer for patient.

## 2024-04-12 NOTE — Progress Notes (Signed)
 0745 Phone call to introduce myself as the RN for the day. Patient reports feeling better this morning, also denies any distress during evening alarms for bradycardia, reports HR frequently lowers during the evening. Need to use restroom. Request virtual call back at 0815.

## 2024-04-12 NOTE — Assessment & Plan Note (Signed)
 Home carvedilol  25 mg BID was reduced to 12.5 mg BID Carvedilol  12.5 mg BID reduced to 6.25 mg BID in setting of bradycardia to the 30-40s overnight Continue monitoring as appropriate

## 2024-04-12 NOTE — Plan of Care (Signed)
  Problem: Education: Goal: Knowledge of General Education information will improve Description: Including pain rating scale, medication(s)/side effects and non-pharmacologic comfort measures Outcome: Progressing   Problem: Health Behavior/Discharge Planning: Goal: Ability to manage health-related needs will improve Outcome: Progressing   Problem: Clinical Measurements: Goal: Ability to maintain clinical measurements within normal limits will improve Outcome: Progressing Goal: Diagnostic test results will improve Outcome: Progressing Goal: Respiratory complications will improve Outcome: Progressing Goal: Cardiovascular complication will be avoided Outcome: Progressing   Problem: Activity: Goal: Risk for activity intolerance will decrease Outcome: Progressing   Problem: Nutrition: Goal: Adequate nutrition will be maintained Outcome: Progressing   Problem: Elimination: Goal: Will not experience complications related to urinary retention Outcome: Progressing   Problem: Pain Managment: Goal: General experience of comfort will improve and/or be controlled Outcome: Progressing   Problem: Safety: Goal: Ability to remain free from injury will improve Outcome: Progressing   Problem: Skin Integrity: Goal: Risk for impaired skin integrity will decrease Outcome: Progressing   Problem: Activity: Goal: Ability to tolerate increased activity will improve Outcome: Progressing   Problem: Clinical Measurements: Goal: Ability to maintain a body temperature in the normal range will improve Outcome: Progressing   Problem: Respiratory: Goal: Ability to maintain adequate ventilation will improve Outcome: Progressing Goal: Ability to maintain a clear airway will improve Outcome: Progressing

## 2024-04-12 NOTE — Progress Notes (Signed)
 1700 medic and EMT arrive at patients home for scheduled medications. Elevated BP 192/95, Pule 51. Patient reports this  is high for him his pulse at baseline is on the low side. Patient was given ordered meds coreg  and hydrochlorothiazide  and provider notified. Other medic sent to home with manual BP to retake. Provider sent link for virtual visit. Provider on visit ordered Diltiazem  10mg  IVP. Medic given.  Patient denies any dizziness, HA, chest pressure/pain, palpitations, speaking in complete sentences and calmly. Edema + 2  right leg, left leg trace.  1840 Manual BP 159/82, HR 56, SPO2 96% RA, Temp 97.4 oral.

## 2024-04-12 NOTE — Assessment & Plan Note (Signed)
 Home citalopram  20 mg daily continued

## 2024-04-12 NOTE — Assessment & Plan Note (Addendum)
 Pantoprazole  40 mg daily

## 2024-04-12 NOTE — TOC Transition Note (Signed)
 Transition of Care [facility]) - Discharge Note   Patient Details  Name: [name] MRN: [id number] Date of Birth: Jul 03, 1946  [facility] CM/SW Contact:  Debarah Saunas, RN Phone Number: 04/12/2024, 10:13 AM   Clinical Narrative:    RNCM spoke with pt via telephone regarding PCP hospital follow-up appointment.  Pt states that he would like to make the appointment himself and will call after H@H  visit today.   Final next level of care: Home/Self Care Barriers to Discharge: No Barriers Identified   Patient Goals and CMS Choice Patient states their goals for this hospitalization and ongoing recovery are:: to remain at home getting stronger everyday                                Discharge Plan and Services Additional resources added to the After Visit Summary for                    DME Agency: NA       HH Arranged: NA          Social Drivers of Health (SDOH) Interventions SDOH Screenings   Food Insecurity: No Food Insecurity (04/09/2024)  Housing: Unknown (04/11/2024)  Transportation Needs: No Transportation Needs (04/09/2024)  Utilities: Not At Risk (04/09/2024)  Alcohol Screen: Low Risk  (12/02/2021)  Depression (PHQ2-9): Low Risk  (05/16/2023)  Financial Resource Strain: Low Risk  (12/02/2021)  Physical Activity: Insufficiently Active (12/02/2021)  Social Connections: Moderately Integrated (04/09/2024)  Stress: No Stress Concern Present (12/02/2021)  Tobacco Use: Low Risk  (04/11/2024)     Readmission Risk Interventions    04/10/2024    4:17 PM  Readmission Risk Prevention Plan  Post Dischage Appt Complete  Medication Screening Complete  Transportation Screening Complete

## 2024-04-13 ENCOUNTER — Other Ambulatory Visit: Payer: Self-pay

## 2024-04-13 ENCOUNTER — Telehealth: Payer: Self-pay | Admitting: Acute Care

## 2024-04-13 DIAGNOSIS — A419 Sepsis, unspecified organism: Secondary | ICD-10-CM | POA: Diagnosis not present

## 2024-04-13 LAB — CULTURE, BLOOD (ROUTINE X 2)
Culture: NO GROWTH
Culture: NO GROWTH
Special Requests: ADEQUATE

## 2024-04-13 LAB — BRAIN NATRIURETIC PEPTIDE: B Natriuretic Peptide: 93.7 pg/mL (ref 0.0–100.0)

## 2024-04-13 LAB — BASIC METABOLIC PANEL WITH GFR
Anion gap: 10 (ref 5–15)
BUN: 22 mg/dL (ref 8–23)
CO2: 21 mmol/L — ABNORMAL LOW (ref 22–32)
Calcium: 9.5 mg/dL (ref 8.9–10.3)
Chloride: 104 mmol/L (ref 98–111)
Creatinine, Ser: 0.98 mg/dL (ref 0.61–1.24)
GFR, Estimated: >60 mL/min (ref >60)
Glucose, Bld: 98 mg/dL (ref 70–99)
Potassium: 3.5 mmol/L (ref 3.5–5.1)
Sodium: 135 mmol/L (ref 135–145)

## 2024-04-13 LAB — CBC
HCT: 39.2 % (ref 39.0–52.0)
Hemoglobin: 13 g/dL (ref 13.0–17.0)
MCH: 29.2 pg (ref 26.0–34.0)
MCHC: 33.2 g/dL (ref 30.0–36.0)
MCV: 88.1 fL (ref 80.0–100.0)
Platelets: 212 K/uL (ref 150–400)
RBC: 4.45 MIL/uL (ref 4.22–5.81)
RDW: 16 % — ABNORMAL HIGH (ref 11.5–15.5)
WBC: 5.8 K/uL (ref 4.0–10.5)
nRBC: 0 % (ref 0.0–0.2)

## 2024-04-13 LAB — MAGNESIUM: Magnesium: 1.9 mg/dL (ref 1.7–2.4)

## 2024-04-13 MED ORDER — PREDNISONE 20 MG PO TABS: 30.0000 mg | ORAL_TABLET | Freq: Every day | ORAL | Status: DC

## 2024-04-13 MED ORDER — AMLODIPINE BESYLATE 10 MG PO TABS
10.0000 mg | ORAL_TABLET | Freq: Every day | ORAL | Status: DC
  Administered 2024-04-13: 10 mg via ORAL
  Filled 2024-04-13 (×3): qty 1

## 2024-04-13 MED ORDER — PREDNISONE 20 MG PO TABS
20.0000 mg | ORAL_TABLET | Freq: Every day | ORAL | Status: AC
  Administered 2024-04-14: 20 mg via ORAL
  Filled 2024-04-13: qty 1

## 2024-04-13 MED ORDER — FUROSEMIDE 20 MG PO TABS
20.0000 mg | ORAL_TABLET | Freq: Every day | ORAL | Status: DC | PRN
  Filled 2024-04-13: qty 1

## 2024-04-13 MED ORDER — HYDRALAZINE HCL 20 MG/ML IJ SOLN
10.0000 mg | Freq: Four times a day (QID) | INTRAMUSCULAR | Status: DC | PRN
  Filled 2024-04-13 (×2): qty 0.5

## 2024-04-13 MED ORDER — FLUTICASONE PROPIONATE 50 MCG/ACT NA SUSP
1.0000 | Freq: Every day | NASAL | Status: DC
  Filled 2024-04-13: qty 16

## 2024-04-13 MED ORDER — HYDROCHLOROTHIAZIDE 12.5 MG PO TABS
6.2500 mg | ORAL_TABLET | Freq: Every day | ORAL | Status: AC
  Filled 2024-04-13: qty 1

## 2024-04-13 MED ORDER — AMLODIPINE BESYLATE 5 MG PO TABS
5.0000 mg | ORAL_TABLET | Freq: Every day | ORAL | Status: DC
  Filled 2024-04-13 (×3): qty 1

## 2024-04-13 MED ORDER — POTASSIUM CHLORIDE CRYS ER 20 MEQ PO TBCR
20.0000 meq | EXTENDED_RELEASE_TABLET | Freq: Once | ORAL | Status: AC
  Administered 2024-04-14: 20 meq via ORAL
  Filled 2024-04-13: qty 1

## 2024-04-13 MED ORDER — LORATADINE 10 MG PO TABS
10.0000 mg | ORAL_TABLET | Freq: Every day | ORAL | Status: DC
  Administered 2024-04-13 – 2024-04-14 (×2): 10 mg via ORAL
  Filled 2024-04-13 (×3): qty 1

## 2024-04-13 NOTE — Progress Notes (Signed)
 Signed and dated by patient.  04/13/2024, 1200

## 2024-04-13 NOTE — Progress Notes (Signed)
 Hospital at home progress note  [name]  FMW:[id number] DOB: Mar 10, 1946 DOA: 04/08/2024 PCP: Thedora [name], MD   This patient was seen at his home and I was located in [facility]. Patient was identified as [name] date of birth [birth year]  Brief Narrative: 78 year old male with a history of prostate cancer, hyperlipidemia hypertension admitted to the hospital 8/10 with productive cough and dyspnea.  Patient has been struggling with cough and dyspnea for past few weeks prior to admission to hospital.  He has been to his primary care physician and was treated with steroids Augmentin  and doxycycline  with mild improvement initially.  He came to the ER as his symptoms got worse with worsening cough generalized weakness and dyspnea on exertion.  He was admitted with a diagnosis of severe sepsis/due to community-acquired pneumonia.  At the time of admission he had tachypnea tachycardia and lactic acidosis leukocytosis and endorgan damage such as AKI.  COVID RSV and flu were negative.  With IV fluids IV antibiotics and steroids he improved significantly.   During his hospitalization he had multiple PACs PVCs and NSVT.  CT of the chest -Dense consolidation within the left lower lobe in keeping with changes of acute lobar pneumonia in the appropriate clinical setting. Trace associated left parapneumonic effusion. Diffuse airway inflammation. Moderate multi-vessel coronary artery calcification. Aortic atherosclerosis.  Echocardiogram -left ventricular ejection fraction 60 to 65% with normal function and no regional wall motion abnormalities mild concentric LVH.  Right ventricular systolic function normal.  Mild MR, moderate calcification of the aortic valve.  EKG sinus tachycardia  Assessment & Plan:   Principal Problem:   CAP (community acquired pneumonia) Active Problems:   AKI (acute kidney injury) (HCC)   Essential hypertension   Dyspnea on exertion   Hyponatremia    Mild aortic stenosis   Cough   Bradycardia   Gout   Personal history of prostate cancer   Hypothyroidism   Hyperlipidemia   GERD (gastroesophageal reflux disease)   Aortic atherosclerosis (HCC)   Anxiety and depression    #1 community-acquired pneumonia patient admitted with cough dyspnea on exertion and fatigue was found to have dense consolidation of the left lower lobe with diffuse airway inflammation and trace left parapneumonic effusion.  By CT scan the time of admission he met criteria for sepsis with tachycardia tachypnea leukocytosis lactic acidosis and AKI.  Currently no hypoxia on room air.  Patient was started on Rocephin  and azithromycin  for a total of 5 days.  His last dose will be tomorrow 04/14/2024.  COVID influenza and RSV were negative.  Continue DuoNeb incentive spirometer and flutter valve finish the course of IV antibiotics.  Taper prednisone  as able. Continue Robitussin, Tussionex for cough. Continue PPI, restart loratadine , add Flonase  nasal spray to help with cough.  His cough has been an ongoing issue for few weeks to 2 months.  Has history of allergic rhinitis and GERD. Discussed with PCCM they will call him middle of next week to set up an outpatient follow-up.  #2 AKI resolved with IV fluids  #3 bradycardia asymptomatic on Coreg  25 twice daily was decreased to 6.25 twice daily and now on hold. His EKG at the time of admission showed sinus tachycardia.   Repeat EKG today shows sinus bradycardia rate in the 50s with no evidence of heart block.  He has seen Dr. Loni in the past.  Will discuss with him to call her office and make an appointment for an outpatient follow-up.  #  4  Essential hypertension/hypertensive urgency initial systolic blood pressure was 200/91.  His manual blood pressure was 180 systolic.   HCTZ 12.5 x 1, Norvasc  10 mg x 1 repeat blood pressure 156/80.   Will continue to hold Coreg  due to bradycardia.  #5 GERD continue Protonix   #6 allergic  rhinitis start Flonase  nasal spray hoping this will also help with his cough  #7 hypothyroidism continue Synthroid   #8 hyperlipidemia on rosuvastatin   #9 anxiety and depression on citalopram  20 mg daily  #10 gout continue allopurinol   Estimated body mass index is 34.46 kg/m as calculated from the following:   Height as of this encounter: 6' (1.829 m).   Weight as of this encounter: 115.3 kg.  DVT prophylaxis: SCD code Status: Full code Family Communication: Wife at bedside  disposition Plan:  Status is: Inpatient Remains inpatient appropriate because: Acute illness   Consultants:  None  Procedures: None Antimicrobials:-Azithromycin  Rocephin   Subjective: Patient seen with paramedic Richerd Eastern and nurse Jon Given Appears in no acute distress sitting up in the recliner slept well continues with some on and off cough breathing better denies chest pain dizziness nausea vomiting diarrhea, lower extremity swelling has not increased Slept in recliner last night normally sleeps in bed since the cough and breathing issues have started few WEEKS ago he has been sleeping in the recliner.  BNP today 93.7 With history of NSVT PVCs and PACs and getting 12.5 of HCTZ and potassium of 3.5 will give 20 of potassium in the morning.  1.9.  Objective: Vitals:   04/12/24 1700 04/12/24 1845 04/12/24 2230 04/12/24 2339  BP: (!) 192/95 (!) 159/82 (!) 173/84 (!) 188/86  Pulse: (!) 51 (!) 56 (!) 56 (!) 59  Resp:      Temp:  (!) 97.4 F (36.3 C) (!) 97.5 F (36.4 C)   TempSrc:  Oral Oral   SpO2:  96% 96%   Weight:      Height:        Intake/Output Summary (Last 24 hours) at 04/13/2024 1004 Last data filed at 04/12/2024 1130 Gross per 24 hour  Intake 100 ml  Output --  Net 100 ml   Filed Weights   04/10/24 0344 04/11/24 0502 04/11/24 1357  Weight: 115.5 kg 113.7 kg 115.3 kg    Examination: Exam by Richerd Batty paramedic  General exam: Appears calm and comfortable sitting  up in recliner Respiratory system: Clear to auscultation. Respiratory effort normal. Cardiovascular system: Regular  gastrointestinal system: Abdomen is nondistended, soft and nontender. No organomegaly or masses felt. Normal bowel sounds heard. Central nervous system: Alert and oriented. No focal neurological deficits. Extremities: Edema 1-2+  Data Reviewed: I have personally reviewed following labs and imaging studies  CBC: Recent Labs  Lab 04/08/24 1531 04/09/24 0257 04/10/24 0221 04/11/24 0221 04/12/24 1144  WBC 16.7* 13.2* 13.1* 5.0 5.7  NEUTROABS 15.0* 12.0* 10.7*  --   --   HGB 14.5 12.4* 11.9* 12.6* 12.8*  HCT 44.1 36.9* 35.7* 37.2* 37.9*  MCV 88.9 87.9 88.8 86.7 87.7  PLT 155 138* 149* 196 221   Basic Metabolic Panel: Recent Labs  Lab 04/08/24 1531 04/09/24 0257 04/09/24 1134 04/10/24 0221 04/11/24 0221 04/12/24 1144  NA 132* 133*  --  137 138 138  K 4.1 3.8  --  3.9 3.7 4.0  CL 101 102  --  106 108 107  CO2 18* 21*  --  22 21* 21*  GLUCOSE 100* 87  --  93 129* 107*  BUN 17 16  --  15 15 22   CREATININE 1.46* 1.34*  --  1.17 1.10 1.03  CALCIUM  9.8 8.9  --  9.7 9.8 9.7  MG  --   --  1.7 2.2  --   --    GFR: Estimated Creatinine Clearance: 77.5 mL/min (by C-G formula based on SCr of 1.03 mg/dL). Liver Function Tests: Recent Labs  Lab 04/08/24 1531 04/12/24 1144  AST 27 44*  ALT 25 26  ALKPHOS 72 59  BILITOT 1.2 0.7  PROT 7.2 6.3*  ALBUMIN 3.2* 2.8*   No results for input(s): LIPASE, AMYLASE in the last 168 hours. No results for input(s): AMMONIA in the last 168 hours. Coagulation Profile: No results for input(s): INR, PROTIME in the last 168 hours. Cardiac Enzymes: No results for input(s): CKTOTAL, CKMB, CKMBINDEX, TROPONINI in the last 168 hours. BNP (last 3 results) Recent Labs    01/02/24 1419  PROBNP 53.0   HbA1C: No results for input(s): HGBA1C in the last 72 hours. CBG: No results for input(s): GLUCAP in the  last 168 hours. Lipid Profile: No results for input(s): CHOL, HDL, LDLCALC, TRIG, CHOLHDL, LDLDIRECT in the last 72 hours. Thyroid  Function Tests: No results for input(s): TSH, T4TOTAL, FREET4, T3FREE, THYROIDAB in the last 72 hours. Anemia Panel: No results for input(s): VITAMINB12, FOLATE, FERRITIN, TIBC, IRON, RETICCTPCT in the last 72 hours. Sepsis Labs: Recent Labs  Lab 04/08/24 1736 04/08/24 1748 04/09/24 0257 04/09/24 1134 04/09/24 1354  PROCALCITON 8.30  --   --   --   --   LATICACIDVEN  --  2.3* 2.0* 1.5 1.4    Recent Results (from the past 240 hours)  Resp panel by RT-PCR (RSV, Flu A&B, Covid) Anterior Nasal Swab     Status: None   Collection Time: 04/08/24  3:32 PM   Specimen: Anterior Nasal Swab  Result Value Ref Range Status   SARS Coronavirus 2 by RT PCR NEGATIVE NEGATIVE Final   Influenza A by PCR NEGATIVE NEGATIVE Final   Influenza B by PCR NEGATIVE NEGATIVE Final    Comment: (NOTE) The Xpert Xpress SARS-CoV-2/FLU/RSV plus assay is intended as an aid in the diagnosis of influenza from Nasopharyngeal swab specimens and should not be used as a sole basis for treatment. Nasal washings and aspirates are unacceptable for Xpert Xpress SARS-CoV-2/FLU/RSV testing.  Fact Sheet for Patients: BloggerCourse.com  Fact Sheet for Healthcare Providers: SeriousBroker.it  This test is not yet approved or cleared by the [place]  FDA and has been authorized for detection and/or diagnosis of SARS-CoV-2 by FDA under an Emergency Use Authorization (EUA). This EUA will remain in effect (meaning this test can be used) for the duration of the COVID-19 declaration under Section 564(b)(1) of the Act, 21 U.S.C. section 360bbb-3(b)(1), unless the authorization is terminated or revoked.     Resp Syncytial Virus by PCR NEGATIVE NEGATIVE Final    Comment: (NOTE) Fact Sheet for  Patients: BloggerCourse.com  Fact Sheet for Healthcare Providers: SeriousBroker.it  This test is not yet approved or cleared by the [place]  FDA and has been authorized for detection and/or diagnosis of SARS-CoV-2 by FDA under an Emergency Use Authorization (EUA). This EUA will remain in effect (meaning this test can be used) for the duration of the COVID-19 declaration under Section 564(b)(1) of the Act, 21 U.S.C. section 360bbb-3(b)(1), unless the authorization is terminated or revoked.  Performed at [facility], [address], [place], [place] [address]   Blood culture (routine  x 2)     Status: None   Collection Time: 04/08/24  5:36 PM   Specimen: BLOOD  Result Value Ref Range Status   Specimen Description BLOOD RIGHT ANTECUBITAL  Final   Special Requests   Final    BOTTLES DRAWN AEROBIC AND ANAEROBIC Blood Culture adequate volume   Culture   Final    NO GROWTH 5 DAYS Performed at [facility], [address], [place], [place] [address]    Report Status 04/13/2024 FINAL  Final  Blood culture (routine x 2)     Status: None   Collection Time: 04/08/24  5:41 PM   Specimen: BLOOD  Result Value Ref Range Status   Specimen Description BLOOD BLOOD RIGHT HAND  Final   Special Requests   Final    AEROBIC BOTTLE ONLY Blood Culture results may not be optimal due to an inadequate volume of blood received in culture bottles   Culture   Final    NO GROWTH 5 DAYS Performed at [facility], [address], [place], [place] [address]    Report Status 04/13/2024 FINAL  Final  MRSA Next Gen by PCR, Nasal     Status: None   Collection Time: 04/08/24 10:00 PM   Specimen: Nasal Mucosa; Nasal Swab  Result Value Ref Range Status   MRSA by PCR Next Gen NOT DETECTED NOT DETECTED Final    Comment: (NOTE) The GeneXpert MRSA Assay (FDA approved for NASAL specimens only), is one component of a comprehensive MRSA  colonization surveillance program. It is not intended to diagnose MRSA infection nor to guide or monitor treatment for MRSA infections. Test performance is not FDA approved in patients less than 28 years old. Performed at [facility], [address], [place], [place] [address]      Radiology Studies: No results found.  Scheduled Meds:  allopurinol   100 mg Oral BID   amLODipine   10 mg Oral Daily   ascorbic acid   500 mg Oral Daily   aspirin  EC  81 mg Oral Daily   azithromycin   500 mg Oral Daily   citalopram   20 mg Oral Daily   fluticasone   1 spray Each Nare Daily   folic acid   1 mg Oral Daily   hydrochlorothiazide   6.25 mg Oral Daily   hydrochlorothiazide   6.25 mg Oral Daily   ipratropium-albuterol   3 mL Nebulization Q6H   levothyroxine   88 mcg Oral Q0600   loratadine   10 mg Oral Daily   pantoprazole   40 mg Oral Daily   [START ON 04/14/2024] predniSONE   20 mg Oral Q breakfast   rosuvastatin   10 mg Oral Daily   Continuous Infusions:  cefTRIAXone  (ROCEPHIN )  IV Stopped (04/12/24 1130)     LOS: 5 days   Almarie KANDICE Hoots, MD  04/13/2024, 10:04 AM

## 2024-04-13 NOTE — Progress Notes (Signed)
 Pt's second night time visit was done and he had no complaints at this time. The pt was alert and oriented x4; his skin was warm,dry and normal in color. The pt had a strong radial pulse ad was breathing normally. The pt stated that he just finished his call with the nurse and took his night time medications and albuterol  treatment but wanted to know if it was possible to have more cough syrup. The pt was given robitussin as noted as one of his PRN. The pt's vitals were checked and are as noted.   96% on RA 56 HR  97.5 TEMP 173/89 Automatic 174/80 manual.   RN dawn was informed of same. She contacted the provider whom informed to give the pt his PRN Hydralazine . The pt was given Hydralazine  per his medication order. After 30 minutes the pt's blood pressure was reassessed and noted to be 161/77 automatic. The pt stated that he still did not have any symptoms at this time. The pt's daughter was instructed on how to check her father blood pressure and was informed that if his blood pressure was the 170-180s systolic to call back.   The pt also stated that he has not been able to provide a urine sample at this time but has been drinking plenty of water .  The pt and his family were informed to call back if anything were to change and they agreed to same. At this time this paramedic left the pt's home.

## 2024-04-13 NOTE — Progress Notes (Signed)
 Arrived to find the PT sitting upright at his kitchen table. No obvious distress or trauma noted. PT self ambulated to the sofa without assistance or incident. Prior to paramedic arrival, PT has been having HTN episodes with a systolic being as high as 200. MD Alvia was made aware and changed medications accordingly. PT stated that he is feeling fine, denied having any vision problems, headache, or fatigue.   PT's vitals were taken and documented accordingly. Medications were administered per orders. 12-lead ECG was performed and found to be sinus bradycardia. MD Alvia interpreted same and signed off. Physical exam showed negative DCAPBTLS to the head, neck, face. PERRLA. Denied any visual changes or disturbances. Negative JVD or tracheal deviation. Chest expansion is equal with non-labored breathing. Productive cough with phlegm. ABD is soft, non-tender to palpation. PT has urinary incontinence at baseline. He was able to produce a urine sample and found to be clear and no foul smell noted. Pedal edema noted in right leg, at baseline. PT is not wearing his compression sleeve, +2 pitting edema. No erythema noted. Denied any tenderness upon palpation.   IV blood draw was performed and were sent to the lab at the hospital. PT's final BP was 150 systolic and he stated he was feeling good. PT's questions were asked and he was reminded that if he had any other questions or was not feeling well to call the virtual RN. He requested his PM visit to be at 1800.

## 2024-04-13 NOTE — Progress Notes (Signed)
 RN completed tablet call with patient to administer bedtime medication. Patient found stable and without complaints at time of call. All medications given, flutter valve exercise completed, and patient informed of plan for the evening. EMT will visit patient this evening to recheck blood pressure and patient is aware. Patient reminded to call RN if any needs arise overnight.

## 2024-04-13 NOTE — Plan of Care (Signed)
  Problem: Education: Goal: Knowledge of General Education information will improve Description: Including pain rating scale, medication(s)/side effects and non-pharmacologic comfort measures Outcome: Progressing   Problem: Health Behavior/Discharge Planning: Goal: Ability to manage health-related needs will improve Outcome: Progressing   Problem: Clinical Measurements: Goal: Ability to maintain clinical measurements within normal limits will improve Outcome: Progressing Goal: Diagnostic test results will improve Outcome: Progressing Goal: Respiratory complications will improve Outcome: Progressing Goal: Cardiovascular complication will be avoided Outcome: Progressing   Problem: Elimination: Goal: Will not experience complications related to bowel motility Outcome: Progressing   Problem: Pain Managment: Goal: General experience of comfort will improve and/or be controlled Outcome: Progressing   Problem: Safety: Goal: Ability to remain free from injury will improve Outcome: Progressing   Problem: Activity: Goal: Ability to tolerate increased activity will improve Outcome: Progressing   Problem: Clinical Measurements: Goal: Ability to maintain a body temperature in the normal range will improve Outcome: Progressing   Problem: Respiratory: Goal: Ability to maintain adequate ventilation will improve Outcome: Progressing Goal: Ability to maintain a clear airway will improve Outcome: Progressing

## 2024-04-13 NOTE — Progress Notes (Signed)
 9189 virtual am visit morning med pass patient identification completed patient self administered per [facility]. Patient reports feeling well rested. Took BP with patient on camera to check placement  BP 212/100, HR 58 contacted provider. Patient denies chest pain/pressure, HA, dizziness, speaking calmly full sentences. Endorses feeling well. After am meds BP 190/90 HR 53. Conference call with providers, Charity fundraiser, pharmacy, and Tax inspector. Plan is to titrate down prednisone  to 10mg , additional hydrochlorothiazide  total dose 12.5 mg po and Norvasc  10mg  po. Medic will have aprazaline and lasix  on hand.  0935 end call with patient. Labs ordered to check potassium and EKG ordered as well.

## 2024-04-13 NOTE — Progress Notes (Signed)
 1050 virtual visit with provider, medic, and RNs patient reports feeling well. Is still sleeping in recliner at night and no difficulty breathing or post nasal drip. Having productive cough small amount greenish tinged sputum. Denies N/V, chest discomfort, BP still elevated provider ordered additional meds to have on hand. EKG ordered.

## 2024-04-13 NOTE — Progress Notes (Signed)
 Patient's daughter was able to recheck his blood pressure prior to him going to bed. Repeat blood pressure was 188/86 with pulse of 59. Patient continues to deny any symptoms with elevated blood pressure. RN informed him to please call with any headache, chest pain, dizziness, or shortness of breath. Patient agreed to do so. Provider on call was notified, no new orders at this time. Patient did request a prn breathing treatment right before bed and stated that he was going to attempt to sleep in his bed tonight. RN will continue to monitor closely throughout the shift.

## 2024-04-14 DIAGNOSIS — I10 Essential (primary) hypertension: Secondary | ICD-10-CM | POA: Diagnosis not present

## 2024-04-14 LAB — BASIC METABOLIC PANEL WITH GFR
Anion gap: 9 (ref 5–15)
BUN: 19 mg/dL (ref 8–23)
CO2: 25 mmol/L (ref 22–32)
Calcium: 10.1 mg/dL (ref 8.9–10.3)
Chloride: 104 mmol/L (ref 98–111)
Creatinine, Ser: 1.08 mg/dL (ref 0.61–1.24)
GFR, Estimated: >60 mL/min (ref >60)
Glucose, Bld: 103 mg/dL — ABNORMAL HIGH (ref 70–99)
Potassium: 3.2 mmol/L — ABNORMAL LOW (ref 3.5–5.1)
Sodium: 138 mmol/L (ref 135–145)

## 2024-04-14 LAB — MAGNESIUM: Magnesium: 1.9 mg/dL (ref 1.7–2.4)

## 2024-04-14 MED ORDER — PREDNISONE 20 MG PO TABS: ORAL_TABLET | ORAL | 0 refills | Status: AC

## 2024-04-14 MED ORDER — AMLODIPINE BESYLATE 5 MG PO TABS: 5.0000 mg | ORAL_TABLET | Freq: Every day | ORAL | 2 refills | Status: AC

## 2024-04-14 MED ORDER — FLUTICASONE PROPIONATE 50 MCG/ACT NA SUSP: 1.0000 | Freq: Every day | NASAL | 2 refills | Status: AC

## 2024-04-14 MED ORDER — AMLODIPINE BESYLATE 5 MG PO TABS
5.0000 mg | ORAL_TABLET | Freq: Every day | ORAL | Status: DC
  Administered 2024-04-14: 5 mg via ORAL
  Filled 2024-04-14 (×2): qty 1

## 2024-04-14 MED ORDER — CARVEDILOL 3.125 MG PO TABS: 3.1250 mg | ORAL_TABLET | Freq: Two times a day (BID) | ORAL | 11 refills | Status: AC

## 2024-04-14 MED ORDER — POTASSIUM CHLORIDE CRYS ER 20 MEQ PO TBCR
40.0000 meq | EXTENDED_RELEASE_TABLET | Freq: Once | ORAL | Status: AC
  Administered 2024-04-14: 40 meq via ORAL
  Filled 2024-04-14: qty 2

## 2024-04-14 MED ORDER — HYDROCHLOROTHIAZIDE 12.5 MG PO TABS
12.5000 mg | ORAL_TABLET | Freq: Every day | ORAL | Status: DC
  Administered 2024-04-14: 12.5 mg via ORAL
  Filled 2024-04-14 (×2): qty 1

## 2024-04-14 MED ORDER — LORATADINE 10 MG PO TABS: 10.0000 mg | ORAL_TABLET | Freq: Every day | ORAL | 2 refills | Status: AC

## 2024-04-14 NOTE — Discharge Summary (Signed)
 Physician Discharge Summary  [name] FMW:[id number] DOB: Nov 15, 1945 DOA: 04/08/2024 Hospital at home discharge summary PCP: Thedora [name], MD Patient identified as [name] Nov 02, 1945 I was located in [place]  at my home.   Admit date: 04/08/2024 Discharge date: 04/14/2024  Admitted From: home Disposition: home Recommendations for Outpatient Follow-up:  Follow up with PCP in 1-2 weeks Please obtain BMP/CBC in one week Please follow up with cardiology for bradycardia and pulmonary for chronic cough  Home Health:no Equipment/Devices none Discharge Condition:stable CODE STATUS:full Diet recommendation: cardiac Brief/Interim Summary:  78 year old male with a history of prostate cancer, hyperlipidemia hypertension admitted to the hospital 8/10 with productive cough and dyspnea.  Patient has been struggling with cough and dyspnea for past few weeks prior to admission to hospital.  He has been to his primary care physician and was treated with steroids Augmentin  and doxycycline  with mild improvement initially.  He came to the ER as his symptoms got worse with worsening cough generalized weakness and dyspnea on exertion.  He was admitted with a diagnosis of severe sepsis/due to community-acquired pneumonia.  At the time of admission he had tachypnea tachycardia and lactic acidosis leukocytosis and endorgan damage such as AKI.  COVID RSV and flu were negative.  With IV fluids IV antibiotics and steroids he improved significantly.   During his hospitalization he had multiple PACs PVCs and NSVT.   CT of the chest -Dense consolidation within the left lower lobe in keeping with changes of acute lobar pneumonia in the appropriate clinical setting. Trace associated left parapneumonic effusion. Diffuse airway inflammation. Moderate multi-vessel coronary artery calcification. Aortic atherosclerosis.   Echocardiogram -left ventricular ejection fraction 60 to 65% with normal function  and no regional wall motion abnormalities mild concentric LVH.  Right ventricular systolic function normal.  Mild MR, moderate calcification of the aortic valve.   EKG sinus tachycardia    Discharge Diagnoses:  Principal Problem:   CAP (community acquired pneumonia) Active Problems:   AKI (acute kidney injury) (HCC)   Essential hypertension   Dyspnea on exertion   Hyponatremia   Mild aortic stenosis   Cough   Bradycardia   Gout   Personal history of prostate cancer   Hypothyroidism   Hyperlipidemia   GERD (gastroesophageal reflux disease)   Aortic atherosclerosis (HCC)   Anxiety and depression  #1 community-acquired pneumonia patient admitted with cough dyspnea on exertion and fatigue was found to have dense consolidation of the left lower lobe with diffuse airway inflammation and trace left parapneumonic effusion.  By CT scan the time of admission he met criteria for sepsis with tachycardia tachypnea leukocytosis lactic acidosis and AKI.  Currently no hypoxia on room air.  Patient was started on Rocephin  and azithromycin  for a total of 5 days.  His last dose was on  04/14/2024.  COVID influenza and RSV were negative.  Continue loratadine , add Flonase  nasal spray to help with cough.  His cough has been an ongoing issue for few weeks to 2 months.  Has history of allergic rhinitis and GERD. Discussed with PCCM they will call him middle of next week to set up an outpatient follow-up. He was on RA on the day of dc.   #2 AKI resolved with IV fluids   #3 bradycardia asymptomatic on Coreg  25 twice daily.  Coreg  was on hold, at 1 point his heart rate went up to 90s.  With history of PACs PVCs and NSVT in the past he was placed on Coreg  3.125 mg  twice daily till he follows up with cardiology.  EKG showed sinus bradycardia.  He is aware he needs to call the cardiology office to make an appointment.   #4  Essential hypertension/hypertensive urgency -improved and resolved with Norvasc .  He was  discharged on Norvasc  5 mg daily and Coreg  3.125 mg twice daily.     #5 GERD continue Protonix    #6 allergic rhinitis start Flonase  nasal spray hoping this will also help with his cough   #7 hypothyroidism continue Synthroid    #8 hyperlipidemia on rosuvastatin    #9 anxiety and depression on citalopram  20 mg daily   #10 gout continue allopurinol   #11 hypokalemia potassium was 3.2 he was given 40 mEq of potassium.  He was asked to follow-up with his PCP for repeat CBC BMP within 1 week.   Estimated body mass index is 34.46 kg/m as calculated from the following:   Height as of this encounter: 6' (1.829 m).   Weight as of this encounter: 115.3 kg.  Discharge Instructions   Allergies as of 04/14/2024   No Known Allergies      Medication List     STOP taking these medications    albuterol  108 (90 Base) MCG/ACT inhaler Commonly known as: VENTOLIN  HFA   amoxicillin -clavulanate 875-125 MG tablet Commonly known as: AUGMENTIN    doxycycline  100 MG tablet Commonly known as: VIBRA -TABS   ibuprofen 600 MG tablet Commonly known as: ADVIL   OVER THE COUNTER MEDICATION   pantoprazole  40 MG tablet Commonly known as: PROTONIX        TAKE these medications    allopurinol  100 MG tablet Commonly known as: ZYLOPRIM  TAKE 1 TABLET(100 MG) BY MOUTH TWICE DAILY What changed: See the new instructions.   amLODipine  5 MG tablet Commonly known as: NORVASC  Take 1 tablet (5 mg total) by mouth daily.   ascorbic acid  500 MG tablet Commonly known as: VITAMIN C  Take 500 mg by mouth daily.   aspirin  EC 81 MG tablet Take 81 mg by mouth daily. Swallow whole.   carvedilol  3.125 MG tablet Commonly known as: Coreg  Take 1 tablet (3.125 mg total) by mouth 2 (two) times daily. What changed:  medication strength how much to take when to take this additional instructions   chlorpheniramine-HYDROcodone  10-8 MG/5ML Commonly known as: TUSSIONEX Take 5 mLs by mouth every 12 (twelve)  hours as needed for up to 2 days for cough.   cholecalciferol 25 MCG (1000 UNIT) tablet Commonly known as: VITAMIN D3 Take 1,000 Units by mouth daily.   citalopram  20 MG tablet Commonly known as: CELEXA  TAKE 1 TABLET(20 MG) BY MOUTH DAILY What changed: See the new instructions.   COQ10 PO Take 1 capsule by mouth daily.   fluticasone  50 MCG/ACT nasal spray Commonly known as: FLONASE  Place 1 spray into both nostrils daily.   folic acid  800 MCG tablet Commonly known as: FOLVITE  Take 800 mcg by mouth daily.   levothyroxine  88 MCG tablet Commonly known as: SYNTHROID  TAKE 1 TABLET(88 MCG) BY MOUTH DAILY BEFORE BREAKFAST What changed: See the new instructions.   loratadine  10 MG tablet Commonly known as: CLARITIN  Take 1 tablet (10 mg total) by mouth daily. What changed:  when to take this reasons to take this   predniSONE  20 MG tablet Commonly known as: DELTASONE  Take 2 tablets daily for 5 days then stop   rosuvastatin  10 MG tablet Commonly known as: CRESTOR  TAKE 1 TABLET(10 MG) BY MOUTH DAILY What changed: See the new instructions.  Follow-up Information     Thedora [name], MD. Call in 1 week(s).   Specialty: Family Medicine Why: Patient is calling for followup Contact information: [address] [place] [address] [phone]         Loni Soyla LABOR, MD Follow up.   Specialties: Cardiology, Radiology Why: please call office to make an appointment for bradycardia Contact information: [address] [place] [address] [phone]         Neda Jennet LABOR, MD Follow up.   Specialty: Pulmonary Disease Why: they will call you next week if you dont hear from them please call them to make an appointment for chronic cough Contact information: [address] [place] [address] [phone]                No Known Allergies  Consultations: none   Procedures/Studies: ECHOCARDIOGRAM COMPLETE Result  Date: 04/10/2024    ECHOCARDIOGRAM REPORT   Patient Name:   KARI KERTH Date of Exam: 04/10/2024 Medical Rec #:  [id number]    Height:       72.0 in Accession #:    [id number]   Weight:       254.6 lb Date of Birth:  01/07/1946     BSA:          2.361 m Patient Age:    78 years     BP:           150/88 mmHg Patient Gender: M            HR:           69 bpm. Exam Location:  Inpatient Procedure: 2D Echo, Cardiac Doppler and Color Doppler (Both Spectral and Color            Flow Doppler were utilized during procedure). Indications:    R06.02 SOB  History:        Patient has prior history of Echocardiogram examinations, most                 recent 10/20/2022. Signs/Symptoms:Dyspnea and Shortness of                 Breath; Risk Factors:Hypertension and Dyslipidemia. Pneumonia.  Sonographer:    Ellouise Mose RDCS Referring Phys: [id number] [facility]  Sonographer Comments: Patient is obese. Image acquisition challenging due to patient body habitus. IMPRESSIONS  1. Left ventricular ejection fraction, by estimation, is 60 to 65%. The left ventricle has normal function. The left ventricle has no regional wall motion abnormalities. There is mild concentric left ventricular hypertrophy. Left ventricular diastolic parameters were normal.  2. Right ventricular systolic function is normal. The right ventricular size is normal. Tricuspid regurgitation signal is inadequate for assessing PA pressure.  3. The mitral valve is degenerative. Mild mitral valve regurgitation. No evidence of mitral stenosis.  4. The aortic valve is tricuspid. There is moderate calcification of the aortic valve. Aortic valve regurgitation is trivial. Mild aortic valve stenosis. Aortic valve area, by VTI measures 1.91 cm. Aortic valve mean gradient measures 11.5 mmHg. Aortic valve Vmax measures 2.32 m/s.  5. The inferior vena cava is normal in size with greater than 50% respiratory variability, suggesting right atrial pressure of 3 mmHg. FINDINGS  Left  Ventricle: Left ventricular ejection fraction, by estimation, is 60 to 65%. The left ventricle has normal function. The left ventricle has no regional wall motion abnormalities. The left ventricular internal cavity size was normal in size. There is  mild concentric left  ventricular hypertrophy. Left ventricular diastolic parameters were normal. Right Ventricle: The right ventricular size is normal. No increase in right ventricular wall thickness. Right ventricular systolic function is normal. Tricuspid regurgitation signal is inadequate for assessing PA pressure. Left Atrium: Left atrial size was normal in size. Right Atrium: Right atrial size was normal in size. Pericardium: There is no evidence of pericardial effusion. Mitral Valve: The mitral valve is degenerative in appearance. Mild mitral annular calcification. Mild mitral valve regurgitation. No evidence of mitral valve stenosis. MV peak gradient, 7.1 mmHg. The mean mitral valve gradient is 3.0 mmHg. Tricuspid Valve: The tricuspid valve is grossly normal. Tricuspid valve regurgitation is trivial. No evidence of tricuspid stenosis. Aortic Valve: The aortic valve is tricuspid. There is moderate calcification of the aortic valve. Aortic valve regurgitation is trivial. Mild aortic stenosis is present. Aortic valve mean gradient measures 11.5 mmHg. Aortic valve peak gradient measures 21.4 mmHg. Aortic valve area, by VTI measures 1.91 cm. Pulmonic Valve: The pulmonic valve was normal in structure. Pulmonic valve regurgitation is not visualized. No evidence of pulmonic stenosis. Aorta: The aortic root and ascending aorta are structurally normal, with no evidence of dilitation. Venous: The right lower pulmonary vein is normal. The inferior vena cava is normal in size with greater than 50% respiratory variability, suggesting right atrial pressure of 3 mmHg. IAS/Shunts: No atrial level shunt detected by color flow Doppler.  LEFT VENTRICLE PLAX 2D LVIDd:         4.42 cm      Diastology LVIDs:         2.81 cm     LV e' medial:    9.57 cm/s LV PW:         1.10 cm     LV E/e' medial:  13.4 LV IVS:        1.22 cm     LV e' lateral:   9.79 cm/s LVOT diam:     2.30 cm     LV E/e' lateral: 13.1 LV SV:         96 LV SV Index:   41 LVOT Area:     4.15 cm  LV Volumes (MOD) LV vol d, MOD A2C: 89.7 ml LV vol d, MOD A4C: 77.1 ml LV vol s, MOD A2C: 29.2 ml LV vol s, MOD A4C: 31.0 ml LV SV MOD A2C:     60.5 ml LV SV MOD A4C:     77.1 ml LV SV MOD BP:      52.1 ml RIGHT VENTRICLE            IVC RV S prime:     7.40 cm/s  IVC diam: 1.90 cm TAPSE (M-mode): 1.6 cm LEFT ATRIUM             Index        RIGHT ATRIUM           Index LA diam:        3.27 cm 1.39 cm/m   RA Area:     16.20 cm LA Vol (A2C):   61.1 ml 25.88 ml/m  RA Volume:   40.00 ml  16.94 ml/m LA Vol (A4C):   15.8 ml 6.69 ml/m LA Biplane Vol: 31.6 ml 13.39 ml/m  AORTIC VALVE AV Area (Vmax):    1.78 cm AV Area (Vmean):   1.76 cm AV Area (VTI):     1.91 cm AV Vmax:           231.50 cm/s AV Vmean:  154.500 cm/s AV VTI:            0.502 m AV Peak Grad:      21.4 mmHg AV Mean Grad:      11.5 mmHg LVOT Vmax:         99.10 cm/s LVOT Vmean:        65.300 cm/s LVOT VTI:          0.231 m LVOT/AV VTI ratio: 0.46  AORTA Ao Root diam: 3.74 cm Ao Asc diam:  3.47 cm MITRAL VALVE MV Area (PHT): 3.66 cm     SHUNTS MV Area VTI:   2.85 cm     Systemic VTI:  0.23 m MV Peak grad:  7.1 mmHg     Systemic Diam: 2.30 cm MV Mean grad:  3.0 mmHg MV Vmax:       1.33 m/s MV Vmean:      82.0 cm/s MV Decel Time: 208 msec MV E velocity: 128.00 cm/s MV A velocity: 107.00 cm/s MV E/A ratio:  1.20 Darryle Decent MD Electronically signed by Darryle Decent MD Signature Date/Time: 04/10/2024/2:50:08 PM    Final    DG Swallowing Func-Speech Pathology Result Date: 04/10/2024 Table formatting from the original result was not included. Modified Barium Swallow Study Patient Details Name: Ausencio Vaden MRN: [id number] Date of Birth: 21-Aug-1946 Today's Date: 04/10/2024  HPI/PMH: HPI: The pt is a 78 yo male presenting 8/10 with cough and SOB, found to have sepsis due to CAP. PMH includes: recent PNA resolved after antibiotics, HTN, hypothyroidism, prostate cancer, UTI, gout, arthritis, GERD, high cholesterol, and R TKA. Clinical Impression: Pt has a functional oropharyngeal swallow with good efficiency and no aspiration observed. He appears to have large osteophytes that contribute to delayed clearance of the barium tablet from the valleculae, but this cleared with additional thin liquids boluses. Bolus flow of other consistencies was not impacted. Pt also had retention of barium in the esophagus with retrograde flow that stayed below the level of the UES. Suspect that his greatest risk for aspiration is post-prandial. Education was reinforced about esophageal precautions. Would continue regular solids and thin liquids with liquid washes.  Factors that may increase risk of adverse event in presence of aspiration Noe & Lianne 2021): Factors that may increase risk of adverse event in presence of aspiration Noe & Lianne 2021): Respiratory or GI disease Recommendations/Plan: Swallowing Evaluation Recommendations Swallowing Evaluation Recommendations Recommendations: PO diet PO Diet Recommendation: Regular; Thin liquids (Level 0) Liquid Administration via: Cup; Straw Medication Administration: Whole meds with liquid Supervision: Patient able to self-feed Swallowing strategies  : Slow rate; Small bites/sips; Follow solids with liquids Postural changes: Position pt fully upright for meals; Stay upright 30-60 min after meals Oral care recommendations: Oral care BID (2x/day) Treatment Plan Treatment Plan Treatment recommendations: No treatment recommended at this time Follow-up recommendations: No SLP follow up Recommendations Recommendations for follow up therapy are one component of a multi-disciplinary discharge planning process, led by the attending physician.  Recommendations  may be updated based on patient status, additional functional criteria and insurance authorization. Assessment: Orofacial Exam: Orofacial Exam Oral Cavity: Oral Hygiene: WFL Oral Cavity - Dentition: Adequate natural dentition Orofacial Anatomy: WFL Oral Motor/Sensory Function: WFL Anatomy: Anatomy: Suspected cervical osteophytes Boluses Administered: Boluses Administered Boluses Administered: Thin liquids (Level 0); Mildly thick liquids (Level 2, nectar thick); Puree; Solid  Oral Impairment Domain: Oral Impairment Domain Lip Closure: No labial escape Tongue control during bolus hold: Cohesive bolus between tongue to palatal seal Bolus preparation/mastication:  Timely and efficient chewing and mashing Bolus transport/lingual motion: Brisk tongue motion Oral residue: Residue collection on oral structures (cleared with spontaneous second swallow) Location of oral residue : Tongue Initiation of pharyngeal swallow : Pyriform sinuses  Pharyngeal Impairment Domain: Pharyngeal Impairment Domain Soft palate elevation: No bolus between soft palate (SP)/pharyngeal wall (PW) Laryngeal elevation: Complete superior movement of thyroid  cartilage with complete approximation of arytenoids to epiglottic petiole Anterior hyoid excursion: Partial anterior movement Epiglottic movement: Complete inversion Laryngeal vestibule closure: Complete, no air/contrast in laryngeal vestibule Pharyngeal stripping wave : Present - complete Pharyngeal contraction (A/P view only): N/A Pharyngoesophageal segment opening: Partial distention/partial duration, partial obstruction of flow Tongue base retraction: No contrast between tongue base and posterior pharyngeal wall (PPW) Pharyngeal residue: Complete pharyngeal clearance Location of pharyngeal residue: N/A  Esophageal Impairment Domain: Esophageal Impairment Domain Esophageal clearance upright position: Esophageal retention with retrograde flow below pharyngoesophageal segment (PES) Pill: Pill  Consistency administered: Thin liquids (Level 0) Thin liquids (Level 0): Impaired (see clinical impressions) Penetration/Aspiration Scale Score: Penetration/Aspiration Scale Score 1.  Material does not enter airway: Puree; Solid; Pill 2.  Material enters airway, remains ABOVE vocal cords then ejected out: Thin liquids (Level 0); Mildly thick liquids (Level 2, nectar thick) Compensatory Strategies: Compensatory Strategies Compensatory strategies: Yes Liquid wash: Effective Effective Liquid Wash: Pill   General Information: Caregiver present: No  Diet Prior to this Study: Regular; Thin liquids (Level 0)   Temperature : Normal   Respiratory Status: WFL   Supplemental O2: None (Room air)   History of Recent Intubation: No  Behavior/Cognition: Alert; Cooperative; Pleasant mood Self-Feeding Abilities: Able to self-feed Baseline vocal quality/speech: Normal Volitional Cough: Able to elicit Volitional Swallow: Able to elicit Exam Limitations: No limitations Goal Planning: Prognosis for improved oropharyngeal function: Good No data recorded No data recorded Patient/Family Stated Goal: to feel better Consulted and agree with results and recommendations: Patient Pain: Pain Assessment Pain Assessment: Faces Faces Pain Scale: 0 End of Session: Start Time:SLP Start Time (ACUTE ONLY): 1157 Stop Time: SLP Stop Time (ACUTE ONLY): 1212 Time Calculation:SLP Time Calculation (min) (ACUTE ONLY): 15 min Charges: SLP Evaluations $ SLP Speech Visit: 1 Visit SLP Evaluations $BSS Swallow: 1 Procedure $MBS Swallow: 1 Procedure SLP visit diagnosis: SLP Visit Diagnosis: Dysphagia, unspecified (R13.10) Past Medical History: Past Medical History: Diagnosis Date  Arthritis   knees. back  Bladder cancer (HCC)   2018  High cholesterol   Hypertension   Hypothyroidism   prostate cancer   2000, 2003  Thyroid  disease  Past Surgical History: Past Surgical History: Procedure Laterality Date  EYE SURGERY Bilateral 2014  HERNIA REPAIR Right 1984  inguinal   IR RADIOLOGIST EVAL & MGMT  03/19/2021  KNEE ARTHROSCOPY Left 1966  LUMBAR LAMINECTOMY/DECOMPRESSION MICRODISCECTOMY N/A 05/13/2022  Procedure: Microlumbar laminectomy Lumbar three-four , Lumbar four-five;  Surgeon: Duwayne Purchase, MD;  Location: [facility] OR;  Service: Orthopedics;  Laterality: N/A;  PENILE PROSTHESIS IMPLANT  2014  TOTAL KNEE ARTHROPLASTY Right 08/11/2020  Procedure: TOTAL KNEE ARTHROPLASTY;  Surgeon: Melodi Lerner, MD;  Location: [facility] ORS;  Service: Orthopedics;  Laterality: Right;   TRANSURETHRAL RESECTION OF BLADDER TUMOR  2018 Leita SAILOR., M.A. CCC-SLP Acute Rehabilitation Services Office: [phone] Secure chat preferred 04/10/2024, 1:31 PM  CT CHEST WO CONTRAST Result Date: 04/09/2024 CLINICAL DATA:  Dyspnea, recurrent pneumonia EXAM: CT CHEST WITHOUT CONTRAST TECHNIQUE: Multidetector CT imaging of the chest was performed following the standard protocol without IV contrast. RADIATION DOSE REDUCTION: This exam was performed according to the departmental  dose-optimization program which includes automated exposure control, adjustment of the mA and/or kV according to patient size and/or use of iterative reconstruction technique. COMPARISON:  07/01/2022 FINDINGS: Cardiovascular: Moderate multi-vessel coronary artery calcification. Global cardiac size iswithin normal limits. No pericardial effusion. Central pulmonary arteries are of normal caliber. Mild atherosclerotic calcification within the thoracic aorta. No aortic aneurysm. Mediastinum/Nodes: No enlarged mediastinal or axillary lymph nodes. Thyroid  gland, trachea, and esophagus demonstrate no significant findings. Lungs/Pleura: There is dense consolidation posterior and lateral basal segments of the left lower lobe in keeping with changes of acute lobar pneumonia in the appropriate clinical setting. Trace associated left parapneumonic effusion noted layering dependently. The lungs are otherwise clear. No pneumothorax. No pleural effusion on the  right. Bronchial wall thickening noted in keeping with airway inflammation. No central obstructing lesion Upper Abdomen: No acute abnormality. Musculoskeletal: No chest wall mass or suspicious bone lesions identified. IMPRESSION: 1. Dense consolidation within the left lower lobe in keeping with changes of acute lobar pneumonia in the appropriate clinical setting. Trace associated left parapneumonic effusion. 2. Diffuse airway inflammation. 3. Moderate multi-vessel coronary artery calcification. 4. Aortic atherosclerosis. Aortic Atherosclerosis (ICD10-I70.0). Electronically Signed   By: Dorethia Molt M.D.   On: 04/09/2024 23:23   DG Chest 2 View Result Date: 04/08/2024 CLINICAL DATA:  10031 Cough 10031 EXAM: CHEST - 2 VIEW COMPARISON:  February 06, 2024 FINDINGS: Lower lung volumes. Hazy airspace opacities in the left lower lobe. No pleural effusion or pneumothorax. No cardiomegaly. No acute fracture or destructive lesion. Multilevel degenerative disc disease of the spine. IMPRESSION: Left lower lobe pneumonia. A 2 view chest radiograph in 6-12 weeks is recommended to document resolution once treatment is complete. Electronically Signed   By: Rogelia Myers M.D.   On: 04/08/2024 16:49   (Echo, Carotid, EGD, Colonoscopy, ERCP)    Subjective:  Feels better no cough Discharge Exam: Vitals:   04/13/24 2257 04/14/24 0830  BP: (!) 146/88 134/81  Pulse: 86 78  Resp:    Temp:    SpO2:     Vitals:   04/13/24 1830 04/13/24 2242 04/13/24 2257 04/14/24 0830  BP: (!) 192/90 (!) 167/94 (!) 146/88 134/81  Pulse: (!) 58 92 86 78  Resp:      Temp:      TempSrc:      SpO2:      Weight:      Height:      Physical exam performed by Richerd Batty.  Paramedic.  General: Pt is alert, awake, not in acute distress Cardiovascular: RRR, S1/S2 +, no rubs, no gallops Respiratory: CTA bilaterally, no wheezing, no rhonchi Abdominal: Soft, NT, ND, bowel sounds + Extremities:  1 plus edema me  The results of  significant diagnostics from this hospitalization (including imaging, microbiology, ancillary and laboratory) are listed below for reference.     Microbiology: Recent Results (from the past 240 hours)  Resp panel by RT-PCR (RSV, Flu A&B, Covid) Anterior Nasal Swab     Status: None   Collection Time: 04/08/24  3:32 PM   Specimen: Anterior Nasal Swab  Result Value Ref Range Status   SARS Coronavirus 2 by RT PCR NEGATIVE NEGATIVE Final   Influenza A by PCR NEGATIVE NEGATIVE Final   Influenza B by PCR NEGATIVE NEGATIVE Final    Comment: (NOTE) The Xpert Xpress SARS-CoV-2/FLU/RSV plus assay is intended as an aid in the diagnosis of influenza from Nasopharyngeal swab specimens and should not be used as a sole basis for treatment. Nasal washings  and aspirates are unacceptable for Xpert Xpress SARS-CoV-2/FLU/RSV testing.  Fact Sheet for Patients: BloggerCourse.com  Fact Sheet for Healthcare Providers: SeriousBroker.it  This test is not yet approved or cleared by the [place]  FDA and has been authorized for detection and/or diagnosis of SARS-CoV-2 by FDA under an Emergency Use Authorization (EUA). This EUA will remain in effect (meaning this test can be used) for the duration of the COVID-19 declaration under Section 564(b)(1) of the Act, 21 U.S.C. section 360bbb-3(b)(1), unless the authorization is terminated or revoked.     Resp Syncytial Virus by PCR NEGATIVE NEGATIVE Final    Comment: (NOTE) Fact Sheet for Patients: BloggerCourse.com  Fact Sheet for Healthcare Providers: SeriousBroker.it  This test is not yet approved or cleared by the [place]  FDA and has been authorized for detection and/or diagnosis of SARS-CoV-2 by FDA under an Emergency Use Authorization (EUA). This EUA will remain in effect (meaning this test can be used) for the duration of the COVID-19  declaration under Section 564(b)(1) of the Act, 21 U.S.C. section 360bbb-3(b)(1), unless the authorization is terminated or revoked.  Performed at [facility], [address], [place], [place] [address]   Blood culture (routine x 2)     Status: None   Collection Time: 04/08/24  5:36 PM   Specimen: BLOOD  Result Value Ref Range Status   Specimen Description BLOOD RIGHT ANTECUBITAL  Final   Special Requests   Final    BOTTLES DRAWN AEROBIC AND ANAEROBIC Blood Culture adequate volume   Culture   Final    NO GROWTH 5 DAYS Performed at [facility], [address] [place], [place] [address]    Report Status 04/13/2024 FINAL  Final  Blood culture (routine x 2)     Status: None   Collection Time: 04/08/24  5:41 PM   Specimen: BLOOD  Result Value Ref Range Status   Specimen Description BLOOD BLOOD RIGHT HAND  Final   Special Requests   Final    AEROBIC BOTTLE ONLY Blood Culture results may not be optimal due to an inadequate volume of blood received in culture bottles   Culture   Final    NO GROWTH 5 DAYS Performed at [facility], [address] [place], [place] [address]    Report Status 04/13/2024 FINAL  Final  MRSA Next Gen by PCR, Nasal     Status: None   Collection Time: 04/08/24 10:00 PM   Specimen: Nasal Mucosa; Nasal Swab  Result Value Ref Range Status   MRSA by PCR Next Gen NOT DETECTED NOT DETECTED Final    Comment: (NOTE) The GeneXpert MRSA Assay (FDA approved for NASAL specimens only), is one component of a comprehensive MRSA colonization surveillance program. It is not intended to diagnose MRSA infection nor to guide or monitor treatment for MRSA infections. Test performance is not FDA approved in patients less than 1 years old. Performed at [facility], [address], [place], [place] [address]      Labs: BNP (last 3 results) Recent Labs    04/13/24 0928  BNP 93.7   Basic Metabolic Panel: Recent Labs  Lab 04/09/24 0257  04/09/24 1134 04/10/24 0221 04/11/24 0221 04/12/24 1144 04/13/24 0500 04/13/24 1030  NA 133*  --  137 138 138 135  --   K 3.8  --  3.9 3.7 4.0 3.5  --   CL 102  --  106 108 107 104  --   CO2  21*  --  22 21* 21* 21*  --   GLUCOSE 87  --  93 129* 107* 98  --   BUN 16  --  15 15 22 22   --   CREATININE 1.34*  --  1.17 1.10 1.03 0.98  --   CALCIUM  8.9  --  9.7 9.8 9.7 9.5  --   MG  --  1.7 2.2  --   --   --  1.9   Liver Function Tests: Recent Labs  Lab 04/08/24 1531 04/12/24 1144  AST 27 44*  ALT 25 26  ALKPHOS 72 59  BILITOT 1.2 0.7  PROT 7.2 6.3*  ALBUMIN 3.2* 2.8*   No results for input(s): LIPASE, AMYLASE in the last 168 hours. No results for input(s): AMMONIA in the last 168 hours. CBC: Recent Labs  Lab 04/08/24 1531 04/09/24 0257 04/10/24 0221 04/11/24 0221 04/12/24 1144 04/13/24 0500  WBC 16.7* 13.2* 13.1* 5.0 5.7 5.8  NEUTROABS 15.0* 12.0* 10.7*  --   --   --   HGB 14.5 12.4* 11.9* 12.6* 12.8* 13.0  HCT 44.1 36.9* 35.7* 37.2* 37.9* 39.2  MCV 88.9 87.9 88.8 86.7 87.7 88.1  PLT 155 138* 149* 196 221 212   Cardiac Enzymes: No results for input(s): CKTOTAL, CKMB, CKMBINDEX, TROPONINI in the last 168 hours. BNP: Invalid input(s): POCBNP CBG: No results for input(s): GLUCAP in the last 168 hours. D-Dimer No results for input(s): DDIMER in the last 72 hours. Hgb A1c No results for input(s): HGBA1C in the last 72 hours. Lipid Profile No results for input(s): CHOL, HDL, LDLCALC, TRIG, CHOLHDL, LDLDIRECT in the last 72 hours. Thyroid  function studies No results for input(s): TSH, T4TOTAL, T3FREE, THYROIDAB in the last 72 hours.  Invalid input(s): FREET3 Anemia work up No results for input(s): VITAMINB12, FOLATE, FERRITIN, TIBC, IRON, RETICCTPCT in the last 72 hours. Urinalysis    Component Value Date/Time   COLORURINE STRAW (A) 07/25/2023 0602   APPEARANCEUR CLEAR 07/25/2023 0602   LABSPEC 1.034 (H)  07/25/2023 0602   PHURINE 6.0 07/25/2023 0602   GLUCOSEU NEGATIVE 07/25/2023 0602   HGBUR MODERATE (A) 07/25/2023 0602   BILIRUBINUR NEGATIVE 07/25/2023 0602   BILIRUBINUR neg 01/16/2021 1657   KETONESUR NEGATIVE 07/25/2023 0602   PROTEINUR NEGATIVE 07/25/2023 0602   UROBILINOGEN 2.0 (A) 01/16/2021 1657   NITRITE NEGATIVE 07/25/2023 0602   LEUKOCYTESUR NEGATIVE 07/25/2023 0602   Sepsis Labs Recent Labs  Lab 04/10/24 0221 04/11/24 0221 04/12/24 1144 04/13/24 0500  WBC 13.1* 5.0 5.7 5.8   Microbiology Recent Results (from the past 240 hours)  Resp panel by RT-PCR (RSV, Flu A&B, Covid) Anterior Nasal Swab     Status: None   Collection Time: 04/08/24  3:32 PM   Specimen: Anterior Nasal Swab  Result Value Ref Range Status   SARS Coronavirus 2 by RT PCR NEGATIVE NEGATIVE Final   Influenza A by PCR NEGATIVE NEGATIVE Final   Influenza B by PCR NEGATIVE NEGATIVE Final    Comment: (NOTE) The Xpert Xpress SARS-CoV-2/FLU/RSV plus assay is intended as an aid in the diagnosis of influenza from Nasopharyngeal swab specimens and should not be used as a sole basis for treatment. Nasal washings and aspirates are unacceptable for Xpert Xpress SARS-CoV-2/FLU/RSV testing.  Fact Sheet for Patients: BloggerCourse.com  Fact Sheet for Healthcare Providers: SeriousBroker.it  This test is not yet approved or cleared by the [place]  FDA and has been authorized for detection and/or diagnosis of SARS-CoV-2 by FDA under an Emergency Use  Authorization (EUA). This EUA will remain in effect (meaning this test can be used) for the duration of the COVID-19 declaration under Section 564(b)(1) of the Act, 21 U.S.C. section 360bbb-3(b)(1), unless the authorization is terminated or revoked.     Resp Syncytial Virus by PCR NEGATIVE NEGATIVE Final    Comment: (NOTE) Fact Sheet for Patients: BloggerCourse.com  Fact Sheet for  Healthcare Providers: SeriousBroker.it  This test is not yet approved or cleared by the [place]  FDA and has been authorized for detection and/or diagnosis of SARS-CoV-2 by FDA under an Emergency Use Authorization (EUA). This EUA will remain in effect (meaning this test can be used) for the duration of the COVID-19 declaration under Section 564(b)(1) of the Act, 21 U.S.C. section 360bbb-3(b)(1), unless the authorization is terminated or revoked.  Performed at [facility], [address], [place], [place] [address]   Blood culture (routine x 2)     Status: None   Collection Time: 04/08/24  5:36 PM   Specimen: BLOOD  Result Value Ref Range Status   Specimen Description BLOOD RIGHT ANTECUBITAL  Final   Special Requests   Final    BOTTLES DRAWN AEROBIC AND ANAEROBIC Blood Culture adequate volume   Culture   Final    NO GROWTH 5 DAYS Performed at [facility], [address], [place], [place] [address]    Report Status 04/13/2024 FINAL  Final  Blood culture (routine x 2)     Status: None   Collection Time: 04/08/24  5:41 PM   Specimen: BLOOD  Result Value Ref Range Status   Specimen Description BLOOD BLOOD RIGHT HAND  Final   Special Requests   Final    AEROBIC BOTTLE ONLY Blood Culture results may not be optimal due to an inadequate volume of blood received in culture bottles   Culture   Final    NO GROWTH 5 DAYS Performed at [facility], [address], [place], [place] [address]    Report Status 04/13/2024 FINAL  Final  MRSA Next Gen by PCR, Nasal     Status: None   Collection Time: 04/08/24 10:00 PM   Specimen: Nasal Mucosa; Nasal Swab  Result Value Ref Range Status   MRSA by PCR Next Gen NOT DETECTED NOT DETECTED Final    Comment: (NOTE) The GeneXpert MRSA Assay (FDA approved for NASAL specimens only), is one component of a comprehensive MRSA colonization surveillance program. It is not intended to diagnose MRSA infection  nor to guide or monitor treatment for MRSA infections. Test performance is not FDA approved in patients less than 27 years old. Performed at [facility], [address], [place], [place] [address]      Time coordinating discharge: 39 min  SIGNED:   Almarie KANDICE Hoots, MD  [facility] 04/14/2024, 10:48 AM

## 2024-04-14 NOTE — TOC Transition Note (Signed)
 Transition of Care [facility]) - Discharge Note   Patient Details  Name: [name] MRN: [id number] Date of Birth: Apr 01, 1946  Transition of Care [facility]) CM/SW Contact:  Corean JAYSON Canary, RN Phone Number: 04/14/2024, 4:21 PM   Clinical Narrative:     Patient will be discharging this PM no needs identified  Final next level of care: Home/Self Care Barriers to Discharge: No Barriers Identified   Patient Goals and CMS Choice Patient states their goals for this hospitalization and ongoing recovery are:: to remain at home getting stronger everyday          Discharge Placement                       Discharge Plan and Services Additional resources added to the After Visit Summary for                    DME Agency: NA       HH Arranged: NA          Social Drivers of Health (SDOH) Interventions SDOH Screenings   Food Insecurity: No Food Insecurity (04/09/2024)  Housing: Unknown (04/11/2024)  Transportation Needs: No Transportation Needs (04/09/2024)  Utilities: Not At Risk (04/09/2024)  Alcohol Screen: Low Risk  (12/02/2021)  Depression (PHQ2-9): Low Risk  (05/16/2023)  Financial Resource Strain: Low Risk  (12/02/2021)  Physical Activity: Insufficiently Active (12/02/2021)  Social Connections: Moderately Integrated (04/09/2024)  Stress: No Stress Concern Present (12/02/2021)  Tobacco Use: Low Risk  (04/12/2024)     Readmission Risk Interventions    04/10/2024    4:17 PM  Readmission Risk Prevention Plan  Post Dischage Appt Complete  Medication Screening Complete  Transportation Screening Complete

## 2024-04-14 NOTE — Progress Notes (Signed)
 Arrived to find the PT walking around and greeting paramedic at the door. PT stated that he was feeling good while ambulating and denied any dizziness. PT stated that earlier he had taken a shower and when he got out and started walking he became short of breath for a few breaths. PT stated that he slept very well last night, he only had to change his pad a couple of times throughout the night.   PT's vitals were obtained and documented accordingly. Manual BP was obtained. Physical exam showed negative DCAPBTLS to the head, neck, or face. PERRLA. Negative JVD or tracheal deviation. Negative stridor. L/S were C/E in all fields. Chest expansion is equal with non-labored breathing. ABD is soft, non-tender. LBM was 04/14/24 and reports that it was solid. Denied any dysuria, hematuria or polyuria. Edema in right leg is +2 pitting edema. Left leg is +1 pitting edema.   All medications were administered as ordered. PT had virtual meeting with MD Alvia and discussed pending admission in the PM. 2 blood draw attempts were performed by paramedic Joella Saefong. Paramedic Johann went out and obtained lab blood draw.

## 2024-04-14 NOTE — Progress Notes (Signed)
 Discharge AVS reviewed with pt via team virtually. Pt voiced understanding,able to speak to RN about changes to meds and what meds to take. Understands follow up as well. Pt voiced appreciation for all staff. All questions answered.

## 2024-04-14 NOTE — Progress Notes (Signed)
 Received call from patient. He checked his blood pressure this am with automatic cuff and was 134/81. HIs heart rate was 78. Pt's baseline heart rate is in the 50's. Informed patient of paramedic visit due 05-1029 am , and that I would inform MD for any changes in medications. Pt encouraged to call if any needs. Pt states he feels good this am and had a restful night.

## 2024-04-14 NOTE — Progress Notes (Signed)
 AM virtual rounds with RN and MD Will completed with Orie Hyacinth hind person). Pt with no complaints. Blood pressure 150/80 and heart rate 60. Md Will reviewed POC and med changes with patient. Pt voiced understanding. Anticipate discharge this afternoon pending labs. All questions answered. Pt expressed extreme happiness with the program and the staff

## 2024-04-14 NOTE — Plan of Care (Signed)
  Problem: Education: Goal: Knowledge of General Education information will improve Description: Including pain rating scale, medication(s)/side effects and non-pharmacologic comfort measures Outcome: Adequate for Discharge   Problem: Health Behavior/Discharge Planning: Goal: Ability to manage health-related needs will improve Outcome: Adequate for Discharge   Problem: Clinical Measurements: Goal: Ability to maintain clinical measurements within normal limits will improve Outcome: Adequate for Discharge Goal: Will remain free from infection Outcome: Adequate for Discharge Goal: Diagnostic test results will improve Outcome: Adequate for Discharge Goal: Respiratory complications will improve Outcome: Adequate for Discharge Goal: Cardiovascular complication will be avoided Outcome: Adequate for Discharge   Problem: Activity: Goal: Risk for activity intolerance will decrease Outcome: Adequate for Discharge   Problem: Elimination: Goal: Will not experience complications related to bowel motility Outcome: Adequate for Discharge Goal: Will not experience complications related to urinary retention Outcome: Adequate for Discharge   Problem: Pain Managment: Goal: General experience of comfort will improve and/or be controlled Outcome: Adequate for Discharge   Problem: Safety: Goal: Ability to remain free from injury will improve Outcome: Adequate for Discharge   Problem: Skin Integrity: Goal: Risk for impaired skin integrity will decrease Outcome: Adequate for Discharge   Problem: Activity: Goal: Ability to tolerate increased activity will improve Outcome: Adequate for Discharge   Problem: Clinical Measurements: Goal: Ability to maintain a body temperature in the normal range will improve Outcome: Adequate for Discharge   Problem: Respiratory: Goal: Ability to maintain adequate ventilation will improve Outcome: Adequate for Discharge Goal: Ability to maintain a clear  airway will improve Outcome: Adequate for Discharge

## 2024-04-16 ENCOUNTER — Telehealth: Payer: Self-pay

## 2024-04-16 NOTE — Patient Instructions (Signed)
 Visit Information  Thank you for taking time to visit with me today. Please don't hesitate to contact me if I can be of assistance to you before our next scheduled telephone appointment.  Our next appointment is by telephone on 04/24/24 at 11:00 AM  Following is a copy of your care plan:   Goals Addressed             This Visit's Progress    VBCI Transitions of Care (TOC) Care Plan       Problems:  Recent Hospitalization for treatment of Allergic Rhinitis and Pulmonary Disease Community Acquired Pneumonia No Hospital Follow Up Provider appointment Offered appointment assistance patient states he would prefer to make his own appointments for PCP and Specialist  Goal:  Over the next 30 days, the patient will not experience hospital readmission  Interventions:  Transitions of Care: Doctor Visits  - discussed the importance of doctor visits Post discharge activity limitations prescribed by provider reviewed  Patient Self Care Activities:  Attend all scheduled provider appointments Call pharmacy for medication refills 3-7 days in advance of running out of medications Call provider office for new concerns or questions  Notify RN Care Manager of TOC call rescheduling needs Participate in Transition of Care Program/Attend TOC scheduled calls Take medications as prescribed    Plan:  The care management team will reach out to the patient again over the next 5- 10 business days. The patient has been provided with contact information for the care management team and has been advised to call with any health related questions or concerns.  The patient will call PCP as advised to any worsening respiratory symptoms, shortness of breath at rest, increase cough with sputum with changes in consistency and color.         Patient verbalizes understanding of instructions and care plan provided today and agrees to view in MyChart. Active MyChart status and patient understanding of how to access  instructions and care plan via MyChart confirmed with patient.     Telephone follow up appointment with care management team member scheduled for: The care management team will reach out to the patient again over the next 5- 10 business days.  The patient will call PCP* as advised to worsening of symptoms, Shortness of breath at rest, fever, etc.  Follow up with provider re: Patient desires to call for Hospital follow up for himself  Please call the care guide team at [phone] if you need to cancel or reschedule your appointment.   Please call the [place]  National Suicide Prevention Lifeline: 216-850-3263 or TTY: 9724454366 TTY (862) 291-3072) to talk to a trained counselor if you are experiencing a Mental Health or Behavioral Health Crisis or need someone to talk to.  Richerd Fish, RN, BSN, CCM [facility], [facility] RN Care Manager Direct Dial: [phone]      '

## 2024-04-16 NOTE — Transitions of Care (Post Inpatient/ED Visit) (Signed)
 04/16/2024  Name: [name] MRN: [id number] DOB: 09/21/1945  Today's TOC FU Call Status: Today's TOC FU Call Status:: Successful TOC FU Call Completed TOC FU Call Complete Date: 04/16/24 Patient's Name and Date of Birth confirmed.  Transition Care Management Follow-up Telephone Call Date of Discharge: 04/14/24 Discharge Facility: [facility]) Name of Other (Non-[facility]) Discharge Facility: [facility] Type of Discharge: Inpatient Admission Primary Inpatient Discharge Diagnosis:: Community Acquired Pneumonia; RLL How have you been since you were released from the hospital?: Better Any questions or concerns?: No  Items Reviewed: Did you receive and understand the discharge instructions provided?: Yes Medications obtained,verified, and reconciled?: Yes (Medications Reviewed) Any new allergies since your discharge?: No Dietary orders reviewed?: Yes Type of Diet Ordered:: Heart Health low sodium Do you have support at home?: Yes People in Home [RPT]: spouse Name of Support/Comfort Primary Source: [place]  Medications Reviewed Today: Medications Reviewed Today     Reviewed by Eilleen Richerd GRADE, RN (Registered Nurse) on 04/16/24 at 1010  Med List Status: <None>   Medication Order Taking? Sig Documenting Provider Last Dose Status Informant  allopurinol  (ZYLOPRIM ) 100 MG tablet [id number] Yes TAKE 1 TABLET(100 MG) BY MOUTH TWICE DAILY Webb, Padonda B, FNP  Active Self, Pharmacy Records  amLODipine  (NORVASC ) 5 MG tablet [id number] Yes Take 1 tablet (5 mg total) by mouth daily. Will Almarie MATSU, MD  Active   ascorbic acid  (VITAMIN C ) 500 MG tablet [id number] Yes Take 500 mg by mouth daily. [provider]  Active Self  aspirin  EC 81 MG tablet [id number] Yes Take 81 mg by mouth daily. Swallow whole. [provider]  Active Self, Pharmacy Records  carvedilol  (COREG ) 3.125 MG tablet [id number] Yes Take 1 tablet (3.125 mg total) by mouth 2 (two) times  daily. Will Almarie MATSU, MD  Active   cholecalciferol (VITAMIN D3) 25 MCG (1000 UNIT) tablet [id number] Yes Take 1,000 Units by mouth daily. [provider]  Active Self, Pharmacy Records  citalopram  (CELEXA ) 20 MG tablet [id number] Yes TAKE 1 TABLET(20 MG) BY MOUTH DAILY Thedora [name], MD  Active Self, Pharmacy Records  Coenzyme Q10 (COQ10 PO) [id number] Yes Take 1 capsule by mouth daily. [provider]  Active Self, Pharmacy Records  fluticasone  (FLONASE ) 50 MCG/ACT nasal spray [id number] Yes Place 1 spray into both nostrils daily. Will Almarie MATSU, MD  Active   folic acid  (FOLVITE ) 800 MCG tablet [id number] Yes Take 800 mcg by mouth daily. [provider]  Active Self, Pharmacy Records  HYDROcodone -Chlorpheniramine (CHLORPHENIRAMINE-HYDROCODONE  PO) [id number]  Take 10 mg/mL by mouth every 12 (twelve) hours. chlorpheniramine- HYDROcodone  10-8 MG/ 5ML Commonly known as: TUSSIONEX Take 5 mLs by mouth every 12 (twelve) hours as needed for [provider]  Active   levothyroxine  (SYNTHROID ) 88 MCG tablet [id number] Yes TAKE 1 TABLET(88 MCG) BY MOUTH DAILY BEFORE BREAKFAST Thedora [name], MD  Active Self, Pharmacy Records  loratadine  (CLARITIN ) 10 MG tablet [id number] Yes Take 1 tablet (10 mg total) by mouth daily. Will Almarie MATSU, MD  Active   predniSONE  (DELTASONE ) 20 MG tablet [id number] Yes Take 2 tablets daily for 5 days then stop Will Almarie MATSU, MD  Active   rosuvastatin  (CRESTOR ) 10 MG tablet [id number] Yes TAKE 1 TABLET(10 MG) BY MOUTH DAILY Thedora [name], MD  Active Self, Pharmacy Records            Home Care and Equipment/Supplies: Were Home Health Services Ordered?: No Any new equipment or medical supplies ordered?:  No  Functional Questionnaire: Do you need assistance with bathing/showering or dressing?: No Do you need assistance with meal preparation?: No Do you need assistance with eating?: No Do you have difficulty  maintaining continence: No Do you need assistance with getting out of bed/getting out of a chair/moving?: No Do you have difficulty managing or taking your medications?: No  Follow up appointments reviewed: PCP Follow-up appointment confirmed?: No (Patient was offered Care Guide assistance but states, I prefer to get the appointment myself) Specialist Hospital Follow-up appointment confirmed?: No Reason Specialist Follow-Up Not Confirmed: Patient has Specialist Provider Number and will Call for Appointment (Patient declined assistance -I prefer to call myself) Do you need transportation to your follow-up appointment?: No Do you understand care options if your condition(s) worsen?: Yes-patient verbalized understanding  SDOH Interventions Today    Flowsheet Row Most Recent Value  SDOH Interventions   Food Insecurity Interventions Intervention Not Indicated  Housing Interventions Intervention Not Indicated  Transportation Interventions Intervention Not Indicated  Utilities Interventions Intervention Not Indicated    Goals Addressed             This Visit's Progress    VBCI Transitions of Care (TOC) Care Plan       Problems:  Recent Hospitalization for treatment of Allergic Rhinitis and Pulmonary Disease Community Acquired Pneumonia No Hospital Follow Up Provider appointment Offered appointment assistance patient states he would prefer to make his own appointments for PCP and Specialist  Goal:  Over the next 30 days, the patient will not experience hospital readmission  Interventions:  Transitions of Care: Doctor Visits  - discussed the importance of doctor visits Post discharge activity limitations prescribed by provider reviewed  Patient Self Care Activities:  Attend all scheduled provider appointments Call pharmacy for medication refills 3-7 days in advance of running out of medications Call provider office for new concerns or questions  Notify RN Care Manager of TOC  call rescheduling needs Participate in Transition of Care Program/Attend TOC scheduled calls Take medications as prescribed    Plan:  The care management team will reach out to the patient again over the next 5- 10 business days. The patient has been provided with contact information for the care management team and has been advised to call with any health related questions or concerns.  The patient will call PCP as advised to any worsening respiratory symptoms, shortness of breath at rest, increase cough with sputum with changes in consistency and color.          Richerd Fish, RN, BSN, CCM [facility], [facility] RN Care Manager Direct Dial: [phone]

## 2024-04-17 ENCOUNTER — Ambulatory Visit (INDEPENDENT_AMBULATORY_CARE_PROVIDER_SITE_OTHER): Admitting: Internal Medicine

## 2024-04-17 ENCOUNTER — Encounter: Payer: Self-pay | Admitting: Internal Medicine

## 2024-04-17 VITALS — BP 106/60 | HR 56 | Temp 98.3°F | Ht 72.0 in | Wt 244.0 lb

## 2024-04-17 DIAGNOSIS — J189 Pneumonia, unspecified organism: Secondary | ICD-10-CM

## 2024-04-17 DIAGNOSIS — R0602 Shortness of breath: Secondary | ICD-10-CM

## 2024-04-17 MED ORDER — GUAIFENESIN 200 MG PO TABS: 200.0000 mg | ORAL_TABLET | ORAL | 0 refills | Status: AC | PRN

## 2024-04-17 MED ORDER — ALBUTEROL SULFATE (2.5 MG/3ML) 0.083% IN NEBU
2.5000 mg | INHALATION_SOLUTION | Freq: Once | RESPIRATORY_TRACT | Status: AC
  Administered 2024-04-17: 2.5 mg via RESPIRATORY_TRACT

## 2024-04-17 MED ORDER — ALBUTEROL SULFATE HFA 108 (90 BASE) MCG/ACT IN AERS: 2.0000 | INHALATION_SPRAY | Freq: Four times a day (QID) | RESPIRATORY_TRACT | 1 refills | Status: AC | PRN

## 2024-04-17 NOTE — Patient Instructions (Addendum)
 It was a pleasure to see you today!  Please schedule follow up with APP in 6-8 weeks.  If my schedule is not open yet, we will contact you with a reminder closer to that time. Please call [phone] if you haven't heard from us  a month before, and always call us  sooner if issues or concerns arise. You can also send us  a message through MyChart, but but aware that this is not to be used for urgent issues and it may take up to 5-7 days to receive a reply. Please be aware that you will likely be able to view your results before I have a chance to respond to them. Please give us  5 business days to respond to any non-urgent results.    Before your next visit I would like you to have: Chest xray before the next visit  -PNEUMONIA: Pneumonia is an infection that inflames the air sacs in one or both lungs. You are currently recovering from pneumonia and experiencing a persistent dry cough and shortness of breath. You will be prescribed mucolytics to help break up mucus, and a nebulizer treatment was administered during your visit. A follow-up chest x-ray will be ordered in six to eight weeks, and you will have a follow-up appointment with a nurse practitioner around the same time. An inhaler will also be prescribed to help with shortness of breath. Continue using the flutter valve to help break up mucus.   Take the albuterol  rescue inhaler every 4 to 6 hours as needed for wheezing or shortness of breath. You can also take it 15 minutes before exercise or exertional activity. Side effects include heart racing or pounding, jitters or anxiety. If you have a history of an irregular heart rhythm, it can make this worse. Can also give some patients a hard time sleeping.  To inhale the aerosol using an inhaler, follow these steps:  Remove the protective dust cap from the end of the mouthpiece. If the dust cap was not placed on the mouthpiece, check the mouthpiece for dirt or other objects. Be sure that the  canister is fully and firmly inserted in the mouthpiece. 2. If you are using the inhaler for the first time or if you have not used the inhaler in more than 14 days, you will need to prime it. You may also need to prime the inhaler if it has been dropped. Ask your pharmacist or check the manufacturer's information if this happens. To prime the inhaler, shake it well and then press down on the canister 4 times to release 4 sprays into the air, away from your face. Be careful not to get albuterol  in your eyes. 3. Shake the inhaler well. 4. Breathe out as completely as possible through your mouth. 4. Hold the canister with the mouthpiece on the bottom, facing you and the canister pointing upward. Place the open end of the mouthpiece into your mouth. Close your lips tightly around the mouthpiece. 6. Breathe in slowly and deeply through the mouthpiece.At the same time, press down once on the container to spray the medication into your mouth. 7. Try to hold your breath for 10 seconds. remove the inhaler, and breathe out slowly. 8. If you were told to use 2 puffs, wait 1 minute and then repeat steps 3-7. 9. Replace the protective cap on the inhaler. 10. Clean your inhaler regularly. Follow the manufacturer's directions carefully and ask your doctor or pharmacist if you have any questions about cleaning your inhaler.  Check  the back of the inhaler to keep track of the total number of doses left on the inhaler.

## 2024-04-17 NOTE — Progress Notes (Signed)
 [name]    [id number]    Jul 31, 1946  Primary Care Physician:[name], [name], MD  Referring Physician: Thedora [name], MD [address] [place],  [place] [address] Reason for Consultation:  Date of Consultation: 04/17/2024  Chief complaint:   Chief Complaint  Patient presents with   Consult    Hospitalized for 3 days with pneumonia (8/10). Currently having sob with activity.     HPI:  Discussed the use of AI scribe software for clinical note transcription with the patient, who gave verbal consent to proceed.  History of Present Illness [name] is a 78 year old male who presents with recurrent pneumonia and persistent cough. He was referred by [facility] for follow-up after recent hospitalization for pneumonia.  Approximately eight weeks ago He was treated for pneumonia by Dr Thedora his PCP with outpatient antibitoics.  He is unsure if the initial pneumonia ever fully resolved.  His son encouraged him to go to the hospital last Sunday, where he was diagnosed with pneumonia after an examination and x-rays. He was admitted for three days and received fluids and antibiotics before being discharged to a [facility] program, which he found to be a positive experience.  He completed 5 days ceftriaxone /azithromycinHe was also given a beta blocker, Coreg , at a lower dose, which he believes he is still taking. No palpitations or fast heart rate during his illness.  He continues to experience a persistent dry cough, which he feels would improve if he could 'break up' and expel the mucus. He is not currently taking any medication for the cough but is using a flutter valve and an incentive spirometer to help with mucus clearance. No fevers, chills, or postnasal drainage, and his appetite is good. He does have some ongoing shortness of breath.   He lives with his wife and son, and has a cat. His wife smoked for about twelve years after he was married. 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   Social history:  Occupation: Transport planner Exposures: lives at home with wife and son, Emergency planning/management officer.  Smoking history: passive smoke exposure 12 years in wife. Never smoker.   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  Tobacco Use   Smoking status: Never    Passive exposure: Never   Smokeless tobacco: Never  Vaping Use   Vaping status: Former   Start date: 08/01/2017   Substances: THC  Substance and Sexual Activity   Alcohol use: Yes    Alcohol/week: 2.0 standard drinks of alcohol    Types: 2 Standard drinks or equivalent per week    Comment: rarely   Drug use: Not Currently    Types: Marijuana    Comment: none in at least 1 year   Sexual activity: Yes    Partners: Female    Relevant family history:  Family History  Problem Relation Age of Onset   Cerebral aneurysm Mother    Heart disease Father    Cancer Neg Hx     Past Medical History:  Diagnosis Date   Arthritis    knees. back   Bladder cancer (HCC)    2018   High cholesterol    Hypertension    Hypothyroidism    prostate cancer    2000, 2003   Thyroid  disease     Past Surgical History:  Procedure Laterality Date  EYE SURGERY Bilateral 2014   HERNIA REPAIR Right 1984   inguinal   IR RADIOLOGIST EVAL & MGMT  03/19/2021   KNEE ARTHROSCOPY Left 1966   LUMBAR LAMINECTOMY/DECOMPRESSION MICRODISCECTOMY N/A 05/13/2022   Procedure: Microlumbar laminectomy Lumbar three-four , Lumbar four-five;  Surgeon: Duwayne Purchase, MD;  Location: [facility] OR;  Service: Orthopedics;  Laterality: N/A;   PENILE PROSTHESIS IMPLANT  2014   TOTAL KNEE ARTHROPLASTY Right 08/11/2020   Procedure: TOTAL KNEE ARTHROPLASTY;  Surgeon: Melodi Lerner, MD;  Location: [facility] ORS;  Service: Orthopedics;  Laterality: Right;    TRANSURETHRAL RESECTION OF BLADDER TUMOR  2018     Physical Exam: Blood pressure 106/60, pulse (!) 56, temperature  98.3 F (36.8 C), temperature source Oral, height 6' (1.829 m), weight 244 lb (110.7 kg), SpO2 95%. Gen:      No acute distress ENT:  no nasal polyps, mucus membranes moist Lungs:    No increased respiratory effort, symmetric chest wall excursion, clear to auscultation bilaterally, no wheezes,  occasional rhonchi that clear with coughing CV:         Regular rate and rhythm; no murmurs, rubs, or gallops.  No pedal edema Abd:      + bowel sounds; soft, non-tender; no distension MSK: no acute synovitis of DIP or PIP joints, no mechanics hands.  Skin:      Warm and dry; no rashes Neuro: normal speech, no focal facial asymmetry Psych: alert and oriented x3, normal mood and affect   Data Reviewed/Medical Decision Making:  Independent interpretation of tests: Imaging:  Review of patient's CT Chest  images August 2025 revealed LLL dense consolidation consistent with pneumonia. The patient's images have been independently reviewed by me.      Labs:  Lab Results  Component Value Date   NA 138 04/14/2024   K 3.2 (L) 04/14/2024   CO2 25 04/14/2024   GLUCOSE 103 (H) 04/14/2024   BUN 19 04/14/2024   CREATININE 1.08 04/14/2024   CALCIUM  10.1 04/14/2024   GFR 70.00 09/30/2023   EGFR 71 02/23/2021   GFRNONAA >60 04/14/2024   Lab Results  Component Value Date   WBC 5.8 04/13/2024   HGB 13.0 04/13/2024   HCT 39.2 04/13/2024   MCV 88.1 04/13/2024   PLT 212 04/13/2024   Covid, flu, rsv negative   Immunization status:  Immunization History  Administered Date(s) Administered   Fluad Quad(high Dose 65+) 05/03/2019, 07/05/2022   Fluad Trivalent(High Dose 65+) 05/16/2023   Influenza-Unspecified 09/05/2020, 06/16/2021   PFIZER(Purple Top)SARS-COV-2 Vaccination 10/06/2019, 10/31/2019, 09/05/2020   PNEUMOCOCCAL CONJUGATE-20 07/15/2021     I reviewed prior external note(s) from hospital stay  I reviewed the result(s) of the labs and imaging as noted above.   I have ordered chest  xray   Assessment and Plan Assessment & Plan Pneumonia, currently recovering with persistent cough and shortness of breath Recovering from pneumonia with persistent dry cough and shortness of breath. Previously treated with antibiotics. Possible incomplete resolution of previous infection. Low risk for recurrent pneumonia; age is primary risk factor. Full recovery anticipated in two to three weeks. - Prescribe mucolytics up to four times a day. - Order follow-up chest x-ray in six to eight weeks. - Arrange follow-up appointment with nurse practitioner in six to eight weeks, with chest x-ray prior. - Administer nebulizer treatment during visit. - Prescribe albuterol  inhaler for shortness of breath.   Return to Care: Return in about 6 weeks (around 05/29/2024) for APP visit with xray before.  Verdon  Meade, MD Pulmonary and Critical Care Medicine [facility] Office:[phone]  CC: Thedora [name], MD

## 2024-04-24 ENCOUNTER — Telehealth: Payer: Self-pay

## 2024-04-26 ENCOUNTER — Telehealth: Payer: Self-pay

## 2024-04-26 NOTE — Transitions of Care (Post Inpatient/ED Visit) (Signed)
 Care Management  Transitions of Care Program Transitions of Care Post-discharge week 2  04/26/2024 Name: [name] MRN: [id number] DOB: 28-Nov-1945  Subjective: [name] is a 78 y.o. year old male who is a primary care patient of [name], [name], MD. The Care Management team was unable to reach the patient by phone to assess and address transitions of care needs. Left a HIPAA approved voicemail message to phone number provided in demographics per DPR. Requested a return call and information provided.   Plan: Additional outreach attempts will be made to reach the patient enrolled in the [facility] Program (Post Inpatient/ED Visit).  Richerd Fish, RN, BSN, CCM [facility], [facility] RN Care Manager Direct Dial: [phone]

## 2024-04-27 ENCOUNTER — Telehealth: Payer: Self-pay

## 2024-04-27 NOTE — Transitions of Care (Post Inpatient/ED Visit) (Signed)
 Care Management  Transitions of Care Program Transitions of Care Post-discharge week 2  04/27/2024 Name: [name] MRN: [id number] DOB: 14-Nov-1945  Subjective: [name] is a 78 y.o. year old male who is a primary care patient of [name], [name], MD. The Care Management team was unable to reach the patient by phone to assess and address transitions of care needs.   Plan: No further outreach attempts will be made at this time.  We have been unable to reach the patient. 3rd call attempt made left voicemail message  Richerd Fish, RN, BSN, CCM [facility], [facility] RN Care Manager Direct Dial: [phone]

## 2024-04-27 NOTE — Transitions of Care (Post Inpatient/ED Visit) (Deleted)
   04/27/2024  Name: [name] MRN: [id number] DOB: 03-17-1946  {[id number]}

## 2024-04-27 NOTE — Transitions of Care (Post Inpatient/ED Visit) (Deleted)
 Care Management  Transitions of Care Program Transitions of Care Post-discharge week 2  04/27/2024 Name: [name] MRN: [id number] DOB: [birth year]  Subjective: [name] is a 78 y.o. year old male who is a primary care patient of [name], [name], MD. The Care Management team was unable to reach the patient by phone to assess and address transitions of care needs.   Plan: No further outreach attempts will be made at this time.  We have been unable to reach the patient.  [name] Fish, RN, BSN, CCM [facility], [facility] RN Care Manager Direct Dial: [phone]

## 2024-05-03 ENCOUNTER — Ambulatory Visit (INDEPENDENT_AMBULATORY_CARE_PROVIDER_SITE_OTHER): Admitting: Internal Medicine

## 2024-05-03 ENCOUNTER — Encounter: Payer: Self-pay | Admitting: Internal Medicine

## 2024-05-03 VITALS — BP 112/62 | HR 54 | Temp 98.3°F | Ht 72.0 in | Wt 244.8 lb

## 2024-05-03 DIAGNOSIS — I10 Essential (primary) hypertension: Secondary | ICD-10-CM | POA: Diagnosis not present

## 2024-05-03 DIAGNOSIS — E876 Hypokalemia: Secondary | ICD-10-CM

## 2024-05-03 DIAGNOSIS — J189 Pneumonia, unspecified organism: Secondary | ICD-10-CM | POA: Diagnosis not present

## 2024-05-03 DIAGNOSIS — R001 Bradycardia, unspecified: Secondary | ICD-10-CM

## 2024-05-03 DIAGNOSIS — N179 Acute kidney failure, unspecified: Secondary | ICD-10-CM | POA: Diagnosis not present

## 2024-05-03 DIAGNOSIS — R29898 Other symptoms and signs involving the musculoskeletal system: Secondary | ICD-10-CM

## 2024-05-03 LAB — BASIC METABOLIC PANEL WITH GFR
BUN: 19 mg/dL (ref 6–23)
CO2: 26 meq/L (ref 19–32)
Calcium: 9.7 mg/dL (ref 8.4–10.5)
Chloride: 105 meq/L (ref 96–112)
Creatinine, Ser: 1.11 mg/dL (ref 0.40–1.50)
GFR: 63.72 mL/min (ref >60.00)
Glucose, Bld: 114 mg/dL — ABNORMAL HIGH (ref 70–99)
Potassium: 4.1 meq/L (ref 3.5–5.1)
Sodium: 139 meq/L (ref 135–145)

## 2024-05-03 LAB — CBC WITH DIFFERENTIAL/PLATELET
Basophils Absolute: 0 K/uL (ref 0.0–0.1)
Basophils Relative: 0.7 % (ref 0.0–3.0)
Eosinophils Absolute: 0.2 K/uL (ref 0.0–0.7)
Eosinophils Relative: 4.3 % (ref 0.0–5.0)
HCT: 40.3 % (ref 39.0–52.0)
Hemoglobin: 13.1 g/dL (ref 13.0–17.0)
Lymphocytes Relative: 32.6 % (ref 12.0–46.0)
Lymphs Abs: 1.1 K/uL (ref 0.7–4.0)
MCHC: 32.6 g/dL (ref 30.0–36.0)
MCV: 89.5 fl (ref 78.0–100.0)
Monocytes Absolute: 0.3 K/uL (ref 0.1–1.0)
Monocytes Relative: 9.5 % (ref 3.0–12.0)
Neutro Abs: 1.9 K/uL (ref 1.4–7.7)
Neutrophils Relative %: 52.9 % (ref 43.0–77.0)
Platelets: 149 K/uL — ABNORMAL LOW (ref 150.0–400.0)
RBC: 4.51 Mil/uL (ref 4.22–5.81)
RDW: 16.6 % — ABNORMAL HIGH (ref 11.5–15.5)
WBC: 3.5 K/uL — ABNORMAL LOW (ref 4.0–10.5)

## 2024-05-03 NOTE — Patient Instructions (Signed)
 Chest xray on or after September 30th , 2025

## 2024-05-03 NOTE — Progress Notes (Signed)
 [facility] [address] [place] [address] Dept: [phone] Dept Fax: [phone]    Subjective:   [name] 11/12/1945 05/03/2024  Chief Complaint  Patient presents with   Hospitalization Follow-up    Feeling better    Referral    Physical therapy     HPI: [name] presents today for re-assessment and management of chronic medical conditions.  Discussed the use of AI scribe software for clinical note transcription with the patient, who gave verbal consent to proceed.  History of Present Illness   [name] is a 78 year old male who presents for hospital follow-up after treatment for pneumonia.  He was admitted to the hospital on April 08, 2024, and discharged on April 14, 2024, for pneumonia in the left lower lobe. He was treated with Rocephin  and azithromycin  for five days. COVID, flu, and RSV tests were negative. During his hospital stay, he experienced an acute kidney injury, which resolved with IV fluids. He was also bradycardic and asymptomatic while taking Coreg  25 mg once daily, which was held during his hospital stay. He was discharged on Norvasc  5 mg daily and Coreg  3.125 mg twice daily.  He experienced hypokalemia with a potassium level of 3.2 during his hospital stay and was given 40 mEq of potassium. The hospital recommended follow-up with his primary care provider for a CBC and BMP.  Since discharge, he feels 'really good', although it took almost a week to feel like himself again. Initially, he worked half days until he regained more strength. He is experiencing weakness in his legs, which he attributes to being laid up since the hospitalization and over the past year and a half due to various health issues, including a back problem that required surgery and episodes of sepsis. He also had an abscess in his chest that was initially suspected to be a tumor, which was drained, leading to improvement  in his condition.  He has an appointment scheduled with pulmonology for follow-up of a chronic cough, and a follow-up chest x-ray has been ordered by pulmonology to assess pneumonia resolution. He reports shortness of breath upon exertion, but this is slowly improving. No fever.    He is concerned about the weakness in his legs and the risk of falling, and he is interested in physical therapy to improve his leg strength.    The following portions of the patient's history were reviewed and updated as appropriate: past medical history, past surgical history, family history, social history, allergies, medications, and problem list.   Patient Active Problem List   Diagnosis Date Noted   Cough 04/12/2024   Bradycardia 04/12/2024   GERD (gastroesophageal reflux disease) 04/12/2024   Hyponatremia 04/11/2024   Mild aortic stenosis 04/11/2024   AKI (acute kidney injury) (HCC) 04/09/2024   CAP (community acquired pneumonia) 04/08/2024   Pneumonia of right lower lobe due to infectious organism 01/31/2024   Allergic rhinitis 05/16/2023   Dermatitis- right elbow 05/16/2023   Peripheral polyneuropathy 09/22/2022   Tremor of right hand 09/22/2022   History of pyelonephritis 07/15/2022   Anxiety and depression 07/15/2022   Dyspnea on exertion 07/15/2022   Sepsis (HCC) 07/01/2022   Spinal stenosis at L4-L5 level 05/13/2022   Nephrolithiasis 02/27/2021   Aortic atherosclerosis (HCC) 02/27/2021   Abscess of liver 02/25/2021   Prediabetes 11/18/2020   History of total knee replacement, right 08/18/2020   Essential hypertension 01/11/2020   Onychomycosis 10/12/2019   Hypothyroidism 10/12/2019   Hyperlipidemia  10/12/2019   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 back   Bladder  cancer (HCC)    2018   High cholesterol    Hypertension    Hypothyroidism    prostate cancer    2000, 2003   Thyroid  disease    Past Surgical History:  Procedure Laterality Date   EYE SURGERY Bilateral 2014   HERNIA REPAIR Right 1984   inguinal   IR RADIOLOGIST EVAL & MGMT  03/19/2021   KNEE ARTHROSCOPY Left 1966   LUMBAR LAMINECTOMY/DECOMPRESSION MICRODISCECTOMY N/A 05/13/2022   Procedure: Microlumbar laminectomy Lumbar three-four , Lumbar four-five;  Surgeon: Duwayne Purchase, MD;  Location: [facility] OR;  Service: Orthopedics;  Laterality: N/A;   PENILE PROSTHESIS IMPLANT  2014   TOTAL KNEE ARTHROPLASTY Right 08/11/2020   Procedure: TOTAL KNEE ARTHROPLASTY;  Surgeon: Melodi Lerner, MD;  Location: [facility];  Service: Orthopedics;  Laterality: Right;    TRANSURETHRAL RESECTION OF BLADDER TUMOR  2018   Family History  Problem Relation Age of Onset   Cerebral aneurysm Mother    Heart disease Father    Cancer Neg Hx     Current Outpatient Medications:    albuterol  (VENTOLIN  HFA) 108 (90 Base) MCG/ACT inhaler, Inhale 2 puffs into the lungs every 6 (six) hours as needed for wheezing or shortness of breath., Disp: 8 g, Rfl: 1   allopurinol  (ZYLOPRIM ) 100 MG tablet, TAKE 1 TABLET(100 MG) BY MOUTH TWICE DAILY, Disp: 180 tablet, Rfl: 3   amLODipine  (NORVASC ) 5 MG tablet, Take 1 tablet (5 mg total) by mouth daily., Disp: 30 tablet, Rfl: 2   ascorbic acid  (VITAMIN C ) 500 MG tablet, Take 500 mg by mouth daily., Disp: , Rfl:    aspirin  EC 81 MG tablet, Take 81 mg by mouth daily. Swallow whole., Disp: , Rfl:    carvedilol  (COREG ) 3.125 MG tablet, Take 1 tablet (3.125 mg total) by mouth 2 (two) times daily., Disp: 60 tablet, Rfl: 11   cholecalciferol (VITAMIN D3) 25 MCG (1000 UNIT) tablet, Take 1,000 Units by mouth daily., Disp: , Rfl:    citalopram  (CELEXA ) 20 MG tablet, TAKE 1 TABLET(20 MG) BY MOUTH DAILY, Disp: 90 tablet, Rfl: 3   Coenzyme Q10 (COQ10 PO), Take 1 capsule by mouth daily., Disp: ,  Rfl:    fluticasone  (FLONASE ) 50 MCG/ACT nasal spray, Place 1 spray into both nostrils daily., Disp: 1 mL, Rfl: 2   folic acid  (FOLVITE ) 800 MCG tablet, Take 800 mcg by mouth daily., Disp: , Rfl:    guaiFENesin  200 MG tablet, Take 1 tablet (200 mg total) by mouth every 4 (four) hours as needed for cough or to loosen phlegm., Disp: 90 suppository, Rfl: 0   HYDROcodone -Chlorpheniramine (CHLORPHENIRAMINE-HYDROCODONE  PO), Take 10 mg/mL by mouth every 12 (twelve) hours. chlorpheniramine- HYDROcodone  10-8 MG/ 5ML Commonly known as: TUSSIONEX Take 5 mLs by mouth every 12 (twelve) hours as needed for, Disp: , Rfl:    levothyroxine  (SYNTHROID ) 88 MCG tablet, TAKE 1 TABLET(88 MCG) BY MOUTH DAILY BEFORE BREAKFAST, Disp: 90 tablet, Rfl: 1   loratadine  (CLARITIN ) 10 MG tablet, Take 1 tablet (10 mg total) by mouth daily., Disp: 30 tablet, Rfl: 2   predniSONE  (DELTASONE ) 20 MG tablet, Take 2 tablets daily for 5 days then stop, Disp: 10 tablet, Rfl: 0  rosuvastatin  (CRESTOR ) 10 MG tablet, TAKE 1 TABLET(10 MG) BY MOUTH DAILY, Disp: 90 tablet, Rfl: 3 No Known Allergies   ROS: A complete ROS was performed with pertinent positives/negatives noted in the HPI. The remainder of the ROS are negative.    Objective:   Today's Vitals   05/03/24 0922  BP: 112/62  Pulse: (!) 54  Temp: 98.3 F (36.8 C)  TempSrc: Temporal  SpO2: 98%  Weight: 244 lb 12.8 oz (111 kg)  Height: 6' (1.829 m)    GENERAL: Well-appearing, in NAD. Well nourished.  SKIN: Pink, warm and dry.  NECK: Trachea midline. Full ROM w/o pain or tenderness. No lymphadenopathy.  RESPIRATORY: Chest wall symmetrical. Respirations even and non-labored. Breath sounds clear to auscultation bilaterally.  CARDIAC: S1, S2 present, regular rate and rhythm. Peripheral pulses 2+ bilaterally.  MSK: Muscle tone and strength appropriate for age. 4/5 weakness in BLE.  EXTREMITIES: Without clubbing, cyanosis. 1+ edema BLE PSYCH/MENTAL STATUS: Alert, oriented x 3.  Cooperative, appropriate mood and affect.   Health Maintenance Due  Topic Date Due   Medicare Annual Wellness (AWV)  12/03/2022    No results found for any visits on 05/03/24.  The 10-year ASCVD risk score (Arnett DK, et al., 2019) is: 27.8%     Assessment & Plan:  Assessment and Plan    Left lower lobe pneumonia Pneumonia treated with Rocephin  and azithromycin . COVID, flu, and RSV tests negative. Improved symptoms, no fever, mild exertional dyspnea. - chest x-ray for the week of September 30th to confirm pneumonia resolution. Order has already been placed by pulmonologist.  - Continue follow-up with pulmonology in November. - Check CBC  Weakness in bilateral lower extremities  Leg weakness likely due to recent hospitalization, back surgery, and sepsis history. Fall risk noted. - Refer to physical therapy at patient-requested location on [facility] for evaluation and strengthening exercises.  Essential hypertension Blood pressure well-controlled on Norvasc  and Coreg .  Bradycardia, asymptomatic, medication-related Bradycardia during hospitalization likely due to Coreg . Heart rate improved after dose reduction.    Acute Kidney Injury  - Resolved in hospital with IVF   Hypokalemia  - Given potassium supplement during hospitalization  - Recheck BMP today    Orders Placed This Encounter  Procedures   Basic Metabolic Panel (BMET)   CBC with Differential/Platelet   Ambulatory referral to Physical Therapy    Referral Priority:   Routine    Referral Type:   Physical Medicine    Referral Reason:   Specialty Services Required    Requested Specialty:   Physical Therapy    Number of Visits Requested:   1   No images are attached to the encounter or orders placed in the encounter. No orders of the defined types were placed in this encounter.   Return in about 4 months (around 09/02/2024) for Chronic Condition follow up with Dr. Thedora.   Rosina Senters, FNP

## 2024-05-09 ENCOUNTER — Ambulatory Visit: Payer: Self-pay | Admitting: Internal Medicine

## 2024-05-23 NOTE — Therapy (Incomplete)
 OUTPATIENT PHYSICAL THERAPY LOWER EXTREMITY EVALUATION   Patient Name: [name] MRN: [id number] DOB:06-22-1946, 78 y.o., male Today's Date: 05/23/2024  END OF SESSION:   Past Medical History:  Diagnosis Date   Arthritis    knees. back   Bladder cancer (HCC)    2018   High cholesterol    Hypertension    Hypothyroidism    prostate cancer    2000, 2003   Thyroid  disease    Past Surgical History:  Procedure Laterality Date   EYE SURGERY Bilateral 2014   HERNIA REPAIR Right 1984   inguinal   IR RADIOLOGIST EVAL & MGMT  03/19/2021   KNEE ARTHROSCOPY Left 1966   LUMBAR LAMINECTOMY/DECOMPRESSION MICRODISCECTOMY N/A 05/13/2022   Procedure: Microlumbar laminectomy Lumbar three-four , Lumbar four-five;  Surgeon: Duwayne Purchase, MD;  Location: [facility] OR;  Service: Orthopedics;  Laterality: N/A;   PENILE PROSTHESIS IMPLANT  2014   TOTAL KNEE ARTHROPLASTY Right 08/11/2020   Procedure: TOTAL KNEE ARTHROPLASTY;  Surgeon: Melodi Lerner, MD;  Location: [facility];  Service: Orthopedics;  Laterality: Right;    TRANSURETHRAL RESECTION OF BLADDER TUMOR  2018   Patient Active Problem List   Diagnosis Date Noted   Cough 04/12/2024   Bradycardia 04/12/2024   GERD (gastroesophageal reflux disease) 04/12/2024   Hyponatremia 04/11/2024   Mild aortic stenosis 04/11/2024   AKI (acute kidney injury) 04/09/2024   CAP (community acquired pneumonia) 04/08/2024   Pneumonia of right lower lobe due to infectious organism 01/31/2024   Allergic rhinitis 05/16/2023   Dermatitis- right elbow 05/16/2023   Peripheral polyneuropathy 09/22/2022   Tremor of right hand 09/22/2022   History of pyelonephritis 07/15/2022   Anxiety and depression 07/15/2022   Dyspnea on exertion 07/15/2022   Sepsis (HCC) 07/01/2022   Spinal stenosis at L4-L5 level 05/13/2022   Nephrolithiasis 02/27/2021   Aortic atherosclerosis 02/27/2021   Abscess of liver 02/25/2021   Prediabetes 11/18/2020   History of total knee  replacement, right 08/18/2020   Essential hypertension 01/11/2020   Onychomycosis 10/12/2019   Hypothyroidism 10/12/2019   Hyperlipidemia 10/12/2019   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  Rationale for Evaluation and Treatment: Rehabilitation  ONSET DATE: 04/08/24  SUBJECTIVE:   SUBJECTIVE STATEMENT: ***  PERTINENT HISTORY: He was admitted to the hospital on April 08, 2024, and discharged on April 14, 2024, for pneumonia in the left lower lobe. He was treated with Rocephin  and azithromycin  for five days. COVID, flu, and RSV tests were negative. During his hospital stay, he experienced an acute kidney injury, which resolved with IV fluids. He was also bradycardic and asymptomatic while taking Coreg  25 mg once daily, which was held during his hospital stay. He was discharged on Norvasc  5 mg daily and Coreg  3.125 mg twice daily.    Since discharge, he feels 'really good', although it took almost a week to feel like himself again. Initially, he worked half days until he regained more strength. He is experiencing weakness in his legs, which he attributes to being laid up since the hospitalization and over the past year and a half due to various health issues, including a back problem that required surgery and episodes of sepsis.  He also had an abscess in his chest that was initially suspected to be a tumor, which was drained, leading to improvement in his condition.  He reports shortness of breath upon exertion, but this is slowly improving. No fever. He is concerned about the weakness in his legs and the risk of falling, and he is interested in physical therapy  to improve his leg strength.   PAIN:  Are you having pain? {OPRCPAIN:27236}  PRECAUTIONS: {Therapy precautions:24002}  RED FLAGS: {PT Red Flags:29287}   WEIGHT BEARING RESTRICTIONS: {Yes ***/No:24003}  FALLS:  Has patient fallen in last 6 months? {fallsyesno:27318}  LIVING ENVIRONMENT: Lives with: {OPRC lives with:25569::lives with their family} Lives in: {Lives in:25570} Stairs: {opstairs:27293} Has following equipment at home: {Assistive devices:23999}  OCCUPATION: ***  PLOF: {PLOF:24004}  PATIENT GOALS: ***  NEXT MD VISIT: ***  OBJECTIVE:  Note: Objective measures were completed at Evaluation unless otherwise noted.  DIAGNOSTIC FINDINGS: ***  PATIENT SURVEYS:  {rehab surveys:24030}  COGNITION: Overall cognitive status: {cognition:24006}     SENSATION: {sensation:27233}  EDEMA:  {edema:24020}  MUSCLE LENGTH: Hamstrings: Right *** deg; Left *** deg Debby test: Right *** deg; Left *** deg  POSTURE: {posture:25561}  PALPATION: ***  LOWER EXTREMITY ROM:  {AROM/PROM:27142} ROM Right eval Left eval  Hip flexion    Hip extension    Hip abduction    Hip adduction    Hip internal rotation    Hip external rotation    Knee flexion    Knee extension    Ankle dorsiflexion    Ankle plantarflexion    Ankle inversion    Ankle eversion     (Blank rows = not tested)  LOWER EXTREMITY MMT:  MMT Right eval Left eval  Hip flexion    Hip extension    Hip abduction    Hip adduction    Hip internal rotation    Hip external rotation    Knee flexion    Knee extension    Ankle dorsiflexion    Ankle plantarflexion    Ankle inversion    Ankle eversion     (Blank rows = not tested)  LOWER EXTREMITY SPECIAL TESTS:  {LEspecialtests:26242}  FUNCTIONAL TESTS:  {Functional tests:24029}  GAIT: Distance walked: *** Assistive device utilized: {Assistive devices:23999} Level of assistance: {Levels of assistance:24026} Comments: ***                                                                                                                                 TREATMENT DATE: ***    PATIENT EDUCATION:  Education details: *** Person educated: {Person educated:25204} Education method: {Education Method:25205} Education comprehension: {Education Comprehension:25206}  HOME EXERCISE PROGRAM: ***  ASSESSMENT:  CLINICAL IMPRESSION: Patient is a *** y.o. *** who was seen today for physical therapy evaluation and treatment for ***.   OBJECTIVE IMPAIRMENTS: {opptimpairments:25111}.   ACTIVITY LIMITATIONS: {activitylimitations:27494}  PARTICIPATION LIMITATIONS: {participationrestrictions:25113}  PERSONAL FACTORS: {Personal factors:25162} are also affecting patient's functional  outcome.   REHAB POTENTIAL: {rehabpotential:25112}  CLINICAL DECISION MAKING: {clinical decision making:25114}  EVALUATION COMPLEXITY: {Evaluation complexity:25115}   GOALS: Goals reviewed with patient? {yes/no:20286}  SHORT TERM GOALS: Target date: *** *** Baseline: Goal status: INITIAL  2.  *** Baseline:  Goal status: INITIAL  3.  *** Baseline:  Goal status: INITIAL  4.  *** Baseline:  Goal status: INITIAL  5.  *** Baseline:  Goal status: INITIAL  6.  *** Baseline:  Goal status: INITIAL  LONG TERM GOALS: Target date: ***  *** Baseline:  Goal status: INITIAL  2.  *** Baseline:  Goal status: INITIAL  3.  *** Baseline:  Goal status: INITIAL  4.  *** Baseline:  Goal status: INITIAL  5.  *** Baseline:  Goal status: INITIAL  6.  *** Baseline:  Goal status: INITIAL   PLAN:  PT FREQUENCY: {rehab frequency:25116}  PT DURATION: {rehab duration:25117}  PLANNED INTERVENTIONS: {rehab planned interventions:25118::97110-Therapeutic exercises,97530- Therapeutic [phone]- Neuromuscular re-education,97535- Self Rjmz,02859- Manual therapy,Patient/Family education}  PLAN FOR NEXT SESSION: ***   Almetta Fam, PT 05/23/2024, 10:00 AM

## 2024-05-24 ENCOUNTER — Ambulatory Visit: Attending: Internal Medicine

## 2024-05-29 ENCOUNTER — Other Ambulatory Visit

## 2024-07-09 ENCOUNTER — Ambulatory Visit: Admitting: Internal Medicine

## 2024-07-26 ENCOUNTER — Other Ambulatory Visit: Payer: Self-pay | Admitting: Family

## 2024-07-26 DIAGNOSIS — M109 Gout, unspecified: Secondary | ICD-10-CM

## 2024-07-30 ENCOUNTER — Telehealth: Payer: Self-pay

## 2024-07-30 DIAGNOSIS — M109 Gout, unspecified: Secondary | ICD-10-CM

## 2024-07-30 MED ORDER — ALLOPURINOL 100 MG PO TABS: ORAL_TABLET | ORAL | 1 refills | Status: AC

## 2024-07-30 NOTE — Telephone Encounter (Signed)
 Refill request for Allopurinal LR  07/29/24, #180, 3 rf LOV  02/15/24 FOV  09/04/24  Please review and advise.  Thanks. Dm/cma

## 2024-09-03 NOTE — Assessment & Plan Note (Deleted)
 Lipids are at goal. Continue rosuvastatin 10 mg daily.

## 2024-09-03 NOTE — Assessment & Plan Note (Deleted)
Stable.  Continue levothyroxine 83mg daily

## 2024-09-03 NOTE — Progress Notes (Incomplete)
 " [facility] PRIMARY [facility]ARE [facility] [address] [place] [address] Dept: [phone] Dept Fax: [phone]  Chronic Care Office Visit  Subjective:    Patient ID: [name], male    DOB: 1946-07-22, 79 y.o..   MRN: [id number]  No chief complaint on file.  History of Present Illness:  Patient is in today for reassessment of chronic medical conditions.   [name] has a history of hypertension. He is managed on carvedilol  3.125 mg bid and amlodipine  5 mg daily.    [name] has a history of hypothyroidism. He is managed on levothyroxine  88 mcg daily.   [name] has a history of hyperlipidemia. He is managed on rosuvastatin  10 mg daily.   [name] has a history of a prior DVT. He is managed on a daily aspirin  81 mg.   [name] has a history of anxiety and depression. He is managed on citalopram  20 mg daily.    Past Medical History: Patient Active Problem List   Diagnosis Date Noted   Cough 04/12/2024   Bradycardia 04/12/2024   GERD (gastroesophageal reflux disease) 04/12/2024   Hyponatremia 04/11/2024   Mild aortic stenosis 04/11/2024   AKI (acute kidney injury) 04/09/2024   CAP (community acquired pneumonia) 04/08/2024   Pneumonia of right lower lobe due to infectious organism 01/31/2024   Allergic rhinitis 05/16/2023   Dermatitis- right elbow 05/16/2023   Peripheral polyneuropathy 09/22/2022   Tremor of right hand 09/22/2022   History of pyelonephritis 07/15/2022   Anxiety and depression 07/15/2022   Dyspnea on exertion 07/15/2022   Sepsis (HCC) 07/01/2022   Spinal stenosis at L4-L5 level 05/13/2022   Nephrolithiasis 02/27/2021   Aortic atherosclerosis 02/27/2021   Abscess of liver 02/25/2021   Prediabetes 11/18/2020   History of total knee replacement, right 08/18/2020   Essential hypertension 01/11/2020   Onychomycosis 10/12/2019   Hypothyroidism 10/12/2019   Hyperlipidemia 10/12/2019   Radiation cystitis  09/07/2019   Gout 05/03/2019   Personal history of prostate cancer 05/03/2019   Obesity (BMI 30.0-34.9) 04/26/2019   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   Past Surgical History:  Procedure Laterality Date   EYE SURGERY Bilateral 2014   HERNIA REPAIR Right 1984   inguinal   IR RADIOLOGIST EVAL & MGMT  03/19/2021   KNEE ARTHROSCOPY Left 1966   LUMBAR LAMINECTOMY/DECOMPRESSION MICRODISCECTOMY N/A 05/13/2022   Procedure: Microlumbar laminectomy Lumbar three-four , Lumbar four-five;  Surgeon: Duwayne Purchase, MD;  Location: [facility] OR;  Service: Orthopedics;  Laterality: N/A;   PENILE PROSTHESIS IMPLANT  2014   TOTAL KNEE ARTHROPLASTY Right 08/11/2020   Procedure: TOTAL KNEE ARTHROPLASTY;  Surgeon: Melodi Lerner, MD;  Location: [facility] ORS;  Service: Orthopedics;  Laterality: Right;    TRANSURETHRAL RESECTION OF BLADDER TUMOR  2018   Family History  Problem Relation Age of Onset   Cerebral aneurysm Mother    Heart disease Father    Cancer Neg Hx    Outpatient Medications Prior to Visit  Medication Sig Dispense Refill   albuterol  (VENTOLIN  HFA) 108 (90 Base) MCG/ACT inhaler Inhale 2 puffs into the lungs every 6 (six) hours as needed for wheezing or shortness of breath. 8 g 1   allopurinol  (ZYLOPRIM ) 100 MG tablet TAKE 1 TABLET(100 MG) BY MOUTH TWICE DAILY 180 tablet 1   amLODipine  (NORVASC ) 5 MG tablet Take 1 tablet (5 mg total) by mouth daily. 30 tablet 2   ascorbic acid  (VITAMIN C ) 500 MG tablet Take 500 mg by mouth  daily.     aspirin  EC 81 MG tablet Take 81 mg by mouth daily. Swallow whole.     carvedilol  (COREG ) 3.125 MG tablet Take 1 tablet (3.125 mg total) by mouth 2 (two) times daily. 60 tablet 11   cholecalciferol (VITAMIN D3) 25 MCG (1000 UNIT) tablet Take 1,000 Units by mouth daily.     citalopram  (CELEXA ) 20 MG tablet TAKE 1 TABLET(20 MG) BY MOUTH DAILY 90 tablet 3   Coenzyme Q10 (COQ10 PO) Take 1 capsule by mouth daily.     fluticasone  (FLONASE ) 50 MCG/ACT  nasal spray Place 1 spray into both nostrils daily. 1 mL 2   folic acid  (FOLVITE ) 800 MCG tablet Take 800 mcg by mouth daily.     guaiFENesin  200 MG tablet Take 1 tablet (200 mg total) by mouth every 4 (four) hours as needed for cough or to loosen phlegm. 90 suppository 0   HYDROcodone -Chlorpheniramine (CHLORPHENIRAMINE-HYDROCODONE  PO) Take 10 mg/mL by mouth every 12 (twelve) hours. chlorpheniramine- HYDROcodone  10-8 MG/ 5ML Commonly known as: TUSSIONEX Take 5 mLs by mouth every 12 (twelve) hours as needed for     levothyroxine  (SYNTHROID ) 88 MCG tablet TAKE 1 TABLET(88 MCG) BY MOUTH DAILY BEFORE BREAKFAST 90 tablet 1   loratadine  (CLARITIN ) 10 MG tablet Take 1 tablet (10 mg total) by mouth daily. 30 tablet 2   predniSONE  (DELTASONE ) 20 MG tablet Take 2 tablets daily for 5 days then stop 10 tablet 0   rosuvastatin  (CRESTOR ) 10 MG tablet TAKE 1 TABLET(10 MG) BY MOUTH DAILY 90 tablet 3   No facility-administered medications prior to visit.   Allergies[1]   Objective:   There were no vitals filed for this visit. There is no height or weight on file to calculate BMI.   General: Well developed, well nourished. No acute distress. HEENT: Normocephalic, non-traumatic. PERRL, EOMI. Conjunctiva clear. External ears normal. EAC and TMs normal bilaterally. Nose    clear without congestion or rhinorrhea. Mucous membranes moist. Oropharynx clear. Good dentition. Neck: Supple. No lymphadenopathy. No thyromegaly. Lungs: Clear to auscultation bilaterally. No wheezing, rales or rhonchi. CV: RRR without murmurs or rubs. Pulses 2+ bilaterally. Abdomen: Soft, non-tender. Bowel sounds positive, normal pitch and frequency. No hepatosplenomegaly. No rebound or guarding. Back: Straight. No CVA tenderness bilaterally. Extremities: Full ROM. No joint swelling or tenderness. No edema noted. Skin: Warm and dry. No rashes. Neuro: CN II-XII intact. Normal sensation and DTR bilaterally. Psych: Alert and oriented.  Normal mood and affect.  Health Maintenance Due  Topic Date Due   Medicare Annual Wellness (AWV)  12/03/2022   COVID-19 Vaccine (4 - 2025-26 season) 04/30/2024   Lab Results {Labs (Optional):29002}  Lipid Panel:  Lab Results  Component Value Date   CHOL 161 02/11/2023   HDL 39.40 02/11/2023   LDLCALC 84 02/11/2023   TRIG 189.0 (H) 02/11/2023      Lab Results  Component Value Date   TSH 2.42 09/30/2023   Assessment & Plan:   Problem List Items Addressed This Visit   None   No follow-ups on file.   Garnette CHRISTELLA Simpler, MD  I,Emily Lagle,acting as a scribe for Garnette CHRISTELLA Simpler, MD.,have documented all relevant documentation on the behalf of Garnette CHRISTELLA Simpler, MD.  I, Garnette CHRISTELLA Simpler, MD, have reviewed all documentation for this visit. The documentation on 09/04/2024 for the exam, diagnosis, procedures, and orders are all accurate and complete.    [1] No Known Allergies  "

## 2024-09-03 NOTE — Assessment & Plan Note (Deleted)
Blood pressure is in good control. Continue carvedilol 12.5 mg bid.

## 2024-09-04 ENCOUNTER — Ambulatory Visit: Admitting: Family Medicine

## 2024-09-04 DIAGNOSIS — Z87442 Personal history of urinary calculi: Secondary | ICD-10-CM | POA: Insufficient documentation

## 2024-09-04 DIAGNOSIS — E039 Hypothyroidism, unspecified: Secondary | ICD-10-CM

## 2024-09-04 DIAGNOSIS — E785 Hyperlipidemia, unspecified: Secondary | ICD-10-CM

## 2024-09-04 DIAGNOSIS — I10 Essential (primary) hypertension: Secondary | ICD-10-CM

## 2024-09-04 DIAGNOSIS — F32A Depression, unspecified: Secondary | ICD-10-CM

## 2024-10-01 NOTE — Assessment & Plan Note (Signed)
 Home citalopram  20 mg daily continued

## 2024-10-01 NOTE — Assessment & Plan Note (Signed)
 Lipids are at goal. Continue rosuvastatin 10 mg daily.

## 2024-10-01 NOTE — Assessment & Plan Note (Signed)
Stable.  Continue levothyroxine 83mg daily

## 2024-10-01 NOTE — Assessment & Plan Note (Addendum)
 Blood pressure is in good control. Continue carvedilol  12.5 mg bid and amlodipine  5 mg daily.

## 2024-10-02 ENCOUNTER — Ambulatory Visit: Admitting: Family Medicine
